# Patient Record
Sex: Male | Born: 1955
Health system: Southern US, Community
[De-identification: ages and names within clinical notes are randomized; demographics above are authoritative.]

## PROBLEM LIST (undated history)

## (undated) DIAGNOSIS — I5022 Chronic systolic (congestive) heart failure: Secondary | ICD-10-CM

## (undated) DIAGNOSIS — I252 Old myocardial infarction: Secondary | ICD-10-CM

## (undated) DIAGNOSIS — E785 Hyperlipidemia, unspecified: Secondary | ICD-10-CM

## (undated) DIAGNOSIS — I4891 Unspecified atrial fibrillation: Secondary | ICD-10-CM

## (undated) DIAGNOSIS — J449 Chronic obstructive pulmonary disease, unspecified: Secondary | ICD-10-CM

## (undated) DIAGNOSIS — E039 Hypothyroidism, unspecified: Secondary | ICD-10-CM

## (undated) DIAGNOSIS — I10 Essential (primary) hypertension: Secondary | ICD-10-CM

## (undated) DIAGNOSIS — I2510 Atherosclerotic heart disease of native coronary artery without angina pectoris: Secondary | ICD-10-CM

## (undated) DIAGNOSIS — Z95810 Presence of automatic (implantable) cardiac defibrillator: Secondary | ICD-10-CM

## (undated) DIAGNOSIS — G4733 Obstructive sleep apnea (adult) (pediatric): Secondary | ICD-10-CM

## (undated) DIAGNOSIS — I509 Heart failure, unspecified: Secondary | ICD-10-CM

## (undated) DIAGNOSIS — Z8701 Personal history of pneumonia (recurrent): Secondary | ICD-10-CM

## (undated) HISTORY — DX: Chronic systolic (congestive) heart failure: I50.22

## (undated) HISTORY — DX: Old myocardial infarction: I25.2

## (undated) HISTORY — DX: Atherosclerotic heart disease of native coronary artery without angina pectoris: I25.10

## (undated) HISTORY — DX: Personal history of pneumonia (recurrent): Z87.01

## (undated) HISTORY — DX: Obstructive sleep apnea (adult) (pediatric): G47.33

## (undated) HISTORY — DX: Hyperlipidemia, unspecified: E78.5

---

## 1982-08-16 HISTORY — PX: FACIAL RECONSTRUCTION SURGERY: SHX631

## 1982-08-16 HISTORY — PX: FEMUR FRACTURE SURGERY: SHX633

## 2012-08-16 DIAGNOSIS — Z95810 Presence of automatic (implantable) cardiac defibrillator: Secondary | ICD-10-CM

## 2012-08-16 HISTORY — DX: Presence of automatic (implantable) cardiac defibrillator: Z95.810

## 2012-08-16 HISTORY — PX: CARDIAC DEFIBRILLATOR PLACEMENT: SHX171

## 2013-08-16 HISTORY — PX: WRIST FRACTURE SURGERY: SHX121

## 2015-10-12 DIAGNOSIS — I10 Essential (primary) hypertension: Secondary | ICD-10-CM | POA: Diagnosis not present

## 2015-10-12 DIAGNOSIS — E039 Hypothyroidism, unspecified: Secondary | ICD-10-CM | POA: Diagnosis not present

## 2015-10-12 DIAGNOSIS — I2510 Atherosclerotic heart disease of native coronary artery without angina pectoris: Secondary | ICD-10-CM | POA: Diagnosis not present

## 2015-10-12 DIAGNOSIS — I509 Heart failure, unspecified: Secondary | ICD-10-CM | POA: Diagnosis not present

## 2015-10-13 DIAGNOSIS — E039 Hypothyroidism, unspecified: Secondary | ICD-10-CM | POA: Diagnosis not present

## 2015-10-13 DIAGNOSIS — I2510 Atherosclerotic heart disease of native coronary artery without angina pectoris: Secondary | ICD-10-CM | POA: Diagnosis not present

## 2015-10-13 DIAGNOSIS — I509 Heart failure, unspecified: Secondary | ICD-10-CM | POA: Diagnosis not present

## 2015-10-13 DIAGNOSIS — I10 Essential (primary) hypertension: Secondary | ICD-10-CM | POA: Diagnosis not present

## 2016-02-09 DIAGNOSIS — I10 Essential (primary) hypertension: Secondary | ICD-10-CM | POA: Diagnosis not present

## 2016-02-09 DIAGNOSIS — I255 Ischemic cardiomyopathy: Secondary | ICD-10-CM | POA: Diagnosis not present

## 2016-02-09 DIAGNOSIS — R0609 Other forms of dyspnea: Secondary | ICD-10-CM | POA: Diagnosis not present

## 2016-05-08 DIAGNOSIS — I255 Ischemic cardiomyopathy: Secondary | ICD-10-CM | POA: Diagnosis not present

## 2016-05-08 DIAGNOSIS — I2510 Atherosclerotic heart disease of native coronary artery without angina pectoris: Secondary | ICD-10-CM | POA: Diagnosis not present

## 2016-05-08 DIAGNOSIS — B182 Chronic viral hepatitis C: Secondary | ICD-10-CM | POA: Diagnosis present

## 2016-05-08 DIAGNOSIS — I10 Essential (primary) hypertension: Secondary | ICD-10-CM | POA: Diagnosis not present

## 2016-05-08 DIAGNOSIS — I371 Nonrheumatic pulmonary valve insufficiency: Secondary | ICD-10-CM | POA: Diagnosis not present

## 2016-05-08 DIAGNOSIS — Z95810 Presence of automatic (implantable) cardiac defibrillator: Secondary | ICD-10-CM | POA: Diagnosis not present

## 2016-05-08 DIAGNOSIS — Z8673 Personal history of transient ischemic attack (TIA), and cerebral infarction without residual deficits: Secondary | ICD-10-CM | POA: Diagnosis not present

## 2016-05-08 DIAGNOSIS — E039 Hypothyroidism, unspecified: Secondary | ICD-10-CM | POA: Diagnosis not present

## 2016-05-08 DIAGNOSIS — Z885 Allergy status to narcotic agent status: Secondary | ICD-10-CM | POA: Diagnosis not present

## 2016-05-08 DIAGNOSIS — I509 Heart failure, unspecified: Secondary | ICD-10-CM | POA: Diagnosis not present

## 2016-05-08 DIAGNOSIS — Z955 Presence of coronary angioplasty implant and graft: Secondary | ICD-10-CM | POA: Diagnosis not present

## 2016-05-08 DIAGNOSIS — I252 Old myocardial infarction: Secondary | ICD-10-CM | POA: Diagnosis not present

## 2016-05-08 DIAGNOSIS — I361 Nonrheumatic tricuspid (valve) insufficiency: Secondary | ICD-10-CM | POA: Diagnosis not present

## 2016-05-08 DIAGNOSIS — K219 Gastro-esophageal reflux disease without esophagitis: Secondary | ICD-10-CM | POA: Diagnosis not present

## 2016-05-08 DIAGNOSIS — Z87891 Personal history of nicotine dependence: Secondary | ICD-10-CM | POA: Diagnosis not present

## 2016-05-08 DIAGNOSIS — E785 Hyperlipidemia, unspecified: Secondary | ICD-10-CM | POA: Diagnosis not present

## 2016-05-08 DIAGNOSIS — I160 Hypertensive urgency: Secondary | ICD-10-CM | POA: Diagnosis not present

## 2016-05-08 DIAGNOSIS — I340 Nonrheumatic mitral (valve) insufficiency: Secondary | ICD-10-CM | POA: Diagnosis not present

## 2016-05-08 DIAGNOSIS — R0600 Dyspnea, unspecified: Secondary | ICD-10-CM | POA: Diagnosis not present

## 2016-05-08 DIAGNOSIS — I351 Nonrheumatic aortic (valve) insufficiency: Secondary | ICD-10-CM | POA: Diagnosis not present

## 2016-05-08 DIAGNOSIS — F419 Anxiety disorder, unspecified: Secondary | ICD-10-CM | POA: Diagnosis present

## 2016-05-08 DIAGNOSIS — F329 Major depressive disorder, single episode, unspecified: Secondary | ICD-10-CM | POA: Diagnosis present

## 2016-05-08 DIAGNOSIS — Z888 Allergy status to other drugs, medicaments and biological substances status: Secondary | ICD-10-CM | POA: Diagnosis not present

## 2016-05-08 DIAGNOSIS — I110 Hypertensive heart disease with heart failure: Secondary | ICD-10-CM | POA: Diagnosis not present

## 2016-05-08 DIAGNOSIS — I952 Hypotension due to drugs: Secondary | ICD-10-CM | POA: Diagnosis not present

## 2016-05-08 DIAGNOSIS — T447X5A Adverse effect of beta-adrenoreceptor antagonists, initial encounter: Secondary | ICD-10-CM | POA: Diagnosis not present

## 2016-05-08 DIAGNOSIS — I5023 Acute on chronic systolic (congestive) heart failure: Secondary | ICD-10-CM | POA: Diagnosis not present

## 2016-05-17 DIAGNOSIS — I5020 Unspecified systolic (congestive) heart failure: Secondary | ICD-10-CM | POA: Diagnosis not present

## 2016-05-17 DIAGNOSIS — I10 Essential (primary) hypertension: Secondary | ICD-10-CM | POA: Diagnosis not present

## 2016-05-17 DIAGNOSIS — Z955 Presence of coronary angioplasty implant and graft: Secondary | ICD-10-CM | POA: Diagnosis not present

## 2016-05-17 DIAGNOSIS — I255 Ischemic cardiomyopathy: Secondary | ICD-10-CM | POA: Diagnosis not present

## 2016-05-26 DIAGNOSIS — I10 Essential (primary) hypertension: Secondary | ICD-10-CM | POA: Diagnosis not present

## 2016-05-26 DIAGNOSIS — I5020 Unspecified systolic (congestive) heart failure: Secondary | ICD-10-CM | POA: Diagnosis not present

## 2016-05-26 DIAGNOSIS — I255 Ischemic cardiomyopathy: Secondary | ICD-10-CM | POA: Diagnosis not present

## 2016-05-26 DIAGNOSIS — Z955 Presence of coronary angioplasty implant and graft: Secondary | ICD-10-CM | POA: Diagnosis not present

## 2016-06-02 DIAGNOSIS — I5020 Unspecified systolic (congestive) heart failure: Secondary | ICD-10-CM | POA: Diagnosis not present

## 2016-06-02 DIAGNOSIS — I2510 Atherosclerotic heart disease of native coronary artery without angina pectoris: Secondary | ICD-10-CM | POA: Diagnosis not present

## 2016-06-02 DIAGNOSIS — I10 Essential (primary) hypertension: Secondary | ICD-10-CM | POA: Diagnosis not present

## 2016-06-02 DIAGNOSIS — I255 Ischemic cardiomyopathy: Secondary | ICD-10-CM | POA: Diagnosis not present

## 2016-06-10 DIAGNOSIS — Z955 Presence of coronary angioplasty implant and graft: Secondary | ICD-10-CM | POA: Diagnosis not present

## 2016-06-10 DIAGNOSIS — I5020 Unspecified systolic (congestive) heart failure: Secondary | ICD-10-CM | POA: Diagnosis not present

## 2016-06-10 DIAGNOSIS — I10 Essential (primary) hypertension: Secondary | ICD-10-CM | POA: Diagnosis not present

## 2016-06-10 DIAGNOSIS — I255 Ischemic cardiomyopathy: Secondary | ICD-10-CM | POA: Diagnosis not present

## 2016-06-16 DIAGNOSIS — E782 Mixed hyperlipidemia: Secondary | ICD-10-CM | POA: Diagnosis not present

## 2016-06-16 DIAGNOSIS — I255 Ischemic cardiomyopathy: Secondary | ICD-10-CM | POA: Diagnosis not present

## 2016-06-16 DIAGNOSIS — I5020 Unspecified systolic (congestive) heart failure: Secondary | ICD-10-CM | POA: Diagnosis not present

## 2016-06-16 DIAGNOSIS — I10 Essential (primary) hypertension: Secondary | ICD-10-CM | POA: Diagnosis not present

## 2016-07-03 DIAGNOSIS — Z95810 Presence of automatic (implantable) cardiac defibrillator: Secondary | ICD-10-CM | POA: Diagnosis not present

## 2016-07-03 DIAGNOSIS — R5381 Other malaise: Secondary | ICD-10-CM | POA: Diagnosis present

## 2016-07-03 DIAGNOSIS — I255 Ischemic cardiomyopathy: Secondary | ICD-10-CM | POA: Diagnosis present

## 2016-07-03 DIAGNOSIS — E785 Hyperlipidemia, unspecified: Secondary | ICD-10-CM | POA: Diagnosis present

## 2016-07-03 DIAGNOSIS — Z9889 Other specified postprocedural states: Secondary | ICD-10-CM | POA: Diagnosis not present

## 2016-07-03 DIAGNOSIS — I10 Essential (primary) hypertension: Secondary | ICD-10-CM | POA: Diagnosis not present

## 2016-07-03 DIAGNOSIS — I455 Other specified heart block: Secondary | ICD-10-CM | POA: Diagnosis not present

## 2016-07-03 DIAGNOSIS — J9601 Acute respiratory failure with hypoxia: Secondary | ICD-10-CM | POA: Diagnosis not present

## 2016-07-03 DIAGNOSIS — I160 Hypertensive urgency: Secondary | ICD-10-CM | POA: Diagnosis present

## 2016-07-03 DIAGNOSIS — Z885 Allergy status to narcotic agent status: Secondary | ICD-10-CM | POA: Diagnosis not present

## 2016-07-03 DIAGNOSIS — N179 Acute kidney failure, unspecified: Secondary | ICD-10-CM | POA: Diagnosis present

## 2016-07-03 DIAGNOSIS — R531 Weakness: Secondary | ICD-10-CM | POA: Diagnosis not present

## 2016-07-03 DIAGNOSIS — I2510 Atherosclerotic heart disease of native coronary artery without angina pectoris: Secondary | ICD-10-CM | POA: Diagnosis present

## 2016-07-03 DIAGNOSIS — E46 Unspecified protein-calorie malnutrition: Secondary | ICD-10-CM | POA: Diagnosis present

## 2016-07-03 DIAGNOSIS — E875 Hyperkalemia: Secondary | ICD-10-CM | POA: Diagnosis not present

## 2016-07-03 DIAGNOSIS — Z6827 Body mass index (BMI) 27.0-27.9, adult: Secondary | ICD-10-CM | POA: Diagnosis not present

## 2016-07-03 DIAGNOSIS — F419 Anxiety disorder, unspecified: Secondary | ICD-10-CM | POA: Diagnosis present

## 2016-07-03 DIAGNOSIS — E876 Hypokalemia: Secondary | ICD-10-CM | POA: Diagnosis not present

## 2016-07-03 DIAGNOSIS — I5020 Unspecified systolic (congestive) heart failure: Secondary | ICD-10-CM | POA: Diagnosis not present

## 2016-07-03 DIAGNOSIS — E871 Hypo-osmolality and hyponatremia: Secondary | ICD-10-CM | POA: Diagnosis present

## 2016-07-03 DIAGNOSIS — I5021 Acute systolic (congestive) heart failure: Secondary | ICD-10-CM | POA: Diagnosis not present

## 2016-07-03 DIAGNOSIS — I130 Hypertensive heart and chronic kidney disease with heart failure and stage 1 through stage 4 chronic kidney disease, or unspecified chronic kidney disease: Secondary | ICD-10-CM | POA: Diagnosis present

## 2016-07-03 DIAGNOSIS — I252 Old myocardial infarction: Secondary | ICD-10-CM | POA: Diagnosis not present

## 2016-07-03 DIAGNOSIS — F329 Major depressive disorder, single episode, unspecified: Secondary | ICD-10-CM | POA: Diagnosis present

## 2016-07-03 DIAGNOSIS — K219 Gastro-esophageal reflux disease without esophagitis: Secondary | ICD-10-CM | POA: Diagnosis present

## 2016-07-03 DIAGNOSIS — N183 Chronic kidney disease, stage 3 (moderate): Secondary | ICD-10-CM | POA: Diagnosis present

## 2016-07-03 DIAGNOSIS — B182 Chronic viral hepatitis C: Secondary | ICD-10-CM | POA: Diagnosis present

## 2016-07-03 DIAGNOSIS — E039 Hypothyroidism, unspecified: Secondary | ICD-10-CM | POA: Diagnosis present

## 2016-07-03 DIAGNOSIS — Z87891 Personal history of nicotine dependence: Secondary | ICD-10-CM | POA: Diagnosis not present

## 2016-07-03 DIAGNOSIS — R079 Chest pain, unspecified: Secondary | ICD-10-CM | POA: Diagnosis not present

## 2016-07-03 DIAGNOSIS — I5023 Acute on chronic systolic (congestive) heart failure: Secondary | ICD-10-CM | POA: Diagnosis not present

## 2016-07-03 DIAGNOSIS — E872 Acidosis: Secondary | ICD-10-CM | POA: Diagnosis not present

## 2016-07-16 DIAGNOSIS — I255 Ischemic cardiomyopathy: Secondary | ICD-10-CM | POA: Diagnosis not present

## 2016-07-16 DIAGNOSIS — I2510 Atherosclerotic heart disease of native coronary artery without angina pectoris: Secondary | ICD-10-CM | POA: Diagnosis not present

## 2016-07-16 DIAGNOSIS — I5020 Unspecified systolic (congestive) heart failure: Secondary | ICD-10-CM | POA: Diagnosis not present

## 2016-07-16 DIAGNOSIS — I10 Essential (primary) hypertension: Secondary | ICD-10-CM | POA: Diagnosis not present

## 2016-07-28 DIAGNOSIS — I255 Ischemic cardiomyopathy: Secondary | ICD-10-CM | POA: Diagnosis not present

## 2016-07-28 DIAGNOSIS — I2510 Atherosclerotic heart disease of native coronary artery without angina pectoris: Secondary | ICD-10-CM | POA: Diagnosis not present

## 2016-07-28 DIAGNOSIS — I10 Essential (primary) hypertension: Secondary | ICD-10-CM | POA: Diagnosis not present

## 2016-08-27 DIAGNOSIS — I255 Ischemic cardiomyopathy: Secondary | ICD-10-CM | POA: Diagnosis not present

## 2016-08-27 DIAGNOSIS — I10 Essential (primary) hypertension: Secondary | ICD-10-CM | POA: Diagnosis not present

## 2016-08-27 DIAGNOSIS — I5020 Unspecified systolic (congestive) heart failure: Secondary | ICD-10-CM | POA: Diagnosis not present

## 2016-10-10 DIAGNOSIS — E785 Hyperlipidemia, unspecified: Secondary | ICD-10-CM | POA: Diagnosis not present

## 2016-10-10 DIAGNOSIS — Z955 Presence of coronary angioplasty implant and graft: Secondary | ICD-10-CM | POA: Diagnosis not present

## 2016-10-10 DIAGNOSIS — I110 Hypertensive heart disease with heart failure: Secondary | ICD-10-CM | POA: Diagnosis present

## 2016-10-10 DIAGNOSIS — Z87898 Personal history of other specified conditions: Secondary | ICD-10-CM | POA: Diagnosis not present

## 2016-10-10 DIAGNOSIS — Z9889 Other specified postprocedural states: Secondary | ICD-10-CM | POA: Diagnosis not present

## 2016-10-10 DIAGNOSIS — I255 Ischemic cardiomyopathy: Secondary | ICD-10-CM | POA: Diagnosis present

## 2016-10-10 DIAGNOSIS — I509 Heart failure, unspecified: Secondary | ICD-10-CM | POA: Diagnosis not present

## 2016-10-10 DIAGNOSIS — F329 Major depressive disorder, single episode, unspecified: Secondary | ICD-10-CM | POA: Diagnosis not present

## 2016-10-10 DIAGNOSIS — I10 Essential (primary) hypertension: Secondary | ICD-10-CM | POA: Diagnosis not present

## 2016-10-10 DIAGNOSIS — E872 Acidosis: Secondary | ICD-10-CM | POA: Diagnosis not present

## 2016-10-10 DIAGNOSIS — D1809 Hemangioma of other sites: Secondary | ICD-10-CM | POA: Diagnosis present

## 2016-10-10 DIAGNOSIS — R0602 Shortness of breath: Secondary | ICD-10-CM | POA: Diagnosis not present

## 2016-10-10 DIAGNOSIS — I2510 Atherosclerotic heart disease of native coronary artery without angina pectoris: Secondary | ICD-10-CM | POA: Diagnosis not present

## 2016-10-10 DIAGNOSIS — Z885 Allergy status to narcotic agent status: Secondary | ICD-10-CM | POA: Diagnosis not present

## 2016-10-10 DIAGNOSIS — K219 Gastro-esophageal reflux disease without esophagitis: Secondary | ICD-10-CM | POA: Diagnosis present

## 2016-10-10 DIAGNOSIS — Z87891 Personal history of nicotine dependence: Secondary | ICD-10-CM | POA: Diagnosis not present

## 2016-10-10 DIAGNOSIS — Z950 Presence of cardiac pacemaker: Secondary | ICD-10-CM | POA: Diagnosis not present

## 2016-10-10 DIAGNOSIS — Z9114 Patient's other noncompliance with medication regimen: Secondary | ICD-10-CM | POA: Diagnosis not present

## 2016-10-10 DIAGNOSIS — B182 Chronic viral hepatitis C: Secondary | ICD-10-CM | POA: Diagnosis present

## 2016-10-10 DIAGNOSIS — F419 Anxiety disorder, unspecified: Secondary | ICD-10-CM | POA: Diagnosis present

## 2016-10-10 DIAGNOSIS — E039 Hypothyroidism, unspecified: Secondary | ICD-10-CM | POA: Diagnosis present

## 2016-10-10 DIAGNOSIS — I5023 Acute on chronic systolic (congestive) heart failure: Secondary | ICD-10-CM | POA: Diagnosis not present

## 2016-10-22 DIAGNOSIS — I255 Ischemic cardiomyopathy: Secondary | ICD-10-CM | POA: Diagnosis not present

## 2016-10-22 DIAGNOSIS — I5020 Unspecified systolic (congestive) heart failure: Secondary | ICD-10-CM | POA: Diagnosis not present

## 2016-10-22 DIAGNOSIS — I440 Atrioventricular block, first degree: Secondary | ICD-10-CM | POA: Diagnosis not present

## 2016-10-22 DIAGNOSIS — R9431 Abnormal electrocardiogram [ECG] [EKG]: Secondary | ICD-10-CM | POA: Diagnosis not present

## 2016-10-22 DIAGNOSIS — I10 Essential (primary) hypertension: Secondary | ICD-10-CM | POA: Diagnosis not present

## 2017-01-07 DIAGNOSIS — Z7982 Long term (current) use of aspirin: Secondary | ICD-10-CM | POA: Diagnosis not present

## 2017-01-07 DIAGNOSIS — Z95810 Presence of automatic (implantable) cardiac defibrillator: Secondary | ICD-10-CM | POA: Diagnosis not present

## 2017-01-07 DIAGNOSIS — Z886 Allergy status to analgesic agent status: Secondary | ICD-10-CM | POA: Diagnosis not present

## 2017-01-07 DIAGNOSIS — I5020 Unspecified systolic (congestive) heart failure: Secondary | ICD-10-CM | POA: Diagnosis not present

## 2017-01-07 DIAGNOSIS — Z9119 Patient's noncompliance with other medical treatment and regimen: Secondary | ICD-10-CM | POA: Diagnosis not present

## 2017-01-07 DIAGNOSIS — E785 Hyperlipidemia, unspecified: Secondary | ICD-10-CM | POA: Diagnosis not present

## 2017-01-07 DIAGNOSIS — I5021 Acute systolic (congestive) heart failure: Secondary | ICD-10-CM | POA: Diagnosis not present

## 2017-01-07 DIAGNOSIS — I255 Ischemic cardiomyopathy: Secondary | ICD-10-CM | POA: Diagnosis not present

## 2017-01-07 DIAGNOSIS — K219 Gastro-esophageal reflux disease without esophagitis: Secondary | ICD-10-CM | POA: Diagnosis not present

## 2017-01-07 DIAGNOSIS — I110 Hypertensive heart disease with heart failure: Secondary | ICD-10-CM | POA: Diagnosis not present

## 2017-01-07 DIAGNOSIS — E872 Acidosis: Secondary | ICD-10-CM | POA: Diagnosis not present

## 2017-01-07 DIAGNOSIS — R0602 Shortness of breath: Secondary | ICD-10-CM | POA: Diagnosis not present

## 2017-01-07 DIAGNOSIS — J9621 Acute and chronic respiratory failure with hypoxia: Secondary | ICD-10-CM | POA: Diagnosis not present

## 2017-01-07 DIAGNOSIS — Z79899 Other long term (current) drug therapy: Secondary | ICD-10-CM | POA: Diagnosis not present

## 2017-01-07 DIAGNOSIS — I2510 Atherosclerotic heart disease of native coronary artery without angina pectoris: Secondary | ICD-10-CM | POA: Diagnosis not present

## 2017-01-07 DIAGNOSIS — E039 Hypothyroidism, unspecified: Secondary | ICD-10-CM | POA: Diagnosis not present

## 2017-01-07 DIAGNOSIS — Z8619 Personal history of other infectious and parasitic diseases: Secondary | ICD-10-CM | POA: Diagnosis not present

## 2017-01-07 DIAGNOSIS — Z87891 Personal history of nicotine dependence: Secondary | ICD-10-CM | POA: Diagnosis not present

## 2017-01-07 DIAGNOSIS — R0600 Dyspnea, unspecified: Secondary | ICD-10-CM | POA: Diagnosis not present

## 2017-01-07 DIAGNOSIS — N179 Acute kidney failure, unspecified: Secondary | ICD-10-CM | POA: Diagnosis not present

## 2017-01-08 DIAGNOSIS — I5020 Unspecified systolic (congestive) heart failure: Secondary | ICD-10-CM | POA: Diagnosis not present

## 2017-01-08 DIAGNOSIS — R0600 Dyspnea, unspecified: Secondary | ICD-10-CM | POA: Diagnosis not present

## 2017-01-08 DIAGNOSIS — I255 Ischemic cardiomyopathy: Secondary | ICD-10-CM | POA: Diagnosis not present

## 2017-01-11 DIAGNOSIS — E860 Dehydration: Secondary | ICD-10-CM | POA: Diagnosis not present

## 2017-01-11 DIAGNOSIS — F329 Major depressive disorder, single episode, unspecified: Secondary | ICD-10-CM | POA: Diagnosis present

## 2017-01-11 DIAGNOSIS — I252 Old myocardial infarction: Secondary | ICD-10-CM | POA: Diagnosis not present

## 2017-01-11 DIAGNOSIS — N289 Disorder of kidney and ureter, unspecified: Secondary | ICD-10-CM | POA: Diagnosis not present

## 2017-01-11 DIAGNOSIS — I5023 Acute on chronic systolic (congestive) heart failure: Secondary | ICD-10-CM | POA: Diagnosis not present

## 2017-01-11 DIAGNOSIS — R262 Difficulty in walking, not elsewhere classified: Secondary | ICD-10-CM | POA: Diagnosis not present

## 2017-01-11 DIAGNOSIS — E875 Hyperkalemia: Secondary | ICD-10-CM | POA: Diagnosis not present

## 2017-01-11 DIAGNOSIS — N179 Acute kidney failure, unspecified: Secondary | ICD-10-CM | POA: Diagnosis present

## 2017-01-11 DIAGNOSIS — Z886 Allergy status to analgesic agent status: Secondary | ICD-10-CM | POA: Diagnosis not present

## 2017-01-11 DIAGNOSIS — I2510 Atherosclerotic heart disease of native coronary artery without angina pectoris: Secondary | ICD-10-CM | POA: Diagnosis not present

## 2017-01-11 DIAGNOSIS — E162 Hypoglycemia, unspecified: Secondary | ICD-10-CM | POA: Diagnosis not present

## 2017-01-11 DIAGNOSIS — J440 Chronic obstructive pulmonary disease with acute lower respiratory infection: Secondary | ICD-10-CM | POA: Diagnosis not present

## 2017-01-11 DIAGNOSIS — R0602 Shortness of breath: Secondary | ICD-10-CM | POA: Diagnosis not present

## 2017-01-11 DIAGNOSIS — I10 Essential (primary) hypertension: Secondary | ICD-10-CM | POA: Diagnosis not present

## 2017-01-11 DIAGNOSIS — E872 Acidosis: Secondary | ICD-10-CM | POA: Diagnosis not present

## 2017-01-11 DIAGNOSIS — R0902 Hypoxemia: Secondary | ICD-10-CM | POA: Diagnosis present

## 2017-01-11 DIAGNOSIS — Z7982 Long term (current) use of aspirin: Secondary | ICD-10-CM | POA: Diagnosis not present

## 2017-01-11 DIAGNOSIS — Z955 Presence of coronary angioplasty implant and graft: Secondary | ICD-10-CM | POA: Diagnosis not present

## 2017-01-11 DIAGNOSIS — J209 Acute bronchitis, unspecified: Secondary | ICD-10-CM | POA: Diagnosis not present

## 2017-01-11 DIAGNOSIS — M6281 Muscle weakness (generalized): Secondary | ICD-10-CM | POA: Diagnosis not present

## 2017-01-11 DIAGNOSIS — E7800 Pure hypercholesterolemia, unspecified: Secondary | ICD-10-CM | POA: Diagnosis present

## 2017-01-11 DIAGNOSIS — I5021 Acute systolic (congestive) heart failure: Secondary | ICD-10-CM | POA: Diagnosis present

## 2017-01-11 DIAGNOSIS — K219 Gastro-esophageal reflux disease without esophagitis: Secondary | ICD-10-CM | POA: Diagnosis present

## 2017-01-11 DIAGNOSIS — Z950 Presence of cardiac pacemaker: Secondary | ICD-10-CM | POA: Diagnosis not present

## 2017-01-11 DIAGNOSIS — E876 Hypokalemia: Secondary | ICD-10-CM | POA: Diagnosis not present

## 2017-01-11 DIAGNOSIS — J449 Chronic obstructive pulmonary disease, unspecified: Secondary | ICD-10-CM | POA: Diagnosis present

## 2017-01-11 DIAGNOSIS — Z7902 Long term (current) use of antithrombotics/antiplatelets: Secondary | ICD-10-CM | POA: Diagnosis not present

## 2017-01-11 DIAGNOSIS — R740 Nonspecific elevation of levels of transaminase and lactic acid dehydrogenase [LDH]: Secondary | ICD-10-CM | POA: Diagnosis not present

## 2017-01-11 DIAGNOSIS — R748 Abnormal levels of other serum enzymes: Secondary | ICD-10-CM | POA: Diagnosis present

## 2017-01-11 DIAGNOSIS — S52601A Unspecified fracture of lower end of right ulna, initial encounter for closed fracture: Secondary | ICD-10-CM | POA: Diagnosis not present

## 2017-01-11 DIAGNOSIS — I209 Angina pectoris, unspecified: Secondary | ICD-10-CM | POA: Diagnosis not present

## 2017-01-11 DIAGNOSIS — E539 Vitamin B deficiency, unspecified: Secondary | ICD-10-CM | POA: Diagnosis not present

## 2017-01-11 DIAGNOSIS — R531 Weakness: Secondary | ICD-10-CM | POA: Diagnosis not present

## 2017-01-11 DIAGNOSIS — D649 Anemia, unspecified: Secondary | ICD-10-CM | POA: Diagnosis present

## 2017-01-11 DIAGNOSIS — R0682 Tachypnea, not elsewhere classified: Secondary | ICD-10-CM | POA: Diagnosis not present

## 2017-01-11 DIAGNOSIS — F339 Major depressive disorder, recurrent, unspecified: Secondary | ICD-10-CM | POA: Diagnosis not present

## 2017-01-11 DIAGNOSIS — Z87891 Personal history of nicotine dependence: Secondary | ICD-10-CM | POA: Diagnosis not present

## 2017-01-11 DIAGNOSIS — F419 Anxiety disorder, unspecified: Secondary | ICD-10-CM | POA: Diagnosis present

## 2017-01-11 DIAGNOSIS — Z79899 Other long term (current) drug therapy: Secondary | ICD-10-CM | POA: Diagnosis not present

## 2017-01-11 DIAGNOSIS — E039 Hypothyroidism, unspecified: Secondary | ICD-10-CM | POA: Diagnosis present

## 2017-01-14 DIAGNOSIS — R079 Chest pain, unspecified: Secondary | ICD-10-CM | POA: Diagnosis not present

## 2017-01-14 DIAGNOSIS — I110 Hypertensive heart disease with heart failure: Secondary | ICD-10-CM | POA: Diagnosis present

## 2017-01-14 DIAGNOSIS — R109 Unspecified abdominal pain: Secondary | ICD-10-CM | POA: Diagnosis present

## 2017-01-14 DIAGNOSIS — Z885 Allergy status to narcotic agent status: Secondary | ICD-10-CM | POA: Diagnosis not present

## 2017-01-14 DIAGNOSIS — R1084 Generalized abdominal pain: Secondary | ICD-10-CM | POA: Diagnosis not present

## 2017-01-14 DIAGNOSIS — F419 Anxiety disorder, unspecified: Secondary | ICD-10-CM | POA: Diagnosis not present

## 2017-01-14 DIAGNOSIS — E038 Other specified hypothyroidism: Secondary | ICD-10-CM | POA: Diagnosis not present

## 2017-01-14 DIAGNOSIS — I209 Angina pectoris, unspecified: Secondary | ICD-10-CM | POA: Diagnosis not present

## 2017-01-14 DIAGNOSIS — Z87891 Personal history of nicotine dependence: Secondary | ICD-10-CM | POA: Diagnosis not present

## 2017-01-14 DIAGNOSIS — Z7982 Long term (current) use of aspirin: Secondary | ICD-10-CM | POA: Diagnosis not present

## 2017-01-14 DIAGNOSIS — E039 Hypothyroidism, unspecified: Secondary | ICD-10-CM | POA: Diagnosis present

## 2017-01-14 DIAGNOSIS — R262 Difficulty in walking, not elsewhere classified: Secondary | ICD-10-CM | POA: Diagnosis not present

## 2017-01-14 DIAGNOSIS — I10 Essential (primary) hypertension: Secondary | ICD-10-CM | POA: Diagnosis not present

## 2017-01-14 DIAGNOSIS — I255 Ischemic cardiomyopathy: Secondary | ICD-10-CM | POA: Diagnosis present

## 2017-01-14 DIAGNOSIS — I2510 Atherosclerotic heart disease of native coronary artery without angina pectoris: Secondary | ICD-10-CM | POA: Diagnosis present

## 2017-01-14 DIAGNOSIS — I509 Heart failure, unspecified: Secondary | ICD-10-CM | POA: Diagnosis not present

## 2017-01-14 DIAGNOSIS — I440 Atrioventricular block, first degree: Secondary | ICD-10-CM | POA: Diagnosis present

## 2017-01-14 DIAGNOSIS — J449 Chronic obstructive pulmonary disease, unspecified: Secondary | ICD-10-CM | POA: Diagnosis not present

## 2017-01-14 DIAGNOSIS — E539 Vitamin B deficiency, unspecified: Secondary | ICD-10-CM | POA: Diagnosis not present

## 2017-01-14 DIAGNOSIS — E876 Hypokalemia: Secondary | ICD-10-CM | POA: Diagnosis not present

## 2017-01-14 DIAGNOSIS — F339 Major depressive disorder, recurrent, unspecified: Secondary | ICD-10-CM | POA: Diagnosis not present

## 2017-01-14 DIAGNOSIS — S52601A Unspecified fracture of lower end of right ulna, initial encounter for closed fracture: Secondary | ICD-10-CM | POA: Diagnosis not present

## 2017-01-14 DIAGNOSIS — I5021 Acute systolic (congestive) heart failure: Secondary | ICD-10-CM | POA: Diagnosis not present

## 2017-01-14 DIAGNOSIS — J209 Acute bronchitis, unspecified: Secondary | ICD-10-CM | POA: Diagnosis not present

## 2017-01-14 DIAGNOSIS — Z950 Presence of cardiac pacemaker: Secondary | ICD-10-CM | POA: Diagnosis not present

## 2017-01-14 DIAGNOSIS — K219 Gastro-esophageal reflux disease without esophagitis: Secondary | ICD-10-CM | POA: Diagnosis present

## 2017-01-14 DIAGNOSIS — Z9119 Patient's noncompliance with other medical treatment and regimen: Secondary | ICD-10-CM | POA: Diagnosis not present

## 2017-01-14 DIAGNOSIS — I5023 Acute on chronic systolic (congestive) heart failure: Secondary | ICD-10-CM | POA: Diagnosis present

## 2017-01-14 DIAGNOSIS — D638 Anemia in other chronic diseases classified elsewhere: Secondary | ICD-10-CM | POA: Diagnosis present

## 2017-01-14 DIAGNOSIS — R0902 Hypoxemia: Secondary | ICD-10-CM | POA: Diagnosis not present

## 2017-01-14 DIAGNOSIS — R072 Precordial pain: Secondary | ICD-10-CM | POA: Diagnosis not present

## 2017-01-14 DIAGNOSIS — M6281 Muscle weakness (generalized): Secondary | ICD-10-CM | POA: Diagnosis not present

## 2017-01-14 DIAGNOSIS — Z8619 Personal history of other infectious and parasitic diseases: Secondary | ICD-10-CM | POA: Diagnosis not present

## 2017-01-14 DIAGNOSIS — R531 Weakness: Secondary | ICD-10-CM | POA: Diagnosis present

## 2017-01-14 DIAGNOSIS — J9621 Acute and chronic respiratory failure with hypoxia: Secondary | ICD-10-CM | POA: Diagnosis present

## 2017-01-14 DIAGNOSIS — E162 Hypoglycemia, unspecified: Secondary | ICD-10-CM | POA: Diagnosis present

## 2017-01-14 DIAGNOSIS — Z95810 Presence of automatic (implantable) cardiac defibrillator: Secondary | ICD-10-CM | POA: Diagnosis not present

## 2017-01-14 DIAGNOSIS — E872 Acidosis: Secondary | ICD-10-CM | POA: Diagnosis present

## 2017-01-14 DIAGNOSIS — R0600 Dyspnea, unspecified: Secondary | ICD-10-CM | POA: Diagnosis not present

## 2017-01-14 DIAGNOSIS — E871 Hypo-osmolality and hyponatremia: Secondary | ICD-10-CM | POA: Diagnosis not present

## 2017-01-14 DIAGNOSIS — E063 Autoimmune thyroiditis: Secondary | ICD-10-CM | POA: Diagnosis not present

## 2017-01-14 DIAGNOSIS — E785 Hyperlipidemia, unspecified: Secondary | ICD-10-CM | POA: Diagnosis present

## 2017-01-14 DIAGNOSIS — J440 Chronic obstructive pulmonary disease with acute lower respiratory infection: Secondary | ICD-10-CM | POA: Diagnosis not present

## 2017-01-14 DIAGNOSIS — R740 Nonspecific elevation of levels of transaminase and lactic acid dehydrogenase [LDH]: Secondary | ICD-10-CM | POA: Diagnosis present

## 2017-01-17 DIAGNOSIS — E038 Other specified hypothyroidism: Secondary | ICD-10-CM | POA: Diagnosis not present

## 2017-01-17 DIAGNOSIS — E063 Autoimmune thyroiditis: Secondary | ICD-10-CM | POA: Diagnosis not present

## 2017-01-17 DIAGNOSIS — I5023 Acute on chronic systolic (congestive) heart failure: Secondary | ICD-10-CM | POA: Diagnosis not present

## 2017-01-17 DIAGNOSIS — E162 Hypoglycemia, unspecified: Secondary | ICD-10-CM | POA: Diagnosis not present

## 2017-01-18 DIAGNOSIS — K219 Gastro-esophageal reflux disease without esophagitis: Secondary | ICD-10-CM | POA: Diagnosis not present

## 2017-01-18 DIAGNOSIS — E038 Other specified hypothyroidism: Secondary | ICD-10-CM | POA: Diagnosis not present

## 2017-01-18 DIAGNOSIS — E871 Hypo-osmolality and hyponatremia: Secondary | ICD-10-CM | POA: Diagnosis not present

## 2017-01-18 DIAGNOSIS — I5023 Acute on chronic systolic (congestive) heart failure: Secondary | ICD-10-CM | POA: Diagnosis not present

## 2017-01-22 DIAGNOSIS — I509 Heart failure, unspecified: Secondary | ICD-10-CM | POA: Diagnosis not present

## 2017-01-22 DIAGNOSIS — J9601 Acute respiratory failure with hypoxia: Secondary | ICD-10-CM | POA: Diagnosis not present

## 2017-01-22 DIAGNOSIS — I4901 Ventricular fibrillation: Secondary | ICD-10-CM | POA: Diagnosis not present

## 2017-01-22 DIAGNOSIS — R1084 Generalized abdominal pain: Secondary | ICD-10-CM | POA: Diagnosis not present

## 2017-01-22 DIAGNOSIS — R262 Difficulty in walking, not elsewhere classified: Secondary | ICD-10-CM | POA: Diagnosis not present

## 2017-01-22 DIAGNOSIS — Z885 Allergy status to narcotic agent status: Secondary | ICD-10-CM | POA: Diagnosis not present

## 2017-01-22 DIAGNOSIS — I10 Essential (primary) hypertension: Secondary | ICD-10-CM | POA: Diagnosis not present

## 2017-01-22 DIAGNOSIS — Z9981 Dependence on supplemental oxygen: Secondary | ICD-10-CM | POA: Diagnosis not present

## 2017-01-22 DIAGNOSIS — I440 Atrioventricular block, first degree: Secondary | ICD-10-CM | POA: Diagnosis present

## 2017-01-22 DIAGNOSIS — F419 Anxiety disorder, unspecified: Secondary | ICD-10-CM | POA: Diagnosis not present

## 2017-01-22 DIAGNOSIS — E039 Hypothyroidism, unspecified: Secondary | ICD-10-CM | POA: Diagnosis present

## 2017-01-22 DIAGNOSIS — F339 Major depressive disorder, recurrent, unspecified: Secondary | ICD-10-CM | POA: Diagnosis not present

## 2017-01-22 DIAGNOSIS — E872 Acidosis: Secondary | ICD-10-CM | POA: Diagnosis present

## 2017-01-22 DIAGNOSIS — E162 Hypoglycemia, unspecified: Secondary | ICD-10-CM | POA: Diagnosis present

## 2017-01-22 DIAGNOSIS — R079 Chest pain, unspecified: Secondary | ICD-10-CM | POA: Diagnosis not present

## 2017-01-22 DIAGNOSIS — E539 Vitamin B deficiency, unspecified: Secondary | ICD-10-CM | POA: Diagnosis not present

## 2017-01-22 DIAGNOSIS — R29818 Other symptoms and signs involving the nervous system: Secondary | ICD-10-CM | POA: Diagnosis not present

## 2017-01-22 DIAGNOSIS — I2510 Atherosclerotic heart disease of native coronary artery without angina pectoris: Secondary | ICD-10-CM | POA: Diagnosis present

## 2017-01-22 DIAGNOSIS — Z950 Presence of cardiac pacemaker: Secondary | ICD-10-CM | POA: Diagnosis not present

## 2017-01-22 DIAGNOSIS — R0600 Dyspnea, unspecified: Secondary | ICD-10-CM | POA: Diagnosis not present

## 2017-01-22 DIAGNOSIS — R0902 Hypoxemia: Secondary | ICD-10-CM | POA: Diagnosis not present

## 2017-01-22 DIAGNOSIS — J440 Chronic obstructive pulmonary disease with acute lower respiratory infection: Secondary | ICD-10-CM | POA: Diagnosis not present

## 2017-01-22 DIAGNOSIS — I469 Cardiac arrest, cause unspecified: Secondary | ICD-10-CM | POA: Diagnosis not present

## 2017-01-22 DIAGNOSIS — R740 Nonspecific elevation of levels of transaminase and lactic acid dehydrogenase [LDH]: Secondary | ICD-10-CM | POA: Diagnosis present

## 2017-01-22 DIAGNOSIS — Z95810 Presence of automatic (implantable) cardiac defibrillator: Secondary | ICD-10-CM | POA: Diagnosis not present

## 2017-01-22 DIAGNOSIS — I5023 Acute on chronic systolic (congestive) heart failure: Secondary | ICD-10-CM | POA: Diagnosis present

## 2017-01-22 DIAGNOSIS — J9621 Acute and chronic respiratory failure with hypoxia: Secondary | ICD-10-CM | POA: Diagnosis present

## 2017-01-22 DIAGNOSIS — Z8619 Personal history of other infectious and parasitic diseases: Secondary | ICD-10-CM | POA: Diagnosis not present

## 2017-01-22 DIAGNOSIS — I209 Angina pectoris, unspecified: Secondary | ICD-10-CM | POA: Diagnosis not present

## 2017-01-22 DIAGNOSIS — R072 Precordial pain: Secondary | ICD-10-CM | POA: Diagnosis not present

## 2017-01-22 DIAGNOSIS — Z7982 Long term (current) use of aspirin: Secondary | ICD-10-CM | POA: Diagnosis not present

## 2017-01-22 DIAGNOSIS — I110 Hypertensive heart disease with heart failure: Secondary | ICD-10-CM | POA: Diagnosis present

## 2017-01-22 DIAGNOSIS — Z87891 Personal history of nicotine dependence: Secondary | ICD-10-CM | POA: Diagnosis not present

## 2017-01-22 DIAGNOSIS — J209 Acute bronchitis, unspecified: Secondary | ICD-10-CM | POA: Diagnosis not present

## 2017-01-22 DIAGNOSIS — E876 Hypokalemia: Secondary | ICD-10-CM | POA: Diagnosis not present

## 2017-01-22 DIAGNOSIS — E785 Hyperlipidemia, unspecified: Secondary | ICD-10-CM | POA: Diagnosis not present

## 2017-01-22 DIAGNOSIS — R791 Abnormal coagulation profile: Secondary | ICD-10-CM | POA: Diagnosis not present

## 2017-01-22 DIAGNOSIS — D649 Anemia, unspecified: Secondary | ICD-10-CM | POA: Diagnosis not present

## 2017-01-22 DIAGNOSIS — D638 Anemia in other chronic diseases classified elsewhere: Secondary | ICD-10-CM | POA: Diagnosis present

## 2017-01-22 DIAGNOSIS — R109 Unspecified abdominal pain: Secondary | ICD-10-CM | POA: Diagnosis present

## 2017-01-22 DIAGNOSIS — I255 Ischemic cardiomyopathy: Secondary | ICD-10-CM | POA: Diagnosis present

## 2017-01-22 DIAGNOSIS — Z9119 Patient's noncompliance with other medical treatment and regimen: Secondary | ICD-10-CM | POA: Diagnosis not present

## 2017-01-22 DIAGNOSIS — R531 Weakness: Secondary | ICD-10-CM | POA: Diagnosis present

## 2017-01-22 DIAGNOSIS — M6281 Muscle weakness (generalized): Secondary | ICD-10-CM | POA: Diagnosis not present

## 2017-01-22 DIAGNOSIS — K219 Gastro-esophageal reflux disease without esophagitis: Secondary | ICD-10-CM | POA: Diagnosis present

## 2017-01-26 DIAGNOSIS — J90 Pleural effusion, not elsewhere classified: Secondary | ICD-10-CM | POA: Diagnosis not present

## 2017-01-26 DIAGNOSIS — J9601 Acute respiratory failure with hypoxia: Secondary | ICD-10-CM | POA: Diagnosis not present

## 2017-01-26 DIAGNOSIS — I469 Cardiac arrest, cause unspecified: Secondary | ICD-10-CM | POA: Diagnosis not present

## 2017-01-26 DIAGNOSIS — I4901 Ventricular fibrillation: Secondary | ICD-10-CM | POA: Diagnosis not present

## 2017-01-26 DIAGNOSIS — Z950 Presence of cardiac pacemaker: Secondary | ICD-10-CM | POA: Diagnosis not present

## 2017-01-26 DIAGNOSIS — R0989 Other specified symptoms and signs involving the circulatory and respiratory systems: Secondary | ICD-10-CM | POA: Diagnosis not present

## 2017-01-26 DIAGNOSIS — I509 Heart failure, unspecified: Secondary | ICD-10-CM | POA: Diagnosis not present

## 2017-01-26 DIAGNOSIS — I10 Essential (primary) hypertension: Secondary | ICD-10-CM | POA: Diagnosis not present

## 2017-01-26 DIAGNOSIS — E876 Hypokalemia: Secondary | ICD-10-CM | POA: Diagnosis not present

## 2017-01-26 DIAGNOSIS — E038 Other specified hypothyroidism: Secondary | ICD-10-CM | POA: Diagnosis not present

## 2017-01-26 DIAGNOSIS — E872 Acidosis: Secondary | ICD-10-CM | POA: Diagnosis not present

## 2017-01-26 DIAGNOSIS — J440 Chronic obstructive pulmonary disease with acute lower respiratory infection: Secondary | ICD-10-CM | POA: Diagnosis not present

## 2017-01-26 DIAGNOSIS — M791 Myalgia: Secondary | ICD-10-CM | POA: Diagnosis not present

## 2017-01-26 DIAGNOSIS — I209 Angina pectoris, unspecified: Secondary | ICD-10-CM | POA: Diagnosis not present

## 2017-01-26 DIAGNOSIS — K219 Gastro-esophageal reflux disease without esophagitis: Secondary | ICD-10-CM | POA: Diagnosis not present

## 2017-01-26 DIAGNOSIS — E785 Hyperlipidemia, unspecified: Secondary | ICD-10-CM | POA: Diagnosis not present

## 2017-01-26 DIAGNOSIS — R262 Difficulty in walking, not elsewhere classified: Secondary | ICD-10-CM | POA: Diagnosis not present

## 2017-01-26 DIAGNOSIS — R29818 Other symptoms and signs involving the nervous system: Secondary | ICD-10-CM | POA: Diagnosis not present

## 2017-01-26 DIAGNOSIS — R791 Abnormal coagulation profile: Secondary | ICD-10-CM | POA: Diagnosis not present

## 2017-01-26 DIAGNOSIS — I5023 Acute on chronic systolic (congestive) heart failure: Secondary | ICD-10-CM | POA: Diagnosis not present

## 2017-01-26 DIAGNOSIS — Z9981 Dependence on supplemental oxygen: Secondary | ICD-10-CM | POA: Diagnosis not present

## 2017-01-26 DIAGNOSIS — J209 Acute bronchitis, unspecified: Secondary | ICD-10-CM | POA: Diagnosis not present

## 2017-01-26 DIAGNOSIS — F339 Major depressive disorder, recurrent, unspecified: Secondary | ICD-10-CM | POA: Diagnosis not present

## 2017-01-26 DIAGNOSIS — F419 Anxiety disorder, unspecified: Secondary | ICD-10-CM | POA: Diagnosis not present

## 2017-01-26 DIAGNOSIS — M6281 Muscle weakness (generalized): Secondary | ICD-10-CM | POA: Diagnosis not present

## 2017-01-26 DIAGNOSIS — E162 Hypoglycemia, unspecified: Secondary | ICD-10-CM | POA: Diagnosis not present

## 2017-01-26 DIAGNOSIS — E039 Hypothyroidism, unspecified: Secondary | ICD-10-CM | POA: Diagnosis not present

## 2017-01-26 DIAGNOSIS — R0902 Hypoxemia: Secondary | ICD-10-CM | POA: Diagnosis not present

## 2017-01-26 DIAGNOSIS — T466X5A Adverse effect of antihyperlipidemic and antiarteriosclerotic drugs, initial encounter: Secondary | ICD-10-CM | POA: Diagnosis not present

## 2017-01-26 DIAGNOSIS — D649 Anemia, unspecified: Secondary | ICD-10-CM | POA: Diagnosis not present

## 2017-01-26 DIAGNOSIS — E539 Vitamin B deficiency, unspecified: Secondary | ICD-10-CM | POA: Diagnosis not present

## 2017-01-26 DIAGNOSIS — R05 Cough: Secondary | ICD-10-CM | POA: Diagnosis not present

## 2017-01-26 DIAGNOSIS — I2510 Atherosclerotic heart disease of native coronary artery without angina pectoris: Secondary | ICD-10-CM | POA: Diagnosis not present

## 2017-01-27 DIAGNOSIS — E038 Other specified hypothyroidism: Secondary | ICD-10-CM | POA: Diagnosis not present

## 2017-01-27 DIAGNOSIS — J440 Chronic obstructive pulmonary disease with acute lower respiratory infection: Secondary | ICD-10-CM | POA: Diagnosis not present

## 2017-01-27 DIAGNOSIS — I5023 Acute on chronic systolic (congestive) heart failure: Secondary | ICD-10-CM | POA: Diagnosis not present

## 2017-01-27 DIAGNOSIS — K219 Gastro-esophageal reflux disease without esophagitis: Secondary | ICD-10-CM | POA: Diagnosis not present

## 2017-01-28 DIAGNOSIS — M791 Myalgia: Secondary | ICD-10-CM | POA: Diagnosis not present

## 2017-01-28 DIAGNOSIS — T466X5A Adverse effect of antihyperlipidemic and antiarteriosclerotic drugs, initial encounter: Secondary | ICD-10-CM | POA: Diagnosis not present

## 2017-01-28 DIAGNOSIS — E038 Other specified hypothyroidism: Secondary | ICD-10-CM | POA: Diagnosis not present

## 2017-02-03 DIAGNOSIS — J90 Pleural effusion, not elsewhere classified: Secondary | ICD-10-CM | POA: Diagnosis not present

## 2017-02-03 DIAGNOSIS — I5023 Acute on chronic systolic (congestive) heart failure: Secondary | ICD-10-CM | POA: Diagnosis not present

## 2017-02-03 DIAGNOSIS — R0902 Hypoxemia: Secondary | ICD-10-CM | POA: Diagnosis not present

## 2017-02-03 DIAGNOSIS — J440 Chronic obstructive pulmonary disease with acute lower respiratory infection: Secondary | ICD-10-CM | POA: Diagnosis not present

## 2017-02-04 DIAGNOSIS — J440 Chronic obstructive pulmonary disease with acute lower respiratory infection: Secondary | ICD-10-CM | POA: Diagnosis not present

## 2017-02-04 DIAGNOSIS — I5023 Acute on chronic systolic (congestive) heart failure: Secondary | ICD-10-CM | POA: Diagnosis not present

## 2017-02-04 DIAGNOSIS — K219 Gastro-esophageal reflux disease without esophagitis: Secondary | ICD-10-CM | POA: Diagnosis not present

## 2017-02-04 DIAGNOSIS — E038 Other specified hypothyroidism: Secondary | ICD-10-CM | POA: Diagnosis not present

## 2017-02-08 ENCOUNTER — Inpatient Hospital Stay (HOSPITAL_COMMUNITY)
Admission: EM | Admit: 2017-02-08 | Discharge: 2017-02-23 | DRG: 853 | Disposition: A | Discharge status: Home / Self Care | Payer: Medicare Other | Source: Other Acute Inpatient Hospital | Attending: Internal Medicine | Admitting: Internal Medicine

## 2017-02-08 DIAGNOSIS — J9600 Acute respiratory failure, unspecified whether with hypoxia or hypercapnia: Secondary | ICD-10-CM | POA: Diagnosis not present

## 2017-02-08 DIAGNOSIS — I248 Other forms of acute ischemic heart disease: Secondary | ICD-10-CM | POA: Diagnosis present

## 2017-02-08 DIAGNOSIS — T82855A Stenosis of coronary artery stent, initial encounter: Secondary | ICD-10-CM | POA: Diagnosis not present

## 2017-02-08 DIAGNOSIS — I454 Nonspecific intraventricular block: Secondary | ICD-10-CM | POA: Diagnosis present

## 2017-02-08 DIAGNOSIS — R0602 Shortness of breath: Secondary | ICD-10-CM

## 2017-02-08 DIAGNOSIS — J9601 Acute respiratory failure with hypoxia: Secondary | ICD-10-CM | POA: Diagnosis present

## 2017-02-08 DIAGNOSIS — E860 Dehydration: Secondary | ICD-10-CM | POA: Diagnosis present

## 2017-02-08 DIAGNOSIS — E039 Hypothyroidism, unspecified: Secondary | ICD-10-CM | POA: Diagnosis present

## 2017-02-08 DIAGNOSIS — I5022 Chronic systolic (congestive) heart failure: Secondary | ICD-10-CM | POA: Diagnosis not present

## 2017-02-08 DIAGNOSIS — I255 Ischemic cardiomyopathy: Secondary | ICD-10-CM | POA: Diagnosis present

## 2017-02-08 DIAGNOSIS — Z9861 Coronary angioplasty status: Secondary | ICD-10-CM

## 2017-02-08 DIAGNOSIS — I272 Pulmonary hypertension, unspecified: Secondary | ICD-10-CM | POA: Diagnosis present

## 2017-02-08 DIAGNOSIS — D509 Iron deficiency anemia, unspecified: Secondary | ICD-10-CM | POA: Diagnosis present

## 2017-02-08 DIAGNOSIS — N179 Acute kidney failure, unspecified: Secondary | ICD-10-CM | POA: Diagnosis not present

## 2017-02-08 DIAGNOSIS — R6520 Severe sepsis without septic shock: Secondary | ICD-10-CM | POA: Diagnosis present

## 2017-02-08 DIAGNOSIS — Z885 Allergy status to narcotic agent status: Secondary | ICD-10-CM

## 2017-02-08 DIAGNOSIS — J189 Pneumonia, unspecified organism: Secondary | ICD-10-CM | POA: Diagnosis not present

## 2017-02-08 DIAGNOSIS — I509 Heart failure, unspecified: Secondary | ICD-10-CM | POA: Diagnosis not present

## 2017-02-08 DIAGNOSIS — A419 Sepsis, unspecified organism: Secondary | ICD-10-CM | POA: Diagnosis not present

## 2017-02-08 DIAGNOSIS — J9621 Acute and chronic respiratory failure with hypoxia: Secondary | ICD-10-CM | POA: Diagnosis present

## 2017-02-08 DIAGNOSIS — Z79899 Other long term (current) drug therapy: Secondary | ICD-10-CM | POA: Diagnosis not present

## 2017-02-08 DIAGNOSIS — J9811 Atelectasis: Secondary | ICD-10-CM | POA: Diagnosis not present

## 2017-02-08 DIAGNOSIS — G4733 Obstructive sleep apnea (adult) (pediatric): Secondary | ICD-10-CM | POA: Diagnosis present

## 2017-02-08 DIAGNOSIS — J181 Lobar pneumonia, unspecified organism: Secondary | ICD-10-CM | POA: Diagnosis not present

## 2017-02-08 DIAGNOSIS — R739 Hyperglycemia, unspecified: Secondary | ICD-10-CM | POA: Diagnosis present

## 2017-02-08 DIAGNOSIS — Z9911 Dependence on respirator [ventilator] status: Secondary | ICD-10-CM | POA: Diagnosis not present

## 2017-02-08 DIAGNOSIS — R05 Cough: Secondary | ICD-10-CM | POA: Diagnosis not present

## 2017-02-08 DIAGNOSIS — Y831 Surgical operation with implant of artificial internal device as the cause of abnormal reaction of the patient, or of later complication, without mention of misadventure at the time of the procedure: Secondary | ICD-10-CM | POA: Diagnosis present

## 2017-02-08 DIAGNOSIS — T82858A Stenosis of vascular prosthetic devices, implants and grafts, initial encounter: Secondary | ICD-10-CM | POA: Diagnosis not present

## 2017-02-08 DIAGNOSIS — I2510 Atherosclerotic heart disease of native coronary artery without angina pectoris: Secondary | ICD-10-CM

## 2017-02-08 DIAGNOSIS — J918 Pleural effusion in other conditions classified elsewhere: Secondary | ICD-10-CM | POA: Diagnosis not present

## 2017-02-08 DIAGNOSIS — J449 Chronic obstructive pulmonary disease, unspecified: Secondary | ICD-10-CM | POA: Diagnosis present

## 2017-02-08 DIAGNOSIS — I110 Hypertensive heart disease with heart failure: Secondary | ICD-10-CM | POA: Diagnosis present

## 2017-02-08 DIAGNOSIS — Z87891 Personal history of nicotine dependence: Secondary | ICD-10-CM

## 2017-02-08 DIAGNOSIS — I517 Cardiomegaly: Secondary | ICD-10-CM | POA: Diagnosis not present

## 2017-02-08 DIAGNOSIS — J90 Pleural effusion, not elsewhere classified: Secondary | ICD-10-CM | POA: Diagnosis not present

## 2017-02-08 DIAGNOSIS — Z955 Presence of coronary angioplasty implant and graft: Secondary | ICD-10-CM | POA: Diagnosis not present

## 2017-02-08 DIAGNOSIS — E876 Hypokalemia: Secondary | ICD-10-CM | POA: Diagnosis not present

## 2017-02-08 DIAGNOSIS — R918 Other nonspecific abnormal finding of lung field: Secondary | ICD-10-CM | POA: Diagnosis not present

## 2017-02-08 DIAGNOSIS — J441 Chronic obstructive pulmonary disease with (acute) exacerbation: Secondary | ICD-10-CM | POA: Diagnosis present

## 2017-02-08 DIAGNOSIS — I5020 Unspecified systolic (congestive) heart failure: Secondary | ICD-10-CM

## 2017-02-08 DIAGNOSIS — Z8674 Personal history of sudden cardiac arrest: Secondary | ICD-10-CM | POA: Diagnosis not present

## 2017-02-08 DIAGNOSIS — I447 Left bundle-branch block, unspecified: Secondary | ICD-10-CM | POA: Diagnosis not present

## 2017-02-08 DIAGNOSIS — E871 Hypo-osmolality and hyponatremia: Secondary | ICD-10-CM | POA: Diagnosis not present

## 2017-02-08 DIAGNOSIS — Z9889 Other specified postprocedural states: Secondary | ICD-10-CM

## 2017-02-08 DIAGNOSIS — I252 Old myocardial infarction: Secondary | ICD-10-CM | POA: Diagnosis not present

## 2017-02-08 DIAGNOSIS — J942 Hemothorax: Secondary | ICD-10-CM | POA: Diagnosis not present

## 2017-02-08 DIAGNOSIS — R590 Localized enlarged lymph nodes: Secondary | ICD-10-CM | POA: Diagnosis present

## 2017-02-08 DIAGNOSIS — Y95 Nosocomial condition: Secondary | ICD-10-CM | POA: Diagnosis not present

## 2017-02-08 DIAGNOSIS — Z09 Encounter for follow-up examination after completed treatment for conditions other than malignant neoplasm: Secondary | ICD-10-CM

## 2017-02-08 DIAGNOSIS — Z95810 Presence of automatic (implantable) cardiac defibrillator: Secondary | ICD-10-CM

## 2017-02-08 DIAGNOSIS — R748 Abnormal levels of other serum enzymes: Secondary | ICD-10-CM | POA: Diagnosis not present

## 2017-02-08 DIAGNOSIS — J440 Chronic obstructive pulmonary disease with acute lower respiratory infection: Secondary | ICD-10-CM | POA: Diagnosis not present

## 2017-02-08 DIAGNOSIS — E785 Hyperlipidemia, unspecified: Secondary | ICD-10-CM | POA: Diagnosis present

## 2017-02-08 DIAGNOSIS — I5023 Acute on chronic systolic (congestive) heart failure: Secondary | ICD-10-CM | POA: Diagnosis present

## 2017-02-08 DIAGNOSIS — I360 Nonrheumatic tricuspid (valve) stenosis: Secondary | ICD-10-CM | POA: Diagnosis not present

## 2017-02-08 DIAGNOSIS — R627 Adult failure to thrive: Secondary | ICD-10-CM | POA: Diagnosis present

## 2017-02-08 DIAGNOSIS — R846 Abnormal cytological findings in specimens from respiratory organs and thorax: Secondary | ICD-10-CM | POA: Diagnosis not present

## 2017-02-08 HISTORY — DX: Heart failure, unspecified: I50.9

## 2017-02-08 HISTORY — DX: Chronic obstructive pulmonary disease, unspecified: J44.9

## 2017-02-08 HISTORY — DX: Essential (primary) hypertension: I10

## 2017-02-08 HISTORY — DX: Presence of automatic (implantable) cardiac defibrillator: Z95.810

## 2017-02-08 HISTORY — DX: Hypothyroidism, unspecified: E03.9

## 2017-02-08 LAB — GLUCOSE, CAPILLARY: Glucose-Capillary: 182 mg/dL — ABNORMAL HIGH (ref 65–99)

## 2017-02-09 ENCOUNTER — Inpatient Hospital Stay (HOSPITAL_COMMUNITY): Payer: Medicare Other

## 2017-02-09 ENCOUNTER — Encounter (HOSPITAL_COMMUNITY): Payer: Self-pay

## 2017-02-09 DIAGNOSIS — J9601 Acute respiratory failure with hypoxia: Secondary | ICD-10-CM | POA: Diagnosis present

## 2017-02-09 DIAGNOSIS — R0602 Shortness of breath: Secondary | ICD-10-CM

## 2017-02-09 DIAGNOSIS — Z9911 Dependence on respirator [ventilator] status: Secondary | ICD-10-CM

## 2017-02-09 LAB — CBC
HEMATOCRIT: 30.2 % — AB (ref 39.0–52.0)
Hemoglobin: 9.4 g/dL — ABNORMAL LOW (ref 13.0–17.0)
MCH: 27.4 pg (ref 26.0–34.0)
MCHC: 31.1 g/dL (ref 30.0–36.0)
MCV: 88 fL (ref 78.0–100.0)
Platelets: 234 10*3/uL (ref 150–400)
RBC: 3.43 MIL/uL — ABNORMAL LOW (ref 4.22–5.81)
RDW: 17.8 % — AB (ref 11.5–15.5)
WBC: 10.1 10*3/uL (ref 4.0–10.5)

## 2017-02-09 LAB — GLUCOSE, CAPILLARY
GLUCOSE-CAPILLARY: 126 mg/dL — AB (ref 65–99)
GLUCOSE-CAPILLARY: 171 mg/dL — AB (ref 65–99)
GLUCOSE-CAPILLARY: 212 mg/dL — AB (ref 65–99)
Glucose-Capillary: 112 mg/dL — ABNORMAL HIGH (ref 65–99)
Glucose-Capillary: 184 mg/dL — ABNORMAL HIGH (ref 65–99)
Glucose-Capillary: 198 mg/dL — ABNORMAL HIGH (ref 65–99)

## 2017-02-09 LAB — BASIC METABOLIC PANEL
Anion gap: 13 (ref 5–15)
BUN: 26 mg/dL — AB (ref 6–20)
CALCIUM: 8.9 mg/dL (ref 8.9–10.3)
CO2: 22 mmol/L (ref 22–32)
CREATININE: 1.23 mg/dL (ref 0.61–1.24)
Chloride: 100 mmol/L — ABNORMAL LOW (ref 101–111)
GFR calc non Af Amer: >60 mL/min (ref >60)
Glucose, Bld: 197 mg/dL — ABNORMAL HIGH (ref 65–99)
Potassium: 3.6 mmol/L (ref 3.5–5.1)
SODIUM: 135 mmol/L (ref 135–145)

## 2017-02-09 LAB — BLOOD GAS, ARTERIAL
ACID-BASE DEFICIT: 4.4 mmol/L — AB (ref 0.0–2.0)
BICARBONATE: 19 mmol/L — AB (ref 20.0–28.0)
DELIVERY SYSTEMS: POSITIVE
Drawn by: 398661
EXPIRATORY PAP: 5
FIO2: 30
INSPIRATORY PAP: 10
O2 Saturation: 96.3 %
PATIENT TEMPERATURE: 98.6
PH ART: 7.438 (ref 7.350–7.450)
PO2 ART: 89.3 mmHg (ref 83.0–108.0)
RATE: 10 resp/min
pCO2 arterial: 28.6 mmHg — ABNORMAL LOW (ref 32.0–48.0)

## 2017-02-09 LAB — BODY FLUID CELL COUNT WITH DIFFERENTIAL
EOS FL: 1 %
LYMPHS FL: 14 %
MONOCYTE-MACROPHAGE-SEROUS FLUID: 78 % (ref 50–90)
NEUTROPHIL FLUID: 7 % (ref 0–25)
WBC FLUID: 164 uL (ref 0–1000)

## 2017-02-09 LAB — GLUCOSE, PLEURAL OR PERITONEAL FLUID: Glucose, Fluid: 112 mg/dL

## 2017-02-09 LAB — LACTIC ACID, PLASMA
LACTIC ACID, VENOUS: 5.4 mmol/L — AB (ref 0.5–1.9)
LACTIC ACID, VENOUS: 4.3 mmol/L — AB (ref 0.5–1.9)

## 2017-02-09 LAB — PROTIME-INR
INR: 1.51
Prothrombin Time: 18.4 seconds — ABNORMAL HIGH (ref 11.4–15.2)

## 2017-02-09 LAB — AMYLASE, PLEURAL OR PERITONEAL FLUID: Amylase, Fluid: 39 U/L

## 2017-02-09 LAB — ALBUMIN: Albumin: 2.8 g/dL — ABNORMAL LOW (ref 3.5–5.0)

## 2017-02-09 LAB — ALBUMIN, PLEURAL OR PERITONEAL FLUID: ALBUMIN FL: 1.6 g/dL

## 2017-02-09 LAB — MAGNESIUM: MAGNESIUM: 1.6 mg/dL — AB (ref 1.7–2.4)

## 2017-02-09 LAB — PROTEIN, TOTAL: Total Protein: 6.7 g/dL (ref 6.5–8.1)

## 2017-02-09 LAB — LACTATE DEHYDROGENASE, PLEURAL OR PERITONEAL FLUID: LD, Fluid: 159 U/L — ABNORMAL HIGH (ref 3–23)

## 2017-02-09 LAB — PHOSPHORUS: PHOSPHORUS: 4.4 mg/dL (ref 2.5–4.6)

## 2017-02-09 LAB — APTT: APTT: 28 s (ref 24–36)

## 2017-02-09 LAB — MRSA PCR SCREENING: MRSA by PCR: POSITIVE — AB

## 2017-02-09 LAB — HEMATOCRIT: HCT: 28.9 % — ABNORMAL LOW (ref 39.0–52.0)

## 2017-02-09 LAB — LACTATE DEHYDROGENASE: LDH: 335 U/L — ABNORMAL HIGH (ref 98–192)

## 2017-02-09 LAB — PROTEIN, PLEURAL OR PERITONEAL FLUID: Total protein, fluid: 3.1 g/dL

## 2017-02-09 MED ORDER — LEVOFLOXACIN IN D5W 750 MG/150ML IV SOLN: 750.0000 mg | INTRAVENOUS | Status: DC

## 2017-02-09 MED ORDER — IPRATROPIUM-ALBUTEROL 0.5-2.5 (3) MG/3ML IN SOLN
3.0000 mL | Freq: Four times a day (QID) | RESPIRATORY_TRACT | Status: DC
  Administered 2017-02-09 – 2017-02-10 (×6): 3 mL via RESPIRATORY_TRACT
  Filled 2017-02-09 (×6): qty 3

## 2017-02-09 MED ORDER — ALPRAZOLAM 0.5 MG PO TABS
0.5000 mg | ORAL_TABLET | Freq: Every evening | ORAL | Status: DC | PRN
  Administered 2017-02-09 – 2017-02-22 (×13): 0.5 mg via ORAL
  Filled 2017-02-09 (×13): qty 1

## 2017-02-09 MED ORDER — CHLORHEXIDINE GLUCONATE 0.12 % MT SOLN
15.0000 mL | Freq: Two times a day (BID) | OROMUCOSAL | Status: DC
  Administered 2017-02-09 (×2): 15 mL via OROMUCOSAL

## 2017-02-09 MED ORDER — ALPRAZOLAM 0.25 MG PO TABS
0.2500 mg | ORAL_TABLET | Freq: Once | ORAL | Status: AC
  Administered 2017-02-09: 0.25 mg via ORAL
  Filled 2017-02-09: qty 1

## 2017-02-09 MED ORDER — BUDESONIDE 0.5 MG/2ML IN SUSP
0.5000 mg | Freq: Two times a day (BID) | RESPIRATORY_TRACT | Status: DC
  Administered 2017-02-09 – 2017-02-21 (×25): 0.5 mg via RESPIRATORY_TRACT
  Filled 2017-02-09 (×30): qty 2

## 2017-02-09 MED ORDER — PANTOPRAZOLE SODIUM 40 MG IV SOLR
40.0000 mg | Freq: Every day | INTRAVENOUS | Status: DC
  Administered 2017-02-09 – 2017-02-15 (×8): 40 mg via INTRAVENOUS
  Filled 2017-02-09 (×8): qty 40

## 2017-02-09 MED ORDER — ARFORMOTEROL TARTRATE 15 MCG/2ML IN NEBU
15.0000 ug | INHALATION_SOLUTION | Freq: Two times a day (BID) | RESPIRATORY_TRACT | Status: DC
  Administered 2017-02-09 – 2017-02-21 (×25): 15 ug via RESPIRATORY_TRACT
  Filled 2017-02-09 (×30): qty 2

## 2017-02-09 MED ORDER — SODIUM CHLORIDE 0.9 % IV SOLN
1500.0000 mg | Freq: Once | INTRAVENOUS | Status: AC
  Administered 2017-02-09: 1500 mg via INTRAVENOUS
  Filled 2017-02-09: qty 1500

## 2017-02-09 MED ORDER — SODIUM CHLORIDE 0.9 % IV SOLN: 250.0000 mL | INTRAVENOUS | Status: DC | PRN

## 2017-02-09 MED ORDER — SODIUM CHLORIDE 0.9 % IV SOLN
1.0000 g | Freq: Three times a day (TID) | INTRAVENOUS | Status: DC
  Administered 2017-02-09 – 2017-02-12 (×9): 1 g via INTRAVENOUS
  Filled 2017-02-09 (×12): qty 1

## 2017-02-09 MED ORDER — MUPIROCIN 2 % EX OINT
1.0000 "application " | TOPICAL_OINTMENT | Freq: Two times a day (BID) | CUTANEOUS | Status: AC
  Administered 2017-02-09 – 2017-02-13 (×10): 1 via NASAL
  Filled 2017-02-09 (×2): qty 22

## 2017-02-09 MED ORDER — HEPARIN SODIUM (PORCINE) 5000 UNIT/ML IJ SOLN: 5000.0000 [IU] | Freq: Three times a day (TID) | INTRAMUSCULAR | Status: DC

## 2017-02-09 MED ORDER — SODIUM CHLORIDE 0.9 % IV SOLN
INTRAVENOUS | Status: DC
  Administered 2017-02-09: 02:00:00 via INTRAVENOUS

## 2017-02-09 MED ORDER — ORAL CARE MOUTH RINSE
15.0000 mL | Freq: Two times a day (BID) | OROMUCOSAL | Status: DC
  Administered 2017-02-09: 15 mL via OROMUCOSAL

## 2017-02-09 MED ORDER — INSULIN ASPART 100 UNIT/ML ~~LOC~~ SOLN
2.0000 [IU] | SUBCUTANEOUS | Status: DC
  Administered 2017-02-09 (×3): 4 [IU] via SUBCUTANEOUS
  Administered 2017-02-09: 2 [IU] via SUBCUTANEOUS
  Administered 2017-02-09: 6 [IU] via SUBCUTANEOUS
  Administered 2017-02-09: 4 [IU] via SUBCUTANEOUS
  Administered 2017-02-10: 2 [IU] via SUBCUTANEOUS

## 2017-02-09 MED ORDER — VANCOMYCIN HCL IN DEXTROSE 1-5 GM/200ML-% IV SOLN
1000.0000 mg | Freq: Two times a day (BID) | INTRAVENOUS | Status: DC
  Administered 2017-02-09 – 2017-02-10 (×2): 1000 mg via INTRAVENOUS
  Filled 2017-02-09 (×2): qty 200

## 2017-02-09 MED ORDER — CHLORHEXIDINE GLUCONATE CLOTH 2 % EX PADS
6.0000 | MEDICATED_PAD | Freq: Every day | CUTANEOUS | Status: AC
  Administered 2017-02-09 – 2017-02-13 (×5): 6 via TOPICAL

## 2017-02-09 MED ORDER — MAGNESIUM SULFATE 2 GM/50ML IV SOLN
2.0000 g | Freq: Once | INTRAVENOUS | Status: AC
Start: 2017-02-09 — End: 2017-02-09
  Administered 2017-02-09: 2 g via INTRAVENOUS
  Filled 2017-02-09: qty 50

## 2017-02-09 MED ORDER — LACTATED RINGERS IV SOLN
INTRAVENOUS | Status: DC
  Administered 2017-02-09: 12:00:00 via INTRAVENOUS

## 2017-02-09 NOTE — H&P (Signed)
PULMONARY / CRITICAL CARE MEDICINE   Name: Stephen Cardenas MRN: [id number] DOB: [birth year]    ADMISSION DATE:  02/08/2017  CHIEF COMPLAINT:  SOB, Pleural effusion  HISTORY OF PRESENT ILLNESS:   36yoM with history of CHF (EF 15%), CAD s/p MI, AICD, COPD, HTN, Hypothyroidism, Prior tobacco abuse, recent HCAP, and multiple recent hospitalizations. Reportedly 3 weeks ago he was discharged from [facility], following which he was admitted to [facility] center, then discharged to Rehab from there. He was discharged home from rehab on 6/25 due to increasing weakness and failure to participate in PT. Tonight, patient developed SOB with increased work of breathing. EMS was called and patient was taken to [facility] and placed on BIPAP. CXR at that time showed a large left sided pleural effusion. Patient received Zosyn and Levaquin at that hospital. Chest CT was unable to be obtained as patient could not lay flat due to his dyspnea.    PAST MEDICAL HISTORY :  He  has no past medical history on file.  PAST SURGICAL HISTORY: He  has no past surgical history on file.  Allergies not on file  No current facility-administered medications on file prior to encounter.    No current outpatient prescriptions on file prior to encounter.   FAMILY HISTORY:  His has no family status information on file.   SOCIAL HISTORY: He  lives at home with his family but spent the past 3 weeks in hospitals and rehab. Prior history of tobacco abuse but none now. Denies illicit drug use.  REVIEW OF SYSTEMS:   Review of Systems  Constitutional: Positive for malaise/fatigue. Negative for chills, fever and weight loss.  HENT: Negative.   Eyes: Negative.   Respiratory: Positive for shortness of breath. Negative for cough, sputum production and wheezing.   Cardiovascular: Negative.  Negative for chest pain and leg swelling.  Gastrointestinal: Negative.   Genitourinary: Negative.   Musculoskeletal:  Negative.   Skin: Negative.   Neurological: Negative.   Endo/Heme/Allergies: Negative.   Psychiatric/Behavioral: Negative.   All other systems reviewed and are negative.  VITAL SIGNS: BP (!) 130/95 (BP Location: Left Arm)   Pulse 97   Temp 97.5 F (36.4 C) (Axillary)   Resp (!) 25   Ht 5\' 10"  (1.778 m)   Wt 75.3 kg (166 lb 0.1 oz)   SpO2 99%   BMI 23.82 kg/m    VENTILATOR SETTINGS: Vent Mode: BIPAP FiO2 (%):  [30 %] 30 % Set Rate:  [10 bmp] 10 bmp PEEP:  [5 cmH20] 5 cmH20  INTAKE / OUTPUT: No intake/output data recorded.  PHYSICAL EXAMINATION: General: Elderly WM appears older than stated age, has full beard, on BIPAP full face mask, in NAD Neuro: AAOx3, moving all extremities HEENT: OP clear, Mucous membranes dry Cardiovascular: RRR, no m/r/g Lungs: CTA on Right, Absent breath sounds on Left; no respiratory distress or accessory muscle use ON the BIPAP. Reportedly had increased work of breathing prior to starting BIPAP.  Abdomen: Soft flat, NTND, BS+ Musculoskeletal: no clubbing, cyanosis, or edema Skin: no rash  LABS:  BMET No results for input(s): NA, K, CL, CO2, BUN, CREATININE, GLUCOSE in the last 168 hours.  Electrolytes No results for input(s): CALCIUM, MG, PHOS in the last 168 hours.  CBC No results for input(s): WBC, HGB, HCT, PLT in the last 168 hours.  Coag's No results for input(s): APTT, INR in the last 168 hours.  Sepsis Markers No results for input(s): LATICACIDVEN, PROCALCITON, O2SATVEN in the last 168  hours.  ABG No results for input(s): PHART, PCO2ART, PO2ART in the last 168 hours.  Liver Enzymes No results for input(s): AST, ALT, ALKPHOS, BILITOT, ALBUMIN in the last 168 hours.  Cardiac Enzymes No results for input(s): TROPONINI, PROBNP in the last 168 hours.  Glucose  Recent Labs Lab 02/08/17 2340  GLUCAP 182*    Imaging No results found.   CULTURES: Blood cultures (02/08/17, [facility]):  pending  ANTIBIOTICS: S/p 1 dose Zosyn and 1 dose Levaquin at [facility] on 02/08/17  LINES/TUBES: 2-20G PIV's   ASSESSMENT / PLAN: 60yoM with history of CHF (EF 15%), CAD s/p MI, AICD, COPD, HTN, Hypothyroidism, Prior tobacco abuse, recent HCAP, and multiple recent hospitalizations. Now admitted with acute hypoxic respiratory failure with SOB and increased work of breathing, requiring BIPAP. CXR shows a large left sided pleural effusion.   PULMONARY 1. Acute hypoxic respiratory failure; Large left-sided pleural effusion:  - CXR on my review shows large left-sided pleural effusion - Clinically patient appears dry. I appreciate the elevated BNP, but BNP's are not very accurate in setting of AKI which this patient has. Also would expect pleural effusion related to CHF exacerbation to be bilateral, which is not the case here. Greatest suspicion would be for malignant pleural effusion vs parapneumonic effusion. - Given his multiple recent admissions, would want to treat for possible MDR bacteria. Will start Vanc, continue Levaquin, and change the Zosyn to Meropenem.  - Blood cultures from [facility] are pending; will also order sputum culture if one can be obtained. - Plan on thoracentesis tonight; will send labs to microbiology and pathology - Repeat ABG and Lactate now; attempt to wean BIPAP as able after the thoracentesis.  2. COPD:  - reportedly has COPD per [facility] records; patient denies this but states he is on nebulizer treatments at home - continue Pulmicort and Brovana nebs BID and Duonebs q6 hours.   CARDIOVASCULAR 1. CHF, CAD, HTN: - clinically appears dry at this time, so will hold off on diuretics at this time.  - is currently normotensive; hold antihypertensives for now and re-eval in the AM - is on Brilinta and ASA at home; will hold for now pending procedure.  RENAL 1. Mild AKI: - gentle IVF's (very gentle given advanced CHF) and monitor UOP  closely  GASTROINTESTINAL No acute issues   HEMATOLOGIC 1. Anemia: from unclear baseline. No obvious blood loss. Continue to monitor.   INFECTIOUS 1. Severe sepsis: presumed due to parapneumonic effusion vs empyema - trend lactate - will give gentle IVF's but not the full recommended 30cc/kg bolus given patient's advanced CHF - start Vanc, Meropenem, and Levaquin as stated above - follow-up cultures   ENDOCRINE No active issues   NEUROLOGIC No active issues      60 minutes non-procedural critical care time   Vernie Murders, MD Pulmonary and [place] Pager: [phone]  02/09/2017, 12:37 AM

## 2017-02-09 NOTE — Procedures (Signed)
Thoracentesis Procedure Note  Pre-operative Diagnosis: Left-sided Pleural effusion  Post-operative Diagnosis: Left-sided Hemothorax  Indications: diagnostic and therapeutic   Procedure Details  Consent: Informed consent was obtained. Risks of the procedure were discussed including: infection, bleeding, pain, pneumothorax.  Under sterile conditions the patient was positioned. Chloroprep and sterile drapes were utilized. 10cc of 1% Lidocaine was used to anesthetize the local tissue. Fluid was obtained without any difficulties and minimal blood loss.  A dressing was applied to the wound and wound care instructions were provided.   Findings 2200cc ml of Frank dark maroon blood / pleural fluid was obtained. A sample was sent to Pathology for cytogenetics, flow, and cell counts, as well as for infection analysis.  Complications:  None; patient tolerated the procedure well   Condition: Stable  Plan A follow up chest x-ray was ordered. Bed Rest for 3 hours. Tylenol 650 mg. for pain.  Attending Attestation:  Vernie Murders, MD Pulmonary & Critical Care Medicine

## 2017-02-09 NOTE — Progress Notes (Signed)
Thoracentesis revealed that patient's effusion was actually a hemothorax. Will hold his ASA and Brilinta. Will stop the subcutaneous heparin that was ordered (did not receive a dose here yet). Will likely require a surgical chest tube but since patient now improving and weaned off BIPAP and onto 3L O2, will wait and let morning team re-eval. Not clear why he would have a spontaneous hemothorax. He denies any falls as rehab or recent trauma. Cannot rule out that this could be related to a malignancy, but it is more frankly bloody than that typically seen with a malignant pleural effusion.

## 2017-02-09 NOTE — Progress Notes (Signed)
CRITICAL VALUE ALERT  Critical Value:  Lactic 4.3  Date & Time Notied:  02/09/17; 1036  Provider Notified: Dr. Titus Mould  Orders Received/Actions taken: MD notified

## 2017-02-09 NOTE — Progress Notes (Signed)
PULMONARY / CRITICAL CARE MEDICINE   Name: Stephen Cardenas MRN:   [id number] DOB:   03/02/1956           ADMISSION DATE:  02/08/2017  CHIEF COMPLAINT:  SOB, Pleural effusion  HISTORY OF PRESENT ILLNESS:   61yoM with history of CHF (EF 15%), CAD s/p MI, AICD, COPD, HTN, Hypothyroidism, Prior tobacco abuse, recent HCAP, and multiple recent hospitalizations. Reportedly 3 weeks ago he was discharged from [facility], following which he was admitted to [facility] center, then discharged to Rehab from there. He was discharged home from rehab on 6/25 due to increasing weakness and failure to participate in PT. Tonight, patient developed SOB with increased work of breathing. EMS was called and patient was taken to [facility] and placed on BIPAP. CXR at that time showed a large left sided pleural effusion. Patient received Zosyn and Levaquin at that hospital. Chest CT was unable to be obtained as patient could not lay flat due to his dyspnea.    Subjective  Feels a little better.   Objective   BP 102/69 (BP Location: Left Arm)   Pulse 88   Temp 97.2 F (36.2 C) (Oral)   Resp (!) 23   Ht 5\' 10"  (1.778 m)   Wt 164 lb 7.4 oz (74.6 kg)   SpO2 99%   BMI 23.60 kg/m   Intake/Output Summary (Last 24 hours) at 02/09/17 1025 Last data filed at 02/09/17 1000  Gross per 24 hour  Intake             1355 ml  Output             2700 ml  Net            -1345 ml    Physical Exam  Constitutional: He is oriented to person, place, and time. No distress.  HENT:  Head: Normocephalic and atraumatic.  Mouth/Throat: Oropharynx is clear and moist. No oropharyngeal exudate.  Eyes: Conjunctivae and EOM are normal. Pupils are equal, round, and reactive to light.  Neck: Normal range of motion. Neck supple. No JVD present.  Cardiovascular: Normal rate and regular rhythm.   No murmur heard. Pulmonary/Chest: No accessory muscle usage. No respiratory distress. He has decreased breath  sounds in the left lower field.  Abdominal: Soft. Normal appearance and bowel sounds are normal. There is no tenderness.  Neurological: He is alert and oriented to person, place, and time.  Skin: Skin is warm, dry and intact. Capillary refill takes less than 2 seconds. He is not diaphoretic.  Psychiatric: He has a normal mood and affect. His speech is normal and behavior is normal.   CBC Recent Labs     02/09/17  0245  WBC  10.1  HGB  9.4*  HCT  30.2*  PLT  234    Coag's No results for input(s): APTT, INR in the last 72 hours.  BMET Recent Labs     02/09/17  0245  NA  135  K  3.6  CL  100*  CO2  22  BUN  26*  CREATININE  1.23  GLUCOSE  197*    Electrolytes Recent Labs     02/09/17  0245  CALCIUM  8.9  MG  1.6*  PHOS  4.4    Sepsis Markers No results for input(s): PROCALCITON, O2SATVEN in the last 72 hours.  Invalid input(s): LACTICACIDVEN  ABG Recent Labs     02/09/17  0110  PHART  7.438  PCO2ART  28.6*  PO2ART  89.3    Liver Enzymes Recent Labs     02/09/17  0245  ALBUMIN  2.8*    Cardiac Enzymes No results for input(s): TROPONINI, PROBNP in the last 72 hours.  Glucose Recent Labs     02/08/17  2340  02/09/17  0348  02/09/17  0824  GLUCAP  182*  184*  126*    Imaging Dg Chest Port 1 View  Result Date: 02/09/2017 CLINICAL DATA:  Thoracentesis EXAM: PORTABLE CHEST 1 VIEW COMPARISON:  02/09/2017 FINDINGS: Substantial reduction of left pleural fluid volume. No pneumothorax. Mild residual left base opacity likely represents a combination of consolidation and residual pleural fluid. The right lung is clear. Moderate cardiomegaly persists. Grossly intact appearances of the transvenous cardiac leads. IMPRESSION: Substantial reduction of pleural fluid volume after left thoracentesis. No pneumothorax. Electronically Signed   By: Andreas Newport M.D.   On: 02/09/2017 03:11   Dg Chest Port 1 View  Result Date: 02/09/2017 CLINICAL DATA:  61 y/o   M; shortness of breath. EXAM: PORTABLE CHEST 1 VIEW COMPARISON:  None. FINDINGS: Near complete opacification of the left lung probably representing a large effusion lung consolidation. Grossly clear right lung. The cardiac silhouette is largely obscured by opacification of the left hemithorax. Partially visualized 2 lead pacemaker. The patient's chin obscures the lung apices. No acute osseous abnormality is evident. IMPRESSION: Complete opacification of the left lung probably representing large effusion and lung consolidation. Electronically Signed   By: Kristine Garbe M.D.   On: 02/09/2017 01:22    CULTURES: Blood cultures (02/08/17, [facility]): pending  ANTIBIOTICS: S/p 1 dose Zosyn and 1 dose Levaquin at [facility] on 02/08/17  LINES/TUBES: 2-20G PIV's  Impression/plan  Acute hypoxic respiratory failure in setting of large left sided bloody appearing exudative pleural effusion.  COPD -s/p 2200 ml dark maroon colored bloody drainage. Pleural fluid analysis 6/27 Cell count: bloody, neuts: 7; glucose: 112; LDH 159; protein 3.1 Ddx: malignant effusion vs spont hemothorax vs parapneumonic pcxr personally reviewed s/p thora. Marked improvement, no ptx, minimal basilar effusion/atx Plan Day 1 vanc & meropenem for Nosocomial coverage  Non-contrast CT chest.  Cont pulmicort, brovana & duonebs Holding anticoagulation  May need thoracic eval   H/o CHF, CAD, HTN  Sepsis (severe) Plan Holding brilinta and asa d/t concern for hemothorax Holding antihypertensives and diuresis  Cont tele  Cont gentle IVFs abx per above  Mild AKI Hypomagnesemia  Plan Cont IVFs Replace Mg F/u lab work   Anemia  Plan Repeat cbc SCDs  Hyperglycemia Plan ssi   Can go to [place] ACNP-BC [place] Pager # [phone] OR # [phone] if no answer   STAFF NOTE: I, Merrie Roof, MD FACP have personally reviewed patient's available data,  including medical history, events of note, physical examination and test results as part of my evaluation. I have discussed with resident/NP and other care providers such as pharmacist, RN and RRT. In addition, I personally evaluated patient and elicited key findings of: awake, calm in bed, no distress, left side improved aeration, scattered ronchi left, edema mild, pcxr which I reviewed revealed large effusion left and aicd and now resolved completely on pcxr, the pt underwent thora descrobed as hemothorax, I dont see a HCt in the flui but somewhat unimpressive ldh etc, with h;o chf on anticoagulation, all anticoagulation/plat held, no need NIMv further, cbc now to assess drop and in am, will assess ct chest today withou contrast, can advance  diet Would obtain pt, ptt, inr now, , I have updated th ept in full and managed heme, resp, and complicated system failures,. I udpated the pt in full Lavon Paganini. Titus Mould, MD, [place] Pgr: [place] Pulmonary & Critical Care 02/09/2017 11:07 AM

## 2017-02-09 NOTE — Progress Notes (Signed)
Pharmacy Antibiotic Note  Stephen Cardenas is a 61 y.o. male admitted on 02/08/2017 with SOB and respiratory failure/sepsis .  Pharmacy has been consulted for Vancomycin Meropenem and Levaquin dosing.  Plan: Vancomycin 1500 mg IV now, then 1 g IV q12h Meropenem 1 g IV q8h Levaquin 750 mg IV q24h   Height: 5\' 10"  (177.8 cm) Weight: 166 lb 0.1 oz (75.3 kg) IBW/kg (Calculated) : 73  Temp (24hrs), Avg:97.5 F (36.4 C), Min:97.5 F (36.4 C), Max:97.5 F (36.4 C)  Labs (at outside hospital) WBC  9.5 Hgb  10.8 Hct  36 Plt  388  SCr  1.09  No results for input(s): WBC, CREATININE, LATICACIDVEN, VANCOTROUGH, VANCOPEAK, VANCORANDOM, GENTTROUGH, GENTPEAK, GENTRANDOM, TOBRATROUGH, TOBRAPEAK, TOBRARND, AMIKACINPEAK, AMIKACINTROU, AMIKACIN in the last 168 hours.  CrCl cannot be calculated (No order found.).    Allergies not on file    Caryl Pina 02/09/2017 1:34 AM

## 2017-02-10 ENCOUNTER — Inpatient Hospital Stay (HOSPITAL_COMMUNITY): Payer: Medicare Other

## 2017-02-10 DIAGNOSIS — A419 Sepsis, unspecified organism: Principal | ICD-10-CM

## 2017-02-10 DIAGNOSIS — I5022 Chronic systolic (congestive) heart failure: Secondary | ICD-10-CM

## 2017-02-10 DIAGNOSIS — J441 Chronic obstructive pulmonary disease with (acute) exacerbation: Secondary | ICD-10-CM

## 2017-02-10 DIAGNOSIS — N179 Acute kidney failure, unspecified: Secondary | ICD-10-CM

## 2017-02-10 LAB — GLUCOSE, CAPILLARY
GLUCOSE-CAPILLARY: 95 mg/dL (ref 65–99)
GLUCOSE-CAPILLARY: 107 mg/dL — AB (ref 65–99)
GLUCOSE-CAPILLARY: 103 mg/dL — AB (ref 65–99)
Glucose-Capillary: 134 mg/dL — ABNORMAL HIGH (ref 65–99)
Glucose-Capillary: 131 mg/dL — ABNORMAL HIGH (ref 65–99)

## 2017-02-10 LAB — CBC
HCT: 28.4 % — ABNORMAL LOW (ref 39.0–52.0)
HEMOGLOBIN: 8.7 g/dL — AB (ref 13.0–17.0)
MCH: 26.4 pg (ref 26.0–34.0)
MCHC: 30.6 g/dL (ref 30.0–36.0)
MCV: 86.3 fL (ref 78.0–100.0)
Platelets: 228 10*3/uL (ref 150–400)
RBC: 3.29 MIL/uL — ABNORMAL LOW (ref 4.22–5.81)
RDW: 17.6 % — AB (ref 11.5–15.5)
WBC: 15.4 10*3/uL — ABNORMAL HIGH (ref 4.0–10.5)

## 2017-02-10 LAB — TRIGLYCERIDES, BODY FLUIDS: Triglycerides, Fluid: 51 mg/dL

## 2017-02-10 LAB — BASIC METABOLIC PANEL
Anion gap: 7 (ref 5–15)
BUN: 21 mg/dL — AB (ref 6–20)
CALCIUM: 8.4 mg/dL — AB (ref 8.9–10.3)
CHLORIDE: 102 mmol/L (ref 101–111)
CO2: 26 mmol/L (ref 22–32)
CREATININE: 0.86 mg/dL (ref 0.61–1.24)
GFR calc Af Amer: >60 mL/min (ref >60)
GFR calc non Af Amer: >60 mL/min (ref >60)
Glucose, Bld: 101 mg/dL — ABNORMAL HIGH (ref 65–99)
Potassium: 4 mmol/L (ref 3.5–5.1)
SODIUM: 135 mmol/L (ref 135–145)

## 2017-02-10 LAB — STREP PNEUMONIAE URINARY ANTIGEN: STREP PNEUMO URINARY ANTIGEN: NEGATIVE

## 2017-02-10 LAB — LACTIC ACID, PLASMA: Lactic Acid, Venous: 1.5 mmol/L (ref 0.5–1.9)

## 2017-02-10 LAB — PH, BODY FLUID: PH, BODY FLUID: 7.7

## 2017-02-10 MED ORDER — INSULIN ASPART 100 UNIT/ML ~~LOC~~ SOLN: 0.0000 [IU] | Freq: Every day | SUBCUTANEOUS | Status: DC

## 2017-02-10 MED ORDER — TICAGRELOR 90 MG PO TABS
90.0000 mg | ORAL_TABLET | Freq: Two times a day (BID) | ORAL | Status: DC
  Administered 2017-02-10 – 2017-02-17 (×15): 90 mg via ORAL
  Filled 2017-02-10 (×15): qty 1

## 2017-02-10 MED ORDER — VANCOMYCIN HCL IN DEXTROSE 750-5 MG/150ML-% IV SOLN
750.0000 mg | Freq: Three times a day (TID) | INTRAVENOUS | Status: DC
  Administered 2017-02-10 – 2017-02-12 (×6): 750 mg via INTRAVENOUS
  Filled 2017-02-10 (×7): qty 150

## 2017-02-10 MED ORDER — IPRATROPIUM-ALBUTEROL 0.5-2.5 (3) MG/3ML IN SOLN
3.0000 mL | Freq: Two times a day (BID) | RESPIRATORY_TRACT | Status: DC
  Administered 2017-02-10 – 2017-02-20 (×19): 3 mL via RESPIRATORY_TRACT
  Filled 2017-02-10 (×21): qty 3

## 2017-02-10 MED ORDER — ASPIRIN EC 81 MG PO TBEC
81.0000 mg | DELAYED_RELEASE_TABLET | Freq: Every day | ORAL | Status: DC
  Administered 2017-02-10 – 2017-02-13 (×4): 81 mg via ORAL
  Filled 2017-02-10 (×4): qty 1

## 2017-02-10 MED ORDER — SODIUM CHLORIDE 0.9 % IV SOLN
INTRAVENOUS | Status: DC
  Administered 2017-02-10: 11:00:00 via INTRAVENOUS

## 2017-02-10 MED ORDER — INSULIN ASPART 100 UNIT/ML ~~LOC~~ SOLN
0.0000 [IU] | Freq: Three times a day (TID) | SUBCUTANEOUS | Status: DC
  Administered 2017-02-10 – 2017-02-14 (×4): 1 [IU] via SUBCUTANEOUS

## 2017-02-10 NOTE — Progress Notes (Signed)
PROGRESS NOTE    Hays Dunnigan  JOA:[id number] DOB: [birth year] DOA: 02/08/2017 PCP: System, Pcp Not In   Brief Narrative:  61yoM PMHx Chronic Systolic CHF (EF 60%), CAD s/p MI, AICD, COPD, OSA on CPAP, HTN, Hypothyroidism, Prior tobacco abuse, recent HCAP, and multiple recent hospitalizations. Reportedly 3 weeks ago he was discharged from [facility], following which he was admitted to [facility] center, then discharged to Rehab from there. He was discharged home from rehab on 6/25 due to increasing weakness and failure to participate in PT.   Tonight, patient developed SOB with increased work of breathing. EMS was called and patient was taken to [facility] and placed on BIPAP. CXR at that time showed a large left sided pleural effusion. Patient received Zosyn and Levaquin at that hospital. Chest CT was unable to be obtained as patient could not lay flat due to his dyspnea.    Subjective: 6/28  A/O 4, negative CP, positive SOB but significantly improved, negative abdominal pain, negative N/V. States not on home O2. On CPAP at night. States base weight~185 pounds (84 kg). Sees a cardiologist in [place]:   Active Problems:   Acute respiratory failure with hypoxia (HCC)   Ventilator dependent (HCC)   Shortness of breath   SOB (shortness of breath)   Severe sepsis/Sepsis Unspecified Organism: presumed due to parapneumonic effusion vs empyema - trend lactate - Continue current antibiotics - follow-up cultures  -Gentle hydration normal saline 50 ml/hr  Acute hypoxic respiratory failure; Large left-sided pleural effusion:  - 6/27S/P Left Thoracentesis CXR significant decrease in effusion, some residual LLE effusion.  - Patient recently discharged from SNF and hospitalization continue current antibiotics for 7 days -Blood cultures from [facility] are pending; sputum/urine culture pending  -CPAP/BiPAP QHS  COPD: Exacerbation -  reportedly has COPD per [facility] records; patient denies this but states he is on nebulizer treatments at home - continue Pulmicort and Brovana nebs BID and Duonebs q6 hours.    Chronic Systolic CHF -Per patient base weight~185 pounds (84 kg) -Strict in and out -Daily weight Filed Weights   02/08/17 2338 02/09/17 0357 02/10/17 0500  Weight: 166 lb 0.1 oz (75.3 kg) 164 lb 7.4 oz (74.6 kg) 167 lb 1.7 oz (75.8 kg)  -Hold BP medication for now (soft BP), restart at lower dose possibly in A.m. -Transfuse for hemoglobin<8  CAD/HTN: - clinically appears dry at this time, so will hold off on diuretics at this time.  - 6/28 restart Brilinta and ASA  AKI Lab Results  Component Value Date   CREATININE 0.86 02/10/2017   CREATININE 1.23 02/09/2017  -Resolved   Anemia:  -Unclear baseline.  -No obvious blood loss.  -Continue to monitor.        DVT prophylaxis: SCD Code Status:  Family Communication: Full Disposition Plan: Resolution sepsis   Consultants:  [facility] M    Procedures/Significant Events:  6/26 MRSA by PCR positive 6/27 left pleural fluid pending 6/27 Left Thoracentesis: 2.2 L Frank dark maroon blood / pleural fluid was obtained 6/27 sputum pending 6/28 urine strep pneumo pending 6/28 urine Legionella pending    VENTILATOR SETTINGS: None   Cultures 6/26 MRSA by PCR positive 6/27 Left Pleural Fluid pending    Antimicrobials: Anti-infectives    Start     Dose/Rate Route Frequency Ordered Stop   02/10/17 1500  vancomycin (VANCOCIN) IVPB 750 mg/150 ml premix     750 mg 150 mL/hr over 60 Minutes Intravenous Every 8  hours 02/10/17 0748     02/09/17 2000  levofloxacin (LEVAQUIN) IVPB 750 mg  Status:  Discontinued     750 mg 100 mL/hr over 90 Minutes Intravenous Every 24 hours 02/09/17 0132 02/09/17 1036   02/09/17 1800  vancomycin (VANCOCIN) IVPB 1000 mg/200 mL premix  Status:  Discontinued     1,000 mg 200 mL/hr over 60 Minutes Intravenous  Every 12 hours 02/09/17 0132 02/10/17 0748   02/09/17 0230  vancomycin (VANCOCIN) 1,500 mg in sodium chloride 0.9 % 500 mL IVPB     1,500 mg 250 mL/hr over 120 Minutes Intravenous  Once 02/09/17 0150 02/09/17 0431   02/09/17 0200  meropenem (MERREM) 1 g in sodium chloride 0.9 % 100 mL IVPB     1 g 200 mL/hr over 30 Minutes Intravenous Every 8 hours 02/09/17 0132 02/16/17 0159       Devices    LINES / TUBES:      Continuous Infusions: . lactated ringers 50 mL/hr at 02/09/17 1215  . meropenem (MERREM) IV Stopped (02/10/17 0251)  . vancomycin Stopped (02/10/17 1478)     Objective: Vitals:   02/10/17 0349 02/10/17 0400 02/10/17 0500 02/10/17 0600  BP:  107/77 111/85 116/89  Pulse:  79 81 84  Resp:  18 (!) 21 17  Temp: 97.7 F (36.5 C)     TempSrc: Axillary     SpO2:  97% 97% 98%  Weight:   167 lb 1.7 oz (75.8 kg)   Height:        Intake/Output Summary (Last 24 hours) at 02/10/17 0710 Last data filed at 02/10/17 0644  Gross per 24 hour  Intake           2057.5 ml  Output             1480 ml  Net            577.5 ml   Filed Weights   02/08/17 2338 02/09/17 0357 02/10/17 0500  Weight: 166 lb 0.1 oz (75.3 kg) 164 lb 7.4 oz (74.6 kg) 167 lb 1.7 oz (75.8 kg)    Examination:  General: A/O 4, positive acute respiratory distress (improved from last night), cachectic Eyes: negative scleral hemorrhage, negative anisocoria, negative icterus ENT: Negative Runny nose, negative gingival bleeding, Neck:  Negative scars, masses, torticollis, lymphadenopathy, JVD Lungs: Clear to auscultation bilaterally, except decreased/absent breath sounds LLL, negative wheezes or crackles, left chest wall pacer. Cardiovascular: Regular rate and rhythm without murmur gallop or rub normal S1 and S2 Abdomen: negative abdominal pain, nondistended, positive soft, bowel sounds, no rebound, no ascites, no appreciable mass Extremities: No significant cyanosis, clubbing, or edema bilateral lower  extremities Skin: Negative rashes, lesions, ulcers Psychiatric:  Negative depression, negative anxiety, negative fatigue, negative mania  Central nervous system:  Cranial nerves II through XII intact, tongue/uvula midline, all extremities muscle strength 5/5, sensation intact throughout, negative dysarthria, negative expressive aphasia, negative receptive aphasia.  .     Data Reviewed: Care during the described time interval was provided by me .  I have reviewed this patient's available data, including medical history, events of note, physical examination, and all test results as part of my evaluation. I have personally reviewed and interpreted all radiology studies.  CBC:  Recent Labs Lab 02/09/17 0245 02/09/17 2227 02/10/17 0320  WBC 10.1  --  15.4*  HGB 9.4*  --  8.7*  HCT 30.2* 28.9* 28.4*  MCV 88.0  --  86.3  PLT 234  --  228  Basic Metabolic Panel:  Recent Labs Lab 02/09/17 0245 02/10/17 0320  NA 135 135  K 3.6 4.0  CL 100* 102  CO2 22 26  GLUCOSE 197* 101*  BUN 26* 21*  CREATININE 1.23 0.86  CALCIUM 8.9 8.4*  MG 1.6*  --   PHOS 4.4  --    GFR: Estimated Creatinine Clearance: 92 mL/min (by C-G formula based on SCr of 0.86 mg/dL). Liver Function Tests:  Recent Labs Lab 02/09/17 0245  PROT 6.7  ALBUMIN 2.8*   No results for input(s): LIPASE, AMYLASE in the last 168 hours. No results for input(s): AMMONIA in the last 168 hours. Coagulation Profile:  Recent Labs Lab 02/09/17 1207  INR 1.51   Cardiac Enzymes: No results for input(s): CKTOTAL, CKMB, CKMBINDEX, TROPONINI in the last 168 hours. BNP (last 3 results) No results for input(s): PROBNP in the last 8760 hours. HbA1C: No results for input(s): HGBA1C in the last 72 hours. CBG:  Recent Labs Lab 02/09/17 1214 02/09/17 1617 02/09/17 2012 02/09/17 2339 02/10/17 0347  GLUCAP 198* 171* 212* 112* 95   Lipid Profile: No results for input(s): CHOL, HDL, LDLCALC, TRIG, CHOLHDL, LDLDIRECT in  the last 72 hours. Thyroid Function Tests: No results for input(s): TSH, T4TOTAL, FREET4, T3FREE, THYROIDAB in the last 72 hours. Anemia Panel: No results for input(s): VITAMINB12, FOLATE, FERRITIN, TIBC, IRON, RETICCTPCT in the last 72 hours. Urine analysis: No results found for: COLORURINE, APPEARANCEUR, LABSPEC, [place], GLUCOSEU, HGBUR, BILIRUBINUR, KETONESUR, PROTEINUR, UROBILINOGEN, NITRITE, LEUKOCYTESUR Sepsis Labs: @LABRCNTIP (procalcitonin:4,lacticidven:4)  ) Recent Results (from the past 240 hour(s))  MRSA PCR Screening     Status: Abnormal   Collection Time: 02/08/17 11:49 PM  Result Value Ref Range Status   MRSA by PCR POSITIVE (A) NEGATIVE Final    Comment:        The GeneXpert MRSA Assay (FDA approved for NASAL specimens only), is one component of a comprehensive MRSA colonization surveillance program. It is not intended to diagnose MRSA infection nor to guide or monitor treatment for MRSA infections. RESULT CALLED TO, READ BACK BY AND VERIFIED WITH: C.EUBANKS,RN AT 0213 BY L.PITT 02/09/17   Body fluid culture (includes gram stain)     Status: None (Preliminary result)   Collection Time: 02/09/17  2:20 AM  Result Value Ref Range Status   Specimen Description FLUID PLEURAL  Final   Special Requests PENDING  Incomplete   Gram Stain   Final    FEW WBC PRESENT, PREDOMINANTLY MONONUCLEAR NO ORGANISMS SEEN    Culture PENDING  Incomplete   Report Status PENDING  Incomplete         Radiology Studies: Ct Chest Wo Contrast  Result Date: 02/09/2017 CLINICAL DATA:  Evaluate pleural effusion EXAM: CT CHEST WITHOUT CONTRAST TECHNIQUE: Multidetector CT imaging of the chest was performed following the standard protocol without IV contrast. COMPARISON:  Earlier same day FINDINGS: Cardiovascular: Marked cardiomegaly. No pericardial effusion. Left anterior chest wall pacer leads terminate within the right atrium and ventricle. Post coronary artery stent placement. Mild  ectasia of the ascending thoracic aorta measuring 30 mm in diameter. The main pulmonary artery is enlarged measuring 3.5 mm in diameter. Mediastinum/Nodes: Bulky mediastinal adenopathy with index right paratracheal lymph node measuring 3.5 cm in greatest short axis diameter (image 30, series 3) and index precarinal lymph node measuring 3 cm. Suspected right hilar adenopathy though this is suboptimally evaluated on this noncontrast examination. No axillary adenopathy. Lungs/Pleura: Interval reduction in persistent small left-sided pleural effusion post thoracentesis. No pneumothorax. A  small amount of fluid is again seen tracking within the left major fissure. Mild apical predominant centrilobular emphysematous change. No pneumothorax. Improved aeration of the left lung with persistent perihilar ground-glass opacities and left basilar consolidative opacities with associated air bronchograms. Minimal right basilar heterogeneous opacities favored to represent atelectasis. Punctate (approximately 2 mm) calcified granuloma within the left upper lobe (image 16, series 4). No discrete pulmonary nodules or masses given limitation of the examination. Upper Abdomen: Limited noncontrast evaluation of the upper abdomen is unremarkable. Musculoskeletal: No acute or aggressive osseous abnormalities. IMPRESSION: 1. Interval reduction in persistent small left-sided effusion post thoracentesis. No pneumothorax. 2. Improved aeration of the left lung with persistent left perihilar and basilar opacities, nonspecific with differential considerations including asymmetric pulmonary edema versus infection (including atypical etiologies). 3. Bulky mediastinal adenopathy, the etiology of which is not depicted on this examination, specifically, there is no discrete pulmonary nodule or mass identified on this noncontrast examination. Further evaluation with contrast-enhanced CT of the chest, abdomen and pelvis versus outpatient PET-CT could be  performed as clinically indicated. Alternatively, bronchoscopic biopsy could be performed as indicated. 4. Cardiomegaly. 5. Aortic Atherosclerosis (ICD10-I70.0) and Emphysema (ICD10-J43.9). Electronically Signed   By: Sandi Mariscal M.D.   On: 02/09/2017 17:25   Dg Chest Port 1 View  Result Date: 02/09/2017 CLINICAL DATA:  Thoracentesis EXAM: PORTABLE CHEST 1 VIEW COMPARISON:  02/09/2017 FINDINGS: Substantial reduction of left pleural fluid volume. No pneumothorax. Mild residual left base opacity likely represents a combination of consolidation and residual pleural fluid. The right lung is clear. Moderate cardiomegaly persists. Grossly intact appearances of the transvenous cardiac leads. IMPRESSION: Substantial reduction of pleural fluid volume after left thoracentesis. No pneumothorax. Electronically Signed   By: Andreas Newport M.D.   On: 02/09/2017 03:11   Dg Chest Port 1 View  Result Date: 02/09/2017 CLINICAL DATA:  61 y/o  M; shortness of breath. EXAM: PORTABLE CHEST 1 VIEW COMPARISON:  None. FINDINGS: Near complete opacification of the left lung probably representing a large effusion lung consolidation. Grossly clear right lung. The cardiac silhouette is largely obscured by opacification of the left hemithorax. Partially visualized 2 lead pacemaker. The patient's chin obscures the lung apices. No acute osseous abnormality is evident. IMPRESSION: Complete opacification of the left lung probably representing large effusion and lung consolidation. Electronically Signed   By: Kristine Garbe M.D.   On: 02/09/2017 01:22        Scheduled Meds: . arformoterol  15 mcg Nebulization BID  . budesonide (PULMICORT) nebulizer solution  0.5 mg Nebulization BID  . Chlorhexidine Gluconate Cloth  6 each Topical Q0600  . insulin aspart  2-6 Units Subcutaneous Q4H  . ipratropium-albuterol  3 mL Nebulization Q6H  . mupirocin ointment  1 application Nasal BID  . pantoprazole (PROTONIX) IV  40 mg  Intravenous QHS   Continuous Infusions: . lactated ringers 50 mL/hr at 02/09/17 1215  . meropenem (MERREM) IV Stopped (02/10/17 0251)  . vancomycin Stopped (02/10/17 0644)     LOS: 2 days    Time spent: 40 minutes    WOODS, Geraldo Docker, MD [facility] Pager [phone]   If 7PM-7AM, please contact night-coverage [email or web address] Password [email or web address] 02/10/2017, 7:10 AM

## 2017-02-10 NOTE — Progress Notes (Signed)
Received patient from 89M, alert and oriented, dressing left Upper back clean dry and intact. Agree with previous RN's assessment.

## 2017-02-10 NOTE — Progress Notes (Signed)
MD, could u take off q 4 hr CBG and coverage, pt is now on ACHS CBG's and S/S thanks Arvella Nigh RN

## 2017-02-10 NOTE — Progress Notes (Signed)
Pharmacy Antibiotic Note  Stephen Cardenas is a 61 y.o. male admitted on 02/08/2017 with SOB and respiratory failure/sepsis. Pharmacy has been consulted for Vancomycin and Meropenem dosing. Pt is afebrile and WBC is elevated at 15.4. SCr has improved to 0.86.   Plan: Change vancomycin to 750mg  IV Q8H Continue meropenem 1g IV Q8H F/u renal fxn, C&S, clinical status and trough at [facility] De-escalate as able   Height: 5\' 10"  (177.8 cm) Weight: 167 lb 1.7 oz (75.8 kg) IBW/kg (Calculated) : 73  Temp (24hrs), Avg:97.8 F (36.6 C), Min:97.2 F (36.2 C), Max:98.5 F (36.9 C)   Recent Labs Lab 02/09/17 0245 02/09/17 0925 02/10/17 0320  WBC 10.1  --  15.4*  CREATININE 1.23  --  0.86  LATICACIDVEN 5.4* 4.3*  --     Estimated Creatinine Clearance: 92 mL/min (by C-G formula based on SCr of 0.86 mg/dL).    Allergies  Allergen Reactions  . Codeine Other (See Comments)    Increased work of breathing, diaphoretic   Vanc 6/27>> Meropenem 6/27>> Levaquin 6/26>>6/27 Zosyn x 1 6/26  6/26 MRSA - POS 6/27 Pleural fluid - NGTD  Cara Thaxton, Rande Lawman 02/10/2017 7:48 AM

## 2017-02-11 DIAGNOSIS — J918 Pleural effusion in other conditions classified elsewhere: Secondary | ICD-10-CM

## 2017-02-11 DIAGNOSIS — J189 Pneumonia, unspecified organism: Secondary | ICD-10-CM

## 2017-02-11 LAB — CBC
HEMATOCRIT: 30.1 % — AB (ref 39.0–52.0)
HEMOGLOBIN: 9 g/dL — AB (ref 13.0–17.0)
MCH: 26.6 pg (ref 26.0–34.0)
MCHC: 29.9 g/dL — ABNORMAL LOW (ref 30.0–36.0)
MCV: 89.1 fL (ref 78.0–100.0)
Platelets: 250 10*3/uL (ref 150–400)
RBC: 3.38 MIL/uL — AB (ref 4.22–5.81)
RDW: 18.3 % — ABNORMAL HIGH (ref 11.5–15.5)
WBC: 11.9 10*3/uL — AB (ref 4.0–10.5)

## 2017-02-11 LAB — IRON AND TIBC
IRON: 43 ug/dL — AB (ref 45–182)
Saturation Ratios: 10 % — ABNORMAL LOW (ref 17.9–39.5)
TIBC: 417 ug/dL (ref 250–450)
UIBC: 374 ug/dL

## 2017-02-11 LAB — MAGNESIUM: MAGNESIUM: 2 mg/dL (ref 1.7–2.4)

## 2017-02-11 LAB — BASIC METABOLIC PANEL
Anion gap: 8 (ref 5–15)
BUN: 22 mg/dL — ABNORMAL HIGH (ref 6–20)
CHLORIDE: 101 mmol/L (ref 101–111)
CO2: 25 mmol/L (ref 22–32)
Calcium: 8.5 mg/dL — ABNORMAL LOW (ref 8.9–10.3)
Creatinine, Ser: 0.99 mg/dL (ref 0.61–1.24)
GFR calc non Af Amer: >60 mL/min (ref >60)
Glucose, Bld: 92 mg/dL (ref 65–99)
POTASSIUM: 4.5 mmol/L (ref 3.5–5.1)
SODIUM: 134 mmol/L — AB (ref 135–145)

## 2017-02-11 LAB — GLUCOSE, CAPILLARY
GLUCOSE-CAPILLARY: 119 mg/dL — AB (ref 65–99)
GLUCOSE-CAPILLARY: 104 mg/dL — AB (ref 65–99)
Glucose-Capillary: 97 mg/dL (ref 65–99)
Glucose-Capillary: 164 mg/dL — ABNORMAL HIGH (ref 65–99)
Glucose-Capillary: 102 mg/dL — ABNORMAL HIGH (ref 65–99)

## 2017-02-11 LAB — LEGIONELLA PNEUMOPHILA SEROGP 1 UR AG: L. pneumophila Serogp 1 Ur Ag: NEGATIVE

## 2017-02-11 LAB — CHOLESTEROL, BODY FLUID: CHOL FL: 54 mg/dL

## 2017-02-11 MED ORDER — ARFORMOTEROL TARTRATE 15 MCG/2ML IN NEBU: 15.0000 ug | INHALATION_SOLUTION | Freq: Two times a day (BID) | RESPIRATORY_TRACT | Status: DC

## 2017-02-11 MED ORDER — ALBUTEROL SULFATE (2.5 MG/3ML) 0.083% IN NEBU
2.5000 mg | INHALATION_SOLUTION | RESPIRATORY_TRACT | Status: DC | PRN
  Administered 2017-02-11: 2.5 mg via RESPIRATORY_TRACT
  Filled 2017-02-11: qty 3

## 2017-02-11 MED ORDER — DULOXETINE HCL 60 MG PO CPEP
60.0000 mg | ORAL_CAPSULE | Freq: Every day | ORAL | Status: DC
  Administered 2017-02-11 – 2017-02-23 (×12): 60 mg via ORAL
  Filled 2017-02-11 (×12): qty 1

## 2017-02-11 MED ORDER — NITROGLYCERIN 0.4 MG SL SUBL
SUBLINGUAL_TABLET | SUBLINGUAL | Status: AC
  Administered 2017-02-11: 21:00:00
  Filled 2017-02-11: qty 1

## 2017-02-11 MED ORDER — POTASSIUM CHLORIDE CRYS ER 10 MEQ PO TBCR
10.0000 meq | EXTENDED_RELEASE_TABLET | Freq: Every day | ORAL | Status: DC
  Administered 2017-02-12: 10 meq via ORAL
  Filled 2017-02-11: qty 1

## 2017-02-11 MED ORDER — VITAMIN B-12 1000 MCG PO TABS
2000.0000 ug | ORAL_TABLET | Freq: Every day | ORAL | Status: DC
  Administered 2017-02-11 – 2017-02-23 (×13): 2000 ug via ORAL
  Filled 2017-02-11 (×13): qty 2

## 2017-02-11 MED ORDER — ALPRAZOLAM 0.5 MG PO TABS: 0.5000 mg | ORAL_TABLET | Freq: Every evening | ORAL | Status: DC | PRN

## 2017-02-11 MED ORDER — FUROSEMIDE 10 MG/ML IJ SOLN
20.0000 mg | Freq: Two times a day (BID) | INTRAMUSCULAR | Status: DC
  Administered 2017-02-11 (×2): 20 mg via INTRAVENOUS
  Filled 2017-02-11 (×2): qty 2

## 2017-02-11 MED ORDER — LEVOTHYROXINE SODIUM 75 MCG PO TABS
150.0000 ug | ORAL_TABLET | Freq: Every day | ORAL | Status: DC
  Administered 2017-02-12 – 2017-02-23 (×11): 150 ug via ORAL
  Filled 2017-02-11 (×11): qty 2

## 2017-02-11 MED ORDER — DOCUSATE SODIUM 100 MG PO CAPS
100.0000 mg | ORAL_CAPSULE | Freq: Every day | ORAL | Status: DC
  Administered 2017-02-11 – 2017-02-23 (×5): 100 mg via ORAL
  Filled 2017-02-11 (×11): qty 1

## 2017-02-11 MED ORDER — VITAMIN D 1000 UNITS PO TABS
2000.0000 [IU] | ORAL_TABLET | Freq: Every day | ORAL | Status: DC
  Administered 2017-02-11 – 2017-02-23 (×13): 2000 [IU] via ORAL
  Filled 2017-02-11 (×15): qty 2

## 2017-02-11 NOTE — Progress Notes (Signed)
SATURATION QUALIFICATIONS: (This note is used to comply with regulatory documentation for home oxygen)  Patient Saturations on Room Air at Rest = 85-92%  Patient Saturations on Room Air while Ambulating = 83-88%  Patient Saturations on 2.5 Liters of oxygen while Ambulating = 91-95%  Please briefly explain why patient needs home oxygen:Desats on RA with talking and with transitions.  May need home O2.  Thanks.  Casey [phone] (pager)

## 2017-02-11 NOTE — Plan of Care (Signed)
Patient c/o of dyspnea and dull chest pain. EKG done and nitroglycerin given x 2 with relief of chest pain.  When evaluated patient was still reporting dyspnea and discomfort of inspiration. Also anxious.  Physical exam Vitals:   02/11/17 2044 02/11/17 2049  BP: (!) 133/99 (!) 117/91  Pulse: 100 97  Resp:    Temp:      General : alert oriented, mild distress  Lungs: crackles in bases bilaterally Heart: tachycardic rate/ regular rhythm Extremities: no edema noted.  EKG: Preliminary: Sinus tachycardia Non-specific intra-ventricular conduction block Lateral infarct , age undetermined Inferior infarct , age undetermined  IMPRESSION AND PLAN  Non specific chest pain:  Continue current management. Evaluate troponin.

## 2017-02-11 NOTE — Progress Notes (Signed)
Walden Field was notified of C/P as well as EKG & V/S.

## 2017-02-11 NOTE — Progress Notes (Signed)
Patient C/O dull C/P rating 6/10. EKG done. BP 197/102 HR 102 now 97% on 4L. First Nitro given @ 2140.

## 2017-02-11 NOTE — Progress Notes (Addendum)
PROGRESS NOTE                                                                                                                                                                                                             Patient Demographics:    Stephen Cardenas, is a 61 y.o. male, DOB - Mar 18, 1956, XNT:[id number]  Admit date - 02/08/2017   Admitting Physician Collene Gobble, MD  Outpatient Primary MD for the patient is System, Pcp Not In  LOS - 3  Outpatient Specialists:Cardiologist in [place]  No chief complaint on file.      Brief Narrative   61 year old male with chronic systolic CHF (EF of 96%), CAD with history of MI, AICD, COPD, OSA on CPAP, hypertension, hypothyroidism, recently treated for HCT AP and multiple recent hospitalization who was discharged from [facility] 3 weeks back and then subsequently admitted to [facility], discharged to rehabilitation from there to rehabilitation with failure to thrive presented to the ED with increasing shortness of breath. EMS was called and patient taken to [facility] and placed on BiPAP. Chest x-ray showed large lateral left pleural effusion. He received IV Zosyn and Levaquin in the hospital. CT chest was unable to be obtained since patient unable to lie down flat. He was then referred to critical care at [facility] and transferred here for further management. Patient admitted for sepsis possibly secondary to left parapneumonic effusion.   Subjective:   Patient reports feeling tired and feels increased short of breath this morning. Denies chest pain symptoms.   Assessment  & Plan :   Acute respiratory failure with hypoxia (HCC) Secondary to large left pleural effusion. Underwent left thoracentesis on 6/27 with improvement. Being treated empirically for HPAP with parapneumonic effusion. Blood cultures sent from [facility] have not been  available yet. Cultures from thoracentesis without any growth to date. Continue O2 via nasal cannula. CPAP at night.  Severe sepsis ([place]) secondary to pneumonia with parapneumonic effusion. On empiric vancomycin and meropenem. Sepsis currently resolved. Narrow antibiotics in a.m. if final culture from pleural fluid negative.  Acute on chronic systolic CHF (HCC) Appears congested bibasilar crackles on exam today. Was getting gentle IV hydration for dehydration and sepsis. DC IV fluids and start him on low-dose IV Lasix. Monitor strict I/O and daily weight. Resume Coreg at lower  dose. Continue aspirin. I will also add low-dose lisinopril.  COPD Continue Pulmicort and Brovana. When necessary albuterol nebs.  Acute kidney injury Possibly due to sepsis. Resolved.  Coronary artery disease History of PCI. Continue aspirin and Brilliant. Check lipid panel in a.m.  Anemia Baseline hemoglobin not known. Check iron panel. On B12 supplement.   Code Status : Full code  Family Communication  : None at bedside  Disposition Plan  : Possibly needs SNF  Barriers For Discharge : Active symptoms  Consults  :  PC CM  Procedures  :  CT chest Left thoracentesis  DVT Prophylaxis  :  Lovenox -  Lab Results  Component Value Date   PLT 250 02/11/2017    Antibiotics  :    Anti-infectives    Start     Dose/Rate Route Frequency Ordered Stop   02/10/17 1500  vancomycin (VANCOCIN) IVPB 750 mg/150 ml premix     750 mg 150 mL/hr over 60 Minutes Intravenous Every 8 hours 02/10/17 0748     02/09/17 2000  levofloxacin (LEVAQUIN) IVPB 750 mg  Status:  Discontinued     750 mg 100 mL/hr over 90 Minutes Intravenous Every 24 hours 02/09/17 0132 02/09/17 1036   02/09/17 1800  vancomycin (VANCOCIN) IVPB 1000 mg/200 mL premix  Status:  Discontinued     1,000 mg 200 mL/hr over 60 Minutes Intravenous Every 12 hours 02/09/17 0132 02/10/17 0748   02/09/17 0230  vancomycin (VANCOCIN) 1,500 mg in sodium  chloride 0.9 % 500 mL IVPB     1,500 mg 250 mL/hr over 120 Minutes Intravenous  Once 02/09/17 0150 02/09/17 0431   02/09/17 0200  meropenem (MERREM) 1 g in sodium chloride 0.9 % 100 mL IVPB     1 g 200 mL/hr over 30 Minutes Intravenous Every 8 hours 02/09/17 0132 02/16/17 0159        Objective:   Vitals:   02/11/17 0847 02/11/17 0849 02/11/17 1104 02/11/17 1152  BP:    133/89  Pulse:    92  Resp:   (!) 24 19  Temp:    97.5 F (36.4 C)  TempSrc:    Oral  SpO2: 96% 96%  97%  Weight:      Height:        Wt Readings from Last 3 Encounters:  02/11/17 78.4 kg (172 lb 14.4 oz)     Intake/Output Summary (Last 24 hours) at 02/11/17 1423 Last data filed at 02/11/17 1339  Gross per 24 hour  Intake          2706.67 ml  Output             2470 ml  Net           236.67 ml     Physical Exam Gen.: Middle-aged male appears fatigued HEENT: Mild JVD, moist mucosa, supple neck Chest: Bibasilar crackles, no rhonchi or wheeze  CVS: Normal S1 and S2, no murmurs rub or gallop GI: soft, NT, ND, BS+ Musculoskeletal: warm, no edema     Data Review:    CBC  Recent Labs Lab 02/09/17 0245 02/09/17 2227 02/10/17 0320 02/11/17 0340  WBC 10.1  --  15.4* 11.9*  HGB 9.4*  --  8.7* 9.0*  HCT 30.2* 28.9* 28.4* 30.1*  PLT 234  --  228 250  MCV 88.0  --  86.3 89.1  MCH 27.4  --  26.4 26.6  MCHC 31.1  --  30.6 29.9*  RDW 17.8*  --  17.6*  18.3*    Chemistries   Recent Labs Lab 02/09/17 0245 02/10/17 0320 02/11/17 0340  NA 135 135 134*  K 3.6 4.0 4.5  CL 100* 102 101  CO2 22 26 25   GLUCOSE 197* 101* 92  BUN 26* 21* 22*  CREATININE 1.23 0.86 0.99  CALCIUM 8.9 8.4* 8.5*  MG 1.6*  --  2.0   ------------------------------------------------------------------------------------------------------------------ No results for input(s): CHOL, HDL, LDLCALC, TRIG, CHOLHDL, LDLDIRECT in the last 72 hours.  No results found for:  HGBA1C ------------------------------------------------------------------------------------------------------------------ No results for input(s): TSH, T4TOTAL, T3FREE, THYROIDAB in the last 72 hours.  Invalid input(s): FREET3 ------------------------------------------------------------------------------------------------------------------ No results for input(s): VITAMINB12, FOLATE, FERRITIN, TIBC, IRON, RETICCTPCT in the last 72 hours.  Coagulation profile  Recent Labs Lab 02/09/17 1207  INR 1.51    No results for input(s): DDIMER in the last 72 hours.  Cardiac Enzymes No results for input(s): CKMB, TROPONINI, MYOGLOBIN in the last 168 hours.  Invalid input(s): CK ------------------------------------------------------------------------------------------------------------------ No results found for: BNP  Inpatient Medications  Scheduled Meds: . arformoterol  15 mcg Nebulization BID  . aspirin EC  81 mg Oral Daily  . budesonide (PULMICORT) nebulizer solution  0.5 mg Nebulization BID  . Chlorhexidine Gluconate Cloth  6 each Topical Q0600  . furosemide  20 mg Intravenous Q12H  . insulin aspart  0-5 Units Subcutaneous QHS  . insulin aspart  0-9 Units Subcutaneous TID WC  . insulin aspart  2-6 Units Subcutaneous Q4H  . ipratropium-albuterol  3 mL Nebulization BID  . mupirocin ointment  1 application Nasal BID  . pantoprazole (PROTONIX) IV  40 mg Intravenous QHS  . ticagrelor  90 mg Oral BID   Continuous Infusions: . meropenem (MERREM) IV 1 g (02/11/17 1145)  . vancomycin Stopped (02/11/17 0707)   PRN Meds:.albuterol, ALPRAZolam  Micro Results Recent Results (from the past 240 hour(s))  MRSA PCR Screening     Status: Abnormal   Collection Time: 02/08/17 11:49 PM  Result Value Ref Range Status   MRSA by PCR POSITIVE (A) NEGATIVE Final    Comment:        The GeneXpert MRSA Assay (FDA approved for NASAL specimens only), is one component of a comprehensive MRSA  colonization surveillance program. It is not intended to diagnose MRSA infection nor to guide or monitor treatment for MRSA infections. RESULT CALLED TO, READ BACK BY AND VERIFIED WITH: C.EUBANKS,RN AT 0213 BY L.PITT 02/09/17   Body fluid culture (includes gram stain)     Status: None (Preliminary result)   Collection Time: 02/09/17  2:20 AM  Result Value Ref Range Status   Specimen Description FLUID PLEURAL  Final   Special Requests PENDING  Incomplete   Gram Stain   Final    FEW WBC PRESENT, PREDOMINANTLY MONONUCLEAR NO ORGANISMS SEEN    Culture NO GROWTH 2 DAYS  Final   Report Status PENDING  Incomplete    Radiology Reports Ct Chest Wo Contrast  Result Date: 02/09/2017 CLINICAL DATA:  Evaluate pleural effusion EXAM: CT CHEST WITHOUT CONTRAST TECHNIQUE: Multidetector CT imaging of the chest was performed following the standard protocol without IV contrast. COMPARISON:  Earlier same day FINDINGS: Cardiovascular: Marked cardiomegaly. No pericardial effusion. Left anterior chest wall pacer leads terminate within the right atrium and ventricle. Post coronary artery stent placement. Mild ectasia of the ascending thoracic aorta measuring 30 mm in diameter. The main pulmonary artery is enlarged measuring 3.5 mm in diameter. Mediastinum/Nodes: Bulky mediastinal adenopathy with index right paratracheal lymph node measuring 3.5  cm in greatest short axis diameter (image 30, series 3) and index precarinal lymph node measuring 3 cm. Suspected right hilar adenopathy though this is suboptimally evaluated on this noncontrast examination. No axillary adenopathy. Lungs/Pleura: Interval reduction in persistent small left-sided pleural effusion post thoracentesis. No pneumothorax. A small amount of fluid is again seen tracking within the left major fissure. Mild apical predominant centrilobular emphysematous change. No pneumothorax. Improved aeration of the left lung with persistent perihilar ground-glass  opacities and left basilar consolidative opacities with associated air bronchograms. Minimal right basilar heterogeneous opacities favored to represent atelectasis. Punctate (approximately 2 mm) calcified granuloma within the left upper lobe (image 16, series 4). No discrete pulmonary nodules or masses given limitation of the examination. Upper Abdomen: Limited noncontrast evaluation of the upper abdomen is unremarkable. Musculoskeletal: No acute or aggressive osseous abnormalities. IMPRESSION: 1. Interval reduction in persistent small left-sided effusion post thoracentesis. No pneumothorax. 2. Improved aeration of the left lung with persistent left perihilar and basilar opacities, nonspecific with differential considerations including asymmetric pulmonary edema versus infection (including atypical etiologies). 3. Bulky mediastinal adenopathy, the etiology of which is not depicted on this examination, specifically, there is no discrete pulmonary nodule or mass identified on this noncontrast examination. Further evaluation with contrast-enhanced CT of the chest, abdomen and pelvis versus outpatient PET-CT could be performed as clinically indicated. Alternatively, bronchoscopic biopsy could be performed as indicated. 4. Cardiomegaly. 5. Aortic Atherosclerosis (ICD10-I70.0) and Emphysema (ICD10-J43.9). Electronically Signed   By: Sandi Mariscal M.D.   On: 02/09/2017 17:25   Dg Chest Port 1 View  Result Date: 02/10/2017 CLINICAL DATA:  Recent thoracentesis, cough, shortness of breath, CHF EXAM: PORTABLE CHEST 1 VIEW COMPARISON:  02/09/2017 FINDINGS: Patient is status post left thoracentesis. No pneumothorax. Left basilar partial collapse/ consolidation persist. Stable cardiomegaly with central vascular congestion. Right lung remains clear. Left subclavian pacer noted. Trachea is midline. IMPRESSION: Negative for pneumothorax following thoracentesis. Persistent left lower lobe partial collapse / consolidation.  Electronically Signed   By: Jerilynn Mages.  Shick M.D.   On: 02/10/2017 07:56   Dg Chest Port 1 View  Result Date: 02/09/2017 CLINICAL DATA:  Thoracentesis EXAM: PORTABLE CHEST 1 VIEW COMPARISON:  02/09/2017 FINDINGS: Substantial reduction of left pleural fluid volume. No pneumothorax. Mild residual left base opacity likely represents a combination of consolidation and residual pleural fluid. The right lung is clear. Moderate cardiomegaly persists. Grossly intact appearances of the transvenous cardiac leads. IMPRESSION: Substantial reduction of pleural fluid volume after left thoracentesis. No pneumothorax. Electronically Signed   By: Andreas Newport M.D.   On: 02/09/2017 03:11   Dg Chest Port 1 View  Result Date: 02/09/2017 CLINICAL DATA:  61 y/o  M; shortness of breath. EXAM: PORTABLE CHEST 1 VIEW COMPARISON:  None. FINDINGS: Near complete opacification of the left lung probably representing a large effusion lung consolidation. Grossly clear right lung. The cardiac silhouette is largely obscured by opacification of the left hemithorax. Partially visualized 2 lead pacemaker. The patient's chin obscures the lung apices. No acute osseous abnormality is evident. IMPRESSION: Complete opacification of the left lung probably representing large effusion and lung consolidation. Electronically Signed   By: Kristine Garbe M.D.   On: 02/09/2017 01:22    Time Spent in minutes  35   Louellen Molder M.D on 02/11/2017 at 2:23 PM  Between 7am to 7pm - Pager - [phone]  After 7pm go to [email or web address] - password [facility]  [facility] -  Office  [phone]

## 2017-02-11 NOTE — Care Management Note (Signed)
Case Management Note  Patient Details  Name: Stephen Cardenas MRN: [id number] Date of Birth: 1956/03/24  Subjective/Objective:     Admitted with Acute Resp Failure              Action/Plan: Patient was recently discharged home from [facility] 02/07/2017; deconditioned, possibly needs SNF again; awaiting on PT evals for disposition needs;   Expected Discharge Date:      TBD            Expected Discharge Plan:  Skilled Nursing Facility  In-House Referral:  Clinical Social Work  Discharge planning Services  CM Consult  Status of Service:  In process, will continue to follow  Sherrilyn Rist [phone] 02/11/2017, 10:40 AM

## 2017-02-11 NOTE — Progress Notes (Signed)
BP 133/99,HR100. Patient now rating C/P 4/10. Second nitro given @ 2045

## 2017-02-11 NOTE — Progress Notes (Signed)
Pt is alert and oriented with SOB , Anxiety,02 sat 96, has been off diuretics, IV fluids D/c, expecting Lasix to be resumed per patient.

## 2017-02-11 NOTE — Progress Notes (Signed)
Pt takes Xananx 2 x day at home to help with anxiety, thanks Buckner [place]

## 2017-02-11 NOTE — Evaluation (Addendum)
Physical Therapy Evaluation Patient Details Name: Stephen Cardenas MRN: [id number] DOB: [birth year] Today's Date: 02/11/2017   History of Present Illness  61 year old male with chronic systolic CHF (EF of 99%), CAD with history of MI, AICD, COPD, OSA on CPAP, hypertension, hypothyroidism, recently treated for HCT AP and multiple recent hospitalization who was discharged from [facility] 3 weeks back and then subsequently admitted to [facility], discharged to rehabilitation from there to rehabilitation with failure to thrive presented to the ED with increasing shortness of breath. EMS was called and patient taken to [facility] and placed on BiPAP. Chest x-ray showed large lateral left pleural effusion. He received IV Zosyn and Levaquin in the hospital. CT chest was unable to be obtained since patient unable to lie down flat. He was then referred to critical care at [facility] and transferred here for further management. Patient admitted for sepsis possibly secondary to left parapneumonic effusion.  Clinical Impression  Pt admitted with above diagnosis. Pt currently with functional limitations due to the deficits listed below (see PT Problem List). Pt was able to ambulate with min guard assist.  Pt unsteady and desats when he talks and with transitions.  Will need home O2 most likely.  Also would benefit from SNF for endurance training.   Pt will benefit from skilled PT to increase their independence and safety with mobility to allow discharge to the venue listed below.    SATURATION QUALIFICATIONS: (This note is used to comply with regulatory documentation for home oxygen)  Patient Saturations on Room Air at Rest = 85-92%  Patient Saturations on Room Air while Ambulating = 83-88%  Patient Saturations on 2.5 Liters of oxygen while Ambulating = 91-95%  Please briefly explain why patient needs home oxygen:Desats on RA with talking and with transitions.  May need home O2.    Follow Up Recommendations SNF;Supervision/Assistance - 24 hour    Equipment Recommendations  3in1 (PT)    Recommendations for Other Services       Precautions / Restrictions Precautions Precautions: Fall Restrictions Weight Bearing Restrictions: No      Mobility  Bed Mobility Overal bed mobility: Independent                Transfers Overall transfer level: Independent                  Ambulation/Gait Ambulation/Gait assistance: Min guard Ambulation Distance (Feet): 20 Feet Assistive device: None;1 person hand held assist Gait Pattern/deviations: Step-through pattern;Decreased stride length;Trunk flexed   Gait velocity interpretation: Below normal speed for age/gender General Gait Details: Pt was able to ambulate with +1 min guard assist for lines and occasional steadying assist.   Stairs            Wheelchair Mobility    Modified Rankin (Stroke Patients Only)       Balance Overall balance assessment: Needs assistance Sitting-balance support: No upper extremity supported;Feet supported Sitting balance-Leahy Scale: Good     Standing balance support: No upper extremity supported;During functional activity Standing balance-Leahy Scale: Fair Standing balance comment: stands statically without UE support                             Pertinent Vitals/Pain Pain Assessment: No/denies pain    Home Living Family/patient expects to be discharged to:: Private residence Living Arrangements: Alone Available Help at Discharge: Family;Available 24 hours/day (sisters can assist) Type of Home: House Home Access: Stairs to enter  Entrance Stairs-Rails: None Entrance Stairs-Number of Steps: 4 Home Layout: One level Home Equipment: Cane - single point;Walker - 4 wheels      Prior Function Level of Independence: Independent         Comments: Has been in and out of hospital for last 4 weeks.  Sister had been helping some.      Hand  Dominance        Extremity/Trunk Assessment   Upper Extremity Assessment Upper Extremity Assessment: Defer to OT evaluation    Lower Extremity Assessment Lower Extremity Assessment: Generalized weakness    Cervical / Trunk Assessment Cervical / Trunk Assessment: Normal  Communication   Communication: Other (comment) (SOB caused speech to be hard to understand)  Cognition Arousal/Alertness: Awake/alert Behavior During Therapy: WFL for tasks assessed/performed Overall Cognitive Status: Within Functional Limits for tasks assessed                                        General Comments General comments (skin integrity, edema, etc.): Pt walked to bathroom and had BM.  Total assist toc lean pt     Exercises     Assessment/Plan    PT Assessment Patient needs continued PT services  PT Problem List Decreased strength;Decreased range of motion;Decreased activity tolerance;Decreased balance;Decreased mobility;Decreased knowledge of use of DME;Decreased safety awareness;Decreased knowledge of precautions;Cardiopulmonary status limiting activity       PT Treatment Interventions DME instruction;Gait training;Stair training;Functional mobility training;Therapeutic activities;Therapeutic exercise;Balance training;Patient/family education    PT Goals (Current goals can be found in the Care Plan section)  Acute Rehab PT Goals Patient Stated Goal: to go home PT Goal Formulation: With patient Time For Goal Achievement: 02/25/17 Potential to Achieve Goals: Good    Frequency Min 3X/week   Barriers to discharge        Co-evaluation               AM-PAC PT "6 Clicks" Daily Activity  Outcome Measure Difficulty turning over in bed (including adjusting bedclothes, sheets and blankets)?: None Difficulty moving from lying on back to sitting on the side of the bed? : None Difficulty sitting down on and standing up from a chair with arms (e.g., wheelchair, bedside  commode, etc,.)?: None Help needed moving to and from a bed to chair (including a wheelchair)?: A Little Help needed walking in hospital room?: A Little Help needed climbing 3-5 steps with a railing? : A Little 6 Click Score: 21    End of Session Equipment Utilized During Treatment: Gait belt;Oxygen Activity Tolerance: Patient limited by fatigue Patient left: in bed;with call bell/phone within reach;with family/visitor present Nurse Communication: Mobility status PT Visit Diagnosis: Muscle weakness (generalized) (M62.81);Unsteadiness on feet (R26.81)    Time: 1287-8676 PT Time Calculation (min) (ACUTE ONLY): 24 min   Charges:   PT Evaluation $PT Eval Moderate Complexity: 1 Procedure PT Treatments $Gait Training: 8-22 mins   PT G Codes:        Ramiah Helfrich,PT Acute Rehabilitation [phone] (pager)   Denice Paradise 02/11/2017, 4:03 PM

## 2017-02-12 ENCOUNTER — Inpatient Hospital Stay (HOSPITAL_COMMUNITY): Payer: Medicare Other

## 2017-02-12 DIAGNOSIS — J90 Pleural effusion, not elsewhere classified: Secondary | ICD-10-CM

## 2017-02-12 DIAGNOSIS — R748 Abnormal levels of other serum enzymes: Secondary | ICD-10-CM

## 2017-02-12 DIAGNOSIS — I5023 Acute on chronic systolic (congestive) heart failure: Secondary | ICD-10-CM

## 2017-02-12 DIAGNOSIS — I5022 Chronic systolic (congestive) heart failure: Secondary | ICD-10-CM

## 2017-02-12 DIAGNOSIS — I360 Nonrheumatic tricuspid (valve) stenosis: Secondary | ICD-10-CM

## 2017-02-12 DIAGNOSIS — J9601 Acute respiratory failure with hypoxia: Secondary | ICD-10-CM

## 2017-02-12 DIAGNOSIS — J181 Lobar pneumonia, unspecified organism: Secondary | ICD-10-CM

## 2017-02-12 LAB — GLUCOSE, CAPILLARY
GLUCOSE-CAPILLARY: 126 mg/dL — AB (ref 65–99)
Glucose-Capillary: 124 mg/dL — ABNORMAL HIGH (ref 65–99)
Glucose-Capillary: 103 mg/dL — ABNORMAL HIGH (ref 65–99)
Glucose-Capillary: 114 mg/dL — ABNORMAL HIGH (ref 65–99)
Glucose-Capillary: 155 mg/dL — ABNORMAL HIGH (ref 65–99)

## 2017-02-12 LAB — BRAIN NATRIURETIC PEPTIDE

## 2017-02-12 LAB — TROPONIN I
TROPONIN I: 0.1 ng/mL — AB (ref <0.03)
TROPONIN I: 0.1 ng/mL — AB (ref <0.03)
Troponin I: 0.09 ng/mL (ref <0.03)

## 2017-02-12 LAB — ECHOCARDIOGRAM COMPLETE
HEIGHTINCHES: 70 in
Weight: 2739.2 oz

## 2017-02-12 LAB — BASIC METABOLIC PANEL
Anion gap: 7 (ref 5–15)
BUN: 27 mg/dL — ABNORMAL HIGH (ref 6–20)
CALCIUM: 8.7 mg/dL — AB (ref 8.9–10.3)
CO2: 25 mmol/L (ref 22–32)
CREATININE: 1 mg/dL (ref 0.61–1.24)
Chloride: 100 mmol/L — ABNORMAL LOW (ref 101–111)
Glucose, Bld: 112 mg/dL — ABNORMAL HIGH (ref 65–99)
Potassium: 4.3 mmol/L (ref 3.5–5.1)
Sodium: 132 mmol/L — ABNORMAL LOW (ref 135–145)

## 2017-02-12 LAB — BODY FLUID CULTURE: CULTURE: NO GROWTH

## 2017-02-12 LAB — LIPID PANEL
Cholesterol: 113 mg/dL (ref 0–200)
HDL: 24 mg/dL — ABNORMAL LOW (ref >40)
LDL CALC: 68 mg/dL (ref 0–99)
TRIGLYCERIDES: 106 mg/dL (ref <150)
Total CHOL/HDL Ratio: 4.7 RATIO
VLDL: 21 mg/dL (ref 0–40)

## 2017-02-12 MED ORDER — FUROSEMIDE 10 MG/ML IJ SOLN
80.0000 mg | Freq: Two times a day (BID) | INTRAMUSCULAR | Status: DC
  Administered 2017-02-12 – 2017-02-13 (×3): 80 mg via INTRAVENOUS
  Filled 2017-02-12 (×4): qty 8

## 2017-02-12 MED ORDER — FERROUS SULFATE 325 (65 FE) MG PO TABS
325.0000 mg | ORAL_TABLET | Freq: Two times a day (BID) | ORAL | Status: DC
  Administered 2017-02-12 – 2017-02-17 (×10): 325 mg via ORAL
  Filled 2017-02-12 (×11): qty 1

## 2017-02-12 MED ORDER — FUROSEMIDE 10 MG/ML IJ SOLN
80.0000 mg | Freq: Two times a day (BID) | INTRAMUSCULAR | Status: DC
  Administered 2017-02-12: 80 mg via INTRAVENOUS
  Filled 2017-02-12: qty 8

## 2017-02-12 MED ORDER — ROSUVASTATIN CALCIUM 20 MG PO TABS
20.0000 mg | ORAL_TABLET | Freq: Every day | ORAL | Status: DC
Start: 2017-02-12 — End: 2017-02-23
  Administered 2017-02-12 – 2017-02-23 (×12): 20 mg via ORAL
  Filled 2017-02-12 (×12): qty 1

## 2017-02-12 MED ORDER — SPIRONOLACTONE 25 MG PO TABS
12.5000 mg | ORAL_TABLET | Freq: Every day | ORAL | Status: DC
  Administered 2017-02-13 – 2017-02-20 (×8): 12.5 mg via ORAL
  Filled 2017-02-12 (×8): qty 1

## 2017-02-12 MED ORDER — DIGOXIN 125 MCG PO TABS
0.1250 mg | ORAL_TABLET | Freq: Every day | ORAL | Status: DC
  Administered 2017-02-13 – 2017-02-23 (×10): 0.125 mg via ORAL
  Filled 2017-02-12 (×10): qty 1

## 2017-02-12 MED ORDER — LEVOFLOXACIN 750 MG PO TABS
750.0000 mg | ORAL_TABLET | Freq: Every day | ORAL | Status: AC
  Administered 2017-02-13 – 2017-02-16 (×4): 750 mg via ORAL
  Filled 2017-02-12 (×4): qty 1

## 2017-02-12 MED ORDER — PERFLUTREN LIPID MICROSPHERE
INTRAVENOUS | Status: AC
  Administered 2017-02-12: 2 mL
  Filled 2017-02-12: qty 10

## 2017-02-12 NOTE — Progress Notes (Signed)
Pharmacy Antibiotic Note  Stephen Cardenas is a 61 y.o. male admitted on 02/08/2017 with SOB and respiratory failure/sepsis. Pharmacy consulted for Vancomycin and Meropenem dosing now narrowed to LVQ for CAP with negative pleural fluid cultures. Pt remains afebrile and WBC trend down to 11.9. SCr up to 1.00.   Plan: D/c vancomycin and meropenem Start LVQ at 750 mg q24h  Continue to monitor renal function and for clinical improvement   Height: 5\' 10"  (177.8 cm) Weight: 171 lb 3.2 oz (77.7 kg) (scale b) IBW/kg (Calculated) : 73  Temp (24hrs), Avg:97.8 F (36.6 C), Min:97.5 F (36.4 C), Max:98 F (36.7 C)   Recent Labs Lab 02/09/17 0245 02/09/17 0925 02/10/17 0320 02/10/17 0754 02/11/17 0340 02/12/17 0456  WBC 10.1  --  15.4*  --  11.9*  --   CREATININE 1.23  --  0.86  --  0.99 1.00  LATICACIDVEN 5.4* 4.3*  --  1.5  --   --     Estimated Creatinine Clearance: 79.1 mL/min (by C-G formula based on SCr of 1 mg/dL).    Allergies  Allergen Reactions  . Codeine Other (See Comments)    Increased work of breathing, diaphoretic   Levaquin 6/26>>6/27, 6/30>> Vanc 6/27>>6/30 Meropenem 6/27>>6/30 Zosyn x 1 6/26  6/26 MRSA - POS 6/27 Pleural fluid - NGTD    Stephen Cardenas K. Velva Harman, PharmD, BCPS, CPP Clinical Pharmacist Pager: [phone] Phone: [phone] 02/12/2017 10:27 AM

## 2017-02-12 NOTE — Progress Notes (Signed)
PROGRESS NOTE                                                                                                                                                                                                             Patient Demographics:    Stephen Cardenas, is a 61 y.o. male, DOB - 1956/02/04, PYP:[id number]  Admit date - 02/08/2017   Admitting Physician Collene Gobble, MD  Outpatient Primary MD for the patient is System, Pcp Not In  LOS - 4  Outpatient Specialists:Cardiologist in [place]  No chief complaint on file.      Brief Narrative   61 year old male with chronic systolic CHF (EF of 24%), CAD with history of MI, AICD, COPD, OSA on CPAP, hypertension, hypothyroidism, recently treated for HCT AP and multiple recent hospitalization who was discharged from [facility] 3 weeks back and then subsequently admitted to [facility], discharged to rehabilitation from there to rehabilitation with failure to thrive presented to the ED with increasing shortness of breath. EMS was called and patient taken to [facility] and placed on BiPAP. Chest x-ray showed large lateral left pleural effusion. He received IV Zosyn and Levaquin in the hospital. CT chest was unable to be obtained since patient unable to lie down flat. He was then referred to critical care at [facility] and transferred here for further management. Patient admitted for sepsis possibly secondary to left parapneumonic effusion.   Subjective:   Patient sitting up on the side of the bed as is unable to lie down flat feeling short of breath. Last night complained of dyspnea and dull aching pain in his chest. 2 sublingual nitroglycerin given with some relief. EKG showed age indeterminate inferior and lateral wall infarct. Troponin elevated to 0.10. Denies further chest pain but feels short of breath.   Assessment  & Plan :   Acute respiratory  failure with hypoxia (HCC) Secondary to large left pleural effusion. Underwent left thoracentesis on 6/27 with improvement. Being treated empirically for HPAP with parapneumonic effusion. Blood cultures sent from [facility] have not been available yet. Cultures from thoracentesis without any growth to date. Continue O2 via nasal cannula. CPAP at night.  Severe sepsis ([place]) Resolved. secondary to pneumonia with parapneumonic effusion. Cultures (both blood and pleural fluid) negative. Narrow antibiotics to Levaquin. Treat for total 8 days.  Chest pain? NSTEMI Mildly elevated troponin with nonspecific intraventricular conduction block and age indeterminate inferior lateral infarct on EKG. Mildly elevated troponin of 0.10. Currently pain-free. Continue when necessary nitrate. Check 2-D echo. Cardiology consulted.  Acute on chronic systolic CHF (HCC) Chest x-ray shows mild interstitial edema with persistent small to moderate-sized left effusion with left base atelectasis. Received IV fluids in ICU. I started him on low-dose Lasix yesterday. Will increase dose to ED milligram twice a day. Strict I/O and daily weight. Resumed Coreg at lower dose. Added low-dose lisinopril. Check repeat echo.  COPD Continue Pulmicort and Brovana. When necessary albuterol nebs.  Acute kidney injury Possibly due to sepsis. Resolved.  Coronary artery disease History of PCI. Continue aspirin and Brilinta   Iron deficiency anemia Add supplement. On B12 supplement.   Code Status : Full code  Family Communication  : None at bedside  Disposition Plan  : Possibly needs SNF  Barriers For Discharge : Active symptoms  Consults  :  PC CM  Procedures  :  CT chest Left thoracentesis 2-D echo  DVT Prophylaxis  :  Lovenox -  Lab Results  Component Value Date   PLT 250 02/11/2017    Antibiotics  :    Anti-infectives    Start     Dose/Rate Route Frequency Ordered Stop   02/12/17 1030  levofloxacin  (LEVAQUIN) tablet 750 mg     750 mg Oral Daily 02/12/17 1021     02/10/17 1500  vancomycin (VANCOCIN) IVPB 750 mg/150 ml premix  Status:  Discontinued     750 mg 150 mL/hr over 60 Minutes Intravenous Every 8 hours 02/10/17 0748 02/12/17 0935   02/09/17 2000  levofloxacin (LEVAQUIN) IVPB 750 mg  Status:  Discontinued     750 mg 100 mL/hr over 90 Minutes Intravenous Every 24 hours 02/09/17 0132 02/09/17 1036   02/09/17 1800  vancomycin (VANCOCIN) IVPB 1000 mg/200 mL premix  Status:  Discontinued     1,000 mg 200 mL/hr over 60 Minutes Intravenous Every 12 hours 02/09/17 0132 02/10/17 0748   02/09/17 0230  vancomycin (VANCOCIN) 1,500 mg in sodium chloride 0.9 % 500 mL IVPB     1,500 mg 250 mL/hr over 120 Minutes Intravenous  Once 02/09/17 0150 02/09/17 0431   02/09/17 0200  meropenem (MERREM) 1 g in sodium chloride 0.9 % 100 mL IVPB  Status:  Discontinued     1 g 200 mL/hr over 30 Minutes Intravenous Every 8 hours 02/09/17 0132 02/12/17 0935        Objective:   Vitals:   02/12/17 0020 02/12/17 0613 02/12/17 0705 02/12/17 0756  BP: (!) 129/102 (!) 134/103 130/90   Pulse: (!) 102 (!) 105 (!) 107   Resp: 18 18    Temp: 98 F (36.7 C) 97.8 F (36.6 C)    TempSrc: Oral Oral    SpO2: 96% 96%  97%  Weight:  77.7 kg (171 lb 3.2 oz)    Height:        Wt Readings from Last 3 Encounters:  02/12/17 77.7 kg (171 lb 3.2 oz)     Intake/Output Summary (Last 24 hours) at 02/12/17 1036 Last data filed at 02/12/17 0910  Gross per 24 hour  Intake             1440 ml  Output             1925 ml  Net             -485  ml     Physical Exam Gen.: Appears fatigued   HEENT: JVD +, moist mucosa, supple neck Chest: Diminished breath sounds bilaterally (L>R) CNS: Normal S1 and S2, no murmurs rubs or gallop GI: Soft, nondistended, nontender Musculoskeletal: Warm, no edema     Data Review:    CBC  Recent Labs Lab 02/09/17 0245 02/09/17 2227 02/10/17 0320 02/11/17 0340  WBC 10.1  --   15.4* 11.9*  HGB 9.4*  --  8.7* 9.0*  HCT 30.2* 28.9* 28.4* 30.1*  PLT 234  --  228 250  MCV 88.0  --  86.3 89.1  MCH 27.4  --  26.4 26.6  MCHC 31.1  --  30.6 29.9*  RDW 17.8*  --  17.6* 18.3*    Chemistries   Recent Labs Lab 02/09/17 0245 02/10/17 0320 02/11/17 0340 02/12/17 0456  NA 135 135 134* 132*  K 3.6 4.0 4.5 4.3  CL 100* 102 101 100*  CO2 22 26 25 25   GLUCOSE 197* 101* 92 112*  BUN 26* 21* 22* 27*  CREATININE 1.23 0.86 0.99 1.00  CALCIUM 8.9 8.4* 8.5* 8.7*  MG 1.6*  --  2.0  --    ------------------------------------------------------------------------------------------------------------------  Recent Labs  02/12/17 0456  CHOL 113  HDL 24*  LDLCALC 68  TRIG 106  CHOLHDL 4.7    No results found for: HGBA1C ------------------------------------------------------------------------------------------------------------------ No results for input(s): TSH, T4TOTAL, T3FREE, THYROIDAB in the last 72 hours.  Invalid input(s): FREET3 ------------------------------------------------------------------------------------------------------------------  Recent Labs  02/11/17 1553  TIBC 417  IRON 43*    Coagulation profile  Recent Labs Lab 02/09/17 1207  INR 1.51    No results for input(s): DDIMER in the last 72 hours.  Cardiac Enzymes  Recent Labs Lab 02/12/17 0012 02/12/17 0456  TROPONINI 0.10* 0.10*   ------------------------------------------------------------------------------------------------------------------ No results found for: BNP  Inpatient Medications  Scheduled Meds: . arformoterol  15 mcg Nebulization BID  . aspirin EC  81 mg Oral Daily  . budesonide (PULMICORT) nebulizer solution  0.5 mg Nebulization BID  . Chlorhexidine Gluconate Cloth  6 each Topical Q0600  . cholecalciferol  2,000 Units Oral Daily  . docusate sodium  100 mg Oral Daily  . DULoxetine  60 mg Oral Daily  . furosemide  80 mg Intravenous BID  . insulin aspart   0-5 Units Subcutaneous QHS  . insulin aspart  0-9 Units Subcutaneous TID WC  . ipratropium-albuterol  3 mL Nebulization BID  . levofloxacin  750 mg Oral Daily  . levothyroxine  150 mcg Oral QAC breakfast  . mupirocin ointment  1 application Nasal BID  . pantoprazole (PROTONIX) IV  40 mg Intravenous QHS  . potassium chloride  10 mEq Oral Daily  . ticagrelor  90 mg Oral BID  . vitamin B-12  2,000 mcg Oral Daily   Continuous Infusions:  PRN Meds:.albuterol, ALPRAZolam  Micro Results Recent Results (from the past 240 hour(s))  MRSA PCR Screening     Status: Abnormal   Collection Time: 02/08/17 11:49 PM  Result Value Ref Range Status   MRSA by PCR POSITIVE (A) NEGATIVE Final    Comment:        The GeneXpert MRSA Assay (FDA approved for NASAL specimens only), is one component of a comprehensive MRSA colonization surveillance program. It is not intended to diagnose MRSA infection nor to guide or monitor treatment for MRSA infections. RESULT CALLED TO, READ BACK BY AND VERIFIED WITH: C.EUBANKS,RN AT 0213 BY L.PITT 02/09/17   Body fluid culture (  includes gram stain)     Status: None   Collection Time: 02/09/17  2:20 AM  Result Value Ref Range Status   Specimen Description FLUID PLEURAL  Final   Special Requests NONE  Final   Gram Stain   Final    FEW WBC PRESENT, PREDOMINANTLY MONONUCLEAR NO ORGANISMS SEEN    Culture NO GROWTH 3 DAYS  Final   Report Status 02/12/2017 FINAL  Final    Radiology Reports Ct Chest Wo Contrast  Result Date: 02/09/2017 CLINICAL DATA:  Evaluate pleural effusion EXAM: CT CHEST WITHOUT CONTRAST TECHNIQUE: Multidetector CT imaging of the chest was performed following the standard protocol without IV contrast. COMPARISON:  Earlier same day FINDINGS: Cardiovascular: Marked cardiomegaly. No pericardial effusion. Left anterior chest wall pacer leads terminate within the right atrium and ventricle. Post coronary artery stent placement. Mild ectasia of the  ascending thoracic aorta measuring 30 mm in diameter. The main pulmonary artery is enlarged measuring 3.5 mm in diameter. Mediastinum/Nodes: Bulky mediastinal adenopathy with index right paratracheal lymph node measuring 3.5 cm in greatest short axis diameter (image 30, series 3) and index precarinal lymph node measuring 3 cm. Suspected right hilar adenopathy though this is suboptimally evaluated on this noncontrast examination. No axillary adenopathy. Lungs/Pleura: Interval reduction in persistent small left-sided pleural effusion post thoracentesis. No pneumothorax. A small amount of fluid is again seen tracking within the left major fissure. Mild apical predominant centrilobular emphysematous change. No pneumothorax. Improved aeration of the left lung with persistent perihilar ground-glass opacities and left basilar consolidative opacities with associated air bronchograms. Minimal right basilar heterogeneous opacities favored to represent atelectasis. Punctate (approximately 2 mm) calcified granuloma within the left upper lobe (image 16, series 4). No discrete pulmonary nodules or masses given limitation of the examination. Upper Abdomen: Limited noncontrast evaluation of the upper abdomen is unremarkable. Musculoskeletal: No acute or aggressive osseous abnormalities. IMPRESSION: 1. Interval reduction in persistent small left-sided effusion post thoracentesis. No pneumothorax. 2. Improved aeration of the left lung with persistent left perihilar and basilar opacities, nonspecific with differential considerations including asymmetric pulmonary edema versus infection (including atypical etiologies). 3. Bulky mediastinal adenopathy, the etiology of which is not depicted on this examination, specifically, there is no discrete pulmonary nodule or mass identified on this noncontrast examination. Further evaluation with contrast-enhanced CT of the chest, abdomen and pelvis versus outpatient PET-CT could be performed as  clinically indicated. Alternatively, bronchoscopic biopsy could be performed as indicated. 4. Cardiomegaly. 5. Aortic Atherosclerosis (ICD10-I70.0) and Emphysema (ICD10-J43.9). Electronically Signed   By: Sandi Mariscal M.D.   On: 02/09/2017 17:25   Dg Chest Port 1 View  Result Date: 02/12/2017 CLINICAL DATA:  Shortness of breath, 4 days duration.  Follow-up. EXAM: PORTABLE CHEST 1 VIEW COMPARISON:  02/10/2017 FINDINGS: Pacemaker/AID appears unchanged. Cardiomegaly persists. Persistent small to moderate size left effusion with left base atelectasis. Venous hypertension with early interstitial edema. IMPRESSION: Similar appearance to the study of 2 days ago. Persistent small to moderate size left effusion with left base atelectasis. Mild interstitial edema. Electronically Signed   By: Nelson Chimes M.D.   On: 02/12/2017 10:10   Dg Chest Port 1 View  Result Date: 02/10/2017 CLINICAL DATA:  Recent thoracentesis, cough, shortness of breath, CHF EXAM: PORTABLE CHEST 1 VIEW COMPARISON:  02/09/2017 FINDINGS: Patient is status post left thoracentesis. No pneumothorax. Left basilar partial collapse/ consolidation persist. Stable cardiomegaly with central vascular congestion. Right lung remains clear. Left subclavian pacer noted. Trachea is midline. IMPRESSION: Negative for pneumothorax following thoracentesis.  Persistent left lower lobe partial collapse / consolidation. Electronically Signed   By: Jerilynn Mages.  Shick M.D.   On: 02/10/2017 07:56   Dg Chest Port 1 View  Result Date: 02/09/2017 CLINICAL DATA:  Thoracentesis EXAM: PORTABLE CHEST 1 VIEW COMPARISON:  02/09/2017 FINDINGS: Substantial reduction of left pleural fluid volume. No pneumothorax. Mild residual left base opacity likely represents a combination of consolidation and residual pleural fluid. The right lung is clear. Moderate cardiomegaly persists. Grossly intact appearances of the transvenous cardiac leads. IMPRESSION: Substantial reduction of pleural fluid  volume after left thoracentesis. No pneumothorax. Electronically Signed   By: Andreas Newport M.D.   On: 02/09/2017 03:11   Dg Chest Port 1 View  Result Date: 02/09/2017 CLINICAL DATA:  61 y/o  M; shortness of breath. EXAM: PORTABLE CHEST 1 VIEW COMPARISON:  None. FINDINGS: Near complete opacification of the left lung probably representing a large effusion lung consolidation. Grossly clear right lung. The cardiac silhouette is largely obscured by opacification of the left hemithorax. Partially visualized 2 lead pacemaker. The patient's chin obscures the lung apices. No acute osseous abnormality is evident. IMPRESSION: Complete opacification of the left lung probably representing large effusion and lung consolidation. Electronically Signed   By: Kristine Garbe M.D.   On: 02/09/2017 01:22    Time Spent in minutes  35   Louellen Molder M.D on 02/12/2017 at 10:36 AM  Between 7am to 7pm - Pager - [phone]  After 7pm go to [email or web address] - password [facility]  [facility] -  Office  [phone]

## 2017-02-12 NOTE — Progress Notes (Addendum)
Advanced HF Team Consult Note   Primary Physician: Dr. Clementeen Graham Primary Cardiologist:  [place]  Reason for Consultation: CHF  HPI:    Stephen Cardenas is seen today for evaluation of HF at the request of Dr. Bertell Maria PMHx Chronic Systolic CHF (EF 10%), CAD s/p MI, AICD, COPD, OSA on CPAP, HTN, Hypothyroidism, Prior tobacco abuse, recent HCAP, and multiple recent hospitalizations.   He tells me that he has has 3 MIs. One many years ago. One in 2007 and the last one was in 2014. In 2014 had acute stent thrombosis with cardiogenic shock and was in ICU for 3 weeks. EF at baseline 15-20%. Did reasonable well for awhile.   Lives alone. About one month ago admitted to [facility] with low blood sugar. Discharged to Rehab. From Rehab was admitted to [facility] with CP and SOB. Told he had fluid on his lung and was transferred back to rehab. Discharged from [facility] on 6/25. Presented to [facility] ER on 6/27 with increasing SOB and weakness. CXR showed extremely large left pleural effusion. Transferred to [facility].    At baseline very limited but able to go to store and do ADLs slowly. Denies fevers or chills. Had episode of hemoptysis a few months ago but none recently. Endorses night sweats but attributes that to low blood sugar. Very good about taking HF meds and following weights.   Since admit has undergone left thoracentesis with marked reduction in effusion. Effusion was bloody but fluid analysis seems more transudative by Light's. Breathing better. Denies CP, orthopnea or PND. No edema  Has not smoked in 22 years  LDH  159/335 = 0.47 Protein 3.1/6.7 = 0.46   Echo today (viewed personally) EF 15-20% RV moderately dilated.   Review of Systems: [y] = yes, [ ]  = no   General: Weight gain [ ] ; Weight loss Blue.Reese ]; Anorexia [ y]; Fatigue [y]; Fever [ ] ; Chills [ ] ; Weakness Blue.Reese ]  Cardiac: Chest pain/pressure [ ] ; Resting SOB [ y]; Exertional SOB [ y]; Orthopnea [ ] ;  Pedal Edema [ ] ; Palpitations [ ] ; Syncope [ ] ; Presyncope [ ] ; Paroxysmal nocturnal dyspnea[ ]   Pulmonary: Cough [ ] ; Wheezing[ ] ; Hemoptysis[y ]; Sputum [ ] ; Snoring [ ]   GI: Vomiting[ ] ; Dysphagia[ ] ; Melena[ ] ; Hematochezia [ ] ; Heartburn[ ] ; Abdominal pain [ ] ; Constipation [ ] ; Diarrhea [ ] ; BRBPR [ ]   GU: Hematuria[ ] ; Dysuria [ ] ; Nocturia[ ]   Vascular: Pain in legs with walking Blue.Reese ]; Pain in feet with lying flat [ ] ; Non-healing sores [ ] ; Stroke [ ] ; TIA [ ] ; Slurred speech [ ] ;  Neuro: Headaches[ ] ; Vertigo[ ] ; Seizures[ ] ; Paresthesias[ ] ;Blurred vision [ ] ; Diplopia [ ] ; Vision changes [ ]   Ortho/Skin: Arthritis Blue.Reese ]; Joint pain [ y]; Muscle pain [ ] ; Joint swelling [ ] ; Back Pain [ ] ; Rash [ ]   Psych: Depression[ ] ; Anxiety[ ]   Heme: Bleeding problems [ ] ; Clotting disorders [ ] ; Anemia Blue.Reese ]  Endocrine: Diabetes Blue.Reese ]; Thyroid dysfunction[y ]  Home Medications Prior to Admission medications   Medication Sig Start Date End Date Taking? Authorizing Provider  ALPRAZolam Duanne Moron) 0.5 MG tablet Take 0.5 mg by mouth at bedtime as needed for anxiety.   Yes [provider]  arformoterol (BROVANA) 15 MCG/2ML NEBU Take 15 mcg by nebulization 2 (two) times daily.   Yes [provider]  aspirin EC 81 MG tablet Take 81 mg by mouth daily.  Yes [provider]  carvedilol (COREG) 25 MG tablet Take 25 mg by mouth 2 (two) times daily with a meal.   Yes [provider]  Cholecalciferol (VITAMIN D3) 2000 units TABS Take 2,000 Units by mouth daily.   Yes [provider]  docusate sodium (COLACE) 100 MG capsule Take 100 mg by mouth daily.   Yes [provider]  DULoxetine (CYMBALTA) 60 MG capsule Take 60 mg by mouth daily.   Yes [provider]  furosemide (LASIX) 40 MG tablet Take 40 mg by mouth daily.   Yes [provider]  ipratropium-albuterol (DUONEB) 0.5-2.5 (3) MG/3ML SOLN Take 3 mLs by nebulization every 6 (six) hours as  needed (for shortness of breath).   Yes [provider]  levothyroxine (SYNTHROID, LEVOTHROID) 150 MCG tablet Take 150 mcg by mouth daily before breakfast.   Yes [provider]  Multiple Vitamins-Minerals (MULTIVITAMIN PO) Take 1 tablet by mouth daily.   Yes [provider]  potassium chloride (K-DUR,KLOR-CON) 10 MEQ tablet Take 10 mEq by mouth daily.   Yes [provider]  ranitidine (ZANTAC) 300 MG tablet Take 300 mg by mouth at bedtime.   Yes [provider]  ticagrelor (BRILINTA) 90 MG TABS tablet Take 90 mg by mouth 2 (two) times daily.   Yes [provider]  vitamin B-12 (CYANOCOBALAMIN) 1000 MCG tablet Take 2,000 mcg by mouth daily.   Yes [provider]    Past Medical History: Past Medical History:  Diagnosis Date  . AICD (automatic cardioverter/defibrillator) present 2014  . CHF (congestive heart failure) ([place])   . COPD (chronic obstructive pulmonary disease) ([place])   . HCAP (healthcare-associated pneumonia)   . HTN (hypertension)   . Hypothyroidism   . MI (myocardial infarction) [facility])     Past Surgical History: Past Surgical History:  Procedure Laterality Date  . CARDIAC DEFIBRILLATOR PLACEMENT  2014  . FACIAL RECONSTRUCTION SURGERY Right 1984  . FEMUR FRACTURE SURGERY Right 1984  . WRIST FRACTURE SURGERY Right 2015    Family History: Family History  Problem Relation Age of Onset  . Diabetes Mother   . Heart disease Mother   . Stroke Mother   . Heart attack Mother   . Heart disease Father   . Heart attack Father   . Stroke Father   . COPD Sister   . Congestive Heart Failure Sister   . Hypertension Sister   . Diabetes Sister   . Hypertension Brother   . Diabetes Sister   . Hypertension Sister   . Diabetes Sister   . Hypertension Sister     Social History: Social History   Social History  . Marital status: Widowed    Spouse name: N/A  . Number of children: N/A  . Years of education: N/A     Social History Main Topics  . Smoking status: Former Smoker    Types: Cigarettes    Start date: 1968    Quit date: 1996  . Smokeless tobacco: Never Used  . Alcohol use No  . Drug use: No  . Sexual activity: Not Asked   Other Topics Concern  . None   Social History Narrative  . None    Allergies:  Allergies  Allergen Reactions  . Codeine Other (See Comments)    Increased work of breathing, diaphoretic    Objective:    Vital Signs:   Temp:  [97.8 F (36.6 C)-98 F (36.7 C)] 97.8 F (36.6 C) (06/30 5170) Pulse Rate:  [  97-107] 102 (06/30 1130) Resp:  [18-20] 20 (06/30 1130) BP: (117-137)/(90-103) 134/93 (06/30 1130) SpO2:  [95 %-98 %] 96 % (06/30 1130) Weight:  [77.7 kg (171 lb 3.2 oz)] 77.7 kg (171 lb 3.2 oz) (06/30 0613) Last BM Date: 02/12/17  Weight change: Filed Weights   02/10/17 1445 02/11/17 0530 02/12/17 8119  Weight: 77.8 kg (171 lb 8 oz) 78.4 kg (172 lb 14.4 oz) 77.7 kg (171 lb 3.2 oz)    Intake/Output:   Intake/Output Summary (Last 24 hours) at 02/12/17 1539 Last data filed at 02/12/17 1458  Gross per 24 hour  Intake             1200 ml  Output             2525 ml  Net            -1325 ml      Physical Exam    General:  Lying in bed. Weak appearing. NAD HEENT: normal. Poor dentition. Large beard Neck: supple. JVP hard to see. Looks realtively flat. Carotids 2+ bilat; no bruits. No lymphadenopathy or thyromegaly appreciated. Cor: PMI laterally displaced. Regular rate & rhythm. +s3 Lungs: decreased at bases L>R  Mild basilar crackles. No wheeze Abdomen: soft, nontender, nondistended. No hepatosplenomegaly. No bruits or masses. Good bowel sounds. Extremities: no cyanosis, clubbing, rash, edema cool  Neuro: alert & orientedx3, cranial nerves grossly intact. moves all 4 extremities w/o difficulty. Affect pleasant   Telemetry   Sinus rhythm 90-100  EKG    Sinus tach 102. Inferolaterals Qs  1AVB 237ms. No ST-T wave abnormalities.  Personally reviewed   Labs   Basic Metabolic Panel:  Recent Labs Lab 02/09/17 0245 02/10/17 0320 02/11/17 0340 02/12/17 0456  NA 135 135 134* 132*  K 3.6 4.0 4.5 4.3  CL 100* 102 101 100*  CO2 22 26 25 25   GLUCOSE 197* 101* 92 112*  BUN 26* 21* 22* 27*  CREATININE 1.23 0.86 0.99 1.00  CALCIUM 8.9 8.4* 8.5* 8.7*  MG 1.6*  --  2.0  --   PHOS 4.4  --   --   --     Liver Function Tests:  Recent Labs Lab 02/09/17 0245  PROT 6.7  ALBUMIN 2.8*   No results for input(s): LIPASE, AMYLASE in the last 168 hours. No results for input(s): AMMONIA in the last 168 hours.  CBC:  Recent Labs Lab 02/09/17 0245 02/09/17 2227 02/10/17 0320 02/11/17 0340  WBC 10.1  --  15.4* 11.9*  HGB 9.4*  --  8.7* 9.0*  HCT 30.2* 28.9* 28.4* 30.1*  MCV 88.0  --  86.3 89.1  PLT 234  --  228 250    Cardiac Enzymes:  Recent Labs Lab 02/12/17 0012 02/12/17 0456 02/12/17 1221  TROPONINI 0.10* 0.10* 0.09*    BNP: BNP (last 3 results)  Recent Labs  02/12/17 1221  BNP >4,500.0*    ProBNP (last 3 results) No results for input(s): PROBNP in the last 8760 hours.   CBG:  Recent Labs Lab 02/11/17 1605 02/11/17 2016 02/12/17 0555 02/12/17 0749 02/12/17 1132  GLUCAP 164* 102* 124* 103* 126*    Coagulation Studies: No results for input(s): LABPROT, INR in the last 72 hours.   Imaging   Dg Chest Port 1 View  Result Date: 02/12/2017 CLINICAL DATA:  Shortness of breath, 4 days duration.  Follow-up. EXAM: PORTABLE CHEST 1 VIEW COMPARISON:  02/10/2017 FINDINGS: Pacemaker/AID appears unchanged. Cardiomegaly persists. Persistent small to moderate size left effusion  with left base atelectasis. Venous hypertension with early interstitial edema. IMPRESSION: Similar appearance to the study of 2 days ago. Persistent small to moderate size left effusion with left base atelectasis. Mild interstitial edema. Electronically Signed   By: Nelson Chimes M.D.   On: 02/12/2017 10:10       Medications:     Current Medications: . arformoterol  15 mcg Nebulization BID  . aspirin EC  81 mg Oral Daily  . budesonide (PULMICORT) nebulizer solution  0.5 mg Nebulization BID  . Chlorhexidine Gluconate Cloth  6 each Topical Q0600  . cholecalciferol  2,000 Units Oral Daily  . docusate sodium  100 mg Oral Daily  . DULoxetine  60 mg Oral Daily  . ferrous sulfate  325 mg Oral BID WC  . furosemide  80 mg Intravenous BID  . insulin aspart  0-5 Units Subcutaneous QHS  . insulin aspart  0-9 Units Subcutaneous TID WC  . ipratropium-albuterol  3 mL Nebulization BID  . levofloxacin  750 mg Oral Daily  . levothyroxine  150 mcg Oral QAC breakfast  . mupirocin ointment  1 application Nasal BID  . pantoprazole (PROTONIX) IV  40 mg Intravenous QHS  . potassium chloride  10 mEq Oral Daily  . ticagrelor  90 mg Oral BID  . vitamin B-12  2,000 mcg Oral Daily     Infusions:     Patient Profile    60yoM with h/o chronic Systolic CHF (EF 50%), CAD s/p MI, ICD, COPD, OSA on CPAP, HTN, Hypothyroidism. Multiple recent hospitalizations admitted with recurrent respiratory failure and large left pleural effusion. Now s/p thoracentesis (transudative)   Assessment/Plan   1. Acute on chronic hypoxic respiratory failure with large left pleural effusion - breathing much improved after thoracentesis - by report tap was bloody but effusion is transudative by Light's criteria. Cytology only with mesothelial cells commonly seen in HF. I spoke with Dr. Titus Mould and we will plan repeat CT to better evaluate left lung for malignancy now that effusion is improved. My suspicion is that this is most likely related to HF - if recurs may need Pleurex tube  2. Large left pleural effusion - s/p thoracentesis -> transudative by Light's critreia - see above  3. Acute on chronic systolic HF due to iCM - EF 15-20% by echo 6/30. Moderate RV dysfunction. Viewed personally - Chronic NYHA IIIb-IV. Suspect  low output - Volume status looks ok. Can switch to po diuretics in am. - Adjust HF meds as tolerated. Add spiro and digoxin for now. Entresto or ARB in am. Hold b-blocker.  - Will need RHC this week to further assess -Given comorbidities I am not sure he will qualify for advanced therapies but may benefit from a trial of inotropes to help him regain stamina so we can get him to advanced therapies  4. CAD s/p previous MI - Stable. No s/s ischemia. Continue ASA and Brilinta. Start statin.  5. COPD - stable. Management per primary  6. Mediastinal lymphadenopathy on chest CT - ? Related to HF or recent HCAP. Will get f/u CT today  7. Failure to thrive/severe deconditioning - PT to see   Length of Stay: 4  Glori Bickers, MD  02/12/2017, 3:39 PM  Advanced Heart Failure Team Pager [phone] (M-F; 7a - 4p)  Please contact [facility] for night-coverage after hours (4p -7a ) and weekends on [email or web address]

## 2017-02-12 NOTE — Progress Notes (Signed)
  Echocardiogram 2D Echocardiogram has been performed.  Stephen Cardenas 02/12/2017, 11:16 AM

## 2017-02-13 LAB — GLUCOSE, CAPILLARY
GLUCOSE-CAPILLARY: 111 mg/dL — AB (ref 65–99)
GLUCOSE-CAPILLARY: 136 mg/dL — AB (ref 65–99)
GLUCOSE-CAPILLARY: 116 mg/dL — AB (ref 65–99)
Glucose-Capillary: 106 mg/dL — ABNORMAL HIGH (ref 65–99)

## 2017-02-13 LAB — BASIC METABOLIC PANEL
Anion gap: 8 (ref 5–15)
BUN: 29 mg/dL — AB (ref 6–20)
CALCIUM: 8.3 mg/dL — AB (ref 8.9–10.3)
CHLORIDE: 97 mmol/L — AB (ref 101–111)
CO2: 26 mmol/L (ref 22–32)
CREATININE: 0.97 mg/dL (ref 0.61–1.24)
GFR calc non Af Amer: >60 mL/min (ref >60)
GLUCOSE: 137 mg/dL — AB (ref 65–99)
Potassium: 3.5 mmol/L (ref 3.5–5.1)
Sodium: 131 mmol/L — ABNORMAL LOW (ref 135–145)

## 2017-02-13 MED ORDER — POTASSIUM CHLORIDE CRYS ER 20 MEQ PO TBCR
40.0000 meq | EXTENDED_RELEASE_TABLET | Freq: Once | ORAL | Status: AC
Start: 2017-02-13 — End: 2017-02-13
  Administered 2017-02-13: 40 meq via ORAL
  Filled 2017-02-13: qty 2

## 2017-02-13 MED ORDER — ASPIRIN EC 81 MG PO TBEC
81.0000 mg | DELAYED_RELEASE_TABLET | Freq: Every day | ORAL | Status: DC
  Administered 2017-02-15 – 2017-02-17 (×3): 81 mg via ORAL
  Filled 2017-02-13 (×3): qty 1

## 2017-02-13 MED ORDER — SODIUM CHLORIDE 0.9 % IV SOLN: 250.0000 mL | INTRAVENOUS | Status: DC | PRN

## 2017-02-13 MED ORDER — SODIUM CHLORIDE 0.9% FLUSH
3.0000 mL | Freq: Two times a day (BID) | INTRAVENOUS | Status: DC
  Administered 2017-02-13: 3 mL via INTRAVENOUS

## 2017-02-13 MED ORDER — ASPIRIN 81 MG PO CHEW
81.0000 mg | CHEWABLE_TABLET | ORAL | Status: AC
  Administered 2017-02-14: 81 mg via ORAL
  Filled 2017-02-13: qty 1

## 2017-02-13 MED ORDER — SODIUM CHLORIDE 0.9 % IV SOLN
INTRAVENOUS | Status: DC
  Administered 2017-02-14: 06:00:00 via INTRAVENOUS

## 2017-02-13 MED ORDER — LOSARTAN POTASSIUM 25 MG PO TABS
25.0000 mg | ORAL_TABLET | Freq: Every day | ORAL | Status: DC
  Administered 2017-02-13 – 2017-02-19 (×7): 25 mg via ORAL
  Filled 2017-02-13 (×7): qty 1

## 2017-02-13 MED ORDER — POTASSIUM CHLORIDE CRYS ER 20 MEQ PO TBCR
40.0000 meq | EXTENDED_RELEASE_TABLET | Freq: Once | ORAL | Status: AC
  Administered 2017-02-13: 40 meq via ORAL
  Filled 2017-02-13: qty 2

## 2017-02-13 MED ORDER — SODIUM CHLORIDE 0.9% FLUSH
3.0000 mL | INTRAVENOUS | Status: DC | PRN
Start: 2017-02-13 — End: 2017-02-14

## 2017-02-13 NOTE — Progress Notes (Addendum)
Advanced Heart Failure Rounding Note  PCP:  Primary Cardiologist:   Subjective:     Diuresed well overnight. Breathing better. CT shows residual moderate left effusion. (viewed personally)   Objective:   Weight Range: 74.9 kg (165 lb 3.2 oz) Body mass index is 23.7 kg/m.   Vital Signs:   Temp:  [97.5 F (36.4 C)-98.4 F (36.9 C)] 98.4 F (36.9 C) (07/01 0432) Pulse Rate:  [75-92] 75 (07/01 0432) Resp:  [18] 18 (07/01 0432) BP: (113-124)/(80) 113/80 (07/01 0432) SpO2:  [97 %-100 %] 100 % (07/01 0757) Weight:  [74.9 kg (165 lb 3.2 oz)] 74.9 kg (165 lb 3.2 oz) (07/01 0432) Last BM Date: 02/11/17  Weight change: Filed Weights   02/11/17 0530 02/12/17 0613 02/13/17 0432  Weight: 78.4 kg (172 lb 14.4 oz) 77.7 kg (171 lb 3.2 oz) 74.9 kg (165 lb 3.2 oz)    Intake/Output:   Intake/Output Summary (Last 24 hours) at 02/13/17 1135 Last data filed at 02/13/17 1103  Gross per 24 hour  Intake              240 ml  Output             3625 ml  Net            -3385 ml      Physical Exam    General:  Sitting on side of bed, NAD HEENT: Normal Neck: Supple. JVP 8-9 . Carotids 2+ bilat; no bruits. No lymphadenopathy or thyromegaly appreciated. Cor: PMI laterally displaced. Regular rate & rhythm. +s3 Lungs: Clear. Decreased on left Abdomen: Soft, nontender, nondistended. No hepatosplenomegaly. No bruits or masses. Good bowel sounds. Extremities: No cyanosis, clubbing, rash, edema Neuro: Alert & orientedx3, cranial nerves grossly intact. moves all 4 extremities w/o difficulty. Affect pleasant   Telemetry   NSR 70s  Personally reviewed    EKG    NA  Labs    CBC  Recent Labs  02/11/17 0340  WBC 11.9*  HGB 9.0*  HCT 30.1*  MCV 89.1  PLT 494   Basic Metabolic Panel  Recent Labs  02/11/17 0340 02/12/17 0456 02/13/17 0417  NA 134* 132* 131*  K 4.5 4.3 3.5  CL 101 100* 97*  CO2 25 25 26   GLUCOSE 92 112* 137*  BUN 22* 27* 29*  CREATININE 0.99 1.00  0.97  CALCIUM 8.5* 8.7* 8.3*  MG 2.0  --   --    Liver Function Tests No results for input(s): AST, ALT, ALKPHOS, BILITOT, PROT, ALBUMIN in the last 72 hours. No results for input(s): LIPASE, AMYLASE in the last 72 hours. Cardiac Enzymes  Recent Labs  02/12/17 0012 02/12/17 0456 02/12/17 1221  TROPONINI 0.10* 0.10* 0.09*    BNP: BNP (last 3 results)  Recent Labs  02/12/17 1221  BNP >4,500.0*    ProBNP (last 3 results) No results for input(s): PROBNP in the last 8760 hours.   D-Dimer No results for input(s): DDIMER in the last 72 hours. Hemoglobin A1C No results for input(s): HGBA1C in the last 72 hours. Fasting Lipid Panel  Recent Labs  02/12/17 0456  CHOL 113  HDL 24*  LDLCALC 68  TRIG 106  CHOLHDL 4.7   Thyroid Function Tests No results for input(s): TSH, T4TOTAL, T3FREE, THYROIDAB in the last 72 hours.  Invalid input(s): FREET3  Other results:   Imaging    Ct Chest Wo Contrast  Result Date: 02/12/2017 CLINICAL DATA:  Pleural effusion EXAM: CT CHEST WITHOUT CONTRAST TECHNIQUE:  Multidetector CT imaging of the chest was performed following the standard protocol without IV contrast. COMPARISON:  Radiograph 02/12/2017, CT 02/09/2017 FINDINGS: Cardiovascular: Limited evaluation without intravenous contrast. Aortic atherosclerosis. No aneurysmal dilatation. Ectasia of the ascending segment. Enlarged pulmonary arteries. Intracardiac pacing leads. Mild cardiomegaly. No large pericardial effusion. Coronary artery calcification. Mediastinum/Nodes: Midline trachea. No thyroid mass. Bulky mediastinal adenopathy re- demonstrated, right paratracheal node or mass measures 3.5 cm compared with 3.5 cm previously. Esophagus grossly unremarkable. Lungs/Pleura: Emphysema at the upper lobes. No focal consolidation or significant effusion on the right. Decreased pleural effusion in the left upper chest with some loculation at the apex and fluid along the fissure. Moderate  subpulmonic pleural effusion remains. Consolidation within the left lower lobe and lingula may reflect atelectasis or pneumonia Upper Abdomen: No acute abnormality. Partially visualized probable 1.9 cm cyst in the mid right kidney. Musculoskeletal: Moderate severe compression fracture of L1, uncertain chronicity, with retropulsion of 7 mm on sagittal views. Estimated 50% loss of vertebral body height anteriorly. No other significant osseous abnormality. IMPRESSION: 1. Slight decreased pleural effusion at the left upper lobe with fluid tracking along the left pulmonary fissure. Moderate residual left pleural effusion at the lung base, largely subpulmonic. 2. No change in bulky mediastinal adenopathy, which may be secondary to infection, inflammation, or neoplasm. Correlation with PET-CT or possible tissue sampling previously recommended. 3. Patchy consolidation in the lingula and left lower lobe may reflect atelectasis or pneumonia 4. Moderate severe compression fracture of L1, uncertain chronicity, with 7 mm of retropulsion. Aortic Atherosclerosis (ICD10-I70.0) and Emphysema (ICD10-J43.9). Electronically Signed   By: Donavan Foil M.D.   On: 02/12/2017 20:15      Medications:     Scheduled Medications: . arformoterol  15 mcg Nebulization BID  . aspirin EC  81 mg Oral Daily  . budesonide (PULMICORT) nebulizer solution  0.5 mg Nebulization BID  . cholecalciferol  2,000 Units Oral Daily  . digoxin  0.125 mg Oral Daily  . docusate sodium  100 mg Oral Daily  . DULoxetine  60 mg Oral Daily  . ferrous sulfate  325 mg Oral BID WC  . furosemide  80 mg Intravenous BID  . insulin aspart  0-5 Units Subcutaneous QHS  . insulin aspart  0-9 Units Subcutaneous TID WC  . ipratropium-albuterol  3 mL Nebulization BID  . levofloxacin  750 mg Oral Daily  . levothyroxine  150 mcg Oral QAC breakfast  . mupirocin ointment  1 application Nasal BID  . pantoprazole (PROTONIX) IV  40 mg Intravenous QHS  . rosuvastatin   20 mg Oral q1800  . spironolactone  12.5 mg Oral Daily  . ticagrelor  90 mg Oral BID  . vitamin B-12  2,000 mcg Oral Daily     Infusions:   PRN Medications:  albuterol, ALPRAZolam    Patient Profile   60yoM with h/o chronic Systolic CHF (EF 27%), CAD s/p MI, ICD, COPD, OSA on CPAP, HTN, Hypothyroidism. Multiple recent hospitalizations admitted with recurrent respiratory failure and large left pleural effusion. Now s/p thoracentesis (transudative)   Assessment/Plan   1. Acute on chronic hypoxic respiratory failure with large left pleural effusion - breathing much improved after thoracentesis - by report tap was bloody but effusion is transudative by Light's criteria. Cytology only with mesothelial cells commonly seen in HF. I spoke with Dr. Titus Mould and we will plan repeat CT to better evaluate left lung for malignancy now that effusion is improved. My suspicion is that this is most likely  related to HF - repeat CT 6/30 showed moderate residual effusion. Will continue to follow. Hopefully will improve with management of volume status - If recurs may need Pleurex - Desats with exertion. Will likely need home O2. - Continue pulmonary toilet.  - Being treated for levaquin x 8 days for possible PNA per primary  2. Large left pleural effusion - s/p thoracentesis -> transudative by Light's critreia - see above  3. Acute on chronic systolic HF due to iCM - EF 15-20% by echo 6/30. Moderate RV dysfunction. Viewed personally - Chronic NYHA IIIb-IV. Suspect low output - Diuresed well overnight. Weight down 6 pounds. Renal function stable - Will give one more dose of IV lasix ton - Spiro and digoxin started yesterday. Add losartan 25 today.  - Plan R/L cath tomorrow  -Given comorbidities I am not sure he will qualify for advanced therapies but may benefit from a trial of inotropes,if needed, to help him regain stamina so we can get him to advanced therapies  4. CAD s/p previous  MI - Stable. No s/s ischemia. Continue ASA and Brilinta. Crestor started yesterday - Plan R/L cath tomorrow as part of work-up for advanced therapies   5. COPD - stable. Management per primary  6. Mediastinal lymphadenopathy on chest CT - ? Related to HF or other underlying process. LDH ok. Consider outpatient PET  7. Failure to thrive/severe deconditioning - PT recommending SNF   8. Hypokalemia - Will supp  Length of Stay: 5   Stephen Straka, MD  02/13/2017, 11:35 AM  Advanced Heart Failure Team Pager [phone] (M-F; 7a - 4p)  Please contact [facility] for night-coverage after hours (4p -7a ) and weekends on [email or web address]

## 2017-02-13 NOTE — Progress Notes (Signed)
PROGRESS NOTE                                                                                                                                                                                                             Patient Demographics:    Stephen Cardenas, is a 61 y.o. male, DOB - [birth year], BWG:[id number]  Admit date - 02/08/2017   Admitting Physician Collene Gobble, MD  Outpatient Primary MD for the patient is System, Pcp Not In  LOS - 5  Outpatient Specialists:Cardiologist in [place]  No chief complaint on file.      Brief Narrative   61 year old male with chronic systolic CHF (EF of 17%), CAD with history of MI, AICD, COPD, OSA on CPAP, hypertension, hypothyroidism, recently treated for HCT AP and multiple recent hospitalization who was discharged from [facility] 3 weeks back and then subsequently admitted to [facility], discharged to rehabilitation from there to rehabilitation with failure to thrive presented to the ED with increasing shortness of breath. EMS was called and patient taken to [facility] and placed on BiPAP. Chest x-ray showed large lateral left pleural effusion. He received IV Zosyn and Levaquin in the hospital. CT chest was unable to be obtained since patient unable to lie down flat. He was then referred to critical care at [facility] and transferred here for further management. Patient admitted for sepsis possibly secondary to left parapneumonic effusion.   Subjective:   Patient reports being able to sleep better last night. Has diuresed quite well with IV Lasix. Denies any further chest pain.   Assessment  & Plan :   Acute respiratory failure with hypoxia (HCC) Secondary to acute on chronic CHF and left pleural effusion. s/p left thoracentesis on 6/27 with improvement. Empirically treated for HCAP and ? Or pneumonic effusion. Blood cultures sent from [facility] not been available yet. Cultures from thoracentesis negative for growth. Continue O2 via nasal cannula. CPAP at night.  Repeat chest CT shows moderate residual left pleural effusion at the lung base. Again shows large mediastinal adenopathy.  Acute on chronic systolic CHF ([place]) Started on IV Lasix with good diuresis. (Weight down 6 pounds since yesterday), transitioned to oral Lasix 80 mg twice a day. 2-D echo shows worsening EF of 15-20% with moderate to severe MR, dilated LA and right ventricle.  Severe PAH (65 mmHg) -Heart failure consult appreciated. Added digoxin and Aldactone. Continue aspirin and statin. Holding beta blocker given acute decompensation. Plan on starting Entresto or ARB. RHC sometime this week.    Severe sepsis ([place]) Resolved. secondary to pneumonia with ?parapneumonic effusion. Cultures (both blood and pleural fluid) negative. Antibiotics narrowed to Levaquin and treat for total 8 days.   Chest pain with elevated troponin Suspect demand ischemia with acute CHF. Now resolved.   COPD Continue Pulmicort and Brovana. When necessary albuterol nebs.  Acute kidney injury Possibly due to sepsis. Resolved.  Coronary artery disease History of PCI. Continue aspirin and Brilinta   Iron deficiency anemia Added supplement. On B12 supplement.   Mediastinal adenopathy Seen on CT. Will need PET-CT as outpatient versus lung biopsy.  Hypokalemia Replenish   Code Status : Full code  Family Communication  : None at bedside  Disposition Plan  : Possibly needs SNF  Barriers For Discharge : Active symptoms  Consults  :   PC CM Heart failure  Procedures  :  CT chest Left thoracentesis 2-D echo  DVT Prophylaxis  :  Lovenox -  Lab Results  Component Value Date   PLT 250 02/11/2017    Antibiotics  :    Anti-infectives    Start     Dose/Rate Route Frequency Ordered Stop   02/12/17 1030  levofloxacin (LEVAQUIN) tablet 750 mg     750 mg Oral Daily  02/12/17 1021     02/10/17 1500  vancomycin (VANCOCIN) IVPB 750 mg/150 ml premix  Status:  Discontinued     750 mg 150 mL/hr over 60 Minutes Intravenous Every 8 hours 02/10/17 0748 02/12/17 0935   02/09/17 2000  levofloxacin (LEVAQUIN) IVPB 750 mg  Status:  Discontinued     750 mg 100 mL/hr over 90 Minutes Intravenous Every 24 hours 02/09/17 0132 02/09/17 1036   02/09/17 1800  vancomycin (VANCOCIN) IVPB 1000 mg/200 mL premix  Status:  Discontinued     1,000 mg 200 mL/hr over 60 Minutes Intravenous Every 12 hours 02/09/17 0132 02/10/17 0748   02/09/17 0230  vancomycin (VANCOCIN) 1,500 mg in sodium chloride 0.9 % 500 mL IVPB     1,500 mg 250 mL/hr over 120 Minutes Intravenous  Once 02/09/17 0150 02/09/17 0431   02/09/17 0200  meropenem (MERREM) 1 g in sodium chloride 0.9 % 100 mL IVPB  Status:  Discontinued     1 g 200 mL/hr over 30 Minutes Intravenous Every 8 hours 02/09/17 0132 02/12/17 0935        Objective:   Vitals:   02/12/17 2024 02/12/17 2026 02/13/17 0432 02/13/17 0757  BP: 124/80  113/80   Pulse: 92  75   Resp: 18  18   Temp: 97.5 F (36.4 C)  98.4 F (36.9 C)   TempSrc: Oral  Oral   SpO2: 98% 97% 99% 100%  Weight:   74.9 kg (165 lb 3.2 oz)   Height:        Wt Readings from Last 3 Encounters:  02/13/17 74.9 kg (165 lb 3.2 oz)     Intake/Output Summary (Last 24 hours) at 02/13/17 1212 Last data filed at 02/13/17 1103  Gross per 24 hour  Intake              240 ml  Output             3625 ml  Net            -3385 ml  Physical Exam Gen.: Not in distress, fatigue  HEENT: Moist mucosa, supple neck Chest: Improved breath sounds over lung bases CVS: Normal S1 and S2, 3/6 systolic murmur, GI: Soft, nondistended, nontender Musculoskeletal: Warm,No edema      Data Review:    CBC  Recent Labs Lab 02/09/17 0245 02/09/17 2227 02/10/17 0320 02/11/17 0340  WBC 10.1  --  15.4* 11.9*  HGB 9.4*  --  8.7* 9.0*  HCT 30.2* 28.9* 28.4* 30.1*  PLT 234  --   228 250  MCV 88.0  --  86.3 89.1  MCH 27.4  --  26.4 26.6  MCHC 31.1  --  30.6 29.9*  RDW 17.8*  --  17.6* 18.3*    Chemistries   Recent Labs Lab 02/09/17 0245 02/10/17 0320 02/11/17 0340 02/12/17 0456 02/13/17 0417  NA 135 135 134* 132* 131*  K 3.6 4.0 4.5 4.3 3.5  CL 100* 102 101 100* 97*  CO2 22 26 25 25 26   GLUCOSE 197* 101* 92 112* 137*  BUN 26* 21* 22* 27* 29*  CREATININE 1.23 0.86 0.99 1.00 0.97  CALCIUM 8.9 8.4* 8.5* 8.7* 8.3*  MG 1.6*  --  2.0  --   --    ------------------------------------------------------------------------------------------------------------------  Recent Labs  02/12/17 0456  CHOL 113  HDL 24*  LDLCALC 68  TRIG 106  CHOLHDL 4.7    No results found for: HGBA1C ------------------------------------------------------------------------------------------------------------------ No results for input(s): TSH, T4TOTAL, T3FREE, THYROIDAB in the last 72 hours.  Invalid input(s): FREET3 ------------------------------------------------------------------------------------------------------------------  Recent Labs  02/11/17 1553  TIBC 417  IRON 43*    Coagulation profile  Recent Labs Lab 02/09/17 1207  INR 1.51    No results for input(s): DDIMER in the last 72 hours.  Cardiac Enzymes  Recent Labs Lab 02/12/17 0012 02/12/17 0456 02/12/17 1221  TROPONINI 0.10* 0.10* 0.09*   ------------------------------------------------------------------------------------------------------------------    Component Value Date/Time   BNP >4,500.0 (H) 02/12/2017 1221    Inpatient Medications  Scheduled Meds: . arformoterol  15 mcg Nebulization BID  . aspirin EC  81 mg Oral Daily  . budesonide (PULMICORT) nebulizer solution  0.5 mg Nebulization BID  . cholecalciferol  2,000 Units Oral Daily  . digoxin  0.125 mg Oral Daily  . docusate sodium  100 mg Oral Daily  . DULoxetine  60 mg Oral Daily  . ferrous sulfate  325 mg Oral BID WC  .  furosemide  80 mg Intravenous BID  . insulin aspart  0-5 Units Subcutaneous QHS  . insulin aspart  0-9 Units Subcutaneous TID WC  . ipratropium-albuterol  3 mL Nebulization BID  . levofloxacin  750 mg Oral Daily  . levothyroxine  150 mcg Oral QAC breakfast  . mupirocin ointment  1 application Nasal BID  . pantoprazole (PROTONIX) IV  40 mg Intravenous QHS  . rosuvastatin  20 mg Oral q1800  . spironolactone  12.5 mg Oral Daily  . ticagrelor  90 mg Oral BID  . vitamin B-12  2,000 mcg Oral Daily   Continuous Infusions:  PRN Meds:.albuterol, ALPRAZolam  Micro Results Recent Results (from the past 240 hour(s))  MRSA PCR Screening     Status: Abnormal   Collection Time: 02/08/17 11:49 PM  Result Value Ref Range Status   MRSA by PCR POSITIVE (A) NEGATIVE Final    Comment:        The GeneXpert MRSA Assay (FDA approved for NASAL specimens only), is one component of a comprehensive MRSA colonization surveillance program. It is not  intended to diagnose MRSA infection nor to guide or monitor treatment for MRSA infections. RESULT CALLED TO, READ BACK BY AND VERIFIED WITH: C.EUBANKS,RN AT 0213 BY L.PITT 02/09/17   Body fluid culture (includes gram stain)     Status: None   Collection Time: 02/09/17  2:20 AM  Result Value Ref Range Status   Specimen Description FLUID PLEURAL  Final   Special Requests NONE  Final   Gram Stain   Final    FEW WBC PRESENT, PREDOMINANTLY MONONUCLEAR NO ORGANISMS SEEN    Culture NO GROWTH 3 DAYS  Final   Report Status 02/12/2017 FINAL  Final    Radiology Reports Ct Chest Wo Contrast  Result Date: 02/12/2017 CLINICAL DATA:  Pleural effusion EXAM: CT CHEST WITHOUT CONTRAST TECHNIQUE: Multidetector CT imaging of the chest was performed following the standard protocol without IV contrast. COMPARISON:  Radiograph 02/12/2017, CT 02/09/2017 FINDINGS: Cardiovascular: Limited evaluation without intravenous contrast. Aortic atherosclerosis. No aneurysmal  dilatation. Ectasia of the ascending segment. Enlarged pulmonary arteries. Intracardiac pacing leads. Mild cardiomegaly. No large pericardial effusion. Coronary artery calcification. Mediastinum/Nodes: Midline trachea. No thyroid mass. Bulky mediastinal adenopathy re- demonstrated, right paratracheal node or mass measures 3.5 cm compared with 3.5 cm previously. Esophagus grossly unremarkable. Lungs/Pleura: Emphysema at the upper lobes. No focal consolidation or significant effusion on the right. Decreased pleural effusion in the left upper chest with some loculation at the apex and fluid along the fissure. Moderate subpulmonic pleural effusion remains. Consolidation within the left lower lobe and lingula may reflect atelectasis or pneumonia Upper Abdomen: No acute abnormality. Partially visualized probable 1.9 cm cyst in the mid right kidney. Musculoskeletal: Moderate severe compression fracture of L1, uncertain chronicity, with retropulsion of 7 mm on sagittal views. Estimated 50% loss of vertebral body height anteriorly. No other significant osseous abnormality. IMPRESSION: 1. Slight decreased pleural effusion at the left upper lobe with fluid tracking along the left pulmonary fissure. Moderate residual left pleural effusion at the lung base, largely subpulmonic. 2. No change in bulky mediastinal adenopathy, which may be secondary to infection, inflammation, or neoplasm. Correlation with PET-CT or possible tissue sampling previously recommended. 3. Patchy consolidation in the lingula and left lower lobe may reflect atelectasis or pneumonia 4. Moderate severe compression fracture of L1, uncertain chronicity, with 7 mm of retropulsion. Aortic Atherosclerosis (ICD10-I70.0) and Emphysema (ICD10-J43.9). Electronically Signed   By: Donavan Foil M.D.   On: 02/12/2017 20:15   Ct Chest Wo Contrast  Result Date: 02/09/2017 CLINICAL DATA:  Evaluate pleural effusion EXAM: CT CHEST WITHOUT CONTRAST TECHNIQUE:  Multidetector CT imaging of the chest was performed following the standard protocol without IV contrast. COMPARISON:  Earlier same day FINDINGS: Cardiovascular: Marked cardiomegaly. No pericardial effusion. Left anterior chest wall pacer leads terminate within the right atrium and ventricle. Post coronary artery stent placement. Mild ectasia of the ascending thoracic aorta measuring 30 mm in diameter. The main pulmonary artery is enlarged measuring 3.5 mm in diameter. Mediastinum/Nodes: Bulky mediastinal adenopathy with index right paratracheal lymph node measuring 3.5 cm in greatest short axis diameter (image 30, series 3) and index precarinal lymph node measuring 3 cm. Suspected right hilar adenopathy though this is suboptimally evaluated on this noncontrast examination. No axillary adenopathy. Lungs/Pleura: Interval reduction in persistent small left-sided pleural effusion post thoracentesis. No pneumothorax. A small amount of fluid is again seen tracking within the left major fissure. Mild apical predominant centrilobular emphysematous change. No pneumothorax. Improved aeration of the left lung with persistent perihilar ground-glass opacities and left  basilar consolidative opacities with associated air bronchograms. Minimal right basilar heterogeneous opacities favored to represent atelectasis. Punctate (approximately 2 mm) calcified granuloma within the left upper lobe (image 16, series 4). No discrete pulmonary nodules or masses given limitation of the examination. Upper Abdomen: Limited noncontrast evaluation of the upper abdomen is unremarkable. Musculoskeletal: No acute or aggressive osseous abnormalities. IMPRESSION: 1. Interval reduction in persistent small left-sided effusion post thoracentesis. No pneumothorax. 2. Improved aeration of the left lung with persistent left perihilar and basilar opacities, nonspecific with differential considerations including asymmetric pulmonary edema versus infection  (including atypical etiologies). 3. Bulky mediastinal adenopathy, the etiology of which is not depicted on this examination, specifically, there is no discrete pulmonary nodule or mass identified on this noncontrast examination. Further evaluation with contrast-enhanced CT of the chest, abdomen and pelvis versus outpatient PET-CT could be performed as clinically indicated. Alternatively, bronchoscopic biopsy could be performed as indicated. 4. Cardiomegaly. 5. Aortic Atherosclerosis (ICD10-I70.0) and Emphysema (ICD10-J43.9). Electronically Signed   By: Sandi Mariscal M.D.   On: 02/09/2017 17:25   Dg Chest Port 1 View  Result Date: 02/12/2017 CLINICAL DATA:  Shortness of breath, 4 days duration.  Follow-up. EXAM: PORTABLE CHEST 1 VIEW COMPARISON:  02/10/2017 FINDINGS: Pacemaker/AID appears unchanged. Cardiomegaly persists. Persistent small to moderate size left effusion with left base atelectasis. Venous hypertension with early interstitial edema. IMPRESSION: Similar appearance to the study of 2 days ago. Persistent small to moderate size left effusion with left base atelectasis. Mild interstitial edema. Electronically Signed   By: Nelson Chimes M.D.   On: 02/12/2017 10:10   Dg Chest Port 1 View  Result Date: 02/10/2017 CLINICAL DATA:  Recent thoracentesis, cough, shortness of breath, CHF EXAM: PORTABLE CHEST 1 VIEW COMPARISON:  02/09/2017 FINDINGS: Patient is status post left thoracentesis. No pneumothorax. Left basilar partial collapse/ consolidation persist. Stable cardiomegaly with central vascular congestion. Right lung remains clear. Left subclavian pacer noted. Trachea is midline. IMPRESSION: Negative for pneumothorax following thoracentesis. Persistent left lower lobe partial collapse / consolidation. Electronically Signed   By: Jerilynn Mages.  Shick M.D.   On: 02/10/2017 07:56   Dg Chest Port 1 View  Result Date: 02/09/2017 CLINICAL DATA:  Thoracentesis EXAM: PORTABLE CHEST 1 VIEW COMPARISON:  02/09/2017  FINDINGS: Substantial reduction of left pleural fluid volume. No pneumothorax. Mild residual left base opacity likely represents a combination of consolidation and residual pleural fluid. The right lung is clear. Moderate cardiomegaly persists. Grossly intact appearances of the transvenous cardiac leads. IMPRESSION: Substantial reduction of pleural fluid volume after left thoracentesis. No pneumothorax. Electronically Signed   By: Andreas Newport M.D.   On: 02/09/2017 03:11   Dg Chest Port 1 View  Result Date: 02/09/2017 CLINICAL DATA:  61 y/o  M; shortness of breath. EXAM: PORTABLE CHEST 1 VIEW COMPARISON:  None. FINDINGS: Near complete opacification of the left lung probably representing a large effusion lung consolidation. Grossly clear right lung. The cardiac silhouette is largely obscured by opacification of the left hemithorax. Partially visualized 2 lead pacemaker. The patient's chin obscures the lung apices. No acute osseous abnormality is evident. IMPRESSION: Complete opacification of the left lung probably representing large effusion and lung consolidation. Electronically Signed   By: Kristine Garbe M.D.   On: 02/09/2017 01:22    Time Spent in minutes  35   Louellen Molder M.D on 02/13/2017 at 12:12 PM  Between 7am to 7pm - Pager - [phone]  After 7pm go to [email or web address] - password [email or web address]  [facility] -  Office  [phone]     

## 2017-02-14 ENCOUNTER — Encounter (HOSPITAL_COMMUNITY)
Admission: EM | Disposition: A | Discharge status: Home / Self Care | Payer: Self-pay | Source: Other Acute Inpatient Hospital | Attending: Internal Medicine

## 2017-02-14 ENCOUNTER — Encounter (HOSPITAL_COMMUNITY): Payer: Self-pay | Admitting: Internal Medicine

## 2017-02-14 DIAGNOSIS — I255 Ischemic cardiomyopathy: Secondary | ICD-10-CM

## 2017-02-14 DIAGNOSIS — I2510 Atherosclerotic heart disease of native coronary artery without angina pectoris: Secondary | ICD-10-CM

## 2017-02-14 DIAGNOSIS — E876 Hypokalemia: Secondary | ICD-10-CM

## 2017-02-14 HISTORY — PX: RIGHT/LEFT HEART CATH AND CORONARY ANGIOGRAPHY: CATH118266

## 2017-02-14 LAB — CBC
HCT: 31.8 % — ABNORMAL LOW (ref 39.0–52.0)
HEMOGLOBIN: 9.6 g/dL — AB (ref 13.0–17.0)
MCH: 26.2 pg (ref 26.0–34.0)
MCHC: 30.2 g/dL (ref 30.0–36.0)
MCV: 86.6 fL (ref 78.0–100.0)
PLATELETS: 246 10*3/uL (ref 150–400)
RBC: 3.67 MIL/uL — AB (ref 4.22–5.81)
RDW: 17.9 % — ABNORMAL HIGH (ref 11.5–15.5)
WBC: 8.8 10*3/uL (ref 4.0–10.5)

## 2017-02-14 LAB — POCT I-STAT 3, ART BLOOD GAS (G3+)
Acid-Base Excess: 5 mmol/L — ABNORMAL HIGH (ref 0.0–2.0)
BICARBONATE: 29.4 mmol/L — AB (ref 20.0–28.0)
O2 Saturation: 94 %
PCO2 ART: 39.7 mmHg (ref 32.0–48.0)
PH ART: 7.478 — AB (ref 7.350–7.450)
TCO2: 31 mmol/L (ref 0–100)
pO2, Arterial: 66 mmHg — ABNORMAL LOW (ref 83.0–108.0)

## 2017-02-14 LAB — PROTIME-INR
INR: 1.29
Prothrombin Time: 16.1 seconds — ABNORMAL HIGH (ref 11.4–15.2)

## 2017-02-14 LAB — POCT I-STAT 3, VENOUS BLOOD GAS (G3P V)
Acid-Base Excess: 7 mmol/L — ABNORMAL HIGH (ref 0.0–2.0)
Acid-Base Excess: 5 mmol/L — ABNORMAL HIGH (ref 0.0–2.0)
Bicarbonate: 31.3 mmol/L — ABNORMAL HIGH (ref 20.0–28.0)
Bicarbonate: 29.8 mmol/L — ABNORMAL HIGH (ref 20.0–28.0)
O2 Saturation: 57 %
O2 Saturation: 60 %
PCO2 VEN: 44.7 mmHg (ref 44.0–60.0)
PCO2 VEN: 43.6 mmHg — AB (ref 44.0–60.0)
PH VEN: 7.453 — AB (ref 7.250–7.430)
PO2 VEN: 29 mmHg — AB (ref 32.0–45.0)
TCO2: 33 mmol/L (ref 0–100)
TCO2: 31 mmol/L (ref 0–100)
pH, Ven: 7.443 — ABNORMAL HIGH (ref 7.250–7.430)
pO2, Ven: 30 mmHg — CL (ref 32.0–45.0)

## 2017-02-14 LAB — GLUCOSE, CAPILLARY
GLUCOSE-CAPILLARY: 116 mg/dL — AB (ref 65–99)
GLUCOSE-CAPILLARY: 111 mg/dL — AB (ref 65–99)
Glucose-Capillary: 86 mg/dL (ref 65–99)
Glucose-Capillary: 139 mg/dL — ABNORMAL HIGH (ref 65–99)

## 2017-02-14 LAB — BASIC METABOLIC PANEL
ANION GAP: 7 (ref 5–15)
BUN: 25 mg/dL — ABNORMAL HIGH (ref 6–20)
CALCIUM: 8.5 mg/dL — AB (ref 8.9–10.3)
CHLORIDE: 98 mmol/L — AB (ref 101–111)
CO2: 28 mmol/L (ref 22–32)
Creatinine, Ser: 0.92 mg/dL (ref 0.61–1.24)
GFR calc non Af Amer: >60 mL/min (ref >60)
Glucose, Bld: 89 mg/dL (ref 65–99)
POTASSIUM: 3.8 mmol/L (ref 3.5–5.1)
Sodium: 133 mmol/L — ABNORMAL LOW (ref 135–145)

## 2017-02-14 LAB — CARDIAC CATHETERIZATION: Cath EF Quantitative: 15 %

## 2017-02-14 SURGERY — RIGHT/LEFT HEART CATH AND CORONARY ANGIOGRAPHY
Anesthesia: LOCAL

## 2017-02-14 MED ORDER — SODIUM CHLORIDE 0.9 % IV SOLN: INTRAVENOUS | Status: AC

## 2017-02-14 MED ORDER — MIDAZOLAM HCL 2 MG/2ML IJ SOLN
INTRAMUSCULAR | Status: AC
  Filled 2017-02-14: qty 2

## 2017-02-14 MED ORDER — ONDANSETRON HCL 4 MG/2ML IJ SOLN: 4.0000 mg | Freq: Four times a day (QID) | INTRAMUSCULAR | Status: DC | PRN

## 2017-02-14 MED ORDER — ENOXAPARIN SODIUM 40 MG/0.4ML ~~LOC~~ SOLN
40.0000 mg | SUBCUTANEOUS | Status: DC
  Administered 2017-02-15 – 2017-02-23 (×8): 40 mg via SUBCUTANEOUS
  Filled 2017-02-14 (×8): qty 0.4

## 2017-02-14 MED ORDER — MIDAZOLAM HCL 2 MG/2ML IJ SOLN
INTRAMUSCULAR | Status: DC | PRN
  Administered 2017-02-14: 1 mg via INTRAVENOUS

## 2017-02-14 MED ORDER — FENTANYL CITRATE (PF) 100 MCG/2ML IJ SOLN
INTRAMUSCULAR | Status: AC
  Filled 2017-02-14: qty 2

## 2017-02-14 MED ORDER — SODIUM CHLORIDE 0.9% FLUSH
3.0000 mL | INTRAVENOUS | Status: DC | PRN
  Administered 2017-02-19: 3 mL via INTRAVENOUS
  Filled 2017-02-14: qty 3

## 2017-02-14 MED ORDER — LIDOCAINE HCL (PF) 1 % IJ SOLN
INTRAMUSCULAR | Status: DC | PRN
  Administered 2017-02-14 (×2): 2 mL

## 2017-02-14 MED ORDER — LIDOCAINE HCL 1 % IJ SOLN
INTRAMUSCULAR | Status: AC
  Filled 2017-02-14: qty 20

## 2017-02-14 MED ORDER — ACETAMINOPHEN 325 MG PO TABS: 650.0000 mg | ORAL_TABLET | ORAL | Status: DC | PRN

## 2017-02-14 MED ORDER — HEPARIN (PORCINE) IN NACL 2-0.9 UNIT/ML-% IJ SOLN
INTRAMUSCULAR | Status: AC | PRN
  Administered 2017-02-14: 1000 mL

## 2017-02-14 MED ORDER — SODIUM CHLORIDE 0.9% FLUSH
3.0000 mL | Freq: Two times a day (BID) | INTRAVENOUS | Status: DC
  Administered 2017-02-14 – 2017-02-23 (×15): 3 mL via INTRAVENOUS

## 2017-02-14 MED ORDER — HEPARIN SODIUM (PORCINE) 1000 UNIT/ML IJ SOLN
INTRAMUSCULAR | Status: DC | PRN
  Administered 2017-02-14: 3500 [IU] via INTRAVENOUS

## 2017-02-14 MED ORDER — FENTANYL CITRATE (PF) 100 MCG/2ML IJ SOLN
INTRAMUSCULAR | Status: DC | PRN
  Administered 2017-02-14: 25 ug via INTRAVENOUS

## 2017-02-14 MED ORDER — VERAPAMIL HCL 2.5 MG/ML IV SOLN
INTRAVENOUS | Status: DC | PRN
  Administered 2017-02-14: 10 mL via INTRA_ARTERIAL

## 2017-02-14 MED ORDER — IOPAMIDOL (ISOVUE-370) INJECTION 76%
INTRAVENOUS | Status: DC | PRN
  Administered 2017-02-14: 70 mL via INTRA_ARTERIAL

## 2017-02-14 MED ORDER — VERAPAMIL HCL 2.5 MG/ML IV SOLN
INTRAVENOUS | Status: AC
  Filled 2017-02-14: qty 2

## 2017-02-14 MED ORDER — IOPAMIDOL (ISOVUE-370) INJECTION 76%
INTRAVENOUS | Status: AC
  Filled 2017-02-14: qty 100

## 2017-02-14 MED ORDER — HEPARIN SODIUM (PORCINE) 1000 UNIT/ML IJ SOLN
INTRAMUSCULAR | Status: AC
  Filled 2017-02-14: qty 1

## 2017-02-14 MED ORDER — HEPARIN (PORCINE) IN NACL 2-0.9 UNIT/ML-% IJ SOLN
INTRAMUSCULAR | Status: AC
  Filled 2017-02-14: qty 1000

## 2017-02-14 MED ORDER — SODIUM CHLORIDE 0.9 % IV SOLN: 250.0000 mL | INTRAVENOUS | Status: DC | PRN

## 2017-02-14 SURGICAL SUPPLY — 13 items
CATH BALLN WEDGE 5F 110CM (CATHETERS) ×2 IMPLANT
CATH INFINITI 5 FR JL3.5 (CATHETERS) ×2 IMPLANT
CATH INFINITI JR4 5F (CATHETERS) ×2 IMPLANT
DEVICE RAD COMP TR BAND LRG (VASCULAR PRODUCTS) ×2 IMPLANT
GLIDESHEATH SLEND SS 6F .021 (SHEATH) ×2 IMPLANT
GUIDEWIRE INQWIRE 1.5J.035X260 (WIRE) ×1 IMPLANT
INQWIRE 1.5J .035X260CM (WIRE) ×2
KIT HEART LEFT (KITS) ×2 IMPLANT
PACK CARDIAC CATHETERIZATION (CUSTOM PROCEDURE TRAY) ×2 IMPLANT
SHEATH GLIDE SLENDER 4/5FR (SHEATH) ×2 IMPLANT
TRANSDUCER W/STOPCOCK (MISCELLANEOUS) ×2 IMPLANT
TUBING CIL FLEX 10 FLL-RA (TUBING) ×2 IMPLANT
WIRE HI TORQ VERSACORE-J 145CM (WIRE) ×2 IMPLANT

## 2017-02-14 NOTE — Progress Notes (Signed)
TR band removed from Right radial site without complications; remains Level 0.  Gauze and tegaderm dressing placed.

## 2017-02-14 NOTE — Clinical Social Work Note (Signed)
CSW met with patient to discuss SNF placement. He states that he was at [facility] in [place], [place] for about three weeks and was discharged from there last Tuesday (02/08/2017). Patient is in his copay days and cannot afford the estimated $160 per day. Patient does not believe the rehab was helpful. Patient is agreeable to HHPT. He plans to discharge home with his sister and brother-in-law in [place]. They will be going to visit their grandson in about two weeks at which point the patient will go to his other sister's house in [place]. RNCM notified.  CSW signing off. Consult again if any other social work needs arise.  Dayton Scrape, [place]

## 2017-02-14 NOTE — Progress Notes (Signed)
Physical Therapy Treatment Patient Details Name: Stephen Cardenas MRN: [id number] DOB: July 11, 1956 Today's Date: 02/14/2017    History of Present Illness 61 year old male with chronic systolic CHF (EF of 74%), CAD with history of MI, AICD, COPD, OSA on CPAP, hypertension, hypothyroidism, recently treated for HCT AP and multiple recent hospitalization who was discharged from [facility] 3 weeks back and then subsequently admitted to [facility], discharged to rehabilitation from there to rehabilitation with failure to thrive presented to the ED with increasing shortness of breath. EMS was called and patient taken to [facility] and placed on BiPAP. Chest x-ray showed large lateral left pleural effusion. He received IV Zosyn and Levaquin in the hospital. CT chest was unable to be obtained since patient unable to lie down flat. He was then referred to critical care at [facility] and transferred here for further management. Patient admitted for sepsis possibly secondary to left parapneumonic effusion.    PT Comments    Pt admitted with above diagnosis. Pt currently with functional limitations due to balance and endurance deficits.  Pt still resting after cath and had been up earlier to bathroom several times.  Performed exercises with pt.  Checked O2 at rest with pt 93-95% on RA at rest.  Pt desat to  Pt will benefit from skilled PT to increase their independence and safety with mobility to allow discharge to to 81-84% on RA with exercise.  Pt did not desat doing exercises on 2LO2.  Will probably need O2 at home.  Will follow acutely.     Follow Up Recommendations  SNF;Supervision/Assistance - 24 hour (If pt declines SNF, HHPT recommended)     Equipment Recommendations  3in1 (PT) (home O2)    Recommendations for Other Services       Precautions / Restrictions Precautions Precautions: Fall Restrictions Weight Bearing Restrictions: No    Mobility  Bed  Mobility Overal bed mobility: Independent                Transfers                    Ambulation/Gait                 Stairs            Wheelchair Mobility    Modified Rankin (Stroke Patients Only)       Balance                                            Cognition Arousal/Alertness: Awake/alert Behavior During Therapy: WFL for tasks assessed/performed Overall Cognitive Status: Within Functional Limits for tasks assessed                                        Exercises General Exercises - Lower Extremity Ankle Circles/Pumps: AROM;Both;10 reps Heel Slides: AROM;Both;15 reps;Supine Hip ABduction/ADduction: AROM;Both;15 reps;Supine    General Comments        Pertinent Vitals/Pain Pain Assessment: No/denies pain    Home Living                      Prior Function            PT Goals (current goals can now be found in the care plan section)  Progress towards PT goals: Progressing toward goals    Frequency    Min 3X/week      PT Plan Current plan remains appropriate    Co-evaluation              AM-PAC PT "6 Clicks" Daily Activity  Outcome Measure  Difficulty turning over in bed (including adjusting bedclothes, sheets and blankets)?: None Difficulty moving from lying on back to sitting on the side of the bed? : None Difficulty sitting down on and standing up from a chair with arms (e.g., wheelchair, bedside commode, etc,.)?: None Help needed moving to and from a bed to chair (including a wheelchair)?: A Little Help needed walking in hospital room?: A Little Help needed climbing 3-5 steps with a railing? : A Little 6 Click Score: 21    End of Session Equipment Utilized During Treatment: Gait belt;Oxygen Activity Tolerance: Patient limited by fatigue Patient left: in bed;with call bell/phone within reach Nurse Communication: Mobility status PT Visit Diagnosis: Muscle  weakness (generalized) (M62.81);Unsteadiness on feet (R26.81)     Time: 1550-1603 PT Time Calculation (min) (ACUTE ONLY): 13 min  Charges:  $Therapeutic Exercise: 8-22 mins                    G Codes:       Dotty Gonzalo,PT Acute Rehabilitation [phone] (pager)    Denice Paradise 02/14/2017, 5:51 PM

## 2017-02-14 NOTE — Interval H&P Note (Signed)
History and Physical Interval Note:  02/14/2017 9:10 AM  Stephen Cardenas  has presented today for surgery, with the diagnosis of heart failure  The various methods of treatment have been discussed with the patient and family. After consideration of risks, benefits and other options for treatment, the patient has consented to  Procedure(s): Right/Left Heart Cath and Coronary Angiography (N/A) and possible coronary angioplasty as a surgical intervention .  The patient's history has been reviewed, patient examined, no change in status, stable for surgery.  I have reviewed the patient's chart and labs.  Questions were answered to the patient's satisfaction.     Essa Malachi, Quillian Quince

## 2017-02-14 NOTE — Progress Notes (Signed)
PT Cancellation Note  Patient Details Name: Stephen Cardenas MRN: [id number] DOB: [birth year]   Cancelled Treatment:    Reason Eval/Treat Not Completed: Patient at procedure or test/unavailable (In CA[facility] lab.  Will most likely be on bedrest s/p cath. )Will return as able.  Thanks.    Godfrey Pick Lamir Racca 02/14/2017, 9:56 AM Amanda Cockayne Acute Rehabilitation [phone] (pager)

## 2017-02-14 NOTE — Care Management Note (Addendum)
Case Management Note  Patient Details  Name: Stephen Cardenas MRN: [id number] Date of Birth: 1955-10-18  Action/Plan: Received call from Annye Rusk stating that patient will be returning home at discharge; patient is in his co pay day and cannot afford $160 per day to be at a SNF;  CM talked to patient to offer [facility] choices, pt chose [facility]); Mary with [facility] called for arrangements; patient will be staying with his sister at discharge; CM will continue to follow for DCP  Expected Discharge Date:   possibly 02/15/2017               Expected Discharge Plan:  Home with [place] services In-House Referral:  Clinical Social Work  Discharge planning Services  CM Consult  Choice offered to:  Patient  HH Arranged:  Therapist, sports, PT [place] Agency:  [facility] ([facility])  Status of Service:  In process, will continue to follow  Sherrilyn Rist [phone] 02/14/2017, 1:46 PM

## 2017-02-14 NOTE — Progress Notes (Addendum)
PROGRESS NOTE                                                                                                                                                                                                             Patient Demographics:    Stephen Cardenas, is a 61 y.o. male, DOB - September 11, 1955, XBL:[id number]  Admit date - 02/08/2017   Admitting Physician Collene Gobble, MD  Outpatient Primary MD for the patient is System, Pcp Not In  LOS - 6  Outpatient Specialists:Cardiologist in [place]  No chief complaint on file.      Brief Narrative   61 year old male with chronic systolic CHF (EF of 30%), CAD with history of MI, AICD, COPD, OSA on CPAP, hypertension, hypothyroidism, recently treated for HCT AP and multiple recent hospitalization who was discharged from [facility] 3 weeks back and then subsequently admitted to [facility], discharged to rehabilitation from there to rehabilitation with failure to thrive presented to the ED with increasing shortness of breath. EMS was called and patient taken to [facility] and placed on BiPAP. Chest x-ray showed large lateral left pleural effusion. He received IV Zosyn and Levaquin in the hospital. CT chest was unable to be obtained since patient unable to lie down flat. He was then referred to critical care at [facility] and transferred here for further management. Patient admitted for sepsis possibly secondary to left parapneumonic effusion.   Subjective:   Denies worsened shortness of breath. Feels tired. No chest pain symptoms.  Assessment  & Plan :   Acute respiratory failure with hypoxia (HCC) Secondary to acute on chronic CHF and left pleural effusion. s/p left thoracentesis on 6/27 with improvement. Empirically treated for HCAP .Pleural fluid cultures negative.  Continue O2 at rest and CPAP at night.  Repeat chest CT shows moderate residual left  pleural effusion at the lung base. Again shows large mediastinal adenopathy.  Acute on chronic systolic CHF ([place]) Started on IV Lasix with good diuresis. (Weight down 9 pounds sinceadmission), transitioned to oral Lasix in am. 2-D echo shows worsening EF of 15-20% with moderate to severe MR, dilated LA and right ventricle. Severe PAH (65 mmHg) -Heart failure consult appreciated. Added digoxin , and lack on an ARB. Continue aspirin and statin. Holding beta blocker given acute decompensation.  Right and left heart  catheterization done showing severe three-vessel disease with multiple areas of in-stent restenosis. Shows severe cardiomyopathy with EF of 15% Does not appear candidate for CBG.  Severe sepsis ([place]) Resolved. secondary to pneumonia with ?parapneumonic effusion. (although fluid is transudative). Cultures (both blood and pleural fluid) negative. Antibiotics narrowed to Levaquin and treat for total 8 days.   Chest pain with elevated troponin Suspect demand ischemia with acute CHF. Now resolved.   COPD Continue Pulmicort and Brovana. When necessary albuterol nebs.  Acute kidney injury Possibly due to sepsis. Resolved.  Coronary artery disease History of PCI. Continue aspirin and Brilinta   Iron deficiency anemia Added supplement. On B12 supplement.   Mediastinal adenopathy Seen on CT. Will need PET-CT as outpatient versus lung biopsy.  Hypokalemia Replenish   Code Status : Full code  Family Communication  : None at bedside  Disposition Plan  : Possibly needs SNF  Barriers For Discharge : Active symptoms  Consults  :   PC CM Heart failure  Procedures  :  CT chest Left thoracentesis 2-D echo Left/right heart catheterization  DVT Prophylaxis  :  Lovenox -  Lab Results  Component Value Date   PLT 246 02/14/2017    Antibiotics  :    Anti-infectives    Start     Dose/Rate Route Frequency Ordered Stop   02/12/17 1030  levofloxacin (LEVAQUIN) tablet 750  mg     750 mg Oral Daily 02/12/17 1021     02/10/17 1500  vancomycin (VANCOCIN) IVPB 750 mg/150 ml premix  Status:  Discontinued     750 mg 150 mL/hr over 60 Minutes Intravenous Every 8 hours 02/10/17 0748 02/12/17 0935   02/09/17 2000  levofloxacin (LEVAQUIN) IVPB 750 mg  Status:  Discontinued     750 mg 100 mL/hr over 90 Minutes Intravenous Every 24 hours 02/09/17 0132 02/09/17 1036   02/09/17 1800  vancomycin (VANCOCIN) IVPB 1000 mg/200 mL premix  Status:  Discontinued     1,000 mg 200 mL/hr over 60 Minutes Intravenous Every 12 hours 02/09/17 0132 02/10/17 0748   02/09/17 0230  vancomycin (VANCOCIN) 1,500 mg in sodium chloride 0.9 % 500 mL IVPB     1,500 mg 250 mL/hr over 120 Minutes Intravenous  Once 02/09/17 0150 02/09/17 0431   02/09/17 0200  meropenem (MERREM) 1 g in sodium chloride 0.9 % 100 mL IVPB  Status:  Discontinued     1 g 200 mL/hr over 30 Minutes Intravenous Every 8 hours 02/09/17 0132 02/12/17 0935        Objective:   Vitals:   02/14/17 0943 02/14/17 0944 02/14/17 0948 02/14/17 1018  BP:  120/89 117/86 118/76  Pulse: 88 88 86 84  Resp: (!) 58 (!) 26 11   Temp:      TempSrc:      SpO2: 96% 97% 96%   Weight:      Height:        Wt Readings from Last 3 Encounters:  02/14/17 71.5 kg (157 lb 9.6 oz)     Intake/Output Summary (Last 24 hours) at 02/14/17 1119 Last data filed at 02/14/17 0756  Gross per 24 hour  Intake           605.33 ml  Output             1750 ml  Net         -1144.67 ml     Physical Exam  Gen.: Middle aged male appears fatigued  HEENT: JVD +, moist  mucosa, supple neck Chest: fine Bibasilar crackles CVS: Normal S1 and S2, systolic murmur/6 Musculoskeletal: Warm, no edema        Data Review:    CBC  Recent Labs Lab 02/09/17 0245 02/09/17 2227 02/10/17 0320 02/11/17 0340 02/14/17 0612  WBC 10.1  --  15.4* 11.9* 8.8  HGB 9.4*  --  8.7* 9.0* 9.6*  HCT 30.2* 28.9* 28.4* 30.1* 31.8*  PLT 234  --  228 250 246  MCV 88.0   --  86.3 89.1 86.6  MCH 27.4  --  26.4 26.6 26.2  MCHC 31.1  --  30.6 29.9* 30.2  RDW 17.8*  --  17.6* 18.3* 17.9*    Chemistries   Recent Labs Lab 02/09/17 0245 02/10/17 0320 02/11/17 0340 02/12/17 0456 02/13/17 0417 02/14/17 0612  NA 135 135 134* 132* 131* 133*  K 3.6 4.0 4.5 4.3 3.5 3.8  CL 100* 102 101 100* 97* 98*  CO2 22 26 25 25 26 28   GLUCOSE 197* 101* 92 112* 137* 89  BUN 26* 21* 22* 27* 29* 25*  CREATININE 1.23 0.86 0.99 1.00 0.97 0.92  CALCIUM 8.9 8.4* 8.5* 8.7* 8.3* 8.5*  MG 1.6*  --  2.0  --   --   --    ------------------------------------------------------------------------------------------------------------------  Recent Labs  02/12/17 0456  CHOL 113  HDL 24*  LDLCALC 68  TRIG 106  CHOLHDL 4.7    No results found for: HGBA1C ------------------------------------------------------------------------------------------------------------------ No results for input(s): TSH, T4TOTAL, T3FREE, THYROIDAB in the last 72 hours.  Invalid input(s): FREET3 ------------------------------------------------------------------------------------------------------------------  Recent Labs  02/11/17 1553  TIBC 417  IRON 43*    Coagulation profile  Recent Labs Lab 02/09/17 1207 02/14/17 0612  INR 1.51 1.29    No results for input(s): DDIMER in the last 72 hours.  Cardiac Enzymes  Recent Labs Lab 02/12/17 0012 02/12/17 0456 02/12/17 1221  TROPONINI 0.10* 0.10* 0.09*   ------------------------------------------------------------------------------------------------------------------    Component Value Date/Time   BNP >4,500.0 (H) 02/12/2017 1221    Inpatient Medications  Scheduled Meds: . arformoterol  15 mcg Nebulization BID  . [START ON 02/15/2017] aspirin EC  81 mg Oral Daily  . budesonide (PULMICORT) nebulizer solution  0.5 mg Nebulization BID  . cholecalciferol  2,000 Units Oral Daily  . digoxin  0.125 mg Oral Daily  . docusate sodium  100  mg Oral Daily  . DULoxetine  60 mg Oral Daily  . [START ON 02/15/2017] enoxaparin (LOVENOX) injection  40 mg Subcutaneous Q24H  . ferrous sulfate  325 mg Oral BID WC  . furosemide  80 mg Intravenous BID  . insulin aspart  0-5 Units Subcutaneous QHS  . insulin aspart  0-9 Units Subcutaneous TID WC  . ipratropium-albuterol  3 mL Nebulization BID  . levofloxacin  750 mg Oral Daily  . levothyroxine  150 mcg Oral QAC breakfast  . losartan  25 mg Oral Daily  . pantoprazole (PROTONIX) IV  40 mg Intravenous QHS  . rosuvastatin  20 mg Oral q1800  . sodium chloride flush  3 mL Intravenous Q12H  . spironolactone  12.5 mg Oral Daily  . ticagrelor  90 mg Oral BID  . vitamin B-12  2,000 mcg Oral Daily   Continuous Infusions: . sodium chloride 50 mL/hr at 02/14/17 1015  . sodium chloride     PRN Meds:.sodium chloride, acetaminophen, albuterol, ALPRAZolam, ondansetron (ZOFRAN) IV, sodium chloride flush  Micro Results Recent Results (from the past 240 hour(s))  MRSA PCR Screening  Status: Abnormal   Collection Time: 02/08/17 11:49 PM  Result Value Ref Range Status   MRSA by PCR POSITIVE (A) NEGATIVE Final    Comment:        The GeneXpert MRSA Assay (FDA approved for NASAL specimens only), is one component of a comprehensive MRSA colonization surveillance program. It is not intended to diagnose MRSA infection nor to guide or monitor treatment for MRSA infections. RESULT CALLED TO, READ BACK BY AND VERIFIED WITH: C.EUBANKS,RN AT 0213 BY L.PITT 02/09/17   Body fluid culture (includes gram stain)     Status: None   Collection Time: 02/09/17  2:20 AM  Result Value Ref Range Status   Specimen Description FLUID PLEURAL  Final   Special Requests NONE  Final   Gram Stain   Final    FEW WBC PRESENT, PREDOMINANTLY MONONUCLEAR NO ORGANISMS SEEN    Culture NO GROWTH 3 DAYS  Final   Report Status 02/12/2017 FINAL  Final    Radiology Reports Ct Chest Wo Contrast  Result Date:  02/12/2017 CLINICAL DATA:  Pleural effusion EXAM: CT CHEST WITHOUT CONTRAST TECHNIQUE: Multidetector CT imaging of the chest was performed following the standard protocol without IV contrast. COMPARISON:  Radiograph 02/12/2017, CT 02/09/2017 FINDINGS: Cardiovascular: Limited evaluation without intravenous contrast. Aortic atherosclerosis. No aneurysmal dilatation. Ectasia of the ascending segment. Enlarged pulmonary arteries. Intracardiac pacing leads. Mild cardiomegaly. No large pericardial effusion. Coronary artery calcification. Mediastinum/Nodes: Midline trachea. No thyroid mass. Bulky mediastinal adenopathy re- demonstrated, right paratracheal node or mass measures 3.5 cm compared with 3.5 cm previously. Esophagus grossly unremarkable. Lungs/Pleura: Emphysema at the upper lobes. No focal consolidation or significant effusion on the right. Decreased pleural effusion in the left upper chest with some loculation at the apex and fluid along the fissure. Moderate subpulmonic pleural effusion remains. Consolidation within the left lower lobe and lingula may reflect atelectasis or pneumonia Upper Abdomen: No acute abnormality. Partially visualized probable 1.9 cm cyst in the mid right kidney. Musculoskeletal: Moderate severe compression fracture of L1, uncertain chronicity, with retropulsion of 7 mm on sagittal views. Estimated 50% loss of vertebral body height anteriorly. No other significant osseous abnormality. IMPRESSION: 1. Slight decreased pleural effusion at the left upper lobe with fluid tracking along the left pulmonary fissure. Moderate residual left pleural effusion at the lung base, largely subpulmonic. 2. No change in bulky mediastinal adenopathy, which may be secondary to infection, inflammation, or neoplasm. Correlation with PET-CT or possible tissue sampling previously recommended. 3. Patchy consolidation in the lingula and left lower lobe may reflect atelectasis or pneumonia 4. Moderate severe  compression fracture of L1, uncertain chronicity, with 7 mm of retropulsion. Aortic Atherosclerosis (ICD10-I70.0) and Emphysema (ICD10-J43.9). Electronically Signed   By: Donavan Foil M.D.   On: 02/12/2017 20:15   Ct Chest Wo Contrast  Result Date: 02/09/2017 CLINICAL DATA:  Evaluate pleural effusion EXAM: CT CHEST WITHOUT CONTRAST TECHNIQUE: Multidetector CT imaging of the chest was performed following the standard protocol without IV contrast. COMPARISON:  Earlier same day FINDINGS: Cardiovascular: Marked cardiomegaly. No pericardial effusion. Left anterior chest wall pacer leads terminate within the right atrium and ventricle. Post coronary artery stent placement. Mild ectasia of the ascending thoracic aorta measuring 30 mm in diameter. The main pulmonary artery is enlarged measuring 3.5 mm in diameter. Mediastinum/Nodes: Bulky mediastinal adenopathy with index right paratracheal lymph node measuring 3.5 cm in greatest short axis diameter (image 30, series 3) and index precarinal lymph node measuring 3 cm. Suspected right hilar adenopathy though this  is suboptimally evaluated on this noncontrast examination. No axillary adenopathy. Lungs/Pleura: Interval reduction in persistent small left-sided pleural effusion post thoracentesis. No pneumothorax. A small amount of fluid is again seen tracking within the left major fissure. Mild apical predominant centrilobular emphysematous change. No pneumothorax. Improved aeration of the left lung with persistent perihilar ground-glass opacities and left basilar consolidative opacities with associated air bronchograms. Minimal right basilar heterogeneous opacities favored to represent atelectasis. Punctate (approximately 2 mm) calcified granuloma within the left upper lobe (image 16, series 4). No discrete pulmonary nodules or masses given limitation of the examination. Upper Abdomen: Limited noncontrast evaluation of the upper abdomen is unremarkable. Musculoskeletal: No  acute or aggressive osseous abnormalities. IMPRESSION: 1. Interval reduction in persistent small left-sided effusion post thoracentesis. No pneumothorax. 2. Improved aeration of the left lung with persistent left perihilar and basilar opacities, nonspecific with differential considerations including asymmetric pulmonary edema versus infection (including atypical etiologies). 3. Bulky mediastinal adenopathy, the etiology of which is not depicted on this examination, specifically, there is no discrete pulmonary nodule or mass identified on this noncontrast examination. Further evaluation with contrast-enhanced CT of the chest, abdomen and pelvis versus outpatient PET-CT could be performed as clinically indicated. Alternatively, bronchoscopic biopsy could be performed as indicated. 4. Cardiomegaly. 5. Aortic Atherosclerosis (ICD10-I70.0) and Emphysema (ICD10-J43.9). Electronically Signed   By: Sandi Mariscal M.D.   On: 02/09/2017 17:25   Dg Chest Port 1 View  Result Date: 02/12/2017 CLINICAL DATA:  Shortness of breath, 4 days duration.  Follow-up. EXAM: PORTABLE CHEST 1 VIEW COMPARISON:  02/10/2017 FINDINGS: Pacemaker/AID appears unchanged. Cardiomegaly persists. Persistent small to moderate size left effusion with left base atelectasis. Venous hypertension with early interstitial edema. IMPRESSION: Similar appearance to the study of 2 days ago. Persistent small to moderate size left effusion with left base atelectasis. Mild interstitial edema. Electronically Signed   By: Nelson Chimes M.D.   On: 02/12/2017 10:10   Dg Chest Port 1 View  Result Date: 02/10/2017 CLINICAL DATA:  Recent thoracentesis, cough, shortness of breath, CHF EXAM: PORTABLE CHEST 1 VIEW COMPARISON:  02/09/2017 FINDINGS: Patient is status post left thoracentesis. No pneumothorax. Left basilar partial collapse/ consolidation persist. Stable cardiomegaly with central vascular congestion. Right lung remains clear. Left subclavian pacer noted.  Trachea is midline. IMPRESSION: Negative for pneumothorax following thoracentesis. Persistent left lower lobe partial collapse / consolidation. Electronically Signed   By: Jerilynn Mages.  Shick M.D.   On: 02/10/2017 07:56   Dg Chest Port 1 View  Result Date: 02/09/2017 CLINICAL DATA:  Thoracentesis EXAM: PORTABLE CHEST 1 VIEW COMPARISON:  02/09/2017 FINDINGS: Substantial reduction of left pleural fluid volume. No pneumothorax. Mild residual left base opacity likely represents a combination of consolidation and residual pleural fluid. The right lung is clear. Moderate cardiomegaly persists. Grossly intact appearances of the transvenous cardiac leads. IMPRESSION: Substantial reduction of pleural fluid volume after left thoracentesis. No pneumothorax. Electronically Signed   By: Andreas Newport M.D.   On: 02/09/2017 03:11   Dg Chest Port 1 View  Result Date: 02/09/2017 CLINICAL DATA:  61 y/o  M; shortness of breath. EXAM: PORTABLE CHEST 1 VIEW COMPARISON:  None. FINDINGS: Near complete opacification of the left lung probably representing a large effusion lung consolidation. Grossly clear right lung. The cardiac silhouette is largely obscured by opacification of the left hemithorax. Partially visualized 2 lead pacemaker. The patient's chin obscures the lung apices. No acute osseous abnormality is evident. IMPRESSION: Complete opacification of the left lung probably representing large effusion and lung  consolidation. Electronically Signed   By: Kristine Garbe M.D.   On: 02/09/2017 01:22    Time Spent in minutes  35   Louellen Molder M.D on 02/14/2017 at 11:19 AM  Between 7am to 7pm - Pager - [phone]  After 7pm go to [email or web address] - password [facility]  [facility] -  Office  [phone]

## 2017-02-14 NOTE — H&P (View-Only) (Signed)
Advanced Heart Failure Rounding Note  PCP:  Primary Cardiologist:   Subjective:    Feels better. Diuresed another 8 pounds. Weight down 15 pounds total. No CP or orthopnea.   Objective:   Weight Range: 71.5 kg (157 lb 9.6 oz) Body mass index is 22.61 kg/m.   Vital Signs:   Temp:  [97.7 F (36.5 C)-98.5 F (36.9 C)] 98.5 F (36.9 C) (07/02 0513) Pulse Rate:  [93-100] 97 (07/02 0811) Resp:  [17-18] 18 (07/02 0513) BP: (111-128)/(87-95) 127/95 (07/02 0811) SpO2:  [97 %-100 %] 100 % (07/02 0513) Weight:  [71.5 kg (157 lb 9.6 oz)] 71.5 kg (157 lb 9.6 oz) (07/02 0513) Last BM Date: 02/13/17  Weight change: Filed Weights   02/12/17 0613 02/13/17 0432 02/14/17 0513  Weight: 77.7 kg (171 lb 3.2 oz) 74.9 kg (165 lb 3.2 oz) 71.5 kg (157 lb 9.6 oz)    Intake/Output:   Intake/Output Summary (Last 24 hours) at 02/14/17 0906 Last data filed at 02/14/17 0756  Gross per 24 hour  Intake           605.33 ml  Output             2000 ml  Net         -1394.67 ml      Physical Exam    General:  Lying in bed, NAD HEENT: Normal Neck: Supple. JVP flat . Carotids 2+ bilat; no bruits. No lymphadenopathy or thyromegaly appreciated. Cor: PMI laterally displaced. Regular rate & rhythm. +s3 Lungs: Clear. Decreased at left base Abdomen: Soft NT/ND good BS Extremities: no cyanosis, clubbing, rash, edema Neuro: alert & oriented x 3, cranial nerves grossly intact. moves all 4 extremities w/o difficulty. Affect pleasant   Telemetry   NSR 70s Personally reviewed   EKG    NA  Labs    CBC No results for input(s): WBC, NEUTROABS, HGB, HCT, MCV, PLT in the last 72 hours. Basic Metabolic Panel  Recent Labs  02/13/17 0417 02/14/17 0612  NA 131* 133*  K 3.5 3.8  CL 97* 98*  CO2 26 28  GLUCOSE 137* 89  BUN 29* 25*  CREATININE 0.97 0.92  CALCIUM 8.3* 8.5*   Liver Function Tests No results for input(s): AST, ALT, ALKPHOS, BILITOT, PROT, ALBUMIN in the last 72 hours. No  results for input(s): LIPASE, AMYLASE in the last 72 hours. Cardiac Enzymes  Recent Labs  02/12/17 0012 02/12/17 0456 02/12/17 1221  TROPONINI 0.10* 0.10* 0.09*    BNP: BNP (last 3 results)  Recent Labs  02/12/17 1221  BNP >4,500.0*    ProBNP (last 3 results) No results for input(s): PROBNP in the last 8760 hours.   D-Dimer No results for input(s): DDIMER in the last 72 hours. Hemoglobin A1C No results for input(s): HGBA1C in the last 72 hours. Fasting Lipid Panel  Recent Labs  02/12/17 0456  CHOL 113  HDL 24*  LDLCALC 68  TRIG 106  CHOLHDL 4.7   Thyroid Function Tests No results for input(s): TSH, T4TOTAL, T3FREE, THYROIDAB in the last 72 hours.  Invalid input(s): FREET3  Other results:   Imaging    No results found.   Medications:     Scheduled Medications: . [MAR Hold] arformoterol  15 mcg Nebulization BID  . [MAR Hold] aspirin EC  81 mg Oral Daily  . [MAR Hold] budesonide (PULMICORT) nebulizer solution  0.5 mg Nebulization BID  . [MAR Hold] cholecalciferol  2,000 Units Oral Daily  . [MAR Hold] digoxin  0.125 mg Oral Daily  . [MAR Hold] docusate sodium  100 mg Oral Daily  . [MAR Hold] DULoxetine  60 mg Oral Daily  . [MAR Hold] ferrous sulfate  325 mg Oral BID WC  . [MAR Hold] furosemide  80 mg Intravenous BID  . [MAR Hold] insulin aspart  0-5 Units Subcutaneous QHS  . [MAR Hold] insulin aspart  0-9 Units Subcutaneous TID WC  . [MAR Hold] ipratropium-albuterol  3 mL Nebulization BID  . [MAR Hold] levofloxacin  750 mg Oral Daily  . [MAR Hold] levothyroxine  150 mcg Oral QAC breakfast  . [MAR Hold] losartan  25 mg Oral Daily  . [MAR Hold] pantoprazole (PROTONIX) IV  40 mg Intravenous QHS  . [MAR Hold] rosuvastatin  20 mg Oral q1800  . sodium chloride flush  3 mL Intravenous Q12H  . [MAR Hold] spironolactone  12.5 mg Oral Daily  . [MAR Hold] ticagrelor  90 mg Oral BID  . [MAR Hold] vitamin B-12  2,000 mcg Oral Daily    Infusions: .  sodium chloride    . sodium chloride 10 mL/hr at 02/14/17 0618  . heparin      PRN Medications: sodium chloride, [MAR Hold] albuterol, [MAR Hold] ALPRAZolam, heparin, sodium chloride flush    Patient Profile   60yoM with h/o chronic Systolic CHF (EF 69%), CAD s/p MI, ICD, COPD, OSA on CPAP, HTN, Hypothyroidism. Multiple recent hospitalizations admitted with recurrent respiratory failure and large left pleural effusion. Now s/p thoracentesis (transudative)   Assessment/Plan   1. Acute on chronic hypoxic respiratory failure with large left pleural effusion - breathing much improved after thoracentesis - by report tap was bloody but effusion is transudative by Light's criteria. Cytology only with mesothelial cells commonly seen in HF. I spoke with Dr. Titus Mould and we will plan repeat CT to better evaluate left lung for malignancy now that effusion is improved. My suspicion is that this is most likely related to HF - repeat CT 6/30 showed moderate residual effusion. Will continue to follow. Hopefully will improve with management of volume status - If recurs may need Pleurex - Desats with exertion. Will likely need home O2. - Continue pulmonary toilet.  - Being treated for levaquin x 8 days for possible PNA per primary  2. Large left pleural effusion - s/p thoracentesis -> transudative by Light's critreia - see above  3. Acute on chronic systolic HF due to iCM - EF 15-20% by echo 6/30. Moderate RV dysfunction. Viewed personally - Chronic NYHA IIIb-IV. Suspect low output - Diuresed well again overnight. Weight down 15 pound. Renal function stable - Stop IV lasix - Spiro and digoxin started yesterday. Add losartan 25 today.  -  R/L cath today -Given comorbidities I am not sure he will qualify for advanced therapies but may benefit from a trial of inotropes,if needed, to help him regain stamina so we can get him to advanced therapies  4. CAD s/p previous MI - Stable. No s/s  ischemia. Continue ASA and Brilinta. Crestor started yesterday - Plan R/L cath today as part of work-up for advanced therapies   5. COPD - stable. Management per primary  6. Mediastinal lymphadenopathy on chest CT - ? Related to HF or other underlying process. LDH ok. Consider outpatient PET  7. Failure to thrive/severe deconditioning - PT recommending SNF   8. Hypokalemia - K 3.8 Will supp  Length of Stay: 6   Glori Bickers, MD  02/14/2017, 9:06 AM  Advanced Heart Failure Team Pager  [phone] (M-F; 7a - 4p)  Please contact [facility] for night-coverage after hours (4p -7a ) and weekends on [email or web address]

## 2017-02-14 NOTE — Progress Notes (Signed)
SATURATION QUALIFICATIONS: (This note is used to comply with regulatory documentation for home oxygen)  Patient Saturations on Room Air at Rest = 94%  Patient Saturations on Room Air while Ambulating = 84-87%  Patient Saturations on 2 Liters of oxygen while Ambulating = 88-94%  Please briefly explain why patient needs home oxygen: Pt desats with activity on RA therefore will need home O2.  Thanks.   [facility] [phone] (pager) [facility] [phone] (pager)

## 2017-02-14 NOTE — Progress Notes (Signed)
Advanced Heart Failure Rounding Note  PCP:  Primary Cardiologist:   Subjective:    Feels better. Diuresed another 8 pounds. Weight down 15 pounds total. No CP or orthopnea.   Objective:   Weight Range: 71.5 kg (157 lb 9.6 oz) Body mass index is 22.61 kg/m.   Vital Signs:   Temp:  [97.7 F (36.5 C)-98.5 F (36.9 C)] 98.5 F (36.9 C) (07/02 0513) Pulse Rate:  [93-100] 97 (07/02 0811) Resp:  [17-18] 18 (07/02 0513) BP: (111-128)/(87-95) 127/95 (07/02 0811) SpO2:  [97 %-100 %] 100 % (07/02 0513) Weight:  [71.5 kg (157 lb 9.6 oz)] 71.5 kg (157 lb 9.6 oz) (07/02 0513) Last BM Date: 02/13/17  Weight change: Filed Weights   02/12/17 0613 02/13/17 0432 02/14/17 0513  Weight: 77.7 kg (171 lb 3.2 oz) 74.9 kg (165 lb 3.2 oz) 71.5 kg (157 lb 9.6 oz)    Intake/Output:   Intake/Output Summary (Last 24 hours) at 02/14/17 0906 Last data filed at 02/14/17 0756  Gross per 24 hour  Intake           605.33 ml  Output             2000 ml  Net         -1394.67 ml      Physical Exam    General:  Lying in bed, NAD HEENT: Normal Neck: Supple. JVP flat . Carotids 2+ bilat; no bruits. No lymphadenopathy or thyromegaly appreciated. Cor: PMI laterally displaced. Regular rate & rhythm. +s3 Lungs: Clear. Decreased at left base Abdomen: Soft NT/ND good BS Extremities: no cyanosis, clubbing, rash, edema Neuro: alert & oriented x 3, cranial nerves grossly intact. moves all 4 extremities w/o difficulty. Affect pleasant   Telemetry   NSR 70s Personally reviewed   EKG    NA  Labs    CBC No results for input(s): WBC, NEUTROABS, HGB, HCT, MCV, PLT in the last 72 hours. Basic Metabolic Panel  Recent Labs  02/13/17 0417 02/14/17 0612  NA 131* 133*  K 3.5 3.8  CL 97* 98*  CO2 26 28  GLUCOSE 137* 89  BUN 29* 25*  CREATININE 0.97 0.92  CALCIUM 8.3* 8.5*   Liver Function Tests No results for input(s): AST, ALT, ALKPHOS, BILITOT, PROT, ALBUMIN in the last 72 hours. No  results for input(s): LIPASE, AMYLASE in the last 72 hours. Cardiac Enzymes  Recent Labs  02/12/17 0012 02/12/17 0456 02/12/17 1221  TROPONINI 0.10* 0.10* 0.09*    BNP: BNP (last 3 results)  Recent Labs  02/12/17 1221  BNP >4,500.0*    ProBNP (last 3 results) No results for input(s): PROBNP in the last 8760 hours.   D-Dimer No results for input(s): DDIMER in the last 72 hours. Hemoglobin A1C No results for input(s): HGBA1C in the last 72 hours. Fasting Lipid Panel  Recent Labs  02/12/17 0456  CHOL 113  HDL 24*  LDLCALC 68  TRIG 106  CHOLHDL 4.7   Thyroid Function Tests No results for input(s): TSH, T4TOTAL, T3FREE, THYROIDAB in the last 72 hours.  Invalid input(s): FREET3  Other results:   Imaging    No results found.   Medications:     Scheduled Medications: . [MAR Hold] arformoterol  15 mcg Nebulization BID  . [MAR Hold] aspirin EC  81 mg Oral Daily  . [MAR Hold] budesonide (PULMICORT) nebulizer solution  0.5 mg Nebulization BID  . [MAR Hold] cholecalciferol  2,000 Units Oral Daily  . [MAR Hold] digoxin  0.125 mg Oral Daily  . [MAR Hold] docusate sodium  100 mg Oral Daily  . [MAR Hold] DULoxetine  60 mg Oral Daily  . [MAR Hold] ferrous sulfate  325 mg Oral BID WC  . [MAR Hold] furosemide  80 mg Intravenous BID  . [MAR Hold] insulin aspart  0-5 Units Subcutaneous QHS  . [MAR Hold] insulin aspart  0-9 Units Subcutaneous TID WC  . [MAR Hold] ipratropium-albuterol  3 mL Nebulization BID  . [MAR Hold] levofloxacin  750 mg Oral Daily  . [MAR Hold] levothyroxine  150 mcg Oral QAC breakfast  . [MAR Hold] losartan  25 mg Oral Daily  . [MAR Hold] pantoprazole (PROTONIX) IV  40 mg Intravenous QHS  . [MAR Hold] rosuvastatin  20 mg Oral q1800  . sodium chloride flush  3 mL Intravenous Q12H  . [MAR Hold] spironolactone  12.5 mg Oral Daily  . [MAR Hold] ticagrelor  90 mg Oral BID  . [MAR Hold] vitamin B-12  2,000 mcg Oral Daily    Infusions: .  sodium chloride    . sodium chloride 10 mL/hr at 02/14/17 0618  . heparin      PRN Medications: sodium chloride, [MAR Hold] albuterol, [MAR Hold] ALPRAZolam, heparin, sodium chloride flush    Patient Profile   60yoM with h/o chronic Systolic CHF (EF 87%), CAD s/p MI, ICD, COPD, OSA on CPAP, HTN, Hypothyroidism. Multiple recent hospitalizations admitted with recurrent respiratory failure and large left pleural effusion. Now s/p thoracentesis (transudative)   Assessment/Plan   1. Acute on chronic hypoxic respiratory failure with large left pleural effusion - breathing much improved after thoracentesis - by report tap was bloody but effusion is transudative by Light's criteria. Cytology only with mesothelial cells commonly seen in HF. I spoke with Dr. Titus Mould and we will plan repeat CT to better evaluate left lung for malignancy now that effusion is improved. My suspicion is that this is most likely related to HF - repeat CT 6/30 showed moderate residual effusion. Will continue to follow. Hopefully will improve with management of volume status - If recurs may need Pleurex - Desats with exertion. Will likely need home O2. - Continue pulmonary toilet.  - Being treated for levaquin x 8 days for possible PNA per primary  2. Large left pleural effusion - s/p thoracentesis -> transudative by Light's critreia - see above  3. Acute on chronic systolic HF due to iCM - EF 15-20% by echo 6/30. Moderate RV dysfunction. Viewed personally - Chronic NYHA IIIb-IV. Suspect low output - Diuresed well again overnight. Weight down 15 pound. Renal function stable - Stop IV lasix - Spiro and digoxin started yesterday. Add losartan 25 today.  -  R/L cath today -Given comorbidities I am not sure he will qualify for advanced therapies but may benefit from a trial of inotropes,if needed, to help him regain stamina so we can get him to advanced therapies  4. CAD s/p previous MI - Stable. No s/s  ischemia. Continue ASA and Brilinta. Crestor started yesterday - Plan R/L cath today as part of work-up for advanced therapies   5. COPD - stable. Management per primary  6. Mediastinal lymphadenopathy on chest CT - ? Related to HF or other underlying process. LDH ok. Consider outpatient PET  7. Failure to thrive/severe deconditioning - PT recommending SNF   8. Hypokalemia - K 3.8 Will supp  Length of Stay: 6   Glori Bickers, MD  02/14/2017, 9:06 AM  Advanced Heart Failure Team Pager  [phone] (M-F; 7a - 4p)  Please contact [facility] for night-coverage after hours (4p -7a ) and weekends on [email or web address]

## 2017-02-15 ENCOUNTER — Other Ambulatory Visit: Payer: Self-pay | Admitting: *Deleted

## 2017-02-15 ENCOUNTER — Encounter (HOSPITAL_COMMUNITY): Payer: Self-pay | Admitting: Internal Medicine

## 2017-02-15 DIAGNOSIS — I2510 Atherosclerotic heart disease of native coronary artery without angina pectoris: Secondary | ICD-10-CM

## 2017-02-15 LAB — BASIC METABOLIC PANEL
ANION GAP: 5 (ref 5–15)
BUN: 22 mg/dL — AB (ref 6–20)
CHLORIDE: 101 mmol/L (ref 101–111)
CO2: 28 mmol/L (ref 22–32)
Calcium: 8.4 mg/dL — ABNORMAL LOW (ref 8.9–10.3)
Creatinine, Ser: 1.2 mg/dL (ref 0.61–1.24)
GFR calc non Af Amer: >60 mL/min (ref >60)
Glucose, Bld: 103 mg/dL — ABNORMAL HIGH (ref 65–99)
POTASSIUM: 4.3 mmol/L (ref 3.5–5.1)
SODIUM: 134 mmol/L — AB (ref 135–145)

## 2017-02-15 LAB — GLUCOSE, CAPILLARY
GLUCOSE-CAPILLARY: 105 mg/dL — AB (ref 65–99)
GLUCOSE-CAPILLARY: 120 mg/dL — AB (ref 65–99)
GLUCOSE-CAPILLARY: 117 mg/dL — AB (ref 65–99)
GLUCOSE-CAPILLARY: 134 mg/dL — AB (ref 65–99)

## 2017-02-15 MED ORDER — FUROSEMIDE 80 MG PO TABS
80.0000 mg | ORAL_TABLET | Freq: Two times a day (BID) | ORAL | Status: DC
  Administered 2017-02-15: 80 mg via ORAL
  Filled 2017-02-15: qty 1

## 2017-02-15 NOTE — Progress Notes (Signed)
PROGRESS NOTE                                                                                                                                                                                                             Patient Demographics:    Stephen Cardenas, is a 61 y.o. male, DOB - 04/12/1956, KJZ:[id number]  Admit date - 02/08/2017   Admitting Physician Collene Gobble, MD  Outpatient Primary MD for the patient is System, Pcp Not In  LOS - 7  Outpatient Specialists:Cardiologist in [place]  No chief complaint on file.      Brief Narrative   61 year old male with chronic systolic CHF (EF of 94%), CAD with history of MI, AICD, COPD, OSA on CPAP, hypertension, hypothyroidism, recently treated for HCT AP and multiple recent hospitalization who was discharged from [facility] 3 weeks back and then subsequently admitted to [facility], discharged to rehabilitation from there to rehabilitation with failure to thrive presented to the ED with increasing shortness of breath. EMS was called and patient taken to [facility] and placed on BiPAP. Chest x-ray showed large lateral left pleural effusion. He received IV Zosyn and Levaquin in the hospital. CT chest was unable to be obtained since patient unable to lie down flat. He was then referred to critical care at [facility] and transferred here for further management. Patient admitted for sepsis possibly secondary to left parapneumonic effusion.   Subjective:    Denies worsening shortness of breath or chest pain. Weight up by 5 pounds since yesterday.   Assessment  & Plan :   Acute respiratory failure with hypoxia (HCC) Secondary to acute on chronic CHF and left pleural effusion. s/p left thoracentesis on 6/27 with improvement. Empirically treated for HCAP ( completes 8 days antibiotic course today)..Pleural fluid analysis suggests transudate, cultures  negative.  Continue O2 at rest and CPAP at night.  Repeat chest CT shows moderate residual left pleural effusion at the lung base. Again shows large mediastinal adenopathy.  Acute on chronic systolic CHF (HCC)  2-D echo shows worsening EF of 15-20% with moderate to severe MR, dilated LA and right ventricle. Severe PAH (65 mmHg) -Heart failure consult appreciated. Added digoxin , Aldactone and ARB. Continue aspirin and statin. Holding beta blocker given acute decompensation.  Right and left heart catheterization done showing severe  three-vessel disease with multiple areas of in-stent restenosis. Shows severe cardiomyopathy with EF of 15% Does not appear candidate for CABG. may benefit from inotropes.  Showed good diuresis with IV Lasix, transition to oral Lasix (40 mg twice a day) today. Further recommendation per HF team.    Severe sepsis ([place]) Resolved. secondary to pneumonia with ?parapneumonic effusion. (although fluid is transudative). Cultures negative. Completes 8 days of abx today.   Chest pain with elevated troponin Suspect demand ischemia with acute CHF. Now resolved.   COPD Continue Pulmicort and Brovana. When necessary albuterol nebs.  Acute kidney injury Possibly due to sepsis. Resolved.  Coronary artery disease History of PCI. Continue aspirin and Brilinta   Iron deficiency anemia Added supplement. On B12 supplement.   Mediastinal adenopathy Seen on CT. Will need PET-CT as outpatient versus lung biopsy.  Hypokalemia Replenished   Code Status : Full code  Family Communication  : None at bedside  Disposition Plan  : Refused SNF. Home with home Health once symptoms further improved.  Barriers For Discharge : Active symptoms  Consults  :   PC CM Heart failure  Procedures  :  CT chest Left thoracentesis 2-D echo Left/right heart catheterization  DVT Prophylaxis  :  Lovenox -  Lab Results  Component Value Date   PLT 246 02/14/2017     Antibiotics  :    Anti-infectives    Start     Dose/Rate Route Frequency Ordered Stop   02/12/17 1030  levofloxacin (LEVAQUIN) tablet 750 mg     750 mg Oral Daily 02/12/17 1021 02/16/17 2359   02/10/17 1500  vancomycin (VANCOCIN) IVPB 750 mg/150 ml premix  Status:  Discontinued     750 mg 150 mL/hr over 60 Minutes Intravenous Every 8 hours 02/10/17 0748 02/12/17 0935   02/09/17 2000  levofloxacin (LEVAQUIN) IVPB 750 mg  Status:  Discontinued     750 mg 100 mL/hr over 90 Minutes Intravenous Every 24 hours 02/09/17 0132 02/09/17 1036   02/09/17 1800  vancomycin (VANCOCIN) IVPB 1000 mg/200 mL premix  Status:  Discontinued     1,000 mg 200 mL/hr over 60 Minutes Intravenous Every 12 hours 02/09/17 0132 02/10/17 0748   02/09/17 0230  vancomycin (VANCOCIN) 1,500 mg in sodium chloride 0.9 % 500 mL IVPB     1,500 mg 250 mL/hr over 120 Minutes Intravenous  Once 02/09/17 0150 02/09/17 0431   02/09/17 0200  meropenem (MERREM) 1 g in sodium chloride 0.9 % 100 mL IVPB  Status:  Discontinued     1 g 200 mL/hr over 30 Minutes Intravenous Every 8 hours 02/09/17 0132 02/12/17 0935        Objective:   Vitals:   02/14/17 2100 02/15/17 0448 02/15/17 0752 02/15/17 1208  BP: 118/73 111/82  109/69  Pulse: 95 92  100  Resp: 18 18    Temp: 98.3 F (36.8 C) 97.9 F (36.6 C)    TempSrc: Oral Oral    SpO2: 98% 97% 99% 96%  Weight:  73.6 kg (162 lb 3.2 oz)    Height:        Wt Readings from Last 3 Encounters:  02/15/17 73.6 kg (162 lb 3.2 oz)     Intake/Output Summary (Last 24 hours) at 02/15/17 1333 Last data filed at 02/15/17 1205  Gross per 24 hour  Intake              775 ml  Output  1025 ml  Net             -250 ml     Physical Exam Gen.: Fatigued, not in distress HEENT: JVD +, moist mucosa, supple neck Chest: Clear bilaterally, no rhonchi or wheeze CVS: Normal S1 and S2, systolic murmur 3/6 GI: Soft, nondistended, nontender Musculoskeletal: Warm, no  edema        Data Review:    CBC  Recent Labs Lab 02/09/17 0245 02/09/17 2227 02/10/17 0320 02/11/17 0340 02/14/17 0612  WBC 10.1  --  15.4* 11.9* 8.8  HGB 9.4*  --  8.7* 9.0* 9.6*  HCT 30.2* 28.9* 28.4* 30.1* 31.8*  PLT 234  --  228 250 246  MCV 88.0  --  86.3 89.1 86.6  MCH 27.4  --  26.4 26.6 26.2  MCHC 31.1  --  30.6 29.9* 30.2  RDW 17.8*  --  17.6* 18.3* 17.9*    Chemistries   Recent Labs Lab 02/09/17 0245  02/11/17 0340 02/12/17 0456 02/13/17 0417 02/14/17 0612 02/15/17 0430  NA 135  < > 134* 132* 131* 133* 134*  K 3.6  < > 4.5 4.3 3.5 3.8 4.3  CL 100*  < > 101 100* 97* 98* 101  CO2 22  < > 25 25 26 28 28   GLUCOSE 197*  < > 92 112* 137* 89 103*  BUN 26*  < > 22* 27* 29* 25* 22*  CREATININE 1.23  < > 0.99 1.00 0.97 0.92 1.20  CALCIUM 8.9  < > 8.5* 8.7* 8.3* 8.5* 8.4*  MG 1.6*  --  2.0  --   --   --   --   < > = values in this interval not displayed. ------------------------------------------------------------------------------------------------------------------ No results for input(s): CHOL, HDL, LDLCALC, TRIG, CHOLHDL, LDLDIRECT in the last 72 hours.  No results found for: HGBA1C ------------------------------------------------------------------------------------------------------------------ No results for input(s): TSH, T4TOTAL, T3FREE, THYROIDAB in the last 72 hours.  Invalid input(s): FREET3 ------------------------------------------------------------------------------------------------------------------ No results for input(s): VITAMINB12, FOLATE, FERRITIN, TIBC, IRON, RETICCTPCT in the last 72 hours.  Coagulation profile  Recent Labs Lab 02/09/17 1207 02/14/17 0612  INR 1.51 1.29    No results for input(s): DDIMER in the last 72 hours.  Cardiac Enzymes  Recent Labs Lab 02/12/17 0012 02/12/17 0456 02/12/17 1221  TROPONINI 0.10* 0.10* 0.09*    ------------------------------------------------------------------------------------------------------------------    Component Value Date/Time   BNP >4,500.0 (H) 02/12/2017 1221    Inpatient Medications  Scheduled Meds: . arformoterol  15 mcg Nebulization BID  . aspirin EC  81 mg Oral Daily  . budesonide (PULMICORT) nebulizer solution  0.5 mg Nebulization BID  . cholecalciferol  2,000 Units Oral Daily  . digoxin  0.125 mg Oral Daily  . docusate sodium  100 mg Oral Daily  . DULoxetine  60 mg Oral Daily  . enoxaparin (LOVENOX) injection  40 mg Subcutaneous Q24H  . ferrous sulfate  325 mg Oral BID WC  . insulin aspart  0-5 Units Subcutaneous QHS  . insulin aspart  0-9 Units Subcutaneous TID WC  . ipratropium-albuterol  3 mL Nebulization BID  . levofloxacin  750 mg Oral Daily  . levothyroxine  150 mcg Oral QAC breakfast  . losartan  25 mg Oral Daily  . pantoprazole (PROTONIX) IV  40 mg Intravenous QHS  . rosuvastatin  20 mg Oral q1800  . sodium chloride flush  3 mL Intravenous Q12H  . spironolactone  12.5 mg Oral Daily  . ticagrelor  90 mg Oral BID  .  vitamin B-12  2,000 mcg Oral Daily   Continuous Infusions: . sodium chloride     PRN Meds:.sodium chloride, acetaminophen, albuterol, ALPRAZolam, ondansetron (ZOFRAN) IV, sodium chloride flush  Micro Results Recent Results (from the past 240 hour(s))  MRSA PCR Screening     Status: Abnormal   Collection Time: 02/08/17 11:49 PM  Result Value Ref Range Status   MRSA by PCR POSITIVE (A) NEGATIVE Final    Comment:        The GeneXpert MRSA Assay (FDA approved for NASAL specimens only), is one component of a comprehensive MRSA colonization surveillance program. It is not intended to diagnose MRSA infection nor to guide or monitor treatment for MRSA infections. RESULT CALLED TO, READ BACK BY AND VERIFIED WITH: C.EUBANKS,RN AT 0213 BY L.PITT 02/09/17   Body fluid culture (includes gram stain)     Status: None    Collection Time: 02/09/17  2:20 AM  Result Value Ref Range Status   Specimen Description FLUID PLEURAL  Final   Special Requests NONE  Final   Gram Stain   Final    FEW WBC PRESENT, PREDOMINANTLY MONONUCLEAR NO ORGANISMS SEEN    Culture NO GROWTH 3 DAYS  Final   Report Status 02/12/2017 FINAL  Final    Radiology Reports Ct Chest Wo Contrast  Result Date: 02/12/2017 CLINICAL DATA:  Pleural effusion EXAM: CT CHEST WITHOUT CONTRAST TECHNIQUE: Multidetector CT imaging of the chest was performed following the standard protocol without IV contrast. COMPARISON:  Radiograph 02/12/2017, CT 02/09/2017 FINDINGS: Cardiovascular: Limited evaluation without intravenous contrast. Aortic atherosclerosis. No aneurysmal dilatation. Ectasia of the ascending segment. Enlarged pulmonary arteries. Intracardiac pacing leads. Mild cardiomegaly. No large pericardial effusion. Coronary artery calcification. Mediastinum/Nodes: Midline trachea. No thyroid mass. Bulky mediastinal adenopathy re- demonstrated, right paratracheal node or mass measures 3.5 cm compared with 3.5 cm previously. Esophagus grossly unremarkable. Lungs/Pleura: Emphysema at the upper lobes. No focal consolidation or significant effusion on the right. Decreased pleural effusion in the left upper chest with some loculation at the apex and fluid along the fissure. Moderate subpulmonic pleural effusion remains. Consolidation within the left lower lobe and lingula may reflect atelectasis or pneumonia Upper Abdomen: No acute abnormality. Partially visualized probable 1.9 cm cyst in the mid right kidney. Musculoskeletal: Moderate severe compression fracture of L1, uncertain chronicity, with retropulsion of 7 mm on sagittal views. Estimated 50% loss of vertebral body height anteriorly. No other significant osseous abnormality. IMPRESSION: 1. Slight decreased pleural effusion at the left upper lobe with fluid tracking along the left pulmonary fissure. Moderate  residual left pleural effusion at the lung base, largely subpulmonic. 2. No change in bulky mediastinal adenopathy, which may be secondary to infection, inflammation, or neoplasm. Correlation with PET-CT or possible tissue sampling previously recommended. 3. Patchy consolidation in the lingula and left lower lobe may reflect atelectasis or pneumonia 4. Moderate severe compression fracture of L1, uncertain chronicity, with 7 mm of retropulsion. Aortic Atherosclerosis (ICD10-I70.0) and Emphysema (ICD10-J43.9). Electronically Signed   By: Donavan Foil M.D.   On: 02/12/2017 20:15   Ct Chest Wo Contrast  Result Date: 02/09/2017 CLINICAL DATA:  Evaluate pleural effusion EXAM: CT CHEST WITHOUT CONTRAST TECHNIQUE: Multidetector CT imaging of the chest was performed following the standard protocol without IV contrast. COMPARISON:  Earlier same day FINDINGS: Cardiovascular: Marked cardiomegaly. No pericardial effusion. Left anterior chest wall pacer leads terminate within the right atrium and ventricle. Post coronary artery stent placement. Mild ectasia of the ascending thoracic aorta measuring 30 mm in  diameter. The main pulmonary artery is enlarged measuring 3.5 mm in diameter. Mediastinum/Nodes: Bulky mediastinal adenopathy with index right paratracheal lymph node measuring 3.5 cm in greatest short axis diameter (image 30, series 3) and index precarinal lymph node measuring 3 cm. Suspected right hilar adenopathy though this is suboptimally evaluated on this noncontrast examination. No axillary adenopathy. Lungs/Pleura: Interval reduction in persistent small left-sided pleural effusion post thoracentesis. No pneumothorax. A small amount of fluid is again seen tracking within the left major fissure. Mild apical predominant centrilobular emphysematous change. No pneumothorax. Improved aeration of the left lung with persistent perihilar ground-glass opacities and left basilar consolidative opacities with associated air  bronchograms. Minimal right basilar heterogeneous opacities favored to represent atelectasis. Punctate (approximately 2 mm) calcified granuloma within the left upper lobe (image 16, series 4). No discrete pulmonary nodules or masses given limitation of the examination. Upper Abdomen: Limited noncontrast evaluation of the upper abdomen is unremarkable. Musculoskeletal: No acute or aggressive osseous abnormalities. IMPRESSION: 1. Interval reduction in persistent small left-sided effusion post thoracentesis. No pneumothorax. 2. Improved aeration of the left lung with persistent left perihilar and basilar opacities, nonspecific with differential considerations including asymmetric pulmonary edema versus infection (including atypical etiologies). 3. Bulky mediastinal adenopathy, the etiology of which is not depicted on this examination, specifically, there is no discrete pulmonary nodule or mass identified on this noncontrast examination. Further evaluation with contrast-enhanced CT of the chest, abdomen and pelvis versus outpatient PET-CT could be performed as clinically indicated. Alternatively, bronchoscopic biopsy could be performed as indicated. 4. Cardiomegaly. 5. Aortic Atherosclerosis (ICD10-I70.0) and Emphysema (ICD10-J43.9). Electronically Signed   By: Sandi Mariscal M.D.   On: 02/09/2017 17:25   Dg Chest Port 1 View  Result Date: 02/12/2017 CLINICAL DATA:  Shortness of breath, 4 days duration.  Follow-up. EXAM: PORTABLE CHEST 1 VIEW COMPARISON:  02/10/2017 FINDINGS: Pacemaker/AID appears unchanged. Cardiomegaly persists. Persistent small to moderate size left effusion with left base atelectasis. Venous hypertension with early interstitial edema. IMPRESSION: Similar appearance to the study of 2 days ago. Persistent small to moderate size left effusion with left base atelectasis. Mild interstitial edema. Electronically Signed   By: Nelson Chimes M.D.   On: 02/12/2017 10:10   Dg Chest Port 1 View  Result  Date: 02/10/2017 CLINICAL DATA:  Recent thoracentesis, cough, shortness of breath, CHF EXAM: PORTABLE CHEST 1 VIEW COMPARISON:  02/09/2017 FINDINGS: Patient is status post left thoracentesis. No pneumothorax. Left basilar partial collapse/ consolidation persist. Stable cardiomegaly with central vascular congestion. Right lung remains clear. Left subclavian pacer noted. Trachea is midline. IMPRESSION: Negative for pneumothorax following thoracentesis. Persistent left lower lobe partial collapse / consolidation. Electronically Signed   By: Jerilynn Mages.  Shick M.D.   On: 02/10/2017 07:56   Dg Chest Port 1 View  Result Date: 02/09/2017 CLINICAL DATA:  Thoracentesis EXAM: PORTABLE CHEST 1 VIEW COMPARISON:  02/09/2017 FINDINGS: Substantial reduction of left pleural fluid volume. No pneumothorax. Mild residual left base opacity likely represents a combination of consolidation and residual pleural fluid. The right lung is clear. Moderate cardiomegaly persists. Grossly intact appearances of the transvenous cardiac leads. IMPRESSION: Substantial reduction of pleural fluid volume after left thoracentesis. No pneumothorax. Electronically Signed   By: Andreas Newport M.D.   On: 02/09/2017 03:11   Dg Chest Port 1 View  Result Date: 02/09/2017 CLINICAL DATA:  61 y/o  M; shortness of breath. EXAM: PORTABLE CHEST 1 VIEW COMPARISON:  None. FINDINGS: Near complete opacification of the left lung probably representing a large effusion lung  consolidation. Grossly clear right lung. The cardiac silhouette is largely obscured by opacification of the left hemithorax. Partially visualized 2 lead pacemaker. The patient's chin obscures the lung apices. No acute osseous abnormality is evident. IMPRESSION: Complete opacification of the left lung probably representing large effusion and lung consolidation. Electronically Signed   By: Kristine Garbe M.D.   On: 02/09/2017 01:22    Time Spent in minutes  25   Louellen Molder M.D on  02/15/2017 at 1:33 PM  Between 7am to 7pm - Pager - [phone]  After 7pm go to [email or web address] - password [facility]  [facility] -  Office  [phone]

## 2017-02-15 NOTE — Progress Notes (Signed)
Physical Therapy Treatment Patient Details Name: Stephen Cardenas MRN: [id number] DOB: Jul 04, 1956 Today's Date: 02/15/2017    History of Present Illness 61 year old male with chronic systolic CHF (EF of 91%), CAD with history of MI, AICD, COPD, OSA on CPAP, hypertension, hypothyroidism, recently treated for HCT AP and multiple recent hospitalization who was discharged from [facility] 3 weeks back and then subsequently admitted to [facility], discharged to rehabilitation from there to rehabilitation with failure to thrive presented to the ED with increasing shortness of breath. EMS was called and patient taken to [facility] and placed on BiPAP. Chest x-ray showed large lateral left pleural effusion. He received IV Zosyn and Levaquin in the hospital. CT chest was unable to be obtained since patient unable to lie down flat. He was then referred to critical care at [facility] and transferred here for further management. Patient admitted for sepsis possibly secondary to left parapneumonic effusion.    PT Comments    Pt is making good progress towards his goals. Pt physician present at beginning of session and encouraged pt to get as strong as possible with walking in case there was a need for heart surgery. Pt willing to push himself with PT today and able to ambulate 500 feet with one standing rest break and min guard for safety. Pt to ambulate with ambulation team as well. Pt requires skilled PT to progress ambulation and to improve LE strength and endurance to be as fit as possible to prepare for surgery or safe ambulation in his discharge environment.    Follow Up Recommendations  SNF;Supervision/Assistance - 24 hour (If pt declines SNF, HHPT recommended)     Equipment Recommendations  3in1 (PT) (home O2)    Recommendations for Other Services       Precautions / Restrictions Precautions Precautions: Fall Restrictions Weight Bearing Restrictions: No     Mobility  Bed Mobility Overal bed mobility: Independent                Transfers Overall transfer level: Needs assistance Equipment used: None Transfers: Sit to/from Stand Sit to Stand: Supervision         General transfer comment: supervision for safety  Ambulation/Gait Ambulation/Gait assistance: Min guard Ambulation Distance (Feet): 500 Feet Assistive device: None Gait Pattern/deviations: Step-through pattern;Decreased stride length;Trunk flexed     General Gait Details: ambulates with mingaurd assist for safety required 1 standing rest break at 250 feet before returning to his room      Balance Overall balance assessment: Modified Independent         Standing balance support: No upper extremity supported Standing balance-Leahy Scale: Fair Standing balance comment: stands statically without UE support                            Cognition Arousal/Alertness: Awake/alert Behavior During Therapy: WFL for tasks assessed/performed Overall Cognitive Status: Within Functional Limits for tasks assessed                                           General Comments General comments (skin integrity, edema, etc.): at rest HR 82 bpm, SaO2 on 2 L O2 via nasal cannula 96%, during ambulation HR 118 bpm and SaO2 91%O2, after ambualtion HR 111 bpm , SaO2 96% O2      Pertinent Vitals/Pain Pain Assessment: No/denies pain  PT Goals (current goals can now be found in the care plan section) Acute Rehab PT Goals PT Goal Formulation: With patient Time For Goal Achievement: 02/25/17 Potential to Achieve Goals: Good Progress towards PT goals: Progressing toward goals    Frequency    Min 3X/week      PT Plan Current plan remains appropriate       AM-PAC PT "6 Clicks" Daily Activity  Outcome Measure  Difficulty turning over in bed (including adjusting bedclothes, sheets and blankets)?: None Difficulty moving from lying on  back to sitting on the side of the bed? : None Difficulty sitting down on and standing up from a chair with arms (e.g., wheelchair, bedside commode, etc,.)?: None Help needed moving to and from a bed to chair (including a wheelchair)?: A Little Help needed walking in hospital room?: A Little Help needed climbing 3-5 steps with a railing? : A Little 6 Click Score: 21    End of Session Equipment Utilized During Treatment: Gait belt;Oxygen Activity Tolerance: Patient tolerated treatment well Patient left: in bed;with call bell/phone within reach Nurse Communication: Mobility status PT Visit Diagnosis: Muscle weakness (generalized) (M62.81);Unsteadiness on feet (R26.81)     Time: 1330-1350 PT Time Calculation (min) (ACUTE ONLY): 20 min  Charges:  $Gait Training: 8-22 mins                    G Codes:       Analea Muller B. Migdalia Dk PT, DPT Acute Rehabilitation  [phone] Pager [phone]     [place] 02/15/2017, 2:47 PM

## 2017-02-15 NOTE — Progress Notes (Signed)
Advanced Heart Failure Rounding Note  Primary Cardiologist: New  Subjective:    L/RHC yesterday with severe 3 v CAD with multiple areas of in-stent re-stenosis, severe iCM with EF 15% in setting of previous large anterior MI, and relatively well-compensated hemodynamics after diuresis. Full report below.   Breathing "much better" Denies DOE or orthopnea. No CP.   Out 400 cc. Weight shows up 5 lbs. ? Accuracy. Weight down at least 10 lbs from highest weight this admission.   Creatinine 0.92 -> 1.20  [facility] 02/14/17  Mid RCA-2 lesion, 80 %stenosed.  Mid RCA-1 lesion, 90 %stenosed.  Prox RCA-2 lesion, 50 %stenosed.  Prox RCA-1 lesion, 30 %stenosed.  Dist RCA lesion, 30 %stenosed.  2nd Mrg lesion, 95 %stenosed.  Mid LAD to Dist LAD lesion, 95 %stenosed.  Dist LAD lesion, 30 %stenosed.  2nd Diag lesion, 99 %stenosed.  There is severe left ventricular systolic dysfunction.   Findings:  Ao = 108/78 (91) LV = 108/20 RA =  6 RV = 54/9 PA = 57/33 (41) PCW = 19 Fick cardiac output/index = 5.9/3.1 PVR = 3.7 WU SVR = 1150  Ao sat = 94% PA sat = 58%, 60%  Assessment: 1. Severe 3 v CAD with multiple areas of in-stent restenosis 2. Severe iCM with EF 15% in setting of previous large anterior MI 3. Relatively well-compensated hemodynamics after diuresis  Objective:   Weight Range: 162 lb 3.2 oz (73.6 kg) Body mass index is 23.27 kg/m.   Vital Signs:   Temp:  [97.9 F (36.6 C)-98.3 F (36.8 C)] 97.9 F (36.6 C) (07/03 0448) Pulse Rate:  [84-98] 92 (07/03 0448) Resp:  [18] 18 (07/03 0448) BP: (97-128)/(55-89) 111/82 (07/03 0448) SpO2:  [96 %-99 %] 99 % (07/03 0752) Weight:  [162 lb 3.2 oz (73.6 kg)] 162 lb 3.2 oz (73.6 kg) (07/03 0448) Last BM Date: 02/13/17  Weight change: Filed Weights   02/13/17 0432 02/14/17 0513 02/15/17 0448  Weight: 165 lb 3.2 oz (74.9 kg) 157 lb 9.6 oz (71.5 kg) 162 lb 3.2 oz (73.6 kg)    Intake/Output:   Intake/Output  Summary (Last 24 hours) at 02/15/17 0959 Last data filed at 02/15/17 0853  Gross per 24 hour  Intake             1015 ml  Output             1075 ml  Net              -60 ml      Physical Exam    General: Elderly appearing. No resp difficulty. HEENT: Normal Neck: Supple. JVP 6-7. Carotids 2+ bilat; no bruits. No thyromegaly or nodule noted. Cor: PMI nondisplaced. RRR, +s3 Lungs: CTAB, Diminished left basilar sound. Abdomen: Soft, non-tender, non-distended, no HSM. No bruits or masses. +BS  Extremities: No cyanosis, clubbing, rash, R and LLE no edema.  Neuro: Alert & orientedx3, cranial nerves grossly intact. moves all 4 extremities w/o difficulty. Affect pleasant   Telemetry   Personally reviewed, NSR 70s   EKG    N/A  Labs    CBC  Recent Labs  02/14/17 0612  WBC 8.8  HGB 9.6*  HCT 31.8*  MCV 86.6  PLT 213   Basic Metabolic Panel  Recent Labs  02/14/17 0612 02/15/17 0430  NA 133* 134*  K 3.8 4.3  CL 98* 101  CO2 28 28  GLUCOSE 89 103*  BUN 25* 22*  CREATININE 0.92 1.20  CALCIUM 8.5*  8.4*   Liver Function Tests No results for input(s): AST, ALT, ALKPHOS, BILITOT, PROT, ALBUMIN in the last 72 hours. No results for input(s): LIPASE, AMYLASE in the last 72 hours. Cardiac Enzymes  Recent Labs  02/12/17 1221  TROPONINI 0.09*    BNP: BNP (last 3 results)  Recent Labs  02/12/17 1221  BNP >4,500.0*    ProBNP (last 3 results) No results for input(s): PROBNP in the last 8760 hours.   D-Dimer No results for input(s): DDIMER in the last 72 hours. Hemoglobin A1C No results for input(s): HGBA1C in the last 72 hours. Fasting Lipid Panel No results for input(s): CHOL, HDL, LDLCALC, TRIG, CHOLHDL, LDLDIRECT in the last 72 hours. Thyroid Function Tests No results for input(s): TSH, T4TOTAL, T3FREE, THYROIDAB in the last 72 hours.  Invalid input(s): FREET3  Other results:   Imaging    No results found.   Medications:     Scheduled  Medications: . arformoterol  15 mcg Nebulization BID  . aspirin EC  81 mg Oral Daily  . budesonide (PULMICORT) nebulizer solution  0.5 mg Nebulization BID  . cholecalciferol  2,000 Units Oral Daily  . digoxin  0.125 mg Oral Daily  . docusate sodium  100 mg Oral Daily  . DULoxetine  60 mg Oral Daily  . enoxaparin (LOVENOX) injection  40 mg Subcutaneous Q24H  . ferrous sulfate  325 mg Oral BID WC  . furosemide  80 mg Oral BID  . insulin aspart  0-5 Units Subcutaneous QHS  . insulin aspart  0-9 Units Subcutaneous TID WC  . ipratropium-albuterol  3 mL Nebulization BID  . levofloxacin  750 mg Oral Daily  . levothyroxine  150 mcg Oral QAC breakfast  . losartan  25 mg Oral Daily  . pantoprazole (PROTONIX) IV  40 mg Intravenous QHS  . rosuvastatin  20 mg Oral q1800  . sodium chloride flush  3 mL Intravenous Q12H  . spironolactone  12.5 mg Oral Daily  . ticagrelor  90 mg Oral BID  . vitamin B-12  2,000 mcg Oral Daily    Infusions: . sodium chloride      PRN Medications: sodium chloride, acetaminophen, albuterol, ALPRAZolam, ondansetron (ZOFRAN) IV, sodium chloride flush    Patient Profile   60yoM with h/o chronic Systolic CHF (EF 02%), CAD s/p MI, ICD, COPD, OSA on CPAP, HTN, Hypothyroidism. Multiple recent hospitalizations admitted with recurrent respiratory failure and large left pleural effusion. Now s/p thoracentesis (transudative)   Assessment/Plan   1. Acute on chronic hypoxic respiratory failure with large left pleural effusion - Much improved after thoracentesis.  - Per report report tap was bloody but effusion is transudative by Light's criteria. Cytology only with mesothelial cells commonly seen in HF. Dr. Haroldine Laws reviewed with Dr. Titus Mould  - Repeat CT 6/30 showed moderate residual effusion.  - If recurs may need Pleurex - Desats with exertion. Will likely need home O2. - Continue pulmonary toilet.  - Being treated for levaquin x 8 days for possible PNA per  primary.  2. Large left pleural effusion - s/p thoracentesis -> transudative by Light's critreia - See above.  3. Acute on chronic systolic HF due to iCM - EF 15-20% by echo 6/30. Moderate RV dysfunction. Viewed personally - Chronic NYHA IIIb-IV. Suspect low output - RHC yesterday with compensated hemodynamics after diuresis.  - Creatinine mildly elevated 0.92 -> 1.20. Received lasix 80 mg this am per primary.  No further lasix today.  Will not order standing lasix for tomorrow,  as will need to follow creatinine.  Suspect will only need lasix 40 mg daily to 40 mg BID. (Pt only taking 20 mg BID on admission) - Continue spiro 12.5 mg daily - Continue digoxin 0.125 mg daily - Continue losartan 25 mg daily.  -Given comorbidities would be marginal candidate for advanced therapies at best. Though patient may benefit from a trial of inotropes,if needed, to help him regain stamina for further consideration.   4. CAD s/p previous MI - Stable. No s/s ishchemia. Continue ASA and Brilinta. Crestor started 02/13/17 - Cath yesterday with severe 3 v CAD. Dr. Haroldine Laws to discuss with interventional team.   5. COPD - Stable. Per primary.   6. Mediastinal lymphadenopathy on chest CT - ? Related to HF or other underlying process. LDH ok. Consider outpatient PET. No change.   7. Failure to thrive/severe deconditioning - PT recommending SNF. No change.   8. Hypokalemia - K 4.3. Continue to follow.   Length of Stay: [place], [place]  02/15/2017, 9:59 AM  Advanced Heart Failure Team Pager [phone] (M-F; 7a - 4p)  Please contact [facility] for night-coverage after hours (4p -7a ) and weekends on [email or web address]  Patient seen and examined with the above-signed Advanced Practice Provider and/or Housestaff. I personally reviewed laboratory data, imaging studies and relevant notes. I independently examined the patient and formulated the important aspects of the plan. I have edited the  note to reflect any of my changes or salient points. I have personally discussed the plan with the patient and/or family.  Volume status looks good today. Creatinine stable.   Cath results reviewed with him and Dr. Burt Knack. May be candidate for PCI of RCA and LCX (anterior wall likely scarred). He is much more functional today. Will have Dr. PVT evaluate for possible CABG (versus VAD).   Glori Bickers, MD  5:00 PM

## 2017-02-15 NOTE — Progress Notes (Signed)
Patient refused CPAP, no CPAP in room.

## 2017-02-16 DIAGNOSIS — Z9889 Other specified postprocedural states: Secondary | ICD-10-CM

## 2017-02-16 LAB — GLUCOSE, CAPILLARY
GLUCOSE-CAPILLARY: 117 mg/dL — AB (ref 65–99)
GLUCOSE-CAPILLARY: 102 mg/dL — AB (ref 65–99)
Glucose-Capillary: 108 mg/dL — ABNORMAL HIGH (ref 65–99)
Glucose-Capillary: 107 mg/dL — ABNORMAL HIGH (ref 65–99)

## 2017-02-16 LAB — BASIC METABOLIC PANEL
ANION GAP: 8 (ref 5–15)
BUN: 18 mg/dL (ref 6–20)
CALCIUM: 8.7 mg/dL — AB (ref 8.9–10.3)
CHLORIDE: 99 mmol/L — AB (ref 101–111)
CO2: 28 mmol/L (ref 22–32)
CREATININE: 1.14 mg/dL (ref 0.61–1.24)
GFR calc Af Amer: >60 mL/min (ref >60)
GFR calc non Af Amer: >60 mL/min (ref >60)
GLUCOSE: 86 mg/dL (ref 65–99)
Potassium: 4.2 mmol/L (ref 3.5–5.1)
Sodium: 135 mmol/L (ref 135–145)

## 2017-02-16 MED ORDER — PANTOPRAZOLE SODIUM 40 MG PO TBEC
40.0000 mg | DELAYED_RELEASE_TABLET | Freq: Every day | ORAL | Status: DC
  Administered 2017-02-16 – 2017-02-22 (×7): 40 mg via ORAL
  Filled 2017-02-16 (×7): qty 1

## 2017-02-16 MED ORDER — CARVEDILOL 3.125 MG PO TABS
3.1250 mg | ORAL_TABLET | Freq: Two times a day (BID) | ORAL | Status: DC
  Administered 2017-02-16 – 2017-02-17 (×3): 3.125 mg via ORAL
  Filled 2017-02-16 (×3): qty 1

## 2017-02-16 NOTE — Progress Notes (Signed)
PHARMACIST - PHYSICIAN COMMUNICATION DR:   Hosie Poisson CONCERNING: Antibiotic IV to Oral Route Change Policy  RECOMMENDATION: This patient is receiving protonix by the intravenous route.  Based on criteria approved by the Pharmacy and Therapeutics Committee, the antibiotic(s) is/are being converted to the equivalent oral dose form(s).   DESCRIPTION: These criteria include:  Patient being treated for a respiratory tract infection, urinary tract infection, cellulitis or clostridium difficile associated diarrhea if on metronidazole  The patient is not neutropenic and does not exhibit a GI malabsorption state  The patient is eating (either orally or via tube) and/or has been taking other orally administered medications for a least 24 hours  The patient is improving clinically and has a Tmax < 100.5  If you have questions about this conversion, please contact the Pharmacy Department  []   [phone] )  Stephen Cardenas   Stark Klein, PharmD Clinical Pharmacy Resident [phone] (Pager) 02/16/2017 2:34 PM

## 2017-02-16 NOTE — Progress Notes (Signed)
PROGRESS NOTE    Stephen Cardenas  UXN:[id number] DOB: 05/31/1956 DOA: 02/08/2017 PCP: System, Pcp Not In    Brief Narrative: 61 year old with severe vessel CAD, severe ischemic cardiomyopathy, with LVEF of 15%, hypertension, hypothyroidism, OSA on CPAP, COPD, s/p AICD,  presented with DOE, sepsis, with para pneumonic effusion.   Assessment & Plan:   Active Problems:   Acute respiratory failure with hypoxia (HCC)   Ventilator dependent (HCC)   Shortness of breath   SOB (shortness of breath)   Pleural effusion   Acute on chronic systolic (congestive) heart failure (HCC)   CAD in native artery   Acute respiratory failure with hypoxia, secondary to acute on chronic systolic CHF, left pleural effusion, parapneumonia effusion, .  Diuresis with IV lasix and his breathing has improved.  Cardiac cath showed show three vessel disease and multiple areas of in stent restenosis.  Will need further work up with 24 hour thallium, if anterior wall viable with possibly consider CABG.  Resume aspirin, statin and Brillinta.  S/p thoracentesis. Cytology showing mesothelial cells. Completed the course of antibiotics. Urine for strep and legionella antigen negative.  Repeat CXR in am,   Elevated troponins: Demand ischemia with CHF.    COPD:  No wheezing heard, resume pulmicort and brovana.  Continue with duoneb.    AKI; resolved.   Anemia:  Normocytic.  Hemoglobin stable at 9.6.    Hypertension:  Controlled.  Resume cozaar.    Hyperlipidemia:  Resume Crestor.    Hypothyroidism: Resume synthroid.      DVT prophylaxis: lovenox.  Code Status: (Full Family Communication: none at bedside.  Disposition Plan: now agreeable to SNF.    Consultants:   HF team.    Procedures:cardiac cath.    Antimicrobials: completed the course of antibiotics.    Subjective: Breathing is better.  Chest pain none.  No nausea, vomiting.  No abd pain.    Objective: Vitals:   02/16/17  0605 02/16/17 0651 02/16/17 0844 02/16/17 1230  BP: 102/71   100/74  Pulse: (!) 102   (!) 102  Resp:    18  Temp: (!) 97.2 F (36.2 C) 97.8 F (36.6 C)  98 F (36.7 C)  TempSrc: Oral Oral  Oral  SpO2: 99%  98% 98%  Weight: 71.7 kg (158 lb)     Height:        Intake/Output Summary (Last 24 hours) at 02/16/17 1935 Last data filed at 02/16/17 1837  Gross per 24 hour  Intake              830 ml  Output             3405 ml  Net            -2575 ml   Filed Weights   02/14/17 0513 02/15/17 0448 02/16/17 0605  Weight: 71.5 kg (157 lb 9.6 oz) 73.6 kg (162 lb 3.2 oz) 71.7 kg (158 lb)    Examination:  General exam: Appears calm and comfortable  Respiratory system: decrased breath sounds at bases. No wheezing.  Cardiovascular system: S1 & S2 heard, RRR. No JVD, murmurs, rubs, gallops or clicks.  Gastrointestinal system: Abdomen is nondistended, soft and nontender. No organomegaly or masses felt. Normal bowel sounds heard. Central nervous system: Alert and oriented. No focal neurological deficits. Extremities: Symmetric 5 x 5 power. Skin: No rashes, lesions or ulcers Psychiatry: Judgement and insight appear normal. Mood & affect appropriate.     Data Reviewed: I have personally  reviewed following labs and imaging studies  CBC:  Recent Labs Lab 02/09/17 2227 02/10/17 0320 02/11/17 0340 02/14/17 0612  WBC  --  15.4* 11.9* 8.8  HGB  --  8.7* 9.0* 9.6*  HCT 28.9* 28.4* 30.1* 31.8*  MCV  --  86.3 89.1 86.6  PLT  --  228 250 381   Basic Metabolic Panel:  Recent Labs Lab 02/11/17 0340 02/12/17 0456 02/13/17 0417 02/14/17 0612 02/15/17 0430 02/16/17 0222  NA 134* 132* 131* 133* 134* 135  K 4.5 4.3 3.5 3.8 4.3 4.2  CL 101 100* 97* 98* 101 99*  CO2 25 25 26 28 28 28   GLUCOSE 92 112* 137* 89 103* 86  BUN 22* 27* 29* 25* 22* 18  CREATININE 0.99 1.00 0.97 0.92 1.20 1.14  CALCIUM 8.5* 8.7* 8.3* 8.5* 8.4* 8.7*  MG 2.0  --   --   --   --   --    GFR: Estimated  Creatinine Clearance: 68.1 mL/min (by C-G formula based on SCr of 1.14 mg/dL). Liver Function Tests: No results for input(s): AST, ALT, ALKPHOS, BILITOT, PROT, ALBUMIN in the last 168 hours. No results for input(s): LIPASE, AMYLASE in the last 168 hours. No results for input(s): AMMONIA in the last 168 hours. Coagulation Profile:  Recent Labs Lab 02/14/17 0612  INR 1.29   Cardiac Enzymes:  Recent Labs Lab 02/12/17 0012 02/12/17 0456 02/12/17 1221  TROPONINI 0.10* 0.10* 0.09*   BNP (last 3 results) No results for input(s): PROBNP in the last 8760 hours. HbA1C: No results for input(s): HGBA1C in the last 72 hours. CBG:  Recent Labs Lab 02/15/17 1647 02/15/17 2109 02/16/17 0801 02/16/17 1219 02/16/17 1609  GLUCAP 117* 134* 108* 107* 117*   Lipid Profile: No results for input(s): CHOL, HDL, LDLCALC, TRIG, CHOLHDL, LDLDIRECT in the last 72 hours. Thyroid Function Tests: No results for input(s): TSH, T4TOTAL, FREET4, T3FREE, THYROIDAB in the last 72 hours. Anemia Panel: No results for input(s): VITAMINB12, FOLATE, FERRITIN, TIBC, IRON, RETICCTPCT in the last 72 hours. Sepsis Labs:  Recent Labs Lab 02/10/17 0754  LATICACIDVEN 1.5    Recent Results (from the past 240 hour(s))  MRSA PCR Screening     Status: Abnormal   Collection Time: 02/08/17 11:49 PM  Result Value Ref Range Status   MRSA by PCR POSITIVE (A) NEGATIVE Final    Comment:        The GeneXpert MRSA Assay (FDA approved for NASAL specimens only), is one component of a comprehensive MRSA colonization surveillance program. It is not intended to diagnose MRSA infection nor to guide or monitor treatment for MRSA infections. RESULT CALLED TO, READ BACK BY AND VERIFIED WITH: C.EUBANKS,RN AT 0213 BY L.PITT 02/09/17   Body fluid culture (includes gram stain)     Status: None   Collection Time: 02/09/17  2:20 AM  Result Value Ref Range Status   Specimen Description FLUID PLEURAL  Final   Special  Requests NONE  Final   Gram Stain   Final    FEW WBC PRESENT, PREDOMINANTLY MONONUCLEAR NO ORGANISMS SEEN    Culture NO GROWTH 3 DAYS  Final   Report Status 02/12/2017 FINAL  Final         Radiology Studies: No results found.      Scheduled Meds: . arformoterol  15 mcg Nebulization BID  . aspirin EC  81 mg Oral Daily  . budesonide (PULMICORT) nebulizer solution  0.5 mg Nebulization BID  . carvedilol  3.125 mg Oral  BID WC  . cholecalciferol  2,000 Units Oral Daily  . digoxin  0.125 mg Oral Daily  . docusate sodium  100 mg Oral Daily  . DULoxetine  60 mg Oral Daily  . enoxaparin (LOVENOX) injection  40 mg Subcutaneous Q24H  . ferrous sulfate  325 mg Oral BID WC  . insulin aspart  0-5 Units Subcutaneous QHS  . insulin aspart  0-9 Units Subcutaneous TID WC  . ipratropium-albuterol  3 mL Nebulization BID  . levofloxacin  750 mg Oral Daily  . levothyroxine  150 mcg Oral QAC breakfast  . losartan  25 mg Oral Daily  . pantoprazole  40 mg Oral QHS  . rosuvastatin  20 mg Oral q1800  . sodium chloride flush  3 mL Intravenous Q12H  . spironolactone  12.5 mg Oral Daily  . ticagrelor  90 mg Oral BID  . vitamin B-12  2,000 mcg Oral Daily   Continuous Infusions: . sodium chloride       LOS: 8 days    Time spent: 47 min    Makale Pindell, MD [facility] Pager [phone]  If 7PM-7AM, please contact night-coverage [email or web address] Password [facility] 02/16/2017, 7:35 PM

## 2017-02-16 NOTE — Progress Notes (Signed)
Advanced Heart Failure Rounding Note  Primary Cardiologist: New  Subjective:    L/RHC y7/1 with severe 3 v CAD with multiple areas of in-stent re-stenosis, severe iCM with EF 15% in setting of previous large anterior MI, and relatively well-compensated hemodynamics after diuresis. Full report below.   Denies CP or SOB. Weight stable. Creatinine improved.  Has walked halls with PT.  SBP 100-115  L/RHC 02/14/17  Mid RCA-2 lesion, 80 %stenosed.  Mid RCA-1 lesion, 90 %stenosed.  Prox RCA-2 lesion, 50 %stenosed.  Prox RCA-1 lesion, 30 %stenosed.  Dist RCA lesion, 30 %stenosed.  2nd Mrg lesion, 95 %stenosed.  Mid LAD to Dist LAD lesion, 95 %stenosed.  Dist LAD lesion, 30 %stenosed.  2nd Diag lesion, 99 %stenosed.  There is severe left ventricular systolic dysfunction.   Findings:  Ao = 108/78 (91) LV = 108/20 RA =  6 RV = 54/9 PA = 57/33 (41) PCW = 19 Fick cardiac output/index = 5.9/3.1 PVR = 3.7 WU SVR = 1150  Ao sat = 94% PA sat = 58%, 60%  Assessment: 1. Severe 3 v CAD with multiple areas of in-stent restenosis 2. Severe iCM with EF 15% in setting of previous large anterior MI 3. Relatively well-compensated hemodynamics after diuresis  Objective:   Weight Range: 71.7 kg (158 lb) Body mass index is 22.67 kg/m.   Vital Signs:   Temp:  [97.2 F (36.2 C)-97.8 F (36.6 C)] 97.8 F (36.6 C) (07/04 0651) Pulse Rate:  [99-102] 102 (07/04 0605) Resp:  [18] 18 (07/03 2112) BP: (102-114)/(69-81) 102/71 (07/04 0605) SpO2:  [96 %-99 %] 98 % (07/04 0844) Weight:  [71.7 kg (158 lb)] 71.7 kg (158 lb) (07/04 0605) Last BM Date: 02/13/17  Weight change: Filed Weights   02/14/17 0513 02/15/17 0448 02/16/17 0605  Weight: 71.5 kg (157 lb 9.6 oz) 73.6 kg (162 lb 3.2 oz) 71.7 kg (158 lb)    Intake/Output:   Intake/Output Summary (Last 24 hours) at 02/16/17 1051 Last data filed at 02/16/17 0915  Gross per 24 hour  Intake              415 ml  Output              2780 ml  Net            -2365 ml      Physical Exam    General:  Lying in bed . No resp difficulty HEENT: normal Neck: supple. no JVD. Carotids 2+ bilat; no bruits. No lymphadenopathy or thryomegaly appreciated. Cor: PMI laterally displaced. Regular rate & rhythm. No rubs, gallops or murmurs. Lungs: clear decreased in bases  Abdomen: soft, nontender, nondistended. No hepatosplenomegaly. No bruits or masses. Good bowel sounds. Extremities: no cyanosis, clubbing, rash, edema Neuro: alert & orientedx3, cranial nerves grossly intact. moves all 4 extremities w/o difficulty. Affect pleasant   Telemetry   Sinus 90-100. Personally reviewed   EKG    N/A  Labs    CBC  Recent Labs  02/14/17 0612  WBC 8.8  HGB 9.6*  HCT 31.8*  MCV 86.6  PLT 976   Basic Metabolic Panel  Recent Labs  02/15/17 0430 02/16/17 0222  NA 134* 135  K 4.3 4.2  CL 101 99*  CO2 28 28  GLUCOSE 103* 86  BUN 22* 18  CREATININE 1.20 1.14  CALCIUM 8.4* 8.7*   Liver Function Tests No results for input(s): AST, ALT, ALKPHOS, BILITOT, PROT, ALBUMIN in the last 72 hours. No results  for input(s): LIPASE, AMYLASE in the last 72 hours. Cardiac Enzymes No results for input(s): CKTOTAL, CKMB, CKMBINDEX, TROPONINI in the last 72 hours.  BNP: BNP (last 3 results)  Recent Labs  02/12/17 1221  BNP >4,500.0*    ProBNP (last 3 results) No results for input(s): PROBNP in the last 8760 hours.   D-Dimer No results for input(s): DDIMER in the last 72 hours. Hemoglobin A1C No results for input(s): HGBA1C in the last 72 hours. Fasting Lipid Panel No results for input(s): CHOL, HDL, LDLCALC, TRIG, CHOLHDL, LDLDIRECT in the last 72 hours. Thyroid Function Tests No results for input(s): TSH, T4TOTAL, T3FREE, THYROIDAB in the last 72 hours.  Invalid input(s): FREET3  Other results:   Imaging    No results found.   Medications:     Scheduled Medications: . arformoterol  15 mcg  Nebulization BID  . aspirin EC  81 mg Oral Daily  . budesonide (PULMICORT) nebulizer solution  0.5 mg Nebulization BID  . cholecalciferol  2,000 Units Oral Daily  . digoxin  0.125 mg Oral Daily  . docusate sodium  100 mg Oral Daily  . DULoxetine  60 mg Oral Daily  . enoxaparin (LOVENOX) injection  40 mg Subcutaneous Q24H  . ferrous sulfate  325 mg Oral BID WC  . insulin aspart  0-5 Units Subcutaneous QHS  . insulin aspart  0-9 Units Subcutaneous TID WC  . ipratropium-albuterol  3 mL Nebulization BID  . levofloxacin  750 mg Oral Daily  . levothyroxine  150 mcg Oral QAC breakfast  . losartan  25 mg Oral Daily  . pantoprazole (PROTONIX) IV  40 mg Intravenous QHS  . rosuvastatin  20 mg Oral q1800  . sodium chloride flush  3 mL Intravenous Q12H  . spironolactone  12.5 mg Oral Daily  . ticagrelor  90 mg Oral BID  . vitamin B-12  2,000 mcg Oral Daily    Infusions: . sodium chloride      PRN Medications: sodium chloride, acetaminophen, albuterol, ALPRAZolam, ondansetron (ZOFRAN) IV, sodium chloride flush    Patient Profile   60yoM with h/o chronic Systolic CHF (EF 93%), CAD s/p MI, ICD, COPD, OSA on CPAP, HTN, Hypothyroidism. Multiple recent hospitalizations admitted with recurrent respiratory failure and large left pleural effusion. Now s/p thoracentesis (transudative)   Assessment/Plan   1. Acute on chronic systolic HF due to iCM - EF 15-20% by echo 6/30. Moderate RV dysfunction. Viewed personally - Chronic NYHA IIIb-IV.  - RHC 7/2with compensated hemodynamics after diuresis.  - Volume status stable off diuretics. Will hold for another day.  - Continue spiro 12.5 mg daily - Continue digoxin 0.125 mg daily - Continue losartan 25 mg daily. BP too low to switch to [employer] - Add back low dose carvedilol 3.125 bid  2. CAD s/p previous MI - Cath 7/1 with severe 3 v CAD with multiple areas of ISR - I have reviewed films with Dr. Burt Knack and Dr. Prescott Gum - I suspect anterior  wall is non-viable given previous LAD stent thrombosis.  - Will get 24-hour thallium (has ICD so no MRI). If anterior wall viable will consider CABG. If non-viable consider high-risk PCI of RCA and large OM-1 - Continue ASA, statin, Brillinta  3. Acute on chronic hypoxic respiratory failure with large left pleural effusion - Much improved after thoracentesis.  - Per report report tap was bloody but effusion is transudative by Light's criteria. Cytology only with mesothelial cells commonly seen in HF. Dr. Haroldine Laws reviewed with Dr.  Titus Mould  - Repeat CT 6/30 showed moderate residual effusion.  - If recurs may need Pleurex. Will repeat CXR in am  - Desats with exertion. Will likely need home O2. - Continue pulmonary toilet.  - Being treated for levaquin x 8 days for possible PNA per primary.  4. COPD - Stable. Per primary.   5. Mediastinal lymphadenopathy on chest CT - ? Related to HF or other underlying process. LDH ok. Consider outpatient PET. No change.   6. Failure to thrive/severe deconditioning - PT has seen. Functional capacity improving. Still recommending SNF  8. Hypokalemia - K 4.2. Continue to follow.   Length of Stay: 8  Glori Bickers, MD  02/16/2017, 10:51 AM  Advanced Heart Failure Team Pager [phone] (M-F; 7a - 4p)  Please contact [facility] for night-coverage after hours (4p -7a ) and weekends on [email or web address]

## 2017-02-17 ENCOUNTER — Inpatient Hospital Stay (HOSPITAL_COMMUNITY): Payer: Medicare Other

## 2017-02-17 DIAGNOSIS — I5023 Acute on chronic systolic (congestive) heart failure: Secondary | ICD-10-CM

## 2017-02-17 DIAGNOSIS — J90 Pleural effusion, not elsewhere classified: Secondary | ICD-10-CM

## 2017-02-17 LAB — BASIC METABOLIC PANEL
ANION GAP: 6 (ref 5–15)
BUN: 18 mg/dL (ref 6–20)
CO2: 28 mmol/L (ref 22–32)
Calcium: 8.6 mg/dL — ABNORMAL LOW (ref 8.9–10.3)
Chloride: 100 mmol/L — ABNORMAL LOW (ref 101–111)
Creatinine, Ser: 0.96 mg/dL (ref 0.61–1.24)
Glucose, Bld: 112 mg/dL — ABNORMAL HIGH (ref 65–99)
POTASSIUM: 4.3 mmol/L (ref 3.5–5.1)
Sodium: 134 mmol/L — ABNORMAL LOW (ref 135–145)

## 2017-02-17 LAB — GLUCOSE, CAPILLARY
GLUCOSE-CAPILLARY: 114 mg/dL — AB (ref 65–99)
Glucose-Capillary: 125 mg/dL — ABNORMAL HIGH (ref 65–99)
Glucose-Capillary: 98 mg/dL (ref 65–99)
Glucose-Capillary: 95 mg/dL (ref 65–99)

## 2017-02-17 MED ORDER — CARVEDILOL 3.125 MG PO TABS
3.1250 mg | ORAL_TABLET | Freq: Two times a day (BID) | ORAL | Status: DC
  Administered 2017-02-18 – 2017-02-23 (×10): 3.125 mg via ORAL
  Filled 2017-02-17 (×10): qty 1

## 2017-02-17 MED ORDER — INSULIN ASPART 100 UNIT/ML ~~LOC~~ SOLN
0.0000 [IU] | Freq: Three times a day (TID) | SUBCUTANEOUS | Status: DC
  Administered 2017-02-20 – 2017-02-22 (×2): 1 [IU] via SUBCUTANEOUS

## 2017-02-17 MED ORDER — FERROUS SULFATE 325 (65 FE) MG PO TABS
325.0000 mg | ORAL_TABLET | Freq: Two times a day (BID) | ORAL | Status: DC
  Administered 2017-02-18 – 2017-02-23 (×11): 325 mg via ORAL
  Filled 2017-02-17 (×11): qty 1

## 2017-02-17 MED ORDER — ASPIRIN EC 325 MG PO TBEC
325.0000 mg | DELAYED_RELEASE_TABLET | Freq: Every day | ORAL | Status: DC
  Administered 2017-02-18 – 2017-02-20 (×3): 325 mg via ORAL
  Filled 2017-02-17 (×3): qty 1

## 2017-02-17 MED ORDER — MUPIROCIN 2 % EX OINT
TOPICAL_OINTMENT | Freq: Two times a day (BID) | CUTANEOUS | Status: DC
  Administered 2017-02-17: 23:00:00 via NASAL
  Administered 2017-02-18 – 2017-02-19 (×3): 1 via NASAL
  Administered 2017-02-20: 22:00:00 via NASAL
  Administered 2017-02-20: 1 via NASAL
  Administered 2017-02-21: 22:00:00 via NASAL
  Administered 2017-02-21: 1 via NASAL
  Filled 2017-02-17 (×4): qty 22

## 2017-02-17 NOTE — Progress Notes (Signed)
Physical Therapy Treatment Patient Details Name: Stephen Cardenas MRN: [id number] DOB: April 24, 1956 Today's Date: 02/17/2017    History of Present Illness 61 year old male with chronic systolic CHF (EF of 32%), CAD with history of MI, AICD, COPD, OSA on CPAP, hypertension, hypothyroidism, recently treated for HCT AP and multiple recent hospitalization who was discharged from [facility] 3 weeks back and then subsequently admitted to [facility], discharged to rehabilitation from there to rehabilitation with failure to thrive presented to the ED with increasing shortness of breath. EMS was called and patient taken to [facility] and placed on BiPAP. Chest x-ray showed large lateral left pleural effusion. He received IV Zosyn and Levaquin in the hospital. CT chest was unable to be obtained since patient unable to lie down flat. He was then referred to critical care at [facility] and transferred here for further management. Patient admitted for sepsis possibly secondary to left parapneumonic effusion.    PT Comments    Pt is making good progress towards his goals. Pt is supervision for transfers and min guard for ambulation of 600 feet with no AD. PT requested to nursing that pt walk with staff in evening in order to continue to improve endurance to put pt in best possible position for any additional surgeries. Pt requires skilled PT to progress ambulation and to improve LE strength and endurance to safely mobilize in his discharge environment.    Follow Up Recommendations  SNF;Supervision/Assistance - 24 hour (If pt declines SNF, HHPT recommended)     Equipment Recommendations  3in1 (PT) (home O2)    Recommendations for Other Services       Precautions / Restrictions Precautions Precautions: Fall Restrictions Weight Bearing Restrictions: No    Mobility  Bed Mobility Overal bed mobility: Independent                Transfers Overall transfer level: Needs  assistance Equipment used: None Transfers: Sit to/from Stand Sit to Stand: Supervision         General transfer comment: supervision for safety  Ambulation/Gait Ambulation/Gait assistance: Min guard Ambulation Distance (Feet): 600 Feet Assistive device: None Gait Pattern/deviations: Step-through pattern;Decreased stride length;Trunk flexed Gait velocity: slowed Gait velocity interpretation: Below normal speed for age/gender General Gait Details: min guard for safety and management of O2 tank          Balance Overall balance assessment: Needs assistance Sitting-balance support: No upper extremity supported;Feet unsupported Sitting balance-Leahy Scale: Good     Standing balance support: No upper extremity supported Standing balance-Leahy Scale: Good Standing balance comment: stands statically without UE support                            Cognition Arousal/Alertness: Awake/alert Behavior During Therapy: WFL for tasks assessed/performed Overall Cognitive Status: Within Functional Limits for tasks assessed                                           General Comments General comments (skin integrity, edema, etc.): at rest HR 83 bpm and on 2L O2 via nasal cannula, SaO2 96%O2, after ambulation HR 108 bpm, SaO2 95%O2      Pertinent Vitals/Pain Pain Assessment: No/denies pain           PT Goals (current goals can now be found in the care plan section) Acute Rehab PT Goals  PT Goal Formulation: With patient Time For Goal Achievement: 02/25/17 Potential to Achieve Goals: Good Progress towards PT goals: Progressing toward goals    Frequency    Min 3X/week      PT Plan Current plan remains appropriate       AM-PAC PT "6 Clicks" Daily Activity  Outcome Measure  Difficulty turning over in bed (including adjusting bedclothes, sheets and blankets)?: None Difficulty moving from lying on back to sitting on the side of the bed? :  None Difficulty sitting down on and standing up from a chair with arms (e.g., wheelchair, bedside commode, etc,.)?: None Help needed moving to and from a bed to chair (including a wheelchair)?: None Help needed walking in hospital room?: None Help needed climbing 3-5 steps with a railing? : A Little 6 Click Score: 23    End of Session Equipment Utilized During Treatment: Gait belt;Oxygen Activity Tolerance: Patient tolerated treatment well Patient left: in bed;with call bell/phone within reach Nurse Communication: Mobility status PT Visit Diagnosis: Muscle weakness (generalized) (M62.81);Unsteadiness on feet (R26.81)     Time: 7871-8367 PT Time Calculation (min) (ACUTE ONLY): 25 min  Charges:  $Gait Training: 8-22 mins                    G Codes:       [name] B. Migdalia Dk PT, DPT Acute Rehabilitation  [phone] Pager [phone]     [place] 02/17/2017, 4:40 PM

## 2017-02-17 NOTE — Progress Notes (Signed)
Advanced Heart Failure Rounding Note  Primary Cardiologist: New  Subjective:    L/RHC y7/1 with severe 3 v CAD with multiple areas of in-stent re-stenosis, severe iCM with EF 15% in setting of previous large anterior MI, and relatively well-compensated hemodynamics after diuresis. Full report below.   Denies CP or SOB.  Weight unchanged. Walking halls without difficulty.   Creatinine stable at 0.96. K 4.3  [facility] 02/14/17  Mid RCA-2 lesion, 80 %stenosed.  Mid RCA-1 lesion, 90 %stenosed.  Prox RCA-2 lesion, 50 %stenosed.  Prox RCA-1 lesion, 30 %stenosed.  Dist RCA lesion, 30 %stenosed.  2nd Mrg lesion, 95 %stenosed.  Mid LAD to Dist LAD lesion, 95 %stenosed.  Dist LAD lesion, 30 %stenosed.  2nd Diag lesion, 99 %stenosed.  There is severe left ventricular systolic dysfunction.   Findings:  Ao = 108/78 (91) LV = 108/20 RA =  6 RV = 54/9 PA = 57/33 (41) PCW = 19 Fick cardiac output/index = 5.9/3.1 PVR = 3.7 WU SVR = 1150  Ao sat = 94% PA sat = 58%, 60%  Assessment: 1. Severe 3 v CAD with multiple areas of in-stent restenosis 2. Severe iCM with EF 15% in setting of previous large anterior MI 3. Relatively well-compensated hemodynamics after diuresis  Objective:   Weight Range: 158 lb 12.8 oz (72 kg) Body mass index is 22.79 kg/m.   Vital Signs:   Temp:  [98 F (36.7 C)-98.2 F (36.8 C)] 98.2 F (36.8 C) (07/05 0500) Pulse Rate:  [98-102] 98 (07/05 0500) Resp:  [18] 18 (07/05 0500) BP: (100-114)/(66-83) 114/66 (07/05 0500) SpO2:  [93 %-100 %] 100 % (07/05 0810) Weight:  [158 lb 12.8 oz (72 kg)] 158 lb 12.8 oz (72 kg) (07/05 0500) Last BM Date: 02/16/17  Weight change: Filed Weights   02/15/17 0448 02/16/17 0605 02/17/17 0500  Weight: 162 lb 3.2 oz (73.6 kg) 158 lb (71.7 kg) 158 lb 12.8 oz (72 kg)    Intake/Output:   Intake/Output Summary (Last 24 hours) at 02/17/17 0952 Last data filed at 02/17/17 0549  Gross per 24 hour  Intake               655 ml  Output             2100 ml  Net            -1445 ml      Physical Exam    General: Lying in bed. NAD  HEENT: Normal Neck: Supple. JVP 6-7. Carotids 2+ bilat; no bruits. No thyromegaly or nodule noted. Cor: PMI nondisplaced. RRR, No M/G/R noted Lungs: CTAB, normal effort. Abdomen: Soft, non-tender, non-distended, no HSM. No bruits or masses. +BS  Extremities: No cyanosis, clubbing, rash, R and LLE no edema.  Neuro: Alert & orientedx3, cranial nerves grossly intact. moves all 4 extremities w/o difficulty. Affect pleasant   Telemetry   Personally reviewed, Sinus 90-100    EKG    N/A  Labs    CBC No results for input(s): WBC, NEUTROABS, HGB, HCT, MCV, PLT in the last 72 hours. Basic Metabolic Panel  Recent Labs  02/16/17 0222 02/17/17 0401  NA 135 134*  K 4.2 4.3  CL 99* 100*  CO2 28 28  GLUCOSE 86 112*  BUN 18 18  CREATININE 1.14 0.96  CALCIUM 8.7* 8.6*   Liver Function Tests No results for input(s): AST, ALT, ALKPHOS, BILITOT, PROT, ALBUMIN in the last 72 hours. No results for input(s): LIPASE, AMYLASE in the  last 72 hours. Cardiac Enzymes No results for input(s): CKTOTAL, CKMB, CKMBINDEX, TROPONINI in the last 72 hours.  BNP: BNP (last 3 results)  Recent Labs  02/12/17 1221  BNP >4,500.0*    ProBNP (last 3 results) No results for input(s): PROBNP in the last 8760 hours.   D-Dimer No results for input(s): DDIMER in the last 72 hours. Hemoglobin A1C No results for input(s): HGBA1C in the last 72 hours. Fasting Lipid Panel No results for input(s): CHOL, HDL, LDLCALC, TRIG, CHOLHDL, LDLDIRECT in the last 72 hours. Thyroid Function Tests No results for input(s): TSH, T4TOTAL, T3FREE, THYROIDAB in the last 72 hours.  Invalid input(s): FREET3  Other results:   Imaging    No results found.   Medications:     Scheduled Medications: . arformoterol  15 mcg Nebulization BID  . aspirin EC  81 mg Oral Daily  . budesonide  (PULMICORT) nebulizer solution  0.5 mg Nebulization BID  . carvedilol  3.125 mg Oral BID WC  . cholecalciferol  2,000 Units Oral Daily  . digoxin  0.125 mg Oral Daily  . docusate sodium  100 mg Oral Daily  . DULoxetine  60 mg Oral Daily  . enoxaparin (LOVENOX) injection  40 mg Subcutaneous Q24H  . ferrous sulfate  325 mg Oral BID WC  . insulin aspart  0-5 Units Subcutaneous QHS  . insulin aspart  0-9 Units Subcutaneous TID WC  . ipratropium-albuterol  3 mL Nebulization BID  . levothyroxine  150 mcg Oral QAC breakfast  . losartan  25 mg Oral Daily  . pantoprazole  40 mg Oral QHS  . rosuvastatin  20 mg Oral q1800  . sodium chloride flush  3 mL Intravenous Q12H  . spironolactone  12.5 mg Oral Daily  . ticagrelor  90 mg Oral BID  . vitamin B-12  2,000 mcg Oral Daily    Infusions: . sodium chloride      PRN Medications: sodium chloride, acetaminophen, albuterol, ALPRAZolam, ondansetron (ZOFRAN) IV, sodium chloride flush    Patient Profile   60yoM with h/o chronic Systolic CHF (EF 21%), CAD s/p MI, ICD, COPD, OSA on CPAP, HTN, Hypothyroidism. Multiple recent hospitalizations admitted with recurrent respiratory failure and large left pleural effusion. Now s/p thoracentesis (transudative)   Assessment/Plan   1. Acute on chronic systolic HF due to iCM - EF 15-20% by echo 6/30. Moderate RV dysfunction. Viewed personally - Chronic NYHA IIIb-IV.  - RHC 7/2with compensated hemodynamics after diuresis.  - Volume status stable off diuretics. Continue current meds.   - Continue spiro 12.5 mg daily - Continue digoxin 0.125 mg daily - Continue losartan 25 mg daily. BP too low to switch to Entresto - Continue coreg 3.125 mg BID.   2. CAD s/p previous MI - Cath 7/1 with severe 3 v CAD with multiple areas of ISR - Dr. Haroldine Laws has reviewed films with Dr. Burt Knack and Dr. Prescott Gum - Suspect anterior wall is non-viable given previous LAD stent thrombosis.  - Will get 24-hour thallium  (has ICD so no MRI). Have discussed ordering and procedure with Nuclear Medicine team and awaiting call back.  If anterior wall viable will consider CABG. If non-viable consider high-risk PCI of RCA and large OM-1 - Continue ASA, statin, Brillinta  3. Acute on chronic hypoxic respiratory failure with large left pleural effusion - Much improved after thoracentesis.  - Per report report tap was bloody but effusion is transudative by Light's criteria. Cytology only with mesothelial cells commonly seen in HF.  Dr. Haroldine Laws reviewed with Dr. Titus Mould  - Repeat CT 6/30 showed moderate residual effusion.  - If recurs may need Pleurex. Will plan repeat CXR.   - Desats with exertion. Will likely need home O2. - Continue pulmonary toilet.  - Being treated for levaquin x 8 days for possible PNA per primary.  4. COPD - Stable. Per primary.   5. Mediastinal lymphadenopathy on chest CT - ? Related to HF or other underlying process. LDH ok. Consider outpatient PET. No change.   6. Failure to thrive/severe deconditioning - PT has seen. Functional capacity improving. Recommending SNF.   8. Hypokalemia - K 4.3. Continue to follow.    Will make NPO for thallium procedure.   Length of Stay: [place], [place]  02/17/2017, 9:52 AM  Advanced Heart Failure Team Pager [phone] (M-F; 7a - 4p)  Please contact [facility] for night-coverage after hours (4p -7a ) and weekends on [email or web address]  Patient seen and examined with the above-signed Advanced Practice Provider and/or Housestaff. I personally reviewed laboratory data, imaging studies and relevant notes. I independently examined the patient and formulated the important aspects of the plan. I have edited the note to reflect any of my changes or salient points. I have personally discussed the plan with the patient and/or family.  Volume status and functional capacity much improved. Able to walk halls with PT without too much difficulty. No  angina. Pleural effusion has not recurred.   I have discussed the case extensively with Drs. Cooper and [employer]. Will plan Thallium viability study to assess anterior wall viability. If no viability then best approach may be PCI of OM and RCA. If viable then will continue with CABG evaluation. Brillinta has been stopped.   I spoke with Nuclear Radiology today several times and we have been able to arrange delivery of Thallium for tomorrow am so viability study will begin in am and we should have delayed uptake images by Saturday to facilitate decision making.   Glori Bickers, MD  8:21 PM

## 2017-02-17 NOTE — Progress Notes (Signed)
Patient does not want to wear the CPAP at this time.

## 2017-02-17 NOTE — Progress Notes (Signed)
3 Days Post-Op Procedure(s) (LRB): Right/Left Heart Cath and Coronary Angiography (N/A) Subjective: Patient examined, cardiac catheterization and echocardiogram images personally reviewed CT scan of chest images personally reviewed  61 year old reformed smoker with several recent admissions to area hospitals for heart failure presented with acute respiratory failure from acute systolic heart failure and was seen by the advanced heart failure service. His ejection fraction is 15-20 percent with anterior akinesia. No significant valvular insufficiency. He has severe three-vessel coronary disease. The patient responded well to diuretic therapy and left thoracentesis and is now much more comfortable. His appetite has significantly improved as well as his overall strength. Is able to walk outside his room in the hallway without difficulty. Right heart catheterization showed adequate cardiac output, pulmonary hypertension, and normal CVP with wedge of 19. and he was not started on milrinone. He is in a sinus rhythm. The patient is on Brilinta from previous remote stents to the LAD and OM. Cardiac catheterization shows severe in-stent stenosis of his LAD and OM vessels. He also has a high-grade stenosis of a diagonal and moderate stenosis of his mid RCA. He has adequate but not optimal targets for bypass grafting. Patient is currently undergoing a viability study with thallium since he has had a previous AICD. He denies having been shocked by his AICD.  Review of his CT scan shows some changes of COPD and a 3.5 cm right paratracheal node without discrete parenchymal pulmonary mass. Renal function is normal, Albumin is 2.8 LFTs are pending Objective: Vital signs in last 24 hours: Temp:  [97.5 F (36.4 C)-98.2 F (36.8 C)] 97.5 F (36.4 C) (07/05 1133) Pulse Rate:  [98-101] 101 (07/05 1133) Cardiac Rhythm: Normal sinus rhythm;Bundle branch block;Heart block (07/05 0833) Resp:  [18] 18 (07/05 1133) BP:  (107-114)/(66-83) 113/76 (07/05 1133) SpO2:  [93 %-100 %] 100 % (07/05 1133) Weight:  [158 lb 12.8 oz (72 kg)] 158 lb 12.8 oz (72 kg) (07/05 0500)  Hemodynamic parameters for last 24 hours:  stable  Intake/Output from previous day: 07/04 0701 - 07/05 0700 In: 830 [P.O.:830] Out: 2330 [Urine:2330] Intake/Output this shift: No intake/output data recorded.       Exam    General- appears older than stated age and chronically ill but alert and comfortable   Lungs- clear without rales, wheezes   Cor- regular rate and rhythm, no murmur , gallop   Abdomen- soft, non-tender   Extremities - warm, non-tender, minimal edema   Neuro- oriented, appropriate, no focal weakness    Lab Results: No results for input(s): WBC, HGB, HCT, PLT in the last 72 hours. BMET:  Recent Labs  02/16/17 0222 02/17/17 0401  NA 135 134*  K 4.2 4.3  CL 99* 100*  CO2 28 28  GLUCOSE 86 112*  BUN 18 18  CREATININE 1.14 0.96  CALCIUM 8.7* 8.6*    PT/INR: No results for input(s): LABPROT, INR in the last 72 hours. ABG    Component Value Date/Time   PHART 7.478 (H) 02/14/2017 0924   HCO3 29.8 (H) 02/14/2017 0930   TCO2 31 02/14/2017 0930   ACIDBASEDEF 4.4 (H) 02/09/2017 0110   O2SAT 60.0 02/14/2017 0930   CBG (last 3)   Recent Labs  02/17/17 0740 02/17/17 1110 02/17/17 1710  GLUCAP 125* 114* 98    Assessment/Plan: S/P Procedure(s) (LRB): Right/Left Heart Cath and Coronary Angiography (N/A) Ischemic cardiomyopathy with very poor LVEF Currently with compensated hemodynamics Large right paratracheal node with remote history of smoking  Agree with waiting  for results of thallium delayed perfusion scan to assess viability. We'll stop brillinta now to allow adequate washout so that he could undergo CABG next week. We'll follow    LOS: 9 days    Tharon Aquas Trigt III 02/17/2017

## 2017-02-17 NOTE — Progress Notes (Signed)
PROGRESS NOTE    Stephen Cardenas  URK:[id number] DOB: 06-08-1956 DOA: 02/08/2017 PCP: System, Pcp Not In    Brief Narrative: 61 year old with severe vessel CAD, severe ischemic cardiomyopathy, with LVEF of 15%, hypertension, hypothyroidism, OSA on CPAP, COPD, s/p AICD,  presented with DOE, sepsis, with para pneumonic effusion.   Assessment & Plan:   Active Problems:   Acute respiratory failure with hypoxia (HCC)   Ventilator dependent (HCC)   Shortness of breath   SOB (shortness of breath)   Pleural effusion   Acute on chronic systolic (congestive) heart failure (HCC)   CAD in native artery   Acute respiratory failure with hypoxia, secondary to acute on chronic systolic CHF, left pleural effusion,  Suspect parapneumonia effusion vs transudate from CHF, .  Diuresis with IV lasix and his breathing has improved. Has diuresed about 8.5 liters since admission.  Cardiac cath showed severe three vessel disease and multiple areas of in stent restenosis.  Will need further work up with 24 hour thallium with nuclear medicine, if anterior wall viable with possibly consider CABG vs PCI if non viable, as per cardiology.  Resume aspirin, statin and Brillinta., spironolactone 12.5 mg daily, coreg.  S/p thoracentesis. Cytology showing mesothelial cells. Completed the course of antibiotics. Urine for strep and legionella antigen negative.  Repeat CXR in am pending.   Elevated troponins: Demand ischemia with CHF.    COPD:  No wheezing heard, resume pulmicort and brovana.  Continue with duoneb.    AKI; resolved.   Anemia:  Normocytic.  Hemoglobin stable at 9.6.   Repeat cbc in am.    Hypertension:  Controlled.  Resume cozaar.    Hyperlipidemia:  Resume Crestor.    Hypothyroidism: Resume synthroid.   Mediastinal adenopathy seen on CT,  Reactive? , recommend outpatient PET scan.       DVT prophylaxis: lovenox.  Code Status: (Full Family Communication: none at bedside.    Disposition Plan: home with home PT when stable , currently awaiting further work up.    Consultants:   HF team.   PCCM.    Procedures:   Left sided thoracentesis  Echocardiogram  Left and right heart catheterization   Antimicrobials: completed the course of antibiotics.    Subjective: Breathing is better.  Chest pain none.  No nausea, vomiting.  No abd pain.  NO NEW COMPLAINTS.    Objective: Vitals:   02/16/17 2001 02/17/17 0500 02/17/17 0810 02/17/17 1133  BP: 107/83 114/66  113/76  Pulse: 98 98  (!) 101  Resp: 18 18  18   Temp: 98.2 F (36.8 C) 98.2 F (36.8 C)  (!) 97.5 F (36.4 C)  TempSrc: Oral Oral  Oral  SpO2: 99% 93% 100% 100%  Weight:  72 kg (158 lb 12.8 oz)    Height:        Intake/Output Summary (Last 24 hours) at 02/17/17 1346 Last data filed at 02/17/17 1344  Gross per 24 hour  Intake             1455 ml  Output             2260 ml  Net             -805 ml   Filed Weights   02/15/17 0448 02/16/17 0605 02/17/17 0500  Weight: 73.6 kg (162 lb 3.2 oz) 71.7 kg (158 lb) 72 kg (158 lb 12.8 oz)    Examination:  General exam: Appears calm and comfortable on 2lit of [place] Oxygen.  Respiratory system: decrased breath sounds at bases. No wheezing or rhonchi.  Cardiovascular system: S1 & S2 heard, RRR, no murmers.  Gastrointestinal system: abd is soft NT nd bs+ Central nervous system: Alert and oriented. No focal neurological deficits. Extremities: Symmetric 5 x 5 power.  Skin: No rashes, lesions or ulcers Psychiatry: Judgement and insight appear normal. Mood & affect appropriate.     Data Reviewed: I have personally reviewed following labs and imaging studies  CBC:  Recent Labs Lab 02/11/17 0340 02/14/17 0612  WBC 11.9* 8.8  HGB 9.0* 9.6*  HCT 30.1* 31.8*  MCV 89.1 86.6  PLT 250 657   Basic Metabolic Panel:  Recent Labs Lab 02/11/17 0340  02/13/17 0417 02/14/17 0612 02/15/17 0430 02/16/17 0222 02/17/17 0401  NA 134*  < >  131* 133* 134* 135 134*  K 4.5  < > 3.5 3.8 4.3 4.2 4.3  CL 101  < > 97* 98* 101 99* 100*  CO2 25  < > 26 28 28 28 28   GLUCOSE 92  < > 137* 89 103* 86 112*  BUN 22*  < > 29* 25* 22* 18 18  CREATININE 0.99  < > 0.97 0.92 1.20 1.14 0.96  CALCIUM 8.5*  < > 8.3* 8.5* 8.4* 8.7* 8.6*  MG 2.0  --   --   --   --   --   --   < > = values in this interval not displayed. GFR: Estimated Creatinine Clearance: 81.3 mL/min (by C-G formula based on SCr of 0.96 mg/dL). Liver Function Tests: No results for input(s): AST, ALT, ALKPHOS, BILITOT, PROT, ALBUMIN in the last 168 hours. No results for input(s): LIPASE, AMYLASE in the last 168 hours. No results for input(s): AMMONIA in the last 168 hours. Coagulation Profile:  Recent Labs Lab 02/14/17 0612  INR 1.29   Cardiac Enzymes:  Recent Labs Lab 02/12/17 0012 02/12/17 0456 02/12/17 1221  TROPONINI 0.10* 0.10* 0.09*   BNP (last 3 results) No results for input(s): PROBNP in the last 8760 hours. HbA1C: No results for input(s): HGBA1C in the last 72 hours. CBG:  Recent Labs Lab 02/16/17 1219 02/16/17 1609 02/16/17 2103 02/17/17 0740 02/17/17 1110  GLUCAP 107* 117* 102* 125* 114*   Lipid Profile: No results for input(s): CHOL, HDL, LDLCALC, TRIG, CHOLHDL, LDLDIRECT in the last 72 hours. Thyroid Function Tests: No results for input(s): TSH, T4TOTAL, FREET4, T3FREE, THYROIDAB in the last 72 hours. Anemia Panel: No results for input(s): VITAMINB12, FOLATE, FERRITIN, TIBC, IRON, RETICCTPCT in the last 72 hours. Sepsis Labs: No results for input(s): PROCALCITON, LATICACIDVEN in the last 168 hours.  Recent Results (from the past 240 hour(s))  MRSA PCR Screening     Status: Abnormal   Collection Time: 02/08/17 11:49 PM  Result Value Ref Range Status   MRSA by PCR POSITIVE (A) NEGATIVE Final    Comment:        The GeneXpert MRSA Assay (FDA approved for NASAL specimens only), is one component of a comprehensive MRSA  colonization surveillance program. It is not intended to diagnose MRSA infection nor to guide or monitor treatment for MRSA infections. RESULT CALLED TO, READ BACK BY AND VERIFIED WITH: C.EUBANKS,RN AT 0213 BY L.PITT 02/09/17   Body fluid culture (includes gram stain)     Status: None   Collection Time: 02/09/17  2:20 AM  Result Value Ref Range Status   Specimen Description FLUID PLEURAL  Final   Special Requests NONE  Final   Gram Stain  Final    FEW WBC PRESENT, PREDOMINANTLY MONONUCLEAR NO ORGANISMS SEEN    Culture NO GROWTH 3 DAYS  Final   Report Status 02/12/2017 FINAL  Final         Radiology Studies: No results found.      Scheduled Meds: . arformoterol  15 mcg Nebulization BID  . aspirin EC  81 mg Oral Daily  . budesonide (PULMICORT) nebulizer solution  0.5 mg Nebulization BID  . carvedilol  3.125 mg Oral BID WC  . cholecalciferol  2,000 Units Oral Daily  . digoxin  0.125 mg Oral Daily  . docusate sodium  100 mg Oral Daily  . DULoxetine  60 mg Oral Daily  . enoxaparin (LOVENOX) injection  40 mg Subcutaneous Q24H  . ferrous sulfate  325 mg Oral BID WC  . insulin aspart  0-5 Units Subcutaneous QHS  . insulin aspart  0-9 Units Subcutaneous TID WC  . ipratropium-albuterol  3 mL Nebulization BID  . levothyroxine  150 mcg Oral QAC breakfast  . losartan  25 mg Oral Daily  . pantoprazole  40 mg Oral QHS  . rosuvastatin  20 mg Oral q1800  . sodium chloride flush  3 mL Intravenous Q12H  . spironolactone  12.5 mg Oral Daily  . ticagrelor  90 mg Oral BID  . vitamin B-12  2,000 mcg Oral Daily   Continuous Infusions: . sodium chloride       LOS: 9 days    Time spent: 16 min    Errick Salts, MD [facility] Pager [phone]  If 7PM-7AM, please contact night-coverage [email or web address] Password [email or web address] 02/17/2017, 1:46 PM

## 2017-02-17 NOTE — Progress Notes (Signed)
CARDIAC REHAB PHASE I   PRE:  Rate/Rhythm: 95 SR    BP: sitting 105/65    SaO2: 96 2L  MODE:  Ambulation: 460 ft   POST:  Rate/Rhythm: 108 ST    BP: sitting 107/68     SaO2: 96 2L  Pt able to walk without c/o. Moving well. He is so happy to have fluid down and feel better. Denied SOB.  VSS. Will f/u. [phone]  [place], ACSM 02/17/2017 12:18 PM

## 2017-02-18 ENCOUNTER — Inpatient Hospital Stay (HOSPITAL_COMMUNITY): Payer: Medicare Other

## 2017-02-18 DIAGNOSIS — I2510 Atherosclerotic heart disease of native coronary artery without angina pectoris: Secondary | ICD-10-CM

## 2017-02-18 DIAGNOSIS — I5023 Acute on chronic systolic (congestive) heart failure: Secondary | ICD-10-CM

## 2017-02-18 LAB — PULMONARY FUNCTION TEST
DL/VA % pred: 66 %
DL/VA: 3.08 ml/min/mmHg/L
DLCO cor % pred: 31 %
DLCO cor: 10.21 ml/min/mmHg
DLCO unc % pred: 26 %
DLCO unc: 8.41 ml/min/mmHg
FEF 25-75 Post: 3.33 L/sec
FEF 25-75 Pre: 3.09 L/sec
FEF2575-%Change-Post: 8 %
FEF2575-%Pred-Post: 117 %
FEF2575-%Pred-Pre: 109 %
FEV1-%Change-Post: 3 %
FEV1-%Pred-Post: 60 %
FEV1-%Pred-Pre: 58 %
FEV1-Post: 2.13 L
FEV1-Pre: 2.05 L
FEV1FVC-%Change-Post: 0 %
FEV1FVC-%Pred-Pre: 114 %
FEV6-%Change-Post: 4 %
FEV6-%Pred-Post: 56 %
FEV6-%Pred-Pre: 53 %
FEV6-Post: 2.5 L
FEV6-Pre: 2.39 L
FEV6FVC-%Pred-Post: 105 %
FEV6FVC-%Pred-Pre: 105 %
FVC-%Change-Post: 4 %
FVC-%Pred-Post: 53 %
FVC-%Pred-Pre: 50 %
FVC-Post: 2.5 L
FVC-Pre: 2.39 L
Post FEV1/FVC ratio: 85 %
Post FEV6/FVC ratio: 100 %
Pre FEV1/FVC ratio: 86 %
Pre FEV6/FVC Ratio: 100 %
RV % pred: 64 %
RV: 1.48 L
TLC % pred: 56 %
TLC: 3.99 L

## 2017-02-18 LAB — GLUCOSE, CAPILLARY
Glucose-Capillary: 98 mg/dL (ref 65–99)
Glucose-Capillary: 91 mg/dL (ref 65–99)
Glucose-Capillary: 75 mg/dL (ref 65–99)
Glucose-Capillary: 122 mg/dL — ABNORMAL HIGH (ref 65–99)
Glucose-Capillary: 92 mg/dL (ref 65–99)

## 2017-02-18 LAB — HEPATIC FUNCTION PANEL
ALT: 20 U/L (ref 17–63)
AST: 44 U/L — ABNORMAL HIGH (ref 15–41)
Albumin: 2.7 g/dL — ABNORMAL LOW (ref 3.5–5.0)
Alkaline Phosphatase: 98 U/L (ref 38–126)
Bilirubin, Direct: 0.5 mg/dL (ref 0.1–0.5)
Indirect Bilirubin: 0.8 mg/dL (ref 0.3–0.9)
Total Bilirubin: 1.3 mg/dL — ABNORMAL HIGH (ref 0.3–1.2)
Total Protein: 6.1 g/dL — ABNORMAL LOW (ref 6.5–8.1)

## 2017-02-18 LAB — BASIC METABOLIC PANEL
Anion gap: 6 (ref 5–15)
BUN: 18 mg/dL (ref 6–20)
CHLORIDE: 102 mmol/L (ref 101–111)
CO2: 26 mmol/L (ref 22–32)
Calcium: 8.6 mg/dL — ABNORMAL LOW (ref 8.9–10.3)
Creatinine, Ser: 0.83 mg/dL (ref 0.61–1.24)
GFR calc Af Amer: >60 mL/min (ref >60)
GFR calc non Af Amer: >60 mL/min (ref >60)
Glucose, Bld: 108 mg/dL — ABNORMAL HIGH (ref 65–99)
POTASSIUM: 4 mmol/L (ref 3.5–5.1)
Sodium: 134 mmol/L — ABNORMAL LOW (ref 135–145)

## 2017-02-18 LAB — DIGOXIN LEVEL: Digoxin Level: 0.3 ng/mL — ABNORMAL LOW (ref 0.8–2.0)

## 2017-02-18 MED ORDER — ALBUTEROL SULFATE (2.5 MG/3ML) 0.083% IN NEBU
2.5000 mg | INHALATION_SOLUTION | Freq: Once | RESPIRATORY_TRACT | Status: AC
  Administered 2017-02-18: 2.5 mg via RESPIRATORY_TRACT

## 2017-02-18 MED ORDER — THALLOUS CHLORIDE TL 201 1 MCI/ML IV SOLN
3.8000 | Freq: Once | INTRAVENOUS | Status: AC | PRN
  Administered 2017-02-18: 3.8 via INTRAVENOUS

## 2017-02-18 MED ORDER — THALLOUS CHLORIDE TL 201 1 MCI/ML IV SOLN
1.1000 | Freq: Once | INTRAVENOUS | Status: AC | PRN
  Administered 2017-02-18: 1.1 via INTRAVENOUS

## 2017-02-18 NOTE — Progress Notes (Signed)
Patient in Nuclear Medicine for Thallium stress test.

## 2017-02-18 NOTE — Progress Notes (Signed)
Pt refuse CPAP for the night. Pt is stable at this time taking his nebs

## 2017-02-18 NOTE — Progress Notes (Signed)
Respiratory called and is coming to pick up patient for PFT test.  Stephen Cardenas is scheduled to go back to Nuclear Medicine @ 1530 to finish Thallium stress test.  Respiratory staff informed on Nuclear Medicine test at 1530 and stated they will have him back in time.  Patient updated on plan of care.

## 2017-02-18 NOTE — Progress Notes (Signed)
PROGRESS NOTE    Stephen Cardenas  SEG:[id number] DOB: [birth year] DOA: 02/08/2017 PCP: System, Pcp Not In    Brief Narrative: 61 year old with severe vessel CAD, severe ischemic cardiomyopathy, with LVEF of 15%, hypertension, hypothyroidism, OSA on CPAP, COPD, s/p AICD,  presented with DOE, sepsis, with para pneumonic effusion.   Assessment & Plan:   Active Problems:   Acute respiratory failure with hypoxia (HCC)   Ventilator dependent (HCC)   Shortness of breath   SOB (shortness of breath)   Pleural effusion   Acute on chronic systolic (congestive) heart failure (HCC)   CAD in native artery   Acute respiratory failure with hypoxia, secondary to acute on chronic systolic CHF, left pleural effusion,  Suspect parapneumonia effusion vs transudate from CHF, .  Diuresis with IV lasix and his breathing has improved. Has diuresed about 9.5  liters since admission.  Cardiac cath showed severe three vessel disease and multiple areas of in stent restenosis.  Work up with NM stress test, if anterior wall viable with possibly consider CABG vs PCI if non viable, as per cardiology. CTVS consulted and recommendations given.  Resume aspirin, statin , spironolactone 12.5 mg daily, coreg.  S/p thoracentesis. Cytology showing mesothelial cells. Completed the course of antibiotics. Urine for strep and legionella antigen negative.  Repeat CXR shows Persistent abnormal opacity at the left lung base best consistent with effusion, pneumonia, and possibly atelectasis.  Elevated troponins: Demand ischemia with CHF.    COPD:  No wheezing heard, resume pulmicort and brovana.  Continue with duoneb.    AKI; resolved.   Anemia:  Normocytic.  Hemoglobin stable at 9.6.   Repeat cbc in am.    Hypertension:  Controlled.  Resume cozaar.    Hyperlipidemia:  Resume Crestor.    Hypothyroidism: Resume synthroid.   Mediastinal adenopathy seen on CT,  Reactive? , recommend outpatient PET scan.    Mild hyponatremia asymptomatic.      DVT prophylaxis: lovenox.  Code Status: (Full Family Communication: none at bedside.  Disposition Plan: home with home PT when stable , currently awaiting further work up.    Consultants:   HF team.   PCCM.   CTVS   Procedures:   Left sided thoracentesis  Echocardiogram  Left and right heart catheterization   Antimicrobials: completed the course of antibiotics.    Subjective: Breathing is better.  Walking in te hallway without sob or dizziness.  No chest pain.  No nausea or vomiting.   Objective: Vitals:   02/18/17 0812 02/18/17 0817 02/18/17 0819 02/18/17 1027  BP:    (!) 114/91  Pulse:    84  Resp:      Temp:      TempSrc:      SpO2: 100% 100% 100%   Weight:      Height:        Intake/Output Summary (Last 24 hours) at 02/18/17 1315 Last data filed at 02/18/17 1030  Gross per 24 hour  Intake             1020 ml  Output             1820 ml  Net             -800 ml   Filed Weights   02/16/17 0605 02/17/17 0500 02/18/17 0525  Weight: 71.7 kg (158 lb) 72 kg (158 lb 12.8 oz) 72.2 kg (159 lb 3.2 oz)    Examination:  General exam: Appears calm and comfortable on 2lit  of [place] Oxygen.  Respiratory system: decreased breath sounds at bases, no wheezing or rhonchi.  Cardiovascular system: S1 & S2 heard, RRR, no murmers.  Gastrointestinal system: abd is soft NT nd bs+ Central nervous system: Alert and oriented. Non focal.  Extremities: Symmetric 5 x 5 power. No cyanosis or clubbing.  Skin: No rashes, lesions or ulcers Psychiatry: Judgement and insight appear normal. Mood & affect appropriate.     Data Reviewed: I have personally reviewed following labs and imaging studies  CBC:  Recent Labs Lab 02/14/17 0612  WBC 8.8  HGB 9.6*  HCT 31.8*  MCV 86.6  PLT 086   Basic Metabolic Panel:  Recent Labs Lab 02/14/17 0612 02/15/17 0430 02/16/17 0222 02/17/17 0401 02/18/17 0250  NA 133* 134* 135 134* 134*   K 3.8 4.3 4.2 4.3 4.0  CL 98* 101 99* 100* 102  CO2 28 28 28 28 26   GLUCOSE 89 103* 86 112* 108*  BUN 25* 22* 18 18 18   CREATININE 0.92 1.20 1.14 0.96 0.83  CALCIUM 8.5* 8.4* 8.7* 8.6* 8.6*   GFR: Estimated Creatinine Clearance: 94.2 mL/min (by C-G formula based on SCr of 0.83 mg/dL). Liver Function Tests:  Recent Labs Lab 02/18/17 0250  AST 44*  ALT 20  ALKPHOS 98  BILITOT 1.3*  PROT 6.1*  ALBUMIN 2.7*   No results for input(s): LIPASE, AMYLASE in the last 168 hours. No results for input(s): AMMONIA in the last 168 hours. Coagulation Profile:  Recent Labs Lab 02/14/17 0612  INR 1.29   Cardiac Enzymes:  Recent Labs Lab 02/12/17 0012 02/12/17 0456 02/12/17 1221  TROPONINI 0.10* 0.10* 0.09*   BNP (last 3 results) No results for input(s): PROBNP in the last 8760 hours. HbA1C: No results for input(s): HGBA1C in the last 72 hours. CBG:  Recent Labs Lab 02/17/17 0740 02/17/17 1110 02/17/17 1710 02/17/17 2122 02/18/17 0750  GLUCAP 125* 114* 98 95 98   Lipid Profile: No results for input(s): CHOL, HDL, LDLCALC, TRIG, CHOLHDL, LDLDIRECT in the last 72 hours. Thyroid Function Tests: No results for input(s): TSH, T4TOTAL, FREET4, T3FREE, THYROIDAB in the last 72 hours. Anemia Panel: No results for input(s): VITAMINB12, FOLATE, FERRITIN, TIBC, IRON, RETICCTPCT in the last 72 hours. Sepsis Labs: No results for input(s): PROCALCITON, LATICACIDVEN in the last 168 hours.  Recent Results (from the past 240 hour(s))  MRSA PCR Screening     Status: Abnormal   Collection Time: 02/08/17 11:49 PM  Result Value Ref Range Status   MRSA by PCR POSITIVE (A) NEGATIVE Final    Comment:        The GeneXpert MRSA Assay (FDA approved for NASAL specimens only), is one component of a comprehensive MRSA colonization surveillance program. It is not intended to diagnose MRSA infection nor to guide or monitor treatment for MRSA infections. RESULT CALLED TO, READ BACK BY  AND VERIFIED WITH: C.EUBANKS,RN AT 0213 BY L.PITT 02/09/17   Body fluid culture (includes gram stain)     Status: None   Collection Time: 02/09/17  2:20 AM  Result Value Ref Range Status   Specimen Description FLUID PLEURAL  Final   Special Requests NONE  Final   Gram Stain   Final    FEW WBC PRESENT, PREDOMINANTLY MONONUCLEAR NO ORGANISMS SEEN    Culture NO GROWTH 3 DAYS  Final   Report Status 02/12/2017 FINAL  Final         Radiology Studies: Dg Chest Port 1 View  Result Date: 02/17/2017 CLINICAL DATA:  Pacemaker insertion, shortness of breath EXAM: PORTABLE CHEST 1 VIEW COMPARISON:  CT chest of 02/12/2017 and portable chest x-ray of same day FINDINGS: There is persistent abnormal opacity at the left lung base consistent with left pleural effusion, and probable left basilar pneumonia and atelectasis. The right lung is clear. Mediastinal and hilar contours are unchanged and cardiomegaly is stable. Permanent pacemaker is present with AICD lead. No pneumothorax is seen. IMPRESSION: 1. Persistent abnormal opacity at the left lung base best consistent with effusion, pneumonia, and possibly atelectasis. 2. Stable cardiomegaly.  Permanent pacemaker with AICD lead. Electronically Signed   By: Ivar Drape M.D.   On: 02/17/2017 15:26        Scheduled Meds: . arformoterol  15 mcg Nebulization BID  . aspirin EC  325 mg Oral Daily  . budesonide (PULMICORT) nebulizer solution  0.5 mg Nebulization BID  . carvedilol  3.125 mg Oral BID WC  . cholecalciferol  2,000 Units Oral Daily  . digoxin  0.125 mg Oral Daily  . docusate sodium  100 mg Oral Daily  . DULoxetine  60 mg Oral Daily  . enoxaparin (LOVENOX) injection  40 mg Subcutaneous Q24H  . ferrous sulfate  325 mg Oral BID WC  . insulin aspart  0-5 Units Subcutaneous QHS  . insulin aspart  0-9 Units Subcutaneous TID WC  . ipratropium-albuterol  3 mL Nebulization BID  . levothyroxine  150 mcg Oral QAC breakfast  . losartan  25 mg Oral  Daily  . mupirocin ointment   Nasal BID  . pantoprazole  40 mg Oral QHS  . rosuvastatin  20 mg Oral q1800  . sodium chloride flush  3 mL Intravenous Q12H  . spironolactone  12.5 mg Oral Daily  . vitamin B-12  2,000 mcg Oral Daily   Continuous Infusions: . sodium chloride       LOS: 10 days    Time spent: 69 min    Zaya Kessenich, MD [facility] Pager [phone]  If 7PM-7AM, please contact night-coverage [email or web address] Password [email or web address] 02/18/2017, 1:15 PM

## 2017-02-18 NOTE — Progress Notes (Signed)
4 Days Post-Op Procedure(s) (LRB): Right/Left Heart Cath and Coronary Angiography (N/A) Subjective: Thallium viability study pending PFTs- DLCO poor, 26-31% predicted Large 3.5 cm  R paratracheal mass/node Objective: Vital signs in last 24 hours: Temp:  [97.5 F (36.4 C)-98.4 F (36.9 C)] 97.5 F (36.4 C) (07/06 1319) Pulse Rate:  [84-101] 101 (07/06 1319) Cardiac Rhythm: Normal sinus rhythm;Heart block;Bundle branch block (07/06 0701) Resp:  [18] 18 (07/06 1319) BP: (105-129)/(76-91) 129/79 (07/06 1319) SpO2:  [96 %-100 %] 96 % (07/06 1319) Weight:  [159 lb 3.2 oz (72.2 kg)] 159 lb 3.2 oz (72.2 kg) (07/06 0525)  Hemodynamic parameters for last 24 hours:    Intake/Output from previous day: 07/05 0701 - 07/06 0700 In: 1260 [P.O.:1260] Out: 1710 [Urine:1710] Intake/Output this shift: Total I/O In: -  Out: 550 [Urine:550]    Lab Results: No results for input(s): WBC, HGB, HCT, PLT in the last 72 hours. BMET:  Recent Labs  02/17/17 0401 02/18/17 0250  NA 134* 134*  K 4.3 4.0  CL 100* 102  CO2 28 26  GLUCOSE 112* 108*  BUN 18 18  CREATININE 0.96 0.83  CALCIUM 8.6* 8.6*    PT/INR: No results for input(s): LABPROT, INR in the last 72 hours. ABG    Component Value Date/Time   PHART 7.478 (H) 02/14/2017 0924   HCO3 29.8 (H) 02/14/2017 0930   TCO2 31 02/14/2017 0930   ACIDBASEDEF 4.4 (H) 02/09/2017 0110   O2SAT 60.0 02/14/2017 0930   CBG (last 3)   Recent Labs  02/18/17 0750 02/18/17 1316 02/18/17 1617  GLUCAP 98 91 75    Assessment/Plan: S/P Procedure(s) (LRB): Right/Left Heart Cath and Coronary Angiography (N/A) Marginal CABG candidate Will wait for viability data   LOS: 10 days    Stephen Cardenas 02/18/2017

## 2017-02-18 NOTE — Progress Notes (Signed)
Advanced Heart Failure Rounding Note  Primary Cardiologist: New  Subjective:    L/RHC y7/1 with severe 3 v CAD with multiple areas of in-stent re-stenosis, severe iCM with EF 15% in setting of previous large anterior MI, and relatively well-compensated hemodynamics after diuresis. Full report below.   Feeling good this am. Denies lightheadedness or dizziness. No CP or DOE walking room/hallways.   Creatinine stable at 0.83. K 4.0.   Thallium uptake study pending.    [facility] 02/14/17  Mid RCA-2 lesion, 80 %stenosed.  Mid RCA-1 lesion, 90 %stenosed.  Prox RCA-2 lesion, 50 %stenosed.  Prox RCA-1 lesion, 30 %stenosed.  Dist RCA lesion, 30 %stenosed.  2nd Mrg lesion, 95 %stenosed.  Mid LAD to Dist LAD lesion, 95 %stenosed.  Dist LAD lesion, 30 %stenosed.  2nd Diag lesion, 99 %stenosed.  There is severe left ventricular systolic dysfunction.   Findings:  Ao = 108/78 (91) LV = 108/20 RA =  6 RV = 54/9 PA = 57/33 (41) PCW = 19 Fick cardiac output/index = 5.9/3.1 PVR = 3.7 WU SVR = 1150  Ao sat = 94% PA sat = 58%, 60%  Assessment: 1. Severe 3 v CAD with multiple areas of in-stent restenosis 2. Severe iCM with EF 15% in setting of previous large anterior MI 3. Relatively well-compensated hemodynamics after diuresis  Objective:   Weight Range: 159 lb 3.2 oz (72.2 kg) Body mass index is 22.84 kg/m.   Vital Signs:   Temp:  [97.5 F (36.4 C)-98.4 F (36.9 C)] 98.4 F (36.9 C) (07/06 0525) Pulse Rate:  [86-101] 86 (07/06 0525) Resp:  [18] 18 (07/06 0525) BP: (105-113)/(76-79) 105/79 (07/06 0525) SpO2:  [97 %-100 %] 100 % (07/06 0819) Weight:  [159 lb 3.2 oz (72.2 kg)] 159 lb 3.2 oz (72.2 kg) (07/06 0525) Last BM Date: 02/17/17  Weight change: Filed Weights   02/16/17 0605 02/17/17 0500 02/18/17 0525  Weight: 158 lb (71.7 kg) 158 lb 12.8 oz (72 kg) 159 lb 3.2 oz (72.2 kg)    Intake/Output:   Intake/Output Summary (Last 24 hours) at 02/18/17  0924 Last data filed at 02/18/17 0726  Gross per 24 hour  Intake             1260 ml  Output             1860 ml  Net             -600 ml      Physical Exam    General: Lying in bed. NAD.  HEENT: Normal Neck: Supple. JVP does not appear elevated. Carotids 2+ bilat; no bruits. No thyromegaly or nodule noted. Cor: PMI nondisplaced. RRR, No M/G/R noted Lungs: CTAB, normal effort. Abdomen: Soft, non-tender, non-distended, no HSM. No bruits or masses. +BS  Extremities: No cyanosis, clubbing, rash, R and LLE no edema.  Neuro: Alert & orientedx3, cranial nerves grossly intact. moves all 4 extremities w/o difficulty. Affect pleasant   Telemetry   Personally reviewed, sinus 90-100     EKG    N/A  Labs    CBC No results for input(s): WBC, NEUTROABS, HGB, HCT, MCV, PLT in the last 72 hours. Basic Metabolic Panel  Recent Labs  02/17/17 0401 02/18/17 0250  NA 134* 134*  K 4.3 4.0  CL 100* 102  CO2 28 26  GLUCOSE 112* 108*  BUN 18 18  CREATININE 0.96 0.83  CALCIUM 8.6* 8.6*   Liver Function Tests  Recent Labs  02/18/17 0250  AST  44*  ALT 20  ALKPHOS 98  BILITOT 1.3*  PROT 6.1*  ALBUMIN 2.7*   No results for input(s): LIPASE, AMYLASE in the last 72 hours. Cardiac Enzymes No results for input(s): CKTOTAL, CKMB, CKMBINDEX, TROPONINI in the last 72 hours.  BNP: BNP (last 3 results)  Recent Labs  02/12/17 1221  BNP >4,500.0*    ProBNP (last 3 results) No results for input(s): PROBNP in the last 8760 hours.   D-Dimer No results for input(s): DDIMER in the last 72 hours. Hemoglobin A1C No results for input(s): HGBA1C in the last 72 hours. Fasting Lipid Panel No results for input(s): CHOL, HDL, LDLCALC, TRIG, CHOLHDL, LDLDIRECT in the last 72 hours. Thyroid Function Tests No results for input(s): TSH, T4TOTAL, T3FREE, THYROIDAB in the last 72 hours.  Invalid input(s): FREET3  Other results:   Imaging    Dg Chest Port 1 View  Result Date:  02/17/2017 CLINICAL DATA:  Pacemaker insertion, shortness of breath EXAM: PORTABLE CHEST 1 VIEW COMPARISON:  CT chest of 02/12/2017 and portable chest x-ray of same day FINDINGS: There is persistent abnormal opacity at the left lung base consistent with left pleural effusion, and probable left basilar pneumonia and atelectasis. The right lung is clear. Mediastinal and hilar contours are unchanged and cardiomegaly is stable. Permanent pacemaker is present with AICD lead. No pneumothorax is seen. IMPRESSION: 1. Persistent abnormal opacity at the left lung base best consistent with effusion, pneumonia, and possibly atelectasis. 2. Stable cardiomegaly.  Permanent pacemaker with AICD lead. Electronically Signed   By: Ivar Drape M.D.   On: 02/17/2017 15:26     Medications:     Scheduled Medications: . arformoterol  15 mcg Nebulization BID  . aspirin EC  325 mg Oral Daily  . budesonide (PULMICORT) nebulizer solution  0.5 mg Nebulization BID  . carvedilol  3.125 mg Oral BID WC  . cholecalciferol  2,000 Units Oral Daily  . digoxin  0.125 mg Oral Daily  . docusate sodium  100 mg Oral Daily  . DULoxetine  60 mg Oral Daily  . enoxaparin (LOVENOX) injection  40 mg Subcutaneous Q24H  . ferrous sulfate  325 mg Oral BID WC  . insulin aspart  0-5 Units Subcutaneous QHS  . insulin aspart  0-9 Units Subcutaneous TID WC  . ipratropium-albuterol  3 mL Nebulization BID  . levothyroxine  150 mcg Oral QAC breakfast  . losartan  25 mg Oral Daily  . mupirocin ointment   Nasal BID  . pantoprazole  40 mg Oral QHS  . rosuvastatin  20 mg Oral q1800  . sodium chloride flush  3 mL Intravenous Q12H  . spironolactone  12.5 mg Oral Daily  . vitamin B-12  2,000 mcg Oral Daily    Infusions: . sodium chloride      PRN Medications: sodium chloride, acetaminophen, albuterol, ALPRAZolam, ondansetron (ZOFRAN) IV, sodium chloride flush    Patient Profile   60yoM with h/o chronic Systolic CHF (EF 98%), CAD s/p MI,  ICD, COPD, OSA on CPAP, HTN, Hypothyroidism. Multiple recent hospitalizations admitted with recurrent respiratory failure and large left pleural effusion. Now s/p thoracentesis (transudative)   Assessment/Plan   1. Acute on chronic systolic HF due to iCM - EF 15-20% by echo 6/30. Moderate RV dysfunction. Viewed personally - Chronic NYHA IIIb-IV.  - RHC 7/2with compensated hemodynamics after diuresis.  - Volume status stable off diuretics. Continue current meds.   - Continue spiro 12.5 mg daily - Continue digoxin 0.125 mg daily - Continue  losartan 25 mg daily. BP soft in 100 range so will not up-titrate or switch to [employer].  - Continue coreg 3.125 mg BID.   2. CAD s/p previous MI - Cath 7/1 with severe 3 v CAD with multiple areas of ISR - Dr. Haroldine Laws has reviewed films with Dr. Burt Knack and Dr. Prescott Gum - Suspect anterior wall is non-viable given previous LAD stent thrombosis.  - Awaiting  24-hour thallium (has ICD so no MRI). Dr. Haroldine Laws has arranged with [facility] to have performed starting today. If anterior wall viable will consider CABG. If non-viable consider high-risk PCI of RCA and large OM-1 - Continue ASA, statin, Brillinta  3. Acute on chronic hypoxic respiratory failure with large left pleural effusion - Much improved after thoracentesis.  - Per report report tap was bloody but effusion is transudative by Light's criteria. Cytology only with mesothelial cells commonly seen in HF. Dr. Haroldine Laws reviewed with Dr. Titus Mould  - Repeat CT 6/30 showed moderate residual effusion.  - If recurs may need Pleurex. Currently asymptomatic.    - Desats with exertion. Will likely need home O2. Continue to follow.  - Continue pulmonary toilet.  - Being treated for levaquin x 8 days for possible PNA per primary. No change. Afebrile.   4. COPD - Stable. Per primary.   5. Mediastinal lymphadenopathy on chest CT - ? Related to HF or other underlying process. LDH ok. Consider outpatient PET.  No change.   6. Failure to thrive/severe deconditioning - PT has seen. Functional capacity improving.  - Recommending SNF.    8. Hypokalemia - K 4.0 this am.   Pt NPO for thallium uptake study.     Length of Stay: [address]  Eli, Pattillo  02/18/2017, 9:24 AM  Advanced Heart Failure Team Pager [phone] (M-F; 7a - 4p)  Please contact [facility] for night-coverage after hours (4p -7a ) and weekends on [email or web address]  Patient seen and examined with the above-signed Advanced Practice Provider and/or Housestaff. I personally reviewed laboratory data, imaging studies and relevant notes. I independently examined the patient and formulated the important aspects of the plan. I have edited the note to reflect any of my changes or salient points. I have personally discussed the plan with the patient and/or family.  Volume status stable. No CP. Ambulating with PT. Has had first part of viability study. Will await 24 hour thallium images to assess for viability to decide CABG  vs 2v PCI.   Glori Bickers, MD  5:09 PM

## 2017-02-18 NOTE — Progress Notes (Signed)
Patient for Thallium stress test today.  He confirmed he is aware is not to eat anything prior to test.  Patient's fall risk score is 10 and per Fall's Bundle, requires bed alarm.  Patient refused bed alarm.  Instructed him to call for assistance anytime.  My phone # on board and explained to patient and call bell and bedside table within reach.

## 2017-02-18 NOTE — Progress Notes (Signed)
PT Cancellation Note  Patient Details Name: Stephen Cardenas MRN: [id number] DOB: 1955/10/18   Cancelled Treatment:    Reason Eval/Treat Not Completed: Patient at procedure or test/unavailable PT will continue to follow acutely.    Salina April, PTA Pager: [phone]   02/18/2017, 2:06 PM

## 2017-02-18 NOTE — Progress Notes (Signed)
Patient stated he is stable when he walks and denies dizziness.  Patient's fall risk score changed to 9 per fall risk score, so no bed alarm required.  Patient updated.

## 2017-02-19 LAB — BASIC METABOLIC PANEL
Anion gap: 5 (ref 5–15)
BUN: 15 mg/dL (ref 6–20)
CALCIUM: 8.5 mg/dL — AB (ref 8.9–10.3)
CO2: 25 mmol/L (ref 22–32)
CREATININE: 0.78 mg/dL (ref 0.61–1.24)
Chloride: 103 mmol/L (ref 101–111)
GFR calc Af Amer: >60 mL/min (ref >60)
GFR calc non Af Amer: >60 mL/min (ref >60)
Glucose, Bld: 96 mg/dL (ref 65–99)
Potassium: 4.1 mmol/L (ref 3.5–5.1)
SODIUM: 133 mmol/L — AB (ref 135–145)

## 2017-02-19 LAB — GLUCOSE, CAPILLARY
Glucose-Capillary: 105 mg/dL — ABNORMAL HIGH (ref 65–99)
Glucose-Capillary: 105 mg/dL — ABNORMAL HIGH (ref 65–99)
Glucose-Capillary: 98 mg/dL (ref 65–99)
Glucose-Capillary: 97 mg/dL (ref 65–99)

## 2017-02-19 NOTE — Progress Notes (Addendum)
RN informed by nuclear med yesterday AM that Thallium stress test would be for 2 days. Patient and day RN also confirmed this to this RN during evening shift change. Pt made NPO this morning until further notice. Will continue to monitor.

## 2017-02-19 NOTE — Progress Notes (Addendum)
CARDIAC REHAB PHASE I   PRE:  Rate/Rhythm: 91SR  BP:   Sitting: 96/71     SaO2: 96% 2L  MODE:  Ambulation: 460 ft   POST:  Rate/Rhythm: 97 SR  BP:   Sitting: 120/73     SaO2: 98% 2L 1355-1430 Pt ambulated 463ft, with one person A and with 2L of O2. Pt maintained a steady gait and pace. Pt was returned to recliner with VSS. Pt denied s/s of CP, SOB or dizziness. Left pt at recliner with call bell and telephone in reach.  Uzair Godley D Luiscarlos Kaczmarczyk,MS,ACSM-RCEP 02/19/2017 2:27 PM

## 2017-02-19 NOTE — Progress Notes (Signed)
PROGRESS NOTE    Stephen Cardenas  ZTI:[id number] DOB: 12-20-1955 DOA: 02/08/2017 PCP: System, Pcp Not In    Brief Narrative: 61 year old with severe vessel CAD, severe ischemic cardiomyopathy, with LVEF of 15%, hypertension, hypothyroidism, OSA on CPAP, COPD, s/p AICD,  presented with DOE, sepsis, with para pneumonic effusion.   Assessment & Plan:   Active Problems:   Acute respiratory failure with hypoxia (HCC)   Ventilator dependent (HCC)   Shortness of breath   SOB (shortness of breath)   Pleural effusion   Acute on chronic systolic (congestive) heart failure (HCC)   CAD in native artery   Acute respiratory failure with hypoxia, secondary to acute on chronic systolic CHF, left pleural effusion,  Suspect parapneumonia effusion vs transudate from CHF, .  Diuresed and his breathing has improved. Has diuresed about 9.5  liters since admission.  Cardiac cath showed severe three vessel disease and multiple areas of in stent restenosis.  Work up with Thallium viability test( its a two day test) , if anterior wall viable with possibly consider CABG vs PCI if non viable, as per cardiology. CTVS consulted and recommendations given.  Resume aspirin, statin , spironolactone 12.5 mg daily, coreg.  S/p thoracentesis. Cytology showing mesothelial cells. Completed the course of antibiotics. Urine for strep and legionella antigen negative.  Repeat CXR shows Persistent abnormal opacity at the left lung base best consistent with effusion, pneumonia, and possibly atelectasis. He is afebrile, no leukocytosis, does not look toxic, dry cough,  would not start him on antibiotics.   Elevated troponins: Demand ischemia with CHF.    COPD:  No wheezing heard, resume pulmicort and brovana.  Continue with duoneb.  No changes in medications.    AKI; resolved.   Anemia:  Normocytic.  Hemoglobin stable at 9.6.      Hypertension:  Controlled.  Resume cozaar.    Hyperlipidemia:  Resume Crestor.     Hypothyroidism: Resume synthroid.   Mediastinal adenopathy seen on CT,  Reactive? , recommend outpatient PET scan.   Mild hyponatremia asymptomatic.      DVT prophylaxis: lovenox.  Code Status: (Full Family Communication: none at bedside.  Disposition Plan: home with home PT when stable , currently awaiting further work up.    Consultants:   HF team.   PCCM.   CTVS   Procedures:   Left sided thoracentesis  Echocardiogram  Left and right heart catheterization  24 hour thallium viability study.    Antimicrobials: completed the course of antibiotics.    Subjective: No new complaints.  Dry cough  Objective: Vitals:   02/18/17 2030 02/18/17 2139 02/19/17 0547 02/19/17 0747  BP: 91/63  112/72   Pulse: (!) 104  78   Resp: 18  18   Temp: 98.4 F (36.9 C)  98.4 F (36.9 C)   TempSrc: Oral  Oral   SpO2: 100% 99% 98% 98%  Weight:   72.2 kg (159 lb 3.2 oz)   Height:        Intake/Output Summary (Last 24 hours) at 02/19/17 0808 Last data filed at 02/19/17 0552  Gross per 24 hour  Intake              360 ml  Output              975 ml  Net             -615 ml   Filed Weights   02/17/17 0500 02/18/17 0525 02/19/17 0547  Weight: 72 kg (  158 lb 12.8 oz) 72.2 kg (159 lb 3.2 oz) 72.2 kg (159 lb 3.2 oz)    Examination:  General exam:comfortable. Not in any distress. On 2 lit of [place] oxygen.  Respiratory system: air entry fair, no wheezing or rhonchi.  Cardiovascular system: S1 & S2 heard, RRR, no murmers.  Gastrointestinal system: abd is sof tnon tender non distended bowel sounds heard.  Central nervous system: Alert and oriented. Non focal.  Extremities: Symmetric 5 x 5 power. No cyanosis or clubbing.  Skin: No rashes, lesions or ulcers Psychiatry: Judgement and insight appear normal. Mood & affect appropriate.     Data Reviewed: I have personally reviewed following labs and imaging studies  CBC:  Recent Labs Lab 02/14/17 0612  WBC 8.8  HGB  9.6*  HCT 31.8*  MCV 86.6  PLT 497   Basic Metabolic Panel:  Recent Labs Lab 02/15/17 0430 02/16/17 0222 02/17/17 0401 02/18/17 0250 02/19/17 0533  NA 134* 135 134* 134* 133*  K 4.3 4.2 4.3 4.0 4.1  CL 101 99* 100* 102 103  CO2 28 28 28 26 25   GLUCOSE 103* 86 112* 108* 96  BUN 22* 18 18 18 15   CREATININE 1.20 1.14 0.96 0.83 0.78  CALCIUM 8.4* 8.7* 8.6* 8.6* 8.5*   GFR: Estimated Creatinine Clearance: 97.8 mL/min (by C-G formula based on SCr of 0.78 mg/dL). Liver Function Tests:  Recent Labs Lab 02/18/17 0250  AST 44*  ALT 20  ALKPHOS 98  BILITOT 1.3*  PROT 6.1*  ALBUMIN 2.7*   No results for input(s): LIPASE, AMYLASE in the last 168 hours. No results for input(s): AMMONIA in the last 168 hours. Coagulation Profile:  Recent Labs Lab 02/14/17 0612  INR 1.29   Cardiac Enzymes:  Recent Labs Lab 02/12/17 1221  TROPONINI 0.09*   BNP (last 3 results) No results for input(s): PROBNP in the last 8760 hours. HbA1C: No results for input(s): HGBA1C in the last 72 hours. CBG:  Recent Labs Lab 02/18/17 0750 02/18/17 1316 02/18/17 1617 02/18/17 1700 02/18/17 2134  GLUCAP 98 91 75 122* 92   Lipid Profile: No results for input(s): CHOL, HDL, LDLCALC, TRIG, CHOLHDL, LDLDIRECT in the last 72 hours. Thyroid Function Tests: No results for input(s): TSH, T4TOTAL, FREET4, T3FREE, THYROIDAB in the last 72 hours. Anemia Panel: No results for input(s): VITAMINB12, FOLATE, FERRITIN, TIBC, IRON, RETICCTPCT in the last 72 hours. Sepsis Labs: No results for input(s): PROCALCITON, LATICACIDVEN in the last 168 hours.  No results found for this or any previous visit (from the past 240 hour(s)).       Radiology Studies: Dg Chest Port 1 View  Result Date: 02/17/2017 CLINICAL DATA:  Pacemaker insertion, shortness of breath EXAM: PORTABLE CHEST 1 VIEW COMPARISON:  CT chest of 02/12/2017 and portable chest x-ray of same day FINDINGS: There is persistent abnormal opacity  at the left lung base consistent with left pleural effusion, and probable left basilar pneumonia and atelectasis. The right lung is clear. Mediastinal and hilar contours are unchanged and cardiomegaly is stable. Permanent pacemaker is present with AICD lead. No pneumothorax is seen. IMPRESSION: 1. Persistent abnormal opacity at the left lung base best consistent with effusion, pneumonia, and possibly atelectasis. 2. Stable cardiomegaly.  Permanent pacemaker with AICD lead. Electronically Signed   By: Ivar Drape M.D.   On: 02/17/2017 15:26        Scheduled Meds: . arformoterol  15 mcg Nebulization BID  . aspirin EC  325 mg Oral Daily  . budesonide (  PULMICORT) nebulizer solution  0.5 mg Nebulization BID  . carvedilol  3.125 mg Oral BID WC  . cholecalciferol  2,000 Units Oral Daily  . digoxin  0.125 mg Oral Daily  . docusate sodium  100 mg Oral Daily  . DULoxetine  60 mg Oral Daily  . enoxaparin (LOVENOX) injection  40 mg Subcutaneous Q24H  . ferrous sulfate  325 mg Oral BID WC  . insulin aspart  0-5 Units Subcutaneous QHS  . insulin aspart  0-9 Units Subcutaneous TID WC  . ipratropium-albuterol  3 mL Nebulization BID  . levothyroxine  150 mcg Oral QAC breakfast  . losartan  25 mg Oral Daily  . mupirocin ointment   Nasal BID  . pantoprazole  40 mg Oral QHS  . rosuvastatin  20 mg Oral q1800  . sodium chloride flush  3 mL Intravenous Q12H  . spironolactone  12.5 mg Oral Daily  . vitamin B-12  2,000 mcg Oral Daily   Continuous Infusions: . sodium chloride       LOS: 11 days    Time spent: 39 min    Yaneth Fairbairn, MD [facility] Pager [phone]  If 7PM-7AM, please contact night-coverage [email or web address] Password [email or web address] 02/19/2017, 8:08 AM

## 2017-02-19 NOTE — Progress Notes (Signed)
Advanced Heart Failure Rounding Note  Primary Cardiologist: New  Subjective:    L/RHC y7/1 with severe 3 v CAD with multiple areas of in-stent re-stenosis, severe iCM with EF 15% in setting of previous large anterior MI, and relatively well-compensated hemodynamics after diuresis. Full report below.   Lying in better. Says breathing much better. No CP or orthopnea. Thallium study for viablility in process  [facility] 02/14/17  Mid RCA-2 lesion, 80 %stenosed.  Mid RCA-1 lesion, 90 %stenosed.  Prox RCA-2 lesion, 50 %stenosed.  Prox RCA-1 lesion, 30 %stenosed.  Dist RCA lesion, 30 %stenosed.  2nd Mrg lesion, 95 %stenosed.  Mid LAD to Dist LAD lesion, 95 %stenosed.  Dist LAD lesion, 30 %stenosed.  2nd Diag lesion, 99 %stenosed.  There is severe left ventricular systolic dysfunction.   Findings:  Ao = 108/78 (91) LV = 108/20 RA =  6 RV = 54/9 PA = 57/33 (41) PCW = 19 Fick cardiac output/index = 5.9/3.1 PVR = 3.7 WU SVR = 1150  Ao sat = 94% PA sat = 58%, 60%  Assessment: 1. Severe 3 v CAD with multiple areas of in-stent restenosis 2. Severe iCM with EF 15% in setting of previous large anterior MI 3. Relatively well-compensated hemodynamics after diuresis  Objective:   Weight Range: 72.2 kg (159 lb 3.2 oz) Body mass index is 22.84 kg/m.   Vital Signs:   Temp:  [97.5 F (36.4 C)-98.4 F (36.9 C)] 98.4 F (36.9 C) (07/07 0547) Pulse Rate:  [78-104] 78 (07/07 0547) Resp:  [18] 18 (07/07 0547) BP: (91-129)/(63-91) 112/72 (07/07 0547) SpO2:  [96 %-100 %] 98 % (07/07 0547) Weight:  [72.2 kg (159 lb 3.2 oz)] 72.2 kg (159 lb 3.2 oz) (07/07 0547) Last BM Date: 02/17/17  Weight change: Filed Weights   02/17/17 0500 02/18/17 0525 02/19/17 0547  Weight: 72 kg (158 lb 12.8 oz) 72.2 kg (159 lb 3.2 oz) 72.2 kg (159 lb 3.2 oz)    Intake/Output:   Intake/Output Summary (Last 24 hours) at 02/19/17 0627 Last data filed at 02/19/17 0552  Gross per 24 hour  Intake               360 ml  Output             1125 ml  Net             -765 ml      Physical Exam    General: Lying in bed No resp difficulty HEENT: normal Neck: supple. no JVD. Carotids 2+ bilat; no bruits. No lymphadenopathy or thryomegaly appreciated. Cor: PMI laterally displaced. Regular rate & rhythm. No rubs, gallops or murmurs. Lungs: clear decreased at bases  Abdomen: soft, nontender, nondistended. No hepatosplenomegaly. No bruits or masses. Good bowel sounds. Extremities: no cyanosis, clubbing, rash, edema Neuro: alert & orientedx3, cranial nerves grossly intact. moves all 4 extremities w/o difficulty. Affect pleasant   Telemetry   NSR 70-80s Personally reviewed   EKG    N/A  Labs    CBC No results for input(s): WBC, NEUTROABS, HGB, HCT, MCV, PLT in the last 72 hours. Basic Metabolic Panel  Recent Labs  02/17/17 0401 02/18/17 0250  NA 134* 134*  K 4.3 4.0  CL 100* 102  CO2 28 26  GLUCOSE 112* 108*  BUN 18 18  CREATININE 0.96 0.83  CALCIUM 8.6* 8.6*   Liver Function Tests  Recent Labs  02/18/17 0250  AST 44*  ALT 20  ALKPHOS 98  BILITOT 1.3*  PROT 6.1*  ALBUMIN 2.7*   No results for input(s): LIPASE, AMYLASE in the last 72 hours. Cardiac Enzymes No results for input(s): CKTOTAL, CKMB, CKMBINDEX, TROPONINI in the last 72 hours.  BNP: BNP (last 3 results)  Recent Labs  02/12/17 1221  BNP >4,500.0*    ProBNP (last 3 results) No results for input(s): PROBNP in the last 8760 hours.   D-Dimer No results for input(s): DDIMER in the last 72 hours. Hemoglobin A1C No results for input(s): HGBA1C in the last 72 hours. Fasting Lipid Panel No results for input(s): CHOL, HDL, LDLCALC, TRIG, CHOLHDL, LDLDIRECT in the last 72 hours. Thyroid Function Tests No results for input(s): TSH, T4TOTAL, T3FREE, THYROIDAB in the last 72 hours.  Invalid input(s): FREET3  Other results:   Imaging    No results found.   Medications:     Scheduled  Medications: . arformoterol  15 mcg Nebulization BID  . aspirin EC  325 mg Oral Daily  . budesonide (PULMICORT) nebulizer solution  0.5 mg Nebulization BID  . carvedilol  3.125 mg Oral BID WC  . cholecalciferol  2,000 Units Oral Daily  . digoxin  0.125 mg Oral Daily  . docusate sodium  100 mg Oral Daily  . DULoxetine  60 mg Oral Daily  . enoxaparin (LOVENOX) injection  40 mg Subcutaneous Q24H  . ferrous sulfate  325 mg Oral BID WC  . insulin aspart  0-5 Units Subcutaneous QHS  . insulin aspart  0-9 Units Subcutaneous TID WC  . ipratropium-albuterol  3 mL Nebulization BID  . levothyroxine  150 mcg Oral QAC breakfast  . losartan  25 mg Oral Daily  . mupirocin ointment   Nasal BID  . pantoprazole  40 mg Oral QHS  . rosuvastatin  20 mg Oral q1800  . sodium chloride flush  3 mL Intravenous Q12H  . spironolactone  12.5 mg Oral Daily  . vitamin B-12  2,000 mcg Oral Daily    Infusions: . sodium chloride      PRN Medications: sodium chloride, acetaminophen, albuterol, ALPRAZolam, ondansetron (ZOFRAN) IV, sodium chloride flush    Patient Profile   60yoM with h/o chronic Systolic CHF (EF 42%), CAD s/p MI, ICD, COPD, OSA on CPAP, HTN, Hypothyroidism. Multiple recent hospitalizations admitted with recurrent respiratory failure and large left pleural effusion. Now s/p thoracentesis (transudative)   Assessment/Plan   1. Acute on chronic systolic HF due to iCM - EF 15-20% by echo 6/30. Moderate RV dysfunction. Viewed personally - Chronic NYHA IIIb-IV.  - RHC 7/2 with compensated hemodynamics after diuresis.  - Volume status stable off diuretics. Continue current meds.  Can restart lasix soon as weight goes up  - Continue spiro 12.5 mg daily - Continue digoxin 0.125 mg daily - Continue losartan 25 mg daily. SBP 91-110 so will not titrate yet.  - Continue coreg 3.125 mg BID.   2. CAD s/p previous MI - Cath 7/1 with severe 3 v CAD with multiple areas of ISR - I have reviewed films  with Dr. Burt Knack and Dr. Prescott Gum - Suspect anterior wall is non-viable given previous LAD stent thrombosis.  - Awaiting  24-hour thallium (has ICD so no MRI) - will complete today. If anterior wall viable will consider CABG. If non-viable consider high-risk PCI of RCA and large OM-1. D/w Dr. Prescott Gum - Continue ASA, statin. Off Brillinta due to possibility of CABG  3. Acute on chronic hypoxic respiratory failure with large left pleural effusion - Much improved after thoracentesis.  -  Per report report tap was bloody but effusion is transudative by Light's criteria. Cytology only with mesothelial cells commonly seen in HF. Dr. Haroldine Laws reviewed with Dr. Titus Mould  - Repeat CT 6/30 showed moderate residual effusion.  - F/u C/X on 7/5 with small to moderate residual effusion. (no change) Personally reviewed - Desats with exertion. Will likely need home O2. Continue to follow.  - Continue pulmonary toilet.  - Completed  levaquin x 8 days for possible PNA per primary. No change. Afebrile.   4. COPD - Stable. Per primary.   5. Mediastinal lymphadenopathy on chest CT - ? Related to HF or other underlying process. LDH ok. Consider outpatient PET. No change.   6. Failure to thrive/severe deconditioning - Improving rapidly with PT  8. Hypokalemia - K 4.1 this am.   Length of Stay: 11  Glori Bickers, MD  02/19/2017, 6:27 AM  Advanced Heart Failure Team Pager [phone] (M-F; 7a - 4p)  Please contact [facility] for night-coverage after hours (4p -7a ) and weekends on [email or web address]

## 2017-02-20 LAB — BASIC METABOLIC PANEL
ANION GAP: 6 (ref 5–15)
BUN: 14 mg/dL (ref 6–20)
CALCIUM: 8.5 mg/dL — AB (ref 8.9–10.3)
CO2: 25 mmol/L (ref 22–32)
CREATININE: 0.74 mg/dL (ref 0.61–1.24)
Chloride: 103 mmol/L (ref 101–111)
Glucose, Bld: 95 mg/dL (ref 65–99)
Potassium: 4.2 mmol/L (ref 3.5–5.1)
Sodium: 134 mmol/L — ABNORMAL LOW (ref 135–145)

## 2017-02-20 LAB — GLUCOSE, CAPILLARY
GLUCOSE-CAPILLARY: 131 mg/dL — AB (ref 65–99)
Glucose-Capillary: 106 mg/dL — ABNORMAL HIGH (ref 65–99)
Glucose-Capillary: 100 mg/dL — ABNORMAL HIGH (ref 65–99)
Glucose-Capillary: 123 mg/dL — ABNORMAL HIGH (ref 65–99)

## 2017-02-20 MED ORDER — TICAGRELOR 90 MG PO TABS
90.0000 mg | ORAL_TABLET | Freq: Two times a day (BID) | ORAL | Status: DC
  Administered 2017-02-20 – 2017-02-23 (×6): 90 mg via ORAL
  Filled 2017-02-20 (×6): qty 1

## 2017-02-20 MED ORDER — ASPIRIN EC 81 MG PO TBEC
81.0000 mg | DELAYED_RELEASE_TABLET | Freq: Every day | ORAL | Status: DC
  Administered 2017-02-21 – 2017-02-23 (×2): 81 mg via ORAL
  Filled 2017-02-20 (×2): qty 1

## 2017-02-20 MED ORDER — LOSARTAN POTASSIUM 50 MG PO TABS
25.0000 mg | ORAL_TABLET | Freq: Two times a day (BID) | ORAL | Status: DC
  Administered 2017-02-20 – 2017-02-23 (×8): 25 mg via ORAL
  Filled 2017-02-20 (×8): qty 1

## 2017-02-20 NOTE — Progress Notes (Signed)
PROGRESS NOTE    Stephen Cardenas  MVH:[id number] DOB: 1956-02-08 DOA: 02/08/2017 PCP: System, Pcp Not In    Brief Narrative: 61 year old with severe vessel CAD, severe ischemic cardiomyopathy, with LVEF of 15%, hypertension, hypothyroidism, OSA on CPAP, COPD, s/p AICD,  presented with DOE, sepsis, with para pneumonic effusion. He underwent thoracentesis and repeat CXR shows some persistent opacity. He completed the course of antibiotics for possible para pneumonic effusion. He also underwent 24 hour thallium viability test, which showed no viability except for the septum. Further recommendations from cardiology to follow.   Assessment & Plan:   Active Problems:   Acute respiratory failure with hypoxia (HCC)   Ventilator dependent (HCC)   Shortness of breath   SOB (shortness of breath)   Pleural effusion   Acute on chronic systolic (congestive) heart failure (HCC)   CAD in native artery   Acute respiratory failure with hypoxia, secondary to acute on chronic systolic CHF, left pleural effusion,  Suspect parapneumonia effusion vs transudate from CHF, .  Diuresed and his breathing has improved. Has diuresed about 10.5  liters since admission.  Cardiac cath showed severe three vessel disease and multiple areas of in stent restenosis.  Work up with Thallium viability test( its a two day test) shows non viability except the septum. Cardiology with discuss with CTVS and decide the plan of treatment. CTVS consulted and recommendations given.  Resume aspirin, statin , spironolactone 12.5 mg daily, coreg. Brillinta held for possible CABG,.  S/p thoracentesis. Cytology showing mesothelial cells. Completed the course of antibiotics. Urine for strep and legionella antigen negative.  Repeat CXR shows Persistent abnormal opacity at the left lung base best consistent with effusion, pneumonia, and possibly atelectasis. He is afebrile, no leukocytosis, does not look toxic, dry cough,  would not start him on  antibiotics.   Elevated troponins: Demand ischemia with CHF.    COPD:  No wheezing heard, resume pulmicort and brovana.  Continue with duoneb.  No changes in medications.    AKI; resolved.   Anemia:  Normocytic.  Hemoglobin stable at 9.6.      Hypertension:  Controlled.  Resume cozaar.    Hyperlipidemia:  Resume Crestor.    Hypothyroidism: Resume synthroid.   Mediastinal adenopathy seen on CT,  Reactive? , recommend outpatient PET scan.   Mild hyponatremia asymptomatic.      DVT prophylaxis: lovenox.  Code Status: (Full Family Communication: none at bedside.  Disposition Plan: home with home PT when stable , currently awaiting cardiology recommendations.    Consultants:   HF team.   PCCM.   CTVS   Procedures:   Left sided thoracentesis  Echocardiogram  Left and right heart catheterization  24 hour thallium viability study.    Antimicrobials: completed the course of antibiotics.    Subjective: No new complaints.  Dry cough the same.  No chest pain or sob.   Objective: Vitals:   02/19/17 2107 02/20/17 0640 02/20/17 0738 02/20/17 1013  BP: 122/82 117/80    Pulse: 95 88  98  Resp: 18 18    Temp: 98.6 F (37 C) 98.2 F (36.8 C)    TempSrc: Oral Oral    SpO2: 99% 96% 99%   Weight:  72.1 kg (159 lb)    Height:        Intake/Output Summary (Last 24 hours) at 02/20/17 1118 Last data filed at 02/20/17 1016  Gross per 24 hour  Intake  1080 ml  Output             1375 ml  Net             -295 ml   Filed Weights   02/18/17 0525 02/19/17 0547 02/20/17 0640  Weight: 72.2 kg (159 lb 3.2 oz) 72.2 kg (159 lb 3.2 oz) 72.1 kg (159 lb)    Examination:  General exam:comfortable. Not in any distress. On 2 lit of [place] oxygen.  Respiratory system: air entry fair, no wheezing or rhonchi. No rales.  Cardiovascular system: S1 & S2 heard, RRR, no murmers. No pedal edema Gastrointestinal system: abd is soft non tender non distended  bowel sounds heard.  Central nervous system: Alert and oriented. Non focal.  Extremities: Symmetric 5 x 5 power. No cyanosis or clubbing.  Skin: No rashes, lesions or ulcers Psychiatry: Judgement and insight appear normal. Mood & affect appropriate.     Data Reviewed: I have personally reviewed following labs and imaging studies  CBC:  Recent Labs Lab 02/14/17 0612  WBC 8.8  HGB 9.6*  HCT 31.8*  MCV 86.6  PLT 195   Basic Metabolic Panel:  Recent Labs Lab 02/16/17 0222 02/17/17 0401 02/18/17 0250 02/19/17 0533 02/20/17 0202  NA 135 134* 134* 133* 134*  K 4.2 4.3 4.0 4.1 4.2  CL 99* 100* 102 103 103  CO2 28 28 26 25 25   GLUCOSE 86 112* 108* 96 95  BUN 18 18 18 15 14   CREATININE 1.14 0.96 0.83 0.78 0.74  CALCIUM 8.7* 8.6* 8.6* 8.5* 8.5*   GFR: Estimated Creatinine Clearance: 97.6 mL/min (by C-G formula based on SCr of 0.74 mg/dL). Liver Function Tests:  Recent Labs Lab 02/18/17 0250  AST 44*  ALT 20  ALKPHOS 98  BILITOT 1.3*  PROT 6.1*  ALBUMIN 2.7*   No results for input(s): LIPASE, AMYLASE in the last 168 hours. No results for input(s): AMMONIA in the last 168 hours. Coagulation Profile:  Recent Labs Lab 02/14/17 0612  INR 1.29   Cardiac Enzymes: No results for input(s): CKTOTAL, CKMB, CKMBINDEX, TROPONINI in the last 168 hours. BNP (last 3 results) No results for input(s): PROBNP in the last 8760 hours. HbA1C: No results for input(s): HGBA1C in the last 72 hours. CBG:  Recent Labs Lab 02/19/17 0825 02/19/17 1126 02/19/17 1655 02/19/17 2106 02/20/17 0754  GLUCAP 105* 105* 98 97 106*   Lipid Profile: No results for input(s): CHOL, HDL, LDLCALC, TRIG, CHOLHDL, LDLDIRECT in the last 72 hours. Thyroid Function Tests: No results for input(s): TSH, T4TOTAL, FREET4, T3FREE, THYROIDAB in the last 72 hours. Anemia Panel: No results for input(s): VITAMINB12, FOLATE, FERRITIN, TIBC, IRON, RETICCTPCT in the last 72 hours. Sepsis Labs: No  results for input(s): PROCALCITON, LATICACIDVEN in the last 168 hours.  No results found for this or any previous visit (from the past 240 hour(s)).       Radiology Studies: Nm Myocar Multi W/spect W/wall Motion / Ef  Result Date: 02/19/2017 : CARDIAC VIABILITY STUDY COMPARISON:  NONE HISTORY: Reported severe three-vessel coronary artery disease with a left ventricular ejection fraction of 15%. Cardiac catheterization demonstrated multiple areas of in-stent restenoses. Evaluate for viability, particularly in the anterior wall. TECHNIQUE: A standard protocol was performed with 3.8 millicuries of thallium 201 with imaging 15 minutes after injection. The patient was re- dosed with 1.1 millicuries thallium 093 with delayed 4 and 24 hour imaging. FINDINGS: Large fixed defects, seen at all 3 time points, are identified in the  anterior, inferior, and inferolateral walls. There is mild uptake in the anterolateral wall at all 3 time points. There is uptake throughout much of the anterior septal wall from apex to mid wall at all 3 time points. There is mild decreased uptake in the anterior septal wall towards the base on immediate images with mild improvement on delayed images. There is a defect in the inferior septal wall from mid wall to base which improves on delayed images. However, there is GI activity adjacent to the inferior septal wall towards the base which increases throughout the study. IMPRESSION: 1. Large infarcts involve the anterior, inferior, and inferolateral walls with no viability identified in these regions. 2. Decreased uptake in the septal wall from mid wall to base on immediate images with improvement on delayed images may represent a relatively small region of hibernating viable myocardium in the septum towards the base. However, adjacent GI activity which increases throughout the study may partially explain the partial reversibility in the inferior septal wall. 3. Preserved myocardium is  identified in the septal wall from apex to mid wall and in a small portion of the anterolateral wall. Electronically Signed   By: Dorise Bullion III M.D   On: 02/19/2017 17:36        Scheduled Meds: . arformoterol  15 mcg Nebulization BID  . aspirin EC  325 mg Oral Daily  . budesonide (PULMICORT) nebulizer solution  0.5 mg Nebulization BID  . carvedilol  3.125 mg Oral BID WC  . cholecalciferol  2,000 Units Oral Daily  . digoxin  0.125 mg Oral Daily  . docusate sodium  100 mg Oral Daily  . DULoxetine  60 mg Oral Daily  . enoxaparin (LOVENOX) injection  40 mg Subcutaneous Q24H  . ferrous sulfate  325 mg Oral BID WC  . insulin aspart  0-5 Units Subcutaneous QHS  . insulin aspart  0-9 Units Subcutaneous TID WC  . ipratropium-albuterol  3 mL Nebulization BID  . levothyroxine  150 mcg Oral QAC breakfast  . losartan  25 mg Oral BID  . mupirocin ointment   Nasal BID  . pantoprazole  40 mg Oral QHS  . rosuvastatin  20 mg Oral q1800  . sodium chloride flush  3 mL Intravenous Q12H  . spironolactone  12.5 mg Oral Daily  . ticagrelor  90 mg Oral BID  . vitamin B-12  2,000 mcg Oral Daily   Continuous Infusions: . sodium chloride       LOS: 12 days    Time spent: 20 min    Dory Verdun, MD [facility] Pager [phone]  If 7PM-7AM, please contact night-coverage [email or web address] Password [email or web address] 02/20/2017, 11:18 AM

## 2017-02-20 NOTE — Progress Notes (Signed)
Advanced Heart Failure Rounding Note  Primary Cardiologist: New  Subjective:    L/RHC y7/1 with severe 3 v CAD with multiple areas of in-stent re-stenosis, severe iCM with EF 15% in setting of previous large anterior MI, and relatively well-compensated hemodynamics after diuresis. Full report below.   Denies CP or SOB. Walking halls. Weight stable.   24-hour Thallium viability study reviewed personally and minimal or no viability in all segments except for septum   [facility] 02/14/17  Mid RCA-2 lesion, 80 %stenosed.  Mid RCA-1 lesion, 90 %stenosed.  Prox RCA-2 lesion, 50 %stenosed.  Prox RCA-1 lesion, 30 %stenosed.  Dist RCA lesion, 30 %stenosed.  2nd Mrg lesion, 95 %stenosed.  Mid LAD to Dist LAD lesion, 95 %stenosed.  Dist LAD lesion, 30 %stenosed.  2nd Diag lesion, 99 %stenosed.  There is severe left ventricular systolic dysfunction.   Findings:  Ao = 108/78 (91) LV = 108/20 RA =  6 RV = 54/9 PA = 57/33 (41) PCW = 19 Fick cardiac output/index = 5.9/3.1 PVR = 3.7 WU SVR = 1150  Ao sat = 94% PA sat = 58%, 60%  Assessment: 1. Severe 3 v CAD with multiple areas of in-stent restenosis 2. Severe iCM with EF 15% in setting of previous large anterior MI 3. Relatively well-compensated hemodynamics after diuresis  Objective:   Weight Range: 72.1 kg (159 lb) Body mass index is 22.81 kg/m.   Vital Signs:   Temp:  [98.2 F (36.8 C)-98.6 F (37 C)] 98.2 F (36.8 C) (07/08 0640) Pulse Rate:  [81-95] 88 (07/08 0640) Resp:  [18] 18 (07/08 0640) BP: (115-128)/(67-90) 117/80 (07/08 0640) SpO2:  [93 %-99 %] 99 % (07/08 0738) Weight:  [72.1 kg (159 lb)] 72.1 kg (159 lb) (07/08 0640) Last BM Date: 02/20/17  Weight change: Filed Weights   02/18/17 0525 02/19/17 0547 02/20/17 0640  Weight: 72.2 kg (159 lb 3.2 oz) 72.2 kg (159 lb 3.2 oz) 72.1 kg (159 lb)    Intake/Output:   Intake/Output Summary (Last 24 hours) at 02/20/17 0847 Last data filed at 02/20/17  0833  Gross per 24 hour  Intake             1020 ml  Output             1425 ml  Net             -405 ml      Physical Exam    General:  Lying flat in bed No resp difficulty HEENT: normal Neck: supple. JVP 5-6 . Carotids 2+ bilat; no bruits. No lymphadenopathy or thryomegaly appreciated. Cor: PMI laterally displaced. Regular rate & rhythm. No rubs, gallops or murmurs. Lungs: clear decreased at bases  Abdomen: soft, nontender, nondistended. No hepatosplenomegaly. No bruits or masses. Good bowel sounds. Extremities: no cyanosis, clubbing, rash, edema Neuro: alert & orientedx3, cranial nerves grossly intact. moves all 4 extremities w/o difficulty. Affect pleasant   Telemetry   NSR 80s. Personally reviewed   EKG    N/A  Labs    CBC No results for input(s): WBC, NEUTROABS, HGB, HCT, MCV, PLT in the last 72 hours. Basic Metabolic Panel  Recent Labs  02/19/17 0533 02/20/17 0202  NA 133* 134*  K 4.1 4.2  CL 103 103  CO2 25 25  GLUCOSE 96 95  BUN 15 14  CREATININE 0.78 0.74  CALCIUM 8.5* 8.5*   Liver Function Tests  Recent Labs  02/18/17 0250  AST 44*  ALT 20  ALKPHOS 98  BILITOT 1.3*  PROT 6.1*  ALBUMIN 2.7*   No results for input(s): LIPASE, AMYLASE in the last 72 hours. Cardiac Enzymes No results for input(s): CKTOTAL, CKMB, CKMBINDEX, TROPONINI in the last 72 hours.  BNP: BNP (last 3 results)  Recent Labs  02/12/17 1221  BNP >4,500.0*    ProBNP (last 3 results) No results for input(s): PROBNP in the last 8760 hours.   D-Dimer No results for input(s): DDIMER in the last 72 hours. Hemoglobin A1C No results for input(s): HGBA1C in the last 72 hours. Fasting Lipid Panel No results for input(s): CHOL, HDL, LDLCALC, TRIG, CHOLHDL, LDLDIRECT in the last 72 hours. Thyroid Function Tests No results for input(s): TSH, T4TOTAL, T3FREE, THYROIDAB in the last 72 hours.  Invalid input(s): FREET3  Other results:   Imaging    Nm Myocar Multi  W/spect W/wall Motion / Ef  Result Date: 02/19/2017 : CARDIAC VIABILITY STUDY COMPARISON:  NONE HISTORY: Reported severe three-vessel coronary artery disease with a left ventricular ejection fraction of 15%. Cardiac catheterization demonstrated multiple areas of in-stent restenoses. Evaluate for viability, particularly in the anterior wall. TECHNIQUE: A standard protocol was performed with 3.8 millicuries of thallium 201 with imaging 15 minutes after injection. The patient was re- dosed with 1.1 millicuries thallium 852 with delayed 4 and 24 hour imaging. FINDINGS: Large fixed defects, seen at all 3 time points, are identified in the anterior, inferior, and inferolateral walls. There is mild uptake in the anterolateral wall at all 3 time points. There is uptake throughout much of the anterior septal wall from apex to mid wall at all 3 time points. There is mild decreased uptake in the anterior septal wall towards the base on immediate images with mild improvement on delayed images. There is a defect in the inferior septal wall from mid wall to base which improves on delayed images. However, there is GI activity adjacent to the inferior septal wall towards the base which increases throughout the study. IMPRESSION: 1. Large infarcts involve the anterior, inferior, and inferolateral walls with no viability identified in these regions. 2. Decreased uptake in the septal wall from mid wall to base on immediate images with improvement on delayed images may represent a relatively small region of hibernating viable myocardium in the septum towards the base. However, adjacent GI activity which increases throughout the study may partially explain the partial reversibility in the inferior septal wall. 3. Preserved myocardium is identified in the septal wall from apex to mid wall and in a small portion of the anterolateral wall. Electronically Signed   By: Dorise Bullion III M.D   On: 02/19/2017 17:36     Medications:      Scheduled Medications: . arformoterol  15 mcg Nebulization BID  . aspirin EC  325 mg Oral Daily  . budesonide (PULMICORT) nebulizer solution  0.5 mg Nebulization BID  . carvedilol  3.125 mg Oral BID WC  . cholecalciferol  2,000 Units Oral Daily  . digoxin  0.125 mg Oral Daily  . docusate sodium  100 mg Oral Daily  . DULoxetine  60 mg Oral Daily  . enoxaparin (LOVENOX) injection  40 mg Subcutaneous Q24H  . ferrous sulfate  325 mg Oral BID WC  . insulin aspart  0-5 Units Subcutaneous QHS  . insulin aspart  0-9 Units Subcutaneous TID WC  . ipratropium-albuterol  3 mL Nebulization BID  . levothyroxine  150 mcg Oral QAC breakfast  . losartan  25 mg Oral Daily  .  mupirocin ointment   Nasal BID  . pantoprazole  40 mg Oral QHS  . rosuvastatin  20 mg Oral q1800  . sodium chloride flush  3 mL Intravenous Q12H  . spironolactone  12.5 mg Oral Daily  . vitamin B-12  2,000 mcg Oral Daily    Infusions: . sodium chloride      PRN Medications: sodium chloride, acetaminophen, albuterol, ALPRAZolam, ondansetron (ZOFRAN) IV, sodium chloride flush    Patient Profile   60yoM with h/o chronic Systolic CHF (EF 45%), CAD s/p MI, ICD, COPD, OSA on CPAP, HTN, Hypothyroidism. Multiple recent hospitalizations admitted with recurrent respiratory failure and large left pleural effusion. Now s/p thoracentesis (transudative)   Assessment/Plan   1. Acute on chronic systolic HF due to iCM - EF 15-20% by echo 6/30. Moderate RV dysfunction. Viewed personally - Chronic NYHA IIIb-IV.  - RHC 7/2 with compensated hemodynamics after diuresis.  - Volume status stable off diuretics. Continue current meds.  Can restart lasix soon as weight goes up  - Continue spiro 12.5 mg daily - Continue digoxin 0.125 mg daily - Increase losartan to 25 bid. Watch BP closely.  - Continue coreg 3.125 mg BID.   2. CAD s/p previous MI - Cath 7/1 with severe 3 v CAD with multiple areas of ISR - I have reviewed films with  Dr. Burt Knack and Dr. Prescott Gum - Suspect anterior wall is non-viable given previous LAD stent thrombosis.  - 24-hour Thallium viability study reviewed personally and minimal or no viability in all segments except for septum - I will review with Drs. Cooper and [employer]. He will not be a surgical candidate. Will discuss medical therapy vs targeted PCI to viable segments - Continue ASA, statin. Off Brillinta due to possibility of CABG  3. Acute on chronic hypoxic respiratory failure with large left pleural effusion - Much improved after thoracentesis.  - Per report report tap was bloody but effusion is transudative by Light's criteria. Cytology only with mesothelial cells commonly seen in HF. Dr. Haroldine Laws reviewed with Dr. Titus Mould  - Repeat CT 6/30 showed moderate residual effusion.  - F/u C/X on 7/5 with small to moderate residual effusion. (no change) Personally reviewed - Desats with exertion. Will likely need home O2. Continue to follow.  - Continue pulmonary toilet.  - Completed  levaquin x 8 days for possible PNA per primary. No change. Afebrile.   4. COPD - Stable. Per primary.   5. Mediastinal lymphadenopathy on chest CT - ? Related to HF or other underlying process. LDH ok. Consider outpatient PET. No change.   6. Failure to thrive/severe deconditioning - Improving rapidly with PT. Walked 460 feet with CR yesterday  8. Hypokalemia - K 4.2 this am.   Length of Stay: 12  Glori Bickers, MD  02/20/2017, 8:47 AM  Advanced Heart Failure Team Pager [phone] (M-F; 7a - 4p)  Please contact [facility] for night-coverage after hours (4p -7a ) and weekends on [email or web address]

## 2017-02-21 ENCOUNTER — Other Ambulatory Visit (HOSPITAL_COMMUNITY): Payer: Medicare Other

## 2017-02-21 LAB — CBC
HEMATOCRIT: 33 % — AB (ref 39.0–52.0)
Hemoglobin: 10.4 g/dL — ABNORMAL LOW (ref 13.0–17.0)
MCH: 27 pg (ref 26.0–34.0)
MCHC: 31.5 g/dL (ref 30.0–36.0)
MCV: 85.7 fL (ref 78.0–100.0)
Platelets: 136 10*3/uL — ABNORMAL LOW (ref 150–400)
RBC: 3.85 MIL/uL — AB (ref 4.22–5.81)
RDW: 18.2 % — ABNORMAL HIGH (ref 11.5–15.5)
WBC: 5.4 10*3/uL (ref 4.0–10.5)

## 2017-02-21 LAB — BASIC METABOLIC PANEL
Anion gap: 7 (ref 5–15)
BUN: 13 mg/dL (ref 6–20)
CHLORIDE: 103 mmol/L (ref 101–111)
CO2: 24 mmol/L (ref 22–32)
CREATININE: 0.66 mg/dL (ref 0.61–1.24)
Calcium: 8.9 mg/dL (ref 8.9–10.3)
GFR calc non Af Amer: >60 mL/min (ref >60)
Glucose, Bld: 92 mg/dL (ref 65–99)
POTASSIUM: 4.1 mmol/L (ref 3.5–5.1)
SODIUM: 134 mmol/L — AB (ref 135–145)

## 2017-02-21 LAB — MAGNESIUM: Magnesium: 1.7 mg/dL (ref 1.7–2.4)

## 2017-02-21 LAB — GLUCOSE, CAPILLARY
GLUCOSE-CAPILLARY: 93 mg/dL (ref 65–99)
GLUCOSE-CAPILLARY: 116 mg/dL — AB (ref 65–99)
GLUCOSE-CAPILLARY: 67 mg/dL (ref 65–99)
GLUCOSE-CAPILLARY: 112 mg/dL — AB (ref 65–99)

## 2017-02-21 LAB — PROTIME-INR
INR: 0.97
Prothrombin Time: 12.8 seconds (ref 11.4–15.2)

## 2017-02-21 MED ORDER — SPIRONOLACTONE 25 MG PO TABS
25.0000 mg | ORAL_TABLET | Freq: Every day | ORAL | Status: DC
  Administered 2017-02-21 – 2017-02-23 (×3): 25 mg via ORAL
  Filled 2017-02-21 (×3): qty 1

## 2017-02-21 NOTE — Progress Notes (Signed)
PROGRESS NOTE    Stephen Cardenas  SWN:[id number] DOB: 1955-09-19 DOA: 02/08/2017 PCP: System, Pcp Not In    Brief Narrative: 61 year old with severe vessel CAD, severe ischemic cardiomyopathy, with LVEF of 15%, hypertension, hypothyroidism, OSA on CPAP, COPD, s/p AICD,  presented with DOE, sepsis, with para pneumonic effusion. He underwent thoracentesis and repeat CXR shows some persistent opacity. He completed the course of antibiotics for possible para pneumonic effusion. He also underwent 24 hour thallium viability test, which showed no viability except for the septum. Plan for 2 V PCI in am by Dr Burt Knack.   Assessment & Plan:   Active Problems:   Acute respiratory failure with hypoxia (HCC)   Ventilator dependent (HCC)   Shortness of breath   SOB (shortness of breath)   Pleural effusion   Acute on chronic systolic (congestive) heart failure (HCC)   CAD in native artery   Acute respiratory failure with hypoxia, secondary to acute on chronic systolic CHF, left pleural effusion,  Suspect parapneumonia effusion vs transudate from CHF, .  Diuresed and his breathing has improved. Has diuresed about 10.5  liters since admission.  Cardiac cath showed severe three vessel disease and multiple areas of in stent restenosis.  Work up with Thallium viability test( its a two day test) shows non viability except the septum. Plan for PCI in am by Dr Burt Knack.  Resume aspirin, statin , spironolactone 12.5 mg daily, coreg. Brillinta held for possible CABG,.  S/p thoracentesis. Cytology showing mesothelial cells. Completed the course of antibiotics. Urine for strep and legionella antigen negative.  Repeat CXR shows Persistent abnormal opacity at the left lung base best consistent with effusion, pneumonia, and possibly atelectasis. He is afebrile, no leukocytosis, does not look toxic, dry cough,  would not start him on antibiotics.   Elevated troponins: Demand ischemia with CHF.    COPD:  No wheezing  heard, resume pulmicort and brovana.  Continue with duoneb.  No changes in medications.  Off oxygen today.    AKI; resolved.   Anemia:  Normocytic.  Hemoglobin stable at 9.6.      Hypertension:  Controlled.  Resume cozaar.    Hyperlipidemia:  Resume Crestor.    Hypothyroidism: Resume synthroid.   Mediastinal adenopathy seen on CT,  Reactive? , recommend outpatient PET scan.   Mild hyponatremia asymptomatic.      DVT prophylaxis: lovenox.  Code Status: (Full Family Communication: none at bedside.  Disposition Plan:home after PCI in am.    Consultants:   HF team.   PCCM.   CTVS   Procedures:   Left sided thoracentesis  Echocardiogram  Left and right heart catheterization  24 hour thallium viability study.    Antimicrobials: completed the course of antibiotics.    Subjective: reprots feeling good no new complaints.   Objective: Vitals:   02/21/17 0701 02/21/17 1029 02/21/17 1109 02/21/17 1115  BP: 123/88     Pulse: 100 94    Resp: 18     Temp: 97.7 F (36.5 C)     TempSrc: Oral     SpO2: 99%  99% 99%  Weight: 71.6 kg (157 lb 12.8 oz)     Height:        Intake/Output Summary (Last 24 hours) at 02/21/17 1201 Last data filed at 02/21/17 0900  Gross per 24 hour  Intake              940 ml  Output  1675 ml  Net             -735 ml   Filed Weights   02/19/17 0547 02/20/17 0640 02/21/17 0701  Weight: 72.2 kg (159 lb 3.2 oz) 72.1 kg (159 lb) 71.6 kg (157 lb 12.8 oz)    Examination: stable.   General exam:comfortable. Sleeping in the bed, off oxygen.  Respiratory system: air entry fair, no wheezing or rhonchi. No rales.  Cardiovascular system: S1 & S2 heard, RRR, no murmers. No pedal edema Gastrointestinal system: abd is soft non tender non distended bowel sounds heard.  Central nervous system: Alert and oriented. Non focal.  Extremities: Symmetric 5 x 5 power. No cyanosis or clubbing.  Skin: No rashes, lesions or  ulcers Psychiatry: Judgement and insight appear normal. Mood & affect appropriate.     Data Reviewed: I have personally reviewed following labs and imaging studies  CBC:  Recent Labs Lab 02/21/17 0509  WBC 5.4  HGB 10.4*  HCT 33.0*  MCV 85.7  PLT 878*   Basic Metabolic Panel:  Recent Labs Lab 02/17/17 0401 02/18/17 0250 02/19/17 0533 02/20/17 0202 02/21/17 0509  NA 134* 134* 133* 134* 134*  K 4.3 4.0 4.1 4.2 4.1  CL 100* 102 103 103 103  CO2 28 26 25 25 24   GLUCOSE 112* 108* 96 95 92  BUN 18 18 15 14 13   CREATININE 0.96 0.83 0.78 0.74 0.66  CALCIUM 8.6* 8.6* 8.5* 8.5* 8.9  MG  --   --   --   --  1.7   GFR: Estimated Creatinine Clearance: 97 mL/min (by C-G formula based on SCr of 0.66 mg/dL). Liver Function Tests:  Recent Labs Lab 02/18/17 0250  AST 44*  ALT 20  ALKPHOS 98  BILITOT 1.3*  PROT 6.1*  ALBUMIN 2.7*   No results for input(s): LIPASE, AMYLASE in the last 168 hours. No results for input(s): AMMONIA in the last 168 hours. Coagulation Profile: No results for input(s): INR, PROTIME in the last 168 hours. Cardiac Enzymes: No results for input(s): CKTOTAL, CKMB, CKMBINDEX, TROPONINI in the last 168 hours. BNP (last 3 results) No results for input(s): PROBNP in the last 8760 hours. HbA1C: No results for input(s): HGBA1C in the last 72 hours. CBG:  Recent Labs Lab 02/20/17 1127 02/20/17 1639 02/20/17 2202 02/21/17 0725 02/21/17 1111  GLUCAP 131* 100* 123* 93 116*   Lipid Profile: No results for input(s): CHOL, HDL, LDLCALC, TRIG, CHOLHDL, LDLDIRECT in the last 72 hours. Thyroid Function Tests: No results for input(s): TSH, T4TOTAL, FREET4, T3FREE, THYROIDAB in the last 72 hours. Anemia Panel: No results for input(s): VITAMINB12, FOLATE, FERRITIN, TIBC, IRON, RETICCTPCT in the last 72 hours. Sepsis Labs: No results for input(s): PROCALCITON, LATICACIDVEN in the last 168 hours.  No results found for this or any previous visit (from the  past 240 hour(s)).       Radiology Studies: Nm Myocar Multi W/spect W/wall Motion / Ef  Result Date: 02/19/2017 : CARDIAC VIABILITY STUDY COMPARISON:  NONE HISTORY: Reported severe three-vessel coronary artery disease with a left ventricular ejection fraction of 15%. Cardiac catheterization demonstrated multiple areas of in-stent restenoses. Evaluate for viability, particularly in the anterior wall. TECHNIQUE: A standard protocol was performed with 3.8 millicuries of thallium 201 with imaging 15 minutes after injection. The patient was re- dosed with 1.1 millicuries thallium 676 with delayed 4 and 24 hour imaging. FINDINGS: Large fixed defects, seen at all 3 time points, are identified in the anterior, inferior, and  inferolateral walls. There is mild uptake in the anterolateral wall at all 3 time points. There is uptake throughout much of the anterior septal wall from apex to mid wall at all 3 time points. There is mild decreased uptake in the anterior septal wall towards the base on immediate images with mild improvement on delayed images. There is a defect in the inferior septal wall from mid wall to base which improves on delayed images. However, there is GI activity adjacent to the inferior septal wall towards the base which increases throughout the study. IMPRESSION: 1. Large infarcts involve the anterior, inferior, and inferolateral walls with no viability identified in these regions. 2. Decreased uptake in the septal wall from mid wall to base on immediate images with improvement on delayed images may represent a relatively small region of hibernating viable myocardium in the septum towards the base. However, adjacent GI activity which increases throughout the study may partially explain the partial reversibility in the inferior septal wall. 3. Preserved myocardium is identified in the septal wall from apex to mid wall and in a small portion of the anterolateral wall. Electronically Signed   By: Dorise Bullion III M.D   On: 02/19/2017 17:36        Scheduled Meds: . arformoterol  15 mcg Nebulization BID  . aspirin EC  81 mg Oral Daily  . budesonide (PULMICORT) nebulizer solution  0.5 mg Nebulization BID  . carvedilol  3.125 mg Oral BID WC  . cholecalciferol  2,000 Units Oral Daily  . digoxin  0.125 mg Oral Daily  . docusate sodium  100 mg Oral Daily  . DULoxetine  60 mg Oral Daily  . enoxaparin (LOVENOX) injection  40 mg Subcutaneous Q24H  . ferrous sulfate  325 mg Oral BID WC  . insulin aspart  0-5 Units Subcutaneous QHS  . insulin aspart  0-9 Units Subcutaneous TID WC  . levothyroxine  150 mcg Oral QAC breakfast  . losartan  25 mg Oral BID  . mupirocin ointment   Nasal BID  . pantoprazole  40 mg Oral QHS  . rosuvastatin  20 mg Oral q1800  . sodium chloride flush  3 mL Intravenous Q12H  . spironolactone  25 mg Oral Daily  . ticagrelor  90 mg Oral BID  . vitamin B-12  2,000 mcg Oral Daily   Continuous Infusions: . sodium chloride       LOS: 13 days    Time spent: 25 min    Oral Hallgren, MD [facility] Pager [phone]  If 7PM-7AM, please contact night-coverage [email or web address] Password [email or web address] 02/21/2017, 12:01 PM

## 2017-02-21 NOTE — Progress Notes (Signed)
Advanced Heart Failure Rounding Note  Primary Cardiologist: New  Subjective:    L/RHC y7/1 with severe 3 v CAD with multiple areas of in-stent re-stenosis, severe iCM with EF 15% in setting of previous large anterior MI, and relatively well-compensated hemodynamics after diuresis. Full report below.   Feeling good today. Denies SOB, CP, lightheadedness or dizziness. Walking halls without difficulty. Weight down 2 lbs.   24-hour Thallium viability study reviewed personally and minimal or no viability in all segments except for septum   [facility] 02/14/17  Mid RCA-2 lesion, 80 %stenosed.  Mid RCA-1 lesion, 90 %stenosed.  Prox RCA-2 lesion, 50 %stenosed.  Prox RCA-1 lesion, 30 %stenosed.  Dist RCA lesion, 30 %stenosed.  2nd Mrg lesion, 95 %stenosed.  Mid LAD to Dist LAD lesion, 95 %stenosed.  Dist LAD lesion, 30 %stenosed.  2nd Diag lesion, 99 %stenosed.  There is severe left ventricular systolic dysfunction.   Findings:  Ao = 108/78 (91) LV = 108/20 RA =  6 RV = 54/9 PA = 57/33 (41) PCW = 19 Fick cardiac output/index = 5.9/3.1 PVR = 3.7 WU SVR = 1150  Ao sat = 94% PA sat = 58%, 60%  Assessment: 1. Severe 3 v CAD with multiple areas of in-stent restenosis 2. Severe iCM with EF 15% in setting of previous large anterior MI 3. Relatively well-compensated hemodynamics after diuresis  Objective:   Weight Range: 157 lb 12.8 oz (71.6 kg) Body mass index is 22.64 kg/m.   Vital Signs:   Temp:  [97.7 F (36.5 C)-98.3 F (36.8 C)] 97.7 F (36.5 C) (07/09 0701) Pulse Rate:  [87-100] 100 (07/09 0701) Resp:  [18-20] 18 (07/09 0701) BP: (107-124)/(47-88) 123/88 (07/09 0701) SpO2:  [98 %-99 %] 99 % (07/09 0701) Weight:  [157 lb 12.8 oz (71.6 kg)] 157 lb 12.8 oz (71.6 kg) (07/09 0701) Last BM Date: 02/20/17  Weight change: Filed Weights   02/19/17 0547 02/20/17 0640 02/21/17 0701  Weight: 159 lb 3.2 oz (72.2 kg) 159 lb (72.1 kg) 157 lb 12.8 oz (71.6 kg)     Intake/Output:   Intake/Output Summary (Last 24 hours) at 02/21/17 0844 Last data filed at 02/21/17 0702  Gross per 24 hour  Intake              940 ml  Output             1625 ml  Net             -685 ml      Physical Exam    General: Well appearing. No resp difficulty.Lying flat in bed without orthopnea.  HEENT: Normal Neck: Supple. JVP 5-6. Carotids 2+ bilat; no bruits. No thyromegaly or nodule noted. Cor: PMI nondisplaced. RRR, No M/G/R noted Lungs: CTAB, normal effort. Abdomen: Soft, non-tender, non-distended, no HSM. No bruits or masses. +BS  Extremities: No cyanosis, clubbing, rash, R and LLE no edema.  Neuro: Alert & orientedx3, cranial nerves grossly intact. moves all 4 extremities w/o difficulty. Affect pleasant   Telemetry   Personally reviewed, NSR 80s   EKG    N/A  Labs    CBC  Recent Labs  02/21/17 0509  WBC 5.4  HGB 10.4*  HCT 33.0*  MCV 85.7  PLT 161*   Basic Metabolic Panel  Recent Labs  02/20/17 0202 02/21/17 0509  NA 134* 134*  K 4.2 4.1  CL 103 103  CO2 25 24  GLUCOSE 95 92  BUN 14 13  CREATININE 0.74 0.66  CALCIUM 8.5* 8.9  MG  --  1.7   Liver Function Tests No results for input(s): AST, ALT, ALKPHOS, BILITOT, PROT, ALBUMIN in the last 72 hours. No results for input(s): LIPASE, AMYLASE in the last 72 hours. Cardiac Enzymes No results for input(s): CKTOTAL, CKMB, CKMBINDEX, TROPONINI in the last 72 hours.  BNP: BNP (last 3 results)  Recent Labs  02/12/17 1221  BNP >4,500.0*    ProBNP (last 3 results) No results for input(s): PROBNP in the last 8760 hours.   D-Dimer No results for input(s): DDIMER in the last 72 hours. Hemoglobin A1C No results for input(s): HGBA1C in the last 72 hours. Fasting Lipid Panel No results for input(s): CHOL, HDL, LDLCALC, TRIG, CHOLHDL, LDLDIRECT in the last 72 hours. Thyroid Function Tests No results for input(s): TSH, T4TOTAL, T3FREE, THYROIDAB in the last 72  hours.  Invalid input(s): FREET3  Other results:   Imaging    No results found.   Medications:     Scheduled Medications: . arformoterol  15 mcg Nebulization BID  . aspirin EC  81 mg Oral Daily  . budesonide (PULMICORT) nebulizer solution  0.5 mg Nebulization BID  . carvedilol  3.125 mg Oral BID WC  . cholecalciferol  2,000 Units Oral Daily  . digoxin  0.125 mg Oral Daily  . docusate sodium  100 mg Oral Daily  . DULoxetine  60 mg Oral Daily  . enoxaparin (LOVENOX) injection  40 mg Subcutaneous Q24H  . ferrous sulfate  325 mg Oral BID WC  . insulin aspart  0-5 Units Subcutaneous QHS  . insulin aspart  0-9 Units Subcutaneous TID WC  . levothyroxine  150 mcg Oral QAC breakfast  . losartan  25 mg Oral BID  . mupirocin ointment   Nasal BID  . pantoprazole  40 mg Oral QHS  . rosuvastatin  20 mg Oral q1800  . sodium chloride flush  3 mL Intravenous Q12H  . spironolactone  12.5 mg Oral Daily  . ticagrelor  90 mg Oral BID  . vitamin B-12  2,000 mcg Oral Daily    Infusions: . sodium chloride      PRN Medications: sodium chloride, acetaminophen, albuterol, ALPRAZolam, ondansetron (ZOFRAN) IV, sodium chloride flush    Patient Profile   60yoM with h/o chronic Systolic CHF (EF 60%), CAD s/p MI, ICD, COPD, OSA on CPAP, HTN, Hypothyroidism. Multiple recent hospitalizations admitted with recurrent respiratory failure and large left pleural effusion. Now s/p thoracentesis (transudative)  Assessment/Plan   1. Acute on chronic systolic HF due to iCM - EF 15-20% by echo 6/30. Moderate RV dysfunction. Personally reviewed.  - Chronic NYHA IIIb-IV.  - RHC 7/2 with compensated hemodynamics after diuresis.  - Volume status stable off diuretics.   - Increase spiro to 25 mg daily.  - Continue digoxin 0.125 mg daily - Continue losartan 25 mg BID. BP stable.   - Continue coreg 3.125 mg BID.   2. CAD s/p previous MI - Cath 7/1 with severe 3 v CAD with multiple areas of ISR - Dr.  Haroldine Laws has reviewed films with Dr. Burt Knack and Dr. Prescott Gum - Suspect anterior wall is non-viable given previous LAD stent thrombosis.  - 24-hour Thallium viability study reviewed personally and minimal or no viability in all segments except for septum - Dr. Haroldine Laws to review with Drs. Cooper and [employer]. He will not be a surgical candidate. Will further discuss medical therapy vs targeted PCI to viable segments - Continue ASA, statin. Off Brillinta due  to possibility of CABG - Possibly PCI tomorrow.   3. Acute on chronic hypoxic respiratory failure with large left pleural effusion - Much improved after thoracentesis.  - Per report report tap was bloody but effusion is transudative by Light's criteria. Cytology only with mesothelial cells commonly seen in HF. Dr. Haroldine Laws reviewed with Dr. Titus Mould  - Repeat CT 6/30 showed moderate residual effusion.  - F/u C/X on 7/5 with small to moderate residual effusion. (no change) Personally reviewed.  - Desats with exertion. Will likely need home O2. Continue to follow.  - Continue pulmonary toilet.  - Completed  levaquin x 8 days for possible PNA per primary. Afebrile. No change.   4. COPD - Stable. Per primary.    5. Mediastinal lymphadenopathy on chest CT - ? Related to HF or other underlying process. LDH ok. Consider outpatient PET.  - No change.    6. Failure to thrive/severe deconditioning - Continues to improve. Walked 740 feet with CR this am. (Walked 460 feet Saturday)   8. Hypokalemia - K 4.1 this am.   - Meds as above.  Length of Stay: [address]  Floy, Riegler  02/21/2017, 8:44 AM  Advanced Heart Failure Team Pager [phone] (M-F; 7a - 4p)  Please contact [facility] for night-coverage after hours (4p -7a ) and weekends on [email or web address]  Patient seen and examined with the above-signed Advanced Practice Provider and/or Housestaff. I personally reviewed laboratory data, imaging studies and relevant notes. I  independently examined the patient and formulated the important aspects of the plan. I have edited the note to reflect any of my changes or salient points. I have personally discussed the plan with the patient and/or family.  Remains stable from HF and CAD perspective. Films and viability study reviewed with Dr. Burt Knack. Plan for 2v PCI tomorrow am with Dr. Burt Knack.   Glori Bickers, MD  11:31 AM

## 2017-02-21 NOTE — Progress Notes (Signed)
CARDIAC REHAB PHASE I   PRE:  Rate/Rhythm: 99 SR  BP:  Sitting: 116/83        SaO2: 99 RA  MODE:  Ambulation: 740 ft   POST:  Rate/Rhythm: 115 ST  BP:  Sitting: 111/81         SaO2: 100 RA  Pt ambulated 740 ft on RA, hand held assist, mostly steady gait, tolerated well with no complaints other than "my legs get tired." Pt to bed for nap per pt request after walk, call bell within reach. Encouraged additional ambulation today. Will follow.   [phone] Lenna Sciara, RN, BSN 02/21/2017 8:31 AM

## 2017-02-21 NOTE — Progress Notes (Signed)
Physical Therapy Treatment Patient Details Name: Stephen Cardenas MRN: [id number] DOB: 02/20/1956 Today's Date: 02/21/2017    History of Present Illness 61 year old male with chronic systolic CHF (EF of 93%), CAD with history of MI, AICD, COPD, OSA on CPAP, hypertension, hypothyroidism, recently treated for HCT AP and multiple recent hospitalization who was discharged from [facility] 3 weeks back and then subsequently admitted to [facility], discharged to rehabilitation from there to rehabilitation with failure to thrive presented to the ED with increasing shortness of breath. EMS was called and patient taken to [facility] and placed on BiPAP. Chest x-ray showed large lateral left pleural effusion. He received IV Zosyn and Levaquin in the hospital. CT chest was unable to be obtained since patient unable to lie down flat. He was then referred to critical care at [facility] and transferred here for further management. Patient admitted for sepsis possibly secondary to left parapneumonic effusion.    PT Comments    Pt is making good progress towards his goals. Pt in ambulating on RA with SaO2 >93% throughout. Pt currently, independent with bed mobility and supervision for transfers and ambulation of 800 feet without an AD. Pt requires skilled PT to progress ambulation and improve strength and endurance to prepare for possible surgical intervention.      Follow Up Recommendations  SNF;Supervision/Assistance - 24 hour (If pt declines SNF, HHPT recommended)     Equipment Recommendations  3in1 (PT) (home O2)    Recommendations for Other Services       Precautions / Restrictions Precautions Precautions: Fall Restrictions Weight Bearing Restrictions: No    Mobility  Bed Mobility Overal bed mobility: Independent                Transfers Overall transfer level: Needs assistance Equipment used: None Transfers: Sit to/from Stand Sit to Stand:  Supervision         General transfer comment: supervision for safety  Ambulation/Gait Ambulation/Gait assistance: Supervision Ambulation Distance (Feet): 800 Feet Assistive device: None Gait Pattern/deviations: Step-through pattern;Decreased stride length;Trunk flexed Gait velocity: slowed Gait velocity interpretation: Below normal speed for age/gender General Gait Details: supervision for safety          Balance Overall balance assessment: Needs assistance Sitting-balance support: No upper extremity supported;Feet unsupported Sitting balance-Leahy Scale: Good     Standing balance support: No upper extremity supported Standing balance-Leahy Scale: Good Standing balance comment: stands statically without UE support                            Cognition Arousal/Alertness: Awake/alert Behavior During Therapy: WFL for tasks assessed/performed Overall Cognitive Status: Within Functional Limits for tasks assessed                                           General Comments General comments (skin integrity, edema, etc.): at rest on RA, SaO2 95% O2 and HR 53 bpm, after ambulation on RA SaO2 94% O2 and HR 103 bpm      Pertinent Vitals/Pain Pain Assessment: No/denies pain           PT Goals (current goals can now be found in the care plan section) Acute Rehab PT Goals PT Goal Formulation: With patient Time For Goal Achievement: 02/25/17 Potential to Achieve Goals: Good Progress towards PT goals: Progressing toward goals  Frequency    Min 3X/week      PT Plan Current plan remains appropriate       AM-PAC PT "6 Clicks" Daily Activity  Outcome Measure  Difficulty turning over in bed (including adjusting bedclothes, sheets and blankets)?: None Difficulty moving from lying on back to sitting on the side of the bed? : None Difficulty sitting down on and standing up from a chair with arms (e.g., wheelchair, bedside commode, etc,.)?:  None Help needed moving to and from a bed to chair (including a wheelchair)?: None Help needed walking in hospital room?: None Help needed climbing 3-5 steps with a railing? : A Little 6 Click Score: 23    End of Session Equipment Utilized During Treatment: Gait belt;Oxygen Activity Tolerance: Patient tolerated treatment well Patient left: in bed;with call bell/phone within reach Nurse Communication: Mobility status PT Visit Diagnosis: Muscle weakness (generalized) (M62.81);Unsteadiness on feet (R26.81)     Time: 8616-8372 PT Time Calculation (min) (ACUTE ONLY): 19 min  Charges:  $Gait Training: 8-22 mins                    G Codes:       Stephen Cardenas B. Migdalia Dk PT, DPT Acute Rehabilitation  [phone] Pager [phone]     [place] 02/21/2017, 4:50 PM

## 2017-02-21 NOTE — Progress Notes (Signed)
Received call from central telemetry with notification patient had 5 beats of V tach at 0121.  Pt sleeping at time of rhythm change.  Pt asymptomatic. VSS  BP 118/67, HR 96, resp 18, O2 sat 96% on RA.  MD notified.

## 2017-02-22 ENCOUNTER — Encounter (HOSPITAL_COMMUNITY)
Admission: EM | Disposition: A | Discharge status: Home / Self Care | Payer: Self-pay | Source: Other Acute Inpatient Hospital | Attending: Internal Medicine

## 2017-02-22 ENCOUNTER — Encounter (HOSPITAL_COMMUNITY): Payer: Self-pay | Admitting: Cardiovascular Disease

## 2017-02-22 HISTORY — PX: CORONARY STENT INTERVENTION: CATH118234

## 2017-02-22 LAB — BASIC METABOLIC PANEL
Anion gap: 7 (ref 5–15)
BUN: 14 mg/dL (ref 6–20)
CALCIUM: 8.8 mg/dL — AB (ref 8.9–10.3)
CO2: 24 mmol/L (ref 22–32)
Chloride: 104 mmol/L (ref 101–111)
Creatinine, Ser: 0.68 mg/dL (ref 0.61–1.24)
GFR calc Af Amer: >60 mL/min (ref >60)
GLUCOSE: 102 mg/dL — AB (ref 65–99)
Potassium: 4.2 mmol/L (ref 3.5–5.1)
Sodium: 135 mmol/L (ref 135–145)

## 2017-02-22 LAB — GLUCOSE, CAPILLARY
Glucose-Capillary: 96 mg/dL (ref 65–99)
Glucose-Capillary: 85 mg/dL (ref 65–99)
Glucose-Capillary: 94 mg/dL (ref 65–99)

## 2017-02-22 LAB — CBC
HCT: 32.4 % — ABNORMAL LOW (ref 39.0–52.0)
HEMOGLOBIN: 10.2 g/dL — AB (ref 13.0–17.0)
MCH: 27.1 pg (ref 26.0–34.0)
MCHC: 31.5 g/dL (ref 30.0–36.0)
MCV: 85.9 fL (ref 78.0–100.0)
Platelets: 137 10*3/uL — ABNORMAL LOW (ref 150–400)
RBC: 3.77 MIL/uL — ABNORMAL LOW (ref 4.22–5.81)
RDW: 18.3 % — AB (ref 11.5–15.5)
WBC: 6.2 10*3/uL (ref 4.0–10.5)

## 2017-02-22 LAB — POCT ACTIVATED CLOTTING TIME
Activated Clotting Time: 252 s
Activated Clotting Time: 318 s

## 2017-02-22 SURGERY — CORONARY STENT INTERVENTION
Anesthesia: LOCAL

## 2017-02-22 MED ORDER — MIDAZOLAM HCL 2 MG/2ML IJ SOLN
INTRAMUSCULAR | Status: AC
  Filled 2017-02-22: qty 2

## 2017-02-22 MED ORDER — SODIUM CHLORIDE 0.9% FLUSH: 3.0000 mL | INTRAVENOUS | Status: DC | PRN

## 2017-02-22 MED ORDER — TICAGRELOR 90 MG PO TABS
ORAL_TABLET | ORAL | Status: DC | PRN
  Administered 2017-02-22: 90 mg via ORAL

## 2017-02-22 MED ORDER — SODIUM CHLORIDE 0.9 % IV SOLN: 250.0000 mL | INTRAVENOUS | Status: DC | PRN

## 2017-02-22 MED ORDER — SODIUM CHLORIDE 0.9 % IV SOLN
INTRAVENOUS | Status: DC
  Administered 2017-02-22: 07:00:00 via INTRAVENOUS

## 2017-02-22 MED ORDER — VERAPAMIL HCL 2.5 MG/ML IV SOLN
INTRAVENOUS | Status: AC
  Filled 2017-02-22: qty 2

## 2017-02-22 MED ORDER — ACTIVE PARTNERSHIP FOR HEALTH OF YOUR HEART BOOK
Freq: Once | Status: AC
  Administered 2017-02-22: 21:00:00
  Filled 2017-02-22: qty 1

## 2017-02-22 MED ORDER — LABETALOL HCL 5 MG/ML IV SOLN: 10.0000 mg | INTRAVENOUS | Status: AC | PRN

## 2017-02-22 MED ORDER — LIDOCAINE HCL (PF) 1 % IJ SOLN
INTRAMUSCULAR | Status: DC | PRN
  Administered 2017-02-22: 2 mL

## 2017-02-22 MED ORDER — ANGIOPLASTY BOOK
Freq: Once | Status: AC
  Administered 2017-02-22: 21:00:00
  Filled 2017-02-22: qty 1

## 2017-02-22 MED ORDER — FENTANYL CITRATE (PF) 100 MCG/2ML IJ SOLN
INTRAMUSCULAR | Status: DC | PRN
  Administered 2017-02-22: 25 ug via INTRAVENOUS

## 2017-02-22 MED ORDER — VERAPAMIL HCL 2.5 MG/ML IV SOLN
INTRAVENOUS | Status: DC | PRN
  Administered 2017-02-22: 10 mL via INTRA_ARTERIAL

## 2017-02-22 MED ORDER — SODIUM CHLORIDE 0.9 % IV SOLN: INTRAVENOUS | Status: DC

## 2017-02-22 MED ORDER — HEPARIN SODIUM (PORCINE) 1000 UNIT/ML IJ SOLN
INTRAMUSCULAR | Status: AC
  Filled 2017-02-22: qty 1

## 2017-02-22 MED ORDER — IOPAMIDOL (ISOVUE-370) INJECTION 76%
INTRAVENOUS | Status: AC
  Filled 2017-02-22: qty 50

## 2017-02-22 MED ORDER — TICAGRELOR 90 MG PO TABS
ORAL_TABLET | ORAL | Status: AC
  Filled 2017-02-22: qty 1

## 2017-02-22 MED ORDER — ASPIRIN 81 MG PO CHEW: 81.0000 mg | CHEWABLE_TABLET | ORAL | Status: DC

## 2017-02-22 MED ORDER — HEPARIN SODIUM (PORCINE) 1000 UNIT/ML IJ SOLN
INTRAMUSCULAR | Status: DC | PRN
  Administered 2017-02-22: 3000 [IU] via INTRAVENOUS
  Administered 2017-02-22: 7000 [IU] via INTRAVENOUS

## 2017-02-22 MED ORDER — SODIUM CHLORIDE 0.9 % IV SOLN: INTRAVENOUS | Status: AC

## 2017-02-22 MED ORDER — HYDRALAZINE HCL 20 MG/ML IJ SOLN: 5.0000 mg | INTRAMUSCULAR | Status: AC | PRN

## 2017-02-22 MED ORDER — IOPAMIDOL (ISOVUE-370) INJECTION 76%
INTRAVENOUS | Status: AC
  Filled 2017-02-22: qty 125

## 2017-02-22 MED ORDER — SODIUM CHLORIDE 0.9% FLUSH: 3.0000 mL | Freq: Two times a day (BID) | INTRAVENOUS | Status: DC

## 2017-02-22 MED ORDER — NITROGLYCERIN 1 MG/10 ML FOR IR/CATH LAB
INTRA_ARTERIAL | Status: DC | PRN
  Administered 2017-02-22: 100 ug
  Administered 2017-02-22: 100 ug via INTRACORONARY

## 2017-02-22 MED ORDER — NITROGLYCERIN 1 MG/10 ML FOR IR/CATH LAB
INTRA_ARTERIAL | Status: AC
  Filled 2017-02-22: qty 10

## 2017-02-22 MED ORDER — MIDAZOLAM HCL 2 MG/2ML IJ SOLN
INTRAMUSCULAR | Status: DC | PRN
  Administered 2017-02-22 (×2): 2 mg via INTRAVENOUS

## 2017-02-22 MED ORDER — IOPAMIDOL (ISOVUE-370) INJECTION 76%
INTRAVENOUS | Status: DC | PRN
  Administered 2017-02-22: 125 mL via INTRA_ARTERIAL

## 2017-02-22 MED ORDER — HEPARIN (PORCINE) IN NACL 2-0.9 UNIT/ML-% IJ SOLN
INTRAMUSCULAR | Status: AC | PRN
  Administered 2017-02-22: 1000 mL

## 2017-02-22 MED ORDER — HEPARIN (PORCINE) IN NACL 2-0.9 UNIT/ML-% IJ SOLN
INTRAMUSCULAR | Status: AC
  Filled 2017-02-22: qty 1000

## 2017-02-22 MED ORDER — ASPIRIN 81 MG PO CHEW
81.0000 mg | CHEWABLE_TABLET | ORAL | Status: AC
  Administered 2017-02-22: 81 mg via ORAL
  Filled 2017-02-22: qty 1

## 2017-02-22 MED ORDER — FENTANYL CITRATE (PF) 100 MCG/2ML IJ SOLN
INTRAMUSCULAR | Status: AC
  Filled 2017-02-22: qty 2

## 2017-02-22 MED ORDER — SODIUM CHLORIDE 0.9% FLUSH
3.0000 mL | Freq: Two times a day (BID) | INTRAVENOUS | Status: DC
  Administered 2017-02-22 – 2017-02-23 (×3): 3 mL via INTRAVENOUS

## 2017-02-22 SURGICAL SUPPLY — 22 items
BALLN EUPHORA RX 3.0X15 (BALLOONS) ×2
BALLN SAPPHIRE ~~LOC~~ 3.25X12 (BALLOONS) ×2 IMPLANT
BALLN SAPPHIRE ~~LOC~~ 3.5X15 (BALLOONS) ×2 IMPLANT
BALLN ~~LOC~~ EUPHORA RX 3.75X12 (BALLOONS) ×2
BALLOON EUPHORA RX 3.0X15 (BALLOONS) ×1 IMPLANT
BALLOON ~~LOC~~ EUPHORA RX 3.75X12 (BALLOONS) ×1 IMPLANT
CATH LAUNCHER 6FR AL1 (CATHETERS) ×1 IMPLANT
CATH LAUNCHER 6FR EBU3.5 (CATHETERS) ×2 IMPLANT
CATH LAUNCHER 6FR JR4 (CATHETERS) ×2 IMPLANT
CATHETER LAUNCHER 6FR AL1 (CATHETERS) ×2
DEVICE RAD COMP TR BAND LRG (VASCULAR PRODUCTS) ×2 IMPLANT
GLIDESHEATH SLEND SS 6F .021 (SHEATH) ×2 IMPLANT
GUIDEWIRE INQWIRE 1.5J.035X260 (WIRE) ×1 IMPLANT
INQWIRE 1.5J .035X260CM (WIRE) ×2
KIT ENCORE 26 ADVANTAGE (KITS) ×4 IMPLANT
KIT HEART LEFT (KITS) ×2 IMPLANT
PACK CARDIAC CATHETERIZATION (CUSTOM PROCEDURE TRAY) ×2 IMPLANT
STENT PROMUS PREM MR 3.5X20 (Permanent Stent) ×2 IMPLANT
STENT SYNERGY DES 3X38 (Permanent Stent) ×2 IMPLANT
TRANSDUCER W/STOPCOCK (MISCELLANEOUS) ×2 IMPLANT
TUBING CIL FLEX 10 FLL-RA (TUBING) ×2 IMPLANT
WIRE COUGAR XT STRL 190CM (WIRE) ×2 IMPLANT

## 2017-02-22 NOTE — H&P (View-Only) (Signed)
Advanced Heart Failure Rounding Note  Primary Cardiologist: New  Subjective:    L/RHC y7/1 with severe 3 v CAD with multiple areas of in-stent re-stenosis, severe iCM with EF 15% in setting of previous large anterior MI, and relatively well-compensated hemodynamics after diuresis. Full report below.   Feeling good today. Denies SOB, CP, lightheadedness or dizziness. Walking halls without difficulty. Weight down 2 lbs.   24-hour Thallium viability study reviewed personally and minimal or no viability in all segments except for septum   [facility] 02/14/17  Mid RCA-2 lesion, 80 %stenosed.  Mid RCA-1 lesion, 90 %stenosed.  Prox RCA-2 lesion, 50 %stenosed.  Prox RCA-1 lesion, 30 %stenosed.  Dist RCA lesion, 30 %stenosed.  2nd Mrg lesion, 95 %stenosed.  Mid LAD to Dist LAD lesion, 95 %stenosed.  Dist LAD lesion, 30 %stenosed.  2nd Diag lesion, 99 %stenosed.  There is severe left ventricular systolic dysfunction.   Findings:  Ao = 108/78 (91) LV = 108/20 RA =  6 RV = 54/9 PA = 57/33 (41) PCW = 19 Fick cardiac output/index = 5.9/3.1 PVR = 3.7 WU SVR = 1150  Ao sat = 94% PA sat = 58%, 60%  Assessment: 1. Severe 3 v CAD with multiple areas of in-stent restenosis 2. Severe iCM with EF 15% in setting of previous large anterior MI 3. Relatively well-compensated hemodynamics after diuresis  Objective:   Weight Range: 157 lb 12.8 oz (71.6 kg) Body mass index is 22.64 kg/m.   Vital Signs:   Temp:  [97.7 F (36.5 C)-98.3 F (36.8 C)] 97.7 F (36.5 C) (07/09 0701) Pulse Rate:  [87-100] 100 (07/09 0701) Resp:  [18-20] 18 (07/09 0701) BP: (107-124)/(47-88) 123/88 (07/09 0701) SpO2:  [98 %-99 %] 99 % (07/09 0701) Weight:  [157 lb 12.8 oz (71.6 kg)] 157 lb 12.8 oz (71.6 kg) (07/09 0701) Last BM Date: 02/20/17  Weight change: Filed Weights   02/19/17 0547 02/20/17 0640 02/21/17 0701  Weight: 159 lb 3.2 oz (72.2 kg) 159 lb (72.1 kg) 157 lb 12.8 oz (71.6 kg)     Intake/Output:   Intake/Output Summary (Last 24 hours) at 02/21/17 0844 Last data filed at 02/21/17 0702  Gross per 24 hour  Intake              940 ml  Output             1625 ml  Net             -685 ml      Physical Exam    General: Well appearing. No resp difficulty.Lying flat in bed without orthopnea.  HEENT: Normal Neck: Supple. JVP 5-6. Carotids 2+ bilat; no bruits. No thyromegaly or nodule noted. Cor: PMI nondisplaced. RRR, No M/G/R noted Lungs: CTAB, normal effort. Abdomen: Soft, non-tender, non-distended, no HSM. No bruits or masses. +BS  Extremities: No cyanosis, clubbing, rash, R and LLE no edema.  Neuro: Alert & orientedx3, cranial nerves grossly intact. moves all 4 extremities w/o difficulty. Affect pleasant   Telemetry   Personally reviewed, NSR 80s   EKG    N/A  Labs    CBC  Recent Labs  02/21/17 0509  WBC 5.4  HGB 10.4*  HCT 33.0*  MCV 85.7  PLT 606*   Basic Metabolic Panel  Recent Labs  02/20/17 0202 02/21/17 0509  NA 134* 134*  K 4.2 4.1  CL 103 103  CO2 25 24  GLUCOSE 95 92  BUN 14 13  CREATININE 0.74 0.66  CALCIUM 8.5* 8.9  MG  --  1.7   Liver Function Tests No results for input(s): AST, ALT, ALKPHOS, BILITOT, PROT, ALBUMIN in the last 72 hours. No results for input(s): LIPASE, AMYLASE in the last 72 hours. Cardiac Enzymes No results for input(s): CKTOTAL, CKMB, CKMBINDEX, TROPONINI in the last 72 hours.  BNP: BNP (last 3 results)  Recent Labs  02/12/17 1221  BNP >4,500.0*    ProBNP (last 3 results) No results for input(s): PROBNP in the last 8760 hours.   D-Dimer No results for input(s): DDIMER in the last 72 hours. Hemoglobin A1C No results for input(s): HGBA1C in the last 72 hours. Fasting Lipid Panel No results for input(s): CHOL, HDL, LDLCALC, TRIG, CHOLHDL, LDLDIRECT in the last 72 hours. Thyroid Function Tests No results for input(s): TSH, T4TOTAL, T3FREE, THYROIDAB in the last 72  hours.  Invalid input(s): FREET3  Other results:   Imaging    No results found.   Medications:     Scheduled Medications: . arformoterol  15 mcg Nebulization BID  . aspirin EC  81 mg Oral Daily  . budesonide (PULMICORT) nebulizer solution  0.5 mg Nebulization BID  . carvedilol  3.125 mg Oral BID WC  . cholecalciferol  2,000 Units Oral Daily  . digoxin  0.125 mg Oral Daily  . docusate sodium  100 mg Oral Daily  . DULoxetine  60 mg Oral Daily  . enoxaparin (LOVENOX) injection  40 mg Subcutaneous Q24H  . ferrous sulfate  325 mg Oral BID WC  . insulin aspart  0-5 Units Subcutaneous QHS  . insulin aspart  0-9 Units Subcutaneous TID WC  . levothyroxine  150 mcg Oral QAC breakfast  . losartan  25 mg Oral BID  . mupirocin ointment   Nasal BID  . pantoprazole  40 mg Oral QHS  . rosuvastatin  20 mg Oral q1800  . sodium chloride flush  3 mL Intravenous Q12H  . spironolactone  12.5 mg Oral Daily  . ticagrelor  90 mg Oral BID  . vitamin B-12  2,000 mcg Oral Daily    Infusions: . sodium chloride      PRN Medications: sodium chloride, acetaminophen, albuterol, ALPRAZolam, ondansetron (ZOFRAN) IV, sodium chloride flush    Patient Profile   60yoM with h/o chronic Systolic CHF (EF 83%), CAD s/p MI, ICD, COPD, OSA on CPAP, HTN, Hypothyroidism. Multiple recent hospitalizations admitted with recurrent respiratory failure and large left pleural effusion. Now s/p thoracentesis (transudative)  Assessment/Plan   1. Acute on chronic systolic HF due to iCM - EF 15-20% by echo 6/30. Moderate RV dysfunction. Personally reviewed.  - Chronic NYHA IIIb-IV.  - RHC 7/2 with compensated hemodynamics after diuresis.  - Volume status stable off diuretics.   - Increase spiro to 25 mg daily.  - Continue digoxin 0.125 mg daily - Continue losartan 25 mg BID. BP stable.   - Continue coreg 3.125 mg BID.   2. CAD s/p previous MI - Cath 7/1 with severe 3 v CAD with multiple areas of ISR - Dr.  Haroldine Laws has reviewed films with Dr. Burt Knack and Dr. Prescott Gum - Suspect anterior wall is non-viable given previous LAD stent thrombosis.  - 24-hour Thallium viability study reviewed personally and minimal or no viability in all segments except for septum - Dr. Haroldine Laws to review with Drs. Cooper and [employer]. He will not be a surgical candidate. Will further discuss medical therapy vs targeted PCI to viable segments - Continue ASA, statin. Off Brillinta due  to possibility of CABG - Possibly PCI tomorrow.   3. Acute on chronic hypoxic respiratory failure with large left pleural effusion - Much improved after thoracentesis.  - Per report report tap was bloody but effusion is transudative by Light's criteria. Cytology only with mesothelial cells commonly seen in HF. Dr. Haroldine Laws reviewed with Dr. Titus Mould  - Repeat CT 6/30 showed moderate residual effusion.  - F/u C/X on 7/5 with small to moderate residual effusion. (no change) Personally reviewed.  - Desats with exertion. Will likely need home O2. Continue to follow.  - Continue pulmonary toilet.  - Completed  levaquin x 8 days for possible PNA per primary. Afebrile. No change.   4. COPD - Stable. Per primary.    5. Mediastinal lymphadenopathy on chest CT - ? Related to HF or other underlying process. LDH ok. Consider outpatient PET.  - No change.    6. Failure to thrive/severe deconditioning - Continues to improve. Walked 740 feet with CR this am. (Walked 460 feet Saturday)   8. Hypokalemia - K 4.1 this am.   - Meds as above.  Length of Stay: [address]  Zayin, Valadez  02/21/2017, 8:44 AM  Advanced Heart Failure Team Pager [phone] (M-F; 7a - 4p)  Please contact [facility] for night-coverage after hours (4p -7a ) and weekends on [email or web address]  Patient seen and examined with the above-signed Advanced Practice Provider and/or Housestaff. I personally reviewed laboratory data, imaging studies and relevant notes. I  independently examined the patient and formulated the important aspects of the plan. I have edited the note to reflect any of my changes or salient points. I have personally discussed the plan with the patient and/or family.  Remains stable from HF and CAD perspective. Films and viability study reviewed with Dr. Burt Knack. Plan for 2v PCI tomorrow am with Dr. Burt Knack.   Glori Bickers, MD  11:31 AM

## 2017-02-22 NOTE — Care Management Note (Signed)
Case Management Note  Patient Details  Name: Guled Gahan MRN: [id number] Date of Birth: Nov 30, 1955  Subjective/Objective:  Patient previously set up by previous NCM for [facility] services for [facility] [place] with [facility].  Patient is now s/p  thallium viability study and successful 2v pci, will cont on asa and brilinta  Which he was previously taking at home.  Per physical therapy note patient may need home oxygen.   NCM will await consult for this if needed.                 Action/Plan: NCM will cont to follow for dc needs.   Expected Discharge Date:                  Expected Discharge Plan:  Skilled Nursing Facility  In-House Referral:  Clinical Social Work  Discharge planning Services  CM Consult  Post Acute Care Choice:    Choice offered to:  Patient  DME Arranged:    DME Agency:     HH Arranged:  RN, PT [place] Agency:  [facility] (now [facility])  Status of Service:  In process, will continue to follow  If discussed at Long Length of Stay Meetings, dates discussed:    Additional Comments:  Zenon Mayo, RN 02/22/2017, 10:01 PM

## 2017-02-22 NOTE — Progress Notes (Signed)
PROGRESS NOTE    Stephen Cardenas  LKG:[id number] DOB: 1956/05/16 DOA: 02/08/2017 PCP: System, Pcp Not In    Brief Narrative: 61 year old with severe vessel CAD, severe ischemic cardiomyopathy, with LVEF of 15%, hypertension, hypothyroidism, OSA on CPAP, COPD, s/p AICD,  presented with DOE, sepsis, with para pneumonic effusion. He underwent thoracentesis and repeat CXR shows some persistent opacity. He completed the course of antibiotics for possible para pneumonic effusion. He also underwent 24 hour thallium viability test, which showed no viability except for the septum. Patient underwent 2 vessel PCI with treatment of the severe in stent restenosis in the left circumflex and in the proximal and mid RCA. Cardiology recommended aspirin and ticagrelor for 1 year.   Assessment & Plan:   Active Problems:   Acute respiratory failure with hypoxia (HCC)   Ventilator dependent (HCC)   Shortness of breath   SOB (shortness of breath)   Pleural effusion   Acute on chronic systolic heart failure (HCC)   CAD in native artery   Acute respiratory failure with hypoxia, secondary to acute on chronic systolic CHF, left pleural effusion,  Suspect parapneumonia effusion vs transudate from CHF, .  Diuresed and his breathing has improved. Has diuresed about 12  liters since admission.  Cardiac cath showed severe three vessel disease and multiple areas of in stent restenosis.  Work up with Thallium viability test( its a two day test) shows non viability except the septum. Patient underwent 2 vessel PCI with treatment of the severe in stent restenosis in the left circumflex and in the proximal and mid RCA. Cardiology recommended aspirin and ticagrelor for 1 year. Resume aspirin, statin , spironolactone 12.5 mg daily, coreg.  S/p thoracentesis. Cytology showing mesothelial cells. Completed the course of antibiotics. Urine for strep and legionella antigen negative.  Repeat CXR shows Persistent abnormal opacity at  the left lung base best consistent with effusion, pneumonia, and possibly atelectasis. He is afebrile, no leukocytosis, does not look toxic, would not start him on antibiotics.   Elevated troponins: Demand ischemia with CHF.    COPD:  No wheezing heard, resume pulmicort and brovana.  Continue with duoneb.  No changes in medications.  Off oxygen today.    AKI; resolved.   Anemia:  Normocytic.  Hemoglobin stable between 9 to 10.     Hypertension:  Controlled.  Resume cozaar.    Hyperlipidemia:  Resume Crestor.    Hypothyroidism: Resume synthroid.   Mediastinal adenopathy seen on CT,  Reactive? , recommend outpatient PET scan.   Mild hyponatremia asymptomatic.      DVT prophylaxis: lovenox.  Code Status: (Full Family Communication: none at bedside.  Disposition Plan:home after PCI in am.    Consultants:   HF team.   PCCM.   CTVS   Procedures:   Left sided thoracentesis  Echocardiogram  Left and right heart catheterization  24 hour thallium viability study.   PCI to left circumflex and proximal and mid RCA.   Antimicrobials: completed the course of antibiotics.    Subjective: No new complaints.  Sitting up in the bed.  Objective: Vitals:   02/22/17 0951 02/22/17 0956 02/22/17 1045 02/22/17 1614  BP:    104/75  Pulse: 84 79  89  Resp: (!) 0 (!) 22 20 (!) 21  Temp:   97.7 F (36.5 C) 97.8 F (36.6 C)  TempSrc:   Oral Oral  SpO2: 98% 98%  97%  Weight:      Height:  Intake/Output Summary (Last 24 hours) at 02/22/17 1702 Last data filed at 02/22/17 1614  Gross per 24 hour  Intake            302.5 ml  Output             1050 ml  Net           -747.5 ml   Filed Weights   02/20/17 0640 02/21/17 0701 02/22/17 0550  Weight: 72.1 kg (159 lb) 71.6 kg (157 lb 12.8 oz) 71.6 kg (157 lb 12.8 oz)    Examination:  General exam:comfortable. Sitting up in bed, off oxygen.  Respiratory system: air entry fair, no wheezing or  rhonchi. No rales.  Cardiovascular system: S1 & S2 heard, RRR, no murmers. No pedal edema Gastrointestinal system: abd is soft non tender non distended bowel sounds heard.  Central nervous system: Alert and oriented. Non focal.  Extremities: Symmetric 5 x 5 power. No cyanosis or clubbing.  Skin: No rashes, lesions or ulcers Psychiatry: Judgement and insight appear normal. Mood & affect appropriate.     Data Reviewed: I have personally reviewed following labs and imaging studies  CBC:  Recent Labs Lab 02/21/17 0509 02/22/17 0256  WBC 5.4 6.2  HGB 10.4* 10.2*  HCT 33.0* 32.4*  MCV 85.7 85.9  PLT 136* 448*   Basic Metabolic Panel:  Recent Labs Lab 02/18/17 0250 02/19/17 0533 02/20/17 0202 02/21/17 0509 02/22/17 0256  NA 134* 133* 134* 134* 135  K 4.0 4.1 4.2 4.1 4.2  CL 102 103 103 103 104  CO2 26 25 25 24 24   GLUCOSE 108* 96 95 92 102*  BUN 18 15 14 13 14   CREATININE 0.83 0.78 0.74 0.66 0.68  CALCIUM 8.6* 8.5* 8.5* 8.9 8.8*  MG  --   --   --  1.7  --    GFR: Estimated Creatinine Clearance: 97 mL/min (by C-G formula based on SCr of 0.68 mg/dL). Liver Function Tests:  Recent Labs Lab 02/18/17 0250  AST 44*  ALT 20  ALKPHOS 98  BILITOT 1.3*  PROT 6.1*  ALBUMIN 2.7*   No results for input(s): LIPASE, AMYLASE in the last 168 hours. No results for input(s): AMMONIA in the last 168 hours. Coagulation Profile:  Recent Labs Lab 02/21/17 1710  INR 0.97   Cardiac Enzymes: No results for input(s): CKTOTAL, CKMB, CKMBINDEX, TROPONINI in the last 168 hours. BNP (last 3 results) No results for input(s): PROBNP in the last 8760 hours. HbA1C: No results for input(s): HGBA1C in the last 72 hours. CBG:  Recent Labs Lab 02/21/17 1111 02/21/17 1643 02/21/17 2139 02/22/17 0727 02/22/17 1056  GLUCAP 116* 67 112* 96 85   Lipid Profile: No results for input(s): CHOL, HDL, LDLCALC, TRIG, CHOLHDL, LDLDIRECT in the last 72 hours. Thyroid Function Tests: No  results for input(s): TSH, T4TOTAL, FREET4, T3FREE, THYROIDAB in the last 72 hours. Anemia Panel: No results for input(s): VITAMINB12, FOLATE, FERRITIN, TIBC, IRON, RETICCTPCT in the last 72 hours. Sepsis Labs: No results for input(s): PROCALCITON, LATICACIDVEN in the last 168 hours.  No results found for this or any previous visit (from the past 240 hour(s)).       Radiology Studies: No results found.      Scheduled Meds: . arformoterol  15 mcg Nebulization BID  . aspirin EC  81 mg Oral Daily  . budesonide (PULMICORT) nebulizer solution  0.5 mg Nebulization BID  . carvedilol  3.125 mg Oral BID WC  . cholecalciferol  2,000  Units Oral Daily  . digoxin  0.125 mg Oral Daily  . docusate sodium  100 mg Oral Daily  . DULoxetine  60 mg Oral Daily  . enoxaparin (LOVENOX) injection  40 mg Subcutaneous Q24H  . ferrous sulfate  325 mg Oral BID WC  . insulin aspart  0-5 Units Subcutaneous QHS  . insulin aspart  0-9 Units Subcutaneous TID WC  . levothyroxine  150 mcg Oral QAC breakfast  . losartan  25 mg Oral BID  . mupirocin ointment   Nasal BID  . pantoprazole  40 mg Oral QHS  . rosuvastatin  20 mg Oral q1800  . sodium chloride flush  3 mL Intravenous Q12H  . sodium chloride flush  3 mL Intravenous Q12H  . spironolactone  25 mg Oral Daily  . ticagrelor  90 mg Oral BID  . vitamin B-12  2,000 mcg Oral Daily   Continuous Infusions: . sodium chloride    . sodium chloride       LOS: 14 days    Time spent: 35 min    Shannette Tabares, MD [facility] Pager [phone]  If 7PM-7AM, please contact night-coverage [email or web address] Password [email or web address] 02/22/2017, 5:02 PM

## 2017-02-22 NOTE — Progress Notes (Signed)
Patient sitting at bedside now following toileting. No complaints or acute distress.

## 2017-02-22 NOTE — Interval H&P Note (Signed)
Cath Lab Visit (complete for each Cath Lab visit)  Clinical Evaluation Leading to the Procedure:   ACS: No.  Non-ACS:    Anginal Classification: CCS II  Anti-ischemic medical therapy: Minimal Therapy (1 class of medications)  Non-Invasive Test Results: High-risk stress test findings: cardiac mortality >3%/year  Prior CABG: No previous CABG      History and Physical Interval Note:  02/22/2017 8:19 AM  Stephen Cardenas  has presented today for surgery, with the diagnosis of cad  The various methods of treatment have been discussed with the patient and family. After consideration of risks, benefits and other options for treatment, the patient has consented to  Procedure(s): Coronary Stent Intervention (N/A) as a surgical intervention .  The patient's history has been reviewed, patient examined, no change in status, stable for surgery.  I have reviewed the patient's chart and labs.  Questions were answered to the patient's satisfaction.     Sherren Mocha

## 2017-02-22 NOTE — Progress Notes (Signed)
Advanced Heart Failure Rounding Note  Primary Cardiologist: New  Subjective:    L/RHC y7/1 with severe 3 v CAD with multiple areas of in-stent re-stenosis, severe iCM with EF 15% in setting of previous large anterior MI, and relatively well-compensated hemodynamics after diuresis. Full report below.  24-hour Thallium viability study reviewed personally and minimal or no viability in all segments except for septum  Underwent successful 2v PCI today. Feels good. Denies CP or SOB    [facility] 02/14/17  Mid RCA-2 lesion, 80 %stenosed.  Mid RCA-1 lesion, 90 %stenosed.  Prox RCA-2 lesion, 50 %stenosed.  Prox RCA-1 lesion, 30 %stenosed.  Dist RCA lesion, 30 %stenosed.  2nd Mrg lesion, 95 %stenosed.  Mid LAD to Dist LAD lesion, 95 %stenosed.  Dist LAD lesion, 30 %stenosed.  2nd Diag lesion, 99 %stenosed.  There is severe left ventricular systolic dysfunction.   Findings:  Ao = 108/78 (91) LV = 108/20 RA =  6 RV = 54/9 PA = 57/33 (41) PCW = 19 Fick cardiac output/index = 5.9/3.1 PVR = 3.7 WU SVR = 1150  Ao sat = 94% PA sat = 58%, 60%  Assessment: 1. Severe 3 v CAD with multiple areas of in-stent restenosis 2. Severe iCM with EF 15% in setting of previous large anterior MI 3. Relatively well-compensated hemodynamics after diuresis  Objective:   Weight Range: 71.6 kg (157 lb 12.8 oz) Body mass index is 22.64 kg/m.   Vital Signs:   Temp:  [97.5 F (36.4 C)-97.8 F (36.6 C)] 97.5 F (36.4 C) (07/10 1943) Pulse Rate:  [72-168] 90 (07/10 1943) Resp:  [0-49] 23 (07/10 1943) BP: (98-138)/(64-101) 134/89 (07/10 1945) SpO2:  [0 %-100 %] 98 % (07/10 1943) Weight:  [71.6 kg (157 lb 12.8 oz)] 71.6 kg (157 lb 12.8 oz) (07/10 0550) Last BM Date: 02/21/17  Weight change: Filed Weights   02/20/17 0640 02/21/17 0701 02/22/17 0550  Weight: 72.1 kg (159 lb) 71.6 kg (157 lb 12.8 oz) 71.6 kg (157 lb 12.8 oz)    Intake/Output:   Intake/Output Summary (Last 24 hours)  at 02/22/17 2031 Last data filed at 02/22/17 1944  Gross per 24 hour  Intake            542.5 ml  Output             1650 ml  Net          -1107.5 ml      Physical Exam    General:  Well appearing. No resp difficulty HEENT: normal Neck: supple. no JVD. Carotids 2+ bilat; no bruits. No lymphadenopathy or thryomegaly appreciated. Cor: PMI laterally displaced. Regular rate & rhythm. No rubs, gallops or murmurs. Lungs: clear decreased at bases Abdomen: soft, nontender, nondistended. No hepatosplenomegaly. No bruits or masses. Good bowel sounds. Extremities: no cyanosis, clubbing, rash, edema Neuro: alert & orientedx3, cranial nerves grossly intact. moves all 4 extremities w/o difficulty. Affect pleasant   Telemetry   Personally reviewed, NSR 70-80s   EKG    N/A  Labs    CBC  Recent Labs  02/21/17 0509 02/22/17 0256  WBC 5.4 6.2  HGB 10.4* 10.2*  HCT 33.0* 32.4*  MCV 85.7 85.9  PLT 136* 191*   Basic Metabolic Panel  Recent Labs  02/21/17 0509 02/22/17 0256  NA 134* 135  K 4.1 4.2  CL 103 104  CO2 24 24  GLUCOSE 92 102*  BUN 13 14  CREATININE 0.66 0.68  CALCIUM 8.9 8.8*  MG 1.7  --  Liver Function Tests No results for input(s): AST, ALT, ALKPHOS, BILITOT, PROT, ALBUMIN in the last 72 hours. No results for input(s): LIPASE, AMYLASE in the last 72 hours. Cardiac Enzymes No results for input(s): CKTOTAL, CKMB, CKMBINDEX, TROPONINI in the last 72 hours.  BNP: BNP (last 3 results)  Recent Labs  02/12/17 1221  BNP >4,500.0*    ProBNP (last 3 results) No results for input(s): PROBNP in the last 8760 hours.   D-Dimer No results for input(s): DDIMER in the last 72 hours. Hemoglobin A1C No results for input(s): HGBA1C in the last 72 hours. Fasting Lipid Panel No results for input(s): CHOL, HDL, LDLCALC, TRIG, CHOLHDL, LDLDIRECT in the last 72 hours. Thyroid Function Tests No results for input(s): TSH, T4TOTAL, T3FREE, THYROIDAB in the last 72  hours.  Invalid input(s): FREET3  Other results:   Imaging    No results found.   Medications:     Scheduled Medications: . active partnership for health of your heart book   Does not apply Once  . angioplasty book   Does not apply Once  . arformoterol  15 mcg Nebulization BID  . aspirin EC  81 mg Oral Daily  . budesonide (PULMICORT) nebulizer solution  0.5 mg Nebulization BID  . carvedilol  3.125 mg Oral BID WC  . cholecalciferol  2,000 Units Oral Daily  . digoxin  0.125 mg Oral Daily  . docusate sodium  100 mg Oral Daily  . DULoxetine  60 mg Oral Daily  . enoxaparin (LOVENOX) injection  40 mg Subcutaneous Q24H  . ferrous sulfate  325 mg Oral BID WC  . insulin aspart  0-5 Units Subcutaneous QHS  . insulin aspart  0-9 Units Subcutaneous TID WC  . levothyroxine  150 mcg Oral QAC breakfast  . losartan  25 mg Oral BID  . mupirocin ointment   Nasal BID  . pantoprazole  40 mg Oral QHS  . rosuvastatin  20 mg Oral q1800  . sodium chloride flush  3 mL Intravenous Q12H  . sodium chloride flush  3 mL Intravenous Q12H  . spironolactone  25 mg Oral Daily  . ticagrelor  90 mg Oral BID  . vitamin B-12  2,000 mcg Oral Daily    Infusions: . sodium chloride    . sodium chloride      PRN Medications: sodium chloride, sodium chloride, acetaminophen, albuterol, ALPRAZolam, ondansetron (ZOFRAN) IV, sodium chloride flush, sodium chloride flush    Patient Profile   60yoM with h/o chronic Systolic CHF (EF 22%), CAD s/p MI, ICD, COPD, OSA on CPAP, HTN, Hypothyroidism. Multiple recent hospitalizations admitted with recurrent respiratory failure and large left pleural effusion. Now s/p thoracentesis (transudative)  Assessment/Plan   1. Acute on chronic systolic HF due to iCM - EF 15-20% by echo 6/30. Moderate RV dysfunction. Personally reviewed.  - Chronic NYHA IIIb-IV.  - RHC 7/2 with compensated hemodynamics after diuresis.  - Volume status looks good off diuretics - Continue  spiro to 25 mg daily.  - Continue digoxin 0.125 mg daily - Continue losartan 25 mg BID. BP stable.   - Continue coreg 3.125 mg BID.   2. CAD s/p previous MI - Cath 7/1 with severe 3 v CAD with multiple areas of ISR - Dr. Haroldine Laws has reviewed films with Dr. Burt Knack and Dr. Prescott Gum - Suspect anterior wall is non-viable given previous LAD stent thrombosis.  - 24-hour Thallium viability study reviewed personally and minimal or no viability in all segments except for septum -  Status post 2v PCI today with Dr. Burt Knack. Doing well.  - Continue ASA, statin and Brillinta.   3. Acute on chronic hypoxic respiratory failure with large left pleural effusion - Much improved after thoracentesis.  - Per report report tap was bloody but effusion is transudative by Light's criteria. Cytology only with mesothelial cells commonly seen in HF. Dr. Haroldine Laws reviewed with Dr. Titus Mould  - Repeat CT 6/30 showed moderate residual effusion.  - F/u C/X on 7/5 with small to moderate residual effusion. (no change) Personally reviewed.  - Desats with exertion. Will likely need home O2. Continue to follow.  - Continue pulmonary toilet.  - Completed  levaquin x 8 days for possible PNA per primary. Afebrile. No change.   4. COPD - Stable. Per primary.    5. Mediastinal lymphadenopathy on chest CT - ? Related to HF or other underlying process. LDH ok. Consider outpatient PET.  - No change.    6. Failure to thrive/severe deconditioning - Continues to improve with CR. Walked 740 feet with CR this am. (Walked 460 feet Saturday)    Length of Stay: 14  Glori Bickers, MD  02/22/2017, 8:31 PM  Advanced Heart Failure Team Pager [phone] (M-F; 7a - 4p)  Please contact [facility] for night-coverage after hours (4p -7a ) and weekends on [email or web address]

## 2017-02-22 NOTE — Progress Notes (Signed)
Called cath lab, RN stated to give scheduled morning medications. Pt connected to KVO normal saline. Awaiting transportation. Family has all belongings.    Maryruth Apple

## 2017-02-22 NOTE — Progress Notes (Signed)
TR BAND REMOVAL  LOCATION:    right radial  DEFLATED PER PROTOCOL:    Yes.    TIME BAND OFF / DRESSING APPLIED:    1430   SITE UPON ARRIVAL:    Level 1( BRUISE)  SITE AFTER BAND REMOVAL:    Level 1  CIRCULATION SENSATION AND MOVEMENT:    Within Normal Limits   Yes.    COMMENTS:   TOLERATED PROCEDURE WELL.

## 2017-02-22 NOTE — Progress Notes (Signed)
Patient called to report bleeding from right radial site, shadowing noted on dressing. Dressing removed and noted slow capillary ooze from puncture site, pressure held for 5 minutes, replaced dressing and instructed patient to call if bleeding continued. Explained the difference between arterial and capillary bleeding, Patient verbalized understanding. Area level 1, soft slightly bruised. Report to Chaney Born RN.

## 2017-02-23 LAB — BASIC METABOLIC PANEL
Anion gap: 7 (ref 5–15)
BUN: 13 mg/dL (ref 6–20)
CALCIUM: 9 mg/dL (ref 8.9–10.3)
CO2: 23 mmol/L (ref 22–32)
CREATININE: 0.8 mg/dL (ref 0.61–1.24)
Chloride: 104 mmol/L (ref 101–111)
Glucose, Bld: 104 mg/dL — ABNORMAL HIGH (ref 65–99)
Potassium: 4.6 mmol/L (ref 3.5–5.1)
SODIUM: 134 mmol/L — AB (ref 135–145)

## 2017-02-23 LAB — CBC
HCT: 33.5 % — ABNORMAL LOW (ref 39.0–52.0)
Hemoglobin: 10.4 g/dL — ABNORMAL LOW (ref 13.0–17.0)
MCH: 26.9 pg (ref 26.0–34.0)
MCHC: 31 g/dL (ref 30.0–36.0)
MCV: 86.6 fL (ref 78.0–100.0)
PLATELETS: 138 10*3/uL — AB (ref 150–400)
RBC: 3.87 MIL/uL — ABNORMAL LOW (ref 4.22–5.81)
RDW: 18.6 % — AB (ref 11.5–15.5)
WBC: 7.5 10*3/uL (ref 4.0–10.5)

## 2017-02-23 LAB — GLUCOSE, CAPILLARY
GLUCOSE-CAPILLARY: 108 mg/dL — AB (ref 65–99)
Glucose-Capillary: 127 mg/dL — ABNORMAL HIGH (ref 65–99)
Glucose-Capillary: 104 mg/dL — ABNORMAL HIGH (ref 65–99)

## 2017-02-23 MED ORDER — IPRATROPIUM-ALBUTEROL 0.5-2.5 (3) MG/3ML IN SOLN: 3.0000 mL | Freq: Four times a day (QID) | RESPIRATORY_TRACT | 0 refills | Status: DC | PRN

## 2017-02-23 MED ORDER — BUDESONIDE 0.5 MG/2ML IN SUSP: 0.5000 mg | Freq: Two times a day (BID) | RESPIRATORY_TRACT | 2 refills | Status: DC

## 2017-02-23 MED ORDER — CARVEDILOL 3.125 MG PO TABS: 3.1250 mg | ORAL_TABLET | Freq: Two times a day (BID) | ORAL | 0 refills | Status: DC

## 2017-02-23 MED ORDER — LOSARTAN POTASSIUM 25 MG PO TABS: 25.0000 mg | ORAL_TABLET | Freq: Two times a day (BID) | ORAL | 0 refills | Status: DC

## 2017-02-23 MED ORDER — PANTOPRAZOLE SODIUM 40 MG PO TBEC: 40.0000 mg | DELAYED_RELEASE_TABLET | Freq: Every day | ORAL | 0 refills | Status: DC

## 2017-02-23 MED ORDER — DIGOXIN 125 MCG PO TABS: 0.1250 mg | ORAL_TABLET | Freq: Every day | ORAL | 0 refills | Status: DC

## 2017-02-23 MED ORDER — ROSUVASTATIN CALCIUM 20 MG PO TABS: 20.0000 mg | ORAL_TABLET | Freq: Every day | ORAL | 0 refills | Status: DC

## 2017-02-23 MED ORDER — FERROUS SULFATE 325 (65 FE) MG PO TABS: 325.0000 mg | ORAL_TABLET | Freq: Two times a day (BID) | ORAL | 3 refills | Status: DC

## 2017-02-23 MED ORDER — SPIRONOLACTONE 25 MG PO TABS: 25.0000 mg | ORAL_TABLET | Freq: Every day | ORAL | 0 refills | Status: DC

## 2017-02-23 NOTE — Progress Notes (Signed)
Physical Therapy Treatment Patient Details Name: Stephen Cardenas MRN: [id number] DOB: 09/26/1955 Today's Date: 02/23/2017    History of Present Illness 61-year-old male with chronic systolic CHF (EF of 15%), CAD with history of MI, AICD, COPD, OSA on CPAP, hypertension, hypothyroidism, recently treated for HCT AP and multiple recent hospitalization who was discharged from [facility] 3 weeks back and then subsequently admitted to [facility], discharged to rehabilitation from there to rehabilitation with failure to thrive presented to the ED with increasing shortness of breath. EMS was called and patient taken to [facility] and placed on BiPAP. Chest x-ray showed large lateral left pleural effusion. He received IV Zosyn and Levaquin in the hospital. CT chest was unable to be obtained since patient unable to lie down flat. He was then referred to critical care at [place] and transferred here for further management. Patient admitted for sepsis possibly secondary to left parapneumonic effusion. Cardiac Cath with successful 2 vessel PCI    PT Comments    Continuing work on functional mobility and activity tolerance; overal doing well; walked on Room Air and O2 sats remained greater than or equal to 93%; Majority of goals met; OK for dc home from PT standpoint    Follow Up Recommendations  Other (comment) (would prioritize Cardiac Rehab Phase 2 over HHPT)  Left message with Deborah, Case Mgr     Equipment Recommendations  None recommended by PT    Recommendations for Other Services       Precautions / Restrictions Precautions Precautions: None    Mobility  Bed Mobility Overal bed mobility: Independent                Transfers     Transfers: Sit to/from Stand Sit to Stand: Independent            Ambulation/Gait Ambulation/Gait assistance: Supervision Ambulation Distance (Feet): 400 Feet Assistive device: None Gait Pattern/deviations:  Step-through pattern;Decreased stride length;Trunk flexed   Gait velocity interpretation: at or above normal speed for age/gender General Gait Details: Cues to self-monitor for activity tolerance; walked on room air and O2 sats stayed greater than or equal to 93%   Stairs Stairs:  (pt declined, and I do not foresee any difficulty with the stair to enter Mr. Deboy's home)          Wheelchair Mobility    Modified Rankin (Stroke Patients Only)       Balance                                            Cognition Arousal/Alertness: Awake/alert Behavior During Therapy: WFL for tasks assessed/performed Overall Cognitive Status: Within Functional Limits for tasks assessed                                        Exercises      General Comments        Pertinent Vitals/Pain Pain Assessment: No/denies pain    Home Living                      Prior Function            PT Goals (current goals can now be found in the care plan section) Acute Rehab PT Goals Patient Stated Goal: to   go home PT Goal Formulation: With patient Time For Goal Achievement: 02/25/17 Potential to Achieve Goals: Good (Majority of goals met) Progress towards PT goals: Progressing toward goals    Frequency    Min 3X/week      PT Plan Discharge plan needs to be updated    Co-evaluation              AM-PAC PT "6 Clicks" Daily Activity  Outcome Measure  Difficulty turning over in bed (including adjusting bedclothes, sheets and blankets)?: None Difficulty moving from lying on back to sitting on the side of the bed? : None Difficulty sitting down on and standing up from a chair with arms (e.g., wheelchair, bedside commode, etc,.)?: None Help needed moving to and from a bed to chair (including a wheelchair)?: None Help needed walking in hospital room?: None Help needed climbing 3-5 steps with a railing? : A Little 6 Click Score: 23    End of  Session   Activity Tolerance: Patient tolerated treatment well Patient left: in bed;with call bell/phone within reach Nurse Communication: Mobility status PT Visit Diagnosis: Muscle weakness (generalized) (M62.81);Unsteadiness on feet (R26.81)     Time: 1150-1202 PT Time Calculation (min) (ACUTE ONLY): 12 min  Charges:  $Gait Training: 8-22 mins                    G Codes:       Holly Garrigan, PT  Acute Rehabilitation Services Pager [phone] Office [phone]    Holly H Garrigan 02/23/2017, 1:05 PM   

## 2017-02-23 NOTE — Care Management Note (Addendum)
Case Management Note  Patient Details  Name: Stephen Cardenas MRN: [id number] Date of Birth: [birth year]  Subjective/Objective:    Patient previously set up by previous NCM for [facility] services for [facility] [place] with [facility].  Patient is now s/p  thallium viability study and successful 2v pci, will cont on asa and brilinta  Which he was previously taking at home.  Per physical therapy note patient may need home oxygen.   NCM will await consult for this if needed.  NCM notified Stanton Kidney with [facility] that patient may dc today.  NCM was told , he will not need home oxygen.  NCM also received vm from physical therapist stating patient does not need HHPT, a referral will be made for Cardiac Rehab phase 2 by cardiac rehab.  Patient also stated he does not want HHRN that was previously set up.  He will be following up at the Heart Failure clinic. NCM notified Stanton Kidney with [facility].                            Action/Plan: NCM will follow for dc needs.   Expected Discharge Date:                  Expected Discharge Plan:  Skilled Nursing Facility  In-House Referral:  Clinical Social Work  Discharge planning Services  CM Consult  Post Acute Care Choice:    Choice offered to:  Patient  DME Arranged:    DME Agency:     HH Arranged:  RN, PT [place] Agency:  [facility] (now [facility])  Status of Service:  Completed, signed off  If discussed at [employer] of Stay Meetings, dates discussed:    Additional Comments:  Zenon Mayo, RN 02/23/2017, 9:28 AM

## 2017-02-23 NOTE — Progress Notes (Signed)
CARDIAC REHAB PHASE I   PRE:  Rate/Rhythm: 88 first deg    BP: sitting 117/84    SaO2: 98 RA  MODE:  Ambulation: 360 ft   POST:  Rate/Rhythm: 110 first deg    BP: sitting 119/78     SaO2: 97 RA  Pt sleeping soundly, hard to wake. Sts he didn't sleep well. Ambulated steady, some SOB. Sts he is just tired today, decreased distance. Overall doing well. In first degree, increased rate with walking. To recliner. Ed completed including HF management. Pt is well-versed on HF/CAD. Gave HF booklet and discussed daily wts, low sodium, ambulation, NTG, Brilinta, and CRPII. Will send referral to [facility]. [phone]  [place], ACSM 02/23/2017 9:18 AM

## 2017-02-23 NOTE — Progress Notes (Signed)
Advanced Heart Failure Rounding Note  Primary Cardiologist: New  Subjective:    L/RHC y7/1 with severe 3 v CAD with multiple areas of in-stent re-stenosis, severe iCM with EF 15% in setting of previous large anterior MI, and relatively well-compensated hemodynamics after diuresis. Full report below.  24-hour Thallium viability study personally reviewed by Dr. Haroldine Laws with minimal or no viability in all segments except for septum  Underwent successful 2v PCI 02/22/17.  Feels great today. Denies CP. No SOB walking halls. Walked 800 feet with PT yesterday. Still recommending SNF, unless pt refuses. Then consider HHPT.   [facility] 02/14/17  Mid RCA-2 lesion, 80 %stenosed.  Mid RCA-1 lesion, 90 %stenosed.  Prox RCA-2 lesion, 50 %stenosed.  Prox RCA-1 lesion, 30 %stenosed.  Dist RCA lesion, 30 %stenosed.  2nd Mrg lesion, 95 %stenosed.  Mid LAD to Dist LAD lesion, 95 %stenosed.  Dist LAD lesion, 30 %stenosed.  2nd Diag lesion, 99 %stenosed.  There is severe left ventricular systolic dysfunction.   Findings:  Ao = 108/78 (91) LV = 108/20 RA =  6 RV = 54/9 PA = 57/33 (41) PCW = 19 Fick cardiac output/index = 5.9/3.1 PVR = 3.7 WU SVR = 1150  Ao sat = 94% PA sat = 58%, 60%  Assessment: 1. Severe 3 v CAD with multiple areas of in-stent restenosis 2. Severe iCM with EF 15% in setting of previous large anterior MI 3. Relatively well-compensated hemodynamics after diuresis  Objective:   Weight Range: 158 lb 11.7 oz (72 kg) Body mass index is 22.78 kg/m.   Vital Signs:   Temp:  [97.5 F (36.4 C)-98.1 F (36.7 C)] 97.5 F (36.4 C) (07/11 0830) Pulse Rate:  [87-92] 92 (07/11 0830) Resp:  [13-30] 28 (07/11 0830) BP: (104-134)/(75-101) 119/78 (07/11 0830) SpO2:  [94 %-98 %] 94 % (07/11 0830) Weight:  [158 lb 11.7 oz (72 kg)] 158 lb 11.7 oz (72 kg) (07/11 0209) Last BM Date: 02/21/17  Weight change: Filed Weights   02/21/17 0701 02/22/17 0550 02/23/17 0209    Weight: 157 lb 12.8 oz (71.6 kg) 157 lb 12.8 oz (71.6 kg) 158 lb 11.7 oz (72 kg)   Intake/Output:   Intake/Output Summary (Last 24 hours) at 02/23/17 1239 Last data filed at 02/23/17 0900  Gross per 24 hour  Intake             1520 ml  Output             2225 ml  Net             -705 ml    Physical Exam    General: Well appearing. No resp difficulty. HEENT: Normal Neck: Supple. JVP 6-7. Carotids 2+ bilat; no bruits. No thyromegaly or nodule noted. Cor: PMI nondisplaced. RRR, No M/G/R noted Lungs: CTAB, normal effort. Abdomen: Soft, non-tender, non-distended, no HSM. No bruits or masses. +BS  Extremities: No cyanosis, clubbing, rash, R and LLE no edema.  Neuro: Alert & orientedx3, cranial nerves grossly intact. moves all 4 extremities w/o difficulty. Affect pleasant   Telemetry   Personally reviewed, NSR 70-80s    EKG    N/A  Labs    CBC  Recent Labs  02/22/17 0256 02/23/17 0241  WBC 6.2 7.5  HGB 10.2* 10.4*  HCT 32.4* 33.5*  MCV 85.9 86.6  PLT 137* 580*   Basic Metabolic Panel  Recent Labs  02/21/17 0509 02/22/17 0256 02/23/17 0241  NA 134* 135 134*  K 4.1 4.2 4.6  CL 103 104 104  CO2 24 24 23   GLUCOSE 92 102* 104*  BUN 13 14 13   CREATININE 0.66 0.68 0.80  CALCIUM 8.9 8.8* 9.0  MG 1.7  --   --    Liver Function Tests No results for input(s): AST, ALT, ALKPHOS, BILITOT, PROT, ALBUMIN in the last 72 hours. No results for input(s): LIPASE, AMYLASE in the last 72 hours. Cardiac Enzymes No results for input(s): CKTOTAL, CKMB, CKMBINDEX, TROPONINI in the last 72 hours.  BNP: BNP (last 3 results)  Recent Labs  02/12/17 1221  BNP >4,500.0*    ProBNP (last 3 results) No results for input(s): PROBNP in the last 8760 hours.   D-Dimer No results for input(s): DDIMER in the last 72 hours. Hemoglobin A1C No results for input(s): HGBA1C in the last 72 hours. Fasting Lipid Panel No results for input(s): CHOL, HDL, LDLCALC, TRIG, CHOLHDL,  LDLDIRECT in the last 72 hours. Thyroid Function Tests No results for input(s): TSH, T4TOTAL, T3FREE, THYROIDAB in the last 72 hours.  Invalid input(s): FREET3  Other results:   Imaging    No results found.   Medications:     Scheduled Medications: . arformoterol  15 mcg Nebulization BID  . aspirin EC  81 mg Oral Daily  . budesonide (PULMICORT) nebulizer solution  0.5 mg Nebulization BID  . carvedilol  3.125 mg Oral BID WC  . cholecalciferol  2,000 Units Oral Daily  . digoxin  0.125 mg Oral Daily  . docusate sodium  100 mg Oral Daily  . DULoxetine  60 mg Oral Daily  . enoxaparin (LOVENOX) injection  40 mg Subcutaneous Q24H  . ferrous sulfate  325 mg Oral BID WC  . insulin aspart  0-5 Units Subcutaneous QHS  . insulin aspart  0-9 Units Subcutaneous TID WC  . levothyroxine  150 mcg Oral QAC breakfast  . losartan  25 mg Oral BID  . pantoprazole  40 mg Oral QHS  . rosuvastatin  20 mg Oral q1800  . sodium chloride flush  3 mL Intravenous Q12H  . sodium chloride flush  3 mL Intravenous Q12H  . spironolactone  25 mg Oral Daily  . ticagrelor  90 mg Oral BID  . vitamin B-12  2,000 mcg Oral Daily    Infusions: . sodium chloride    . sodium chloride      PRN Medications: sodium chloride, sodium chloride, acetaminophen, albuterol, ALPRAZolam, ondansetron (ZOFRAN) IV, sodium chloride flush, sodium chloride flush    Patient Profile   60yoM with h/o chronic Systolic CHF (EF 59%), CAD s/p MI, ICD, COPD, OSA on CPAP, HTN, Hypothyroidism. Multiple recent hospitalizations admitted with recurrent respiratory failure and large left pleural effusion. Now s/p thoracentesis (transudative)  Assessment/Plan   1. Acute on chronic systolic HF due to iCM - EF 15-20% by echo 6/30. Moderate RV dysfunction. Personally reviewed.   - NYHA III symptoms s/p PCI.  - RHC 7/2 with compensated hemodynamics after diuresis.  - Volume status remains stable for now off of diuretics.  - Continue  spiro to 25 mg daily.  - Continue digoxin 0.125 mg daily - Continue losartan 25 mg BID. BP stable.   - Continue coreg 3.125 mg BID.  - was on lasix 40 mg daily at home PTA. Would resume on discharge.   2. CAD s/p previous MI - Cath 7/1 with severe 3 v CAD with multiple areas of ISR - s/p 2v PCI 02/22/17 by Dr. Burt Knack.  - Feeling great.  - Suspect anterior  wall is non-viable given previous LAD stent thrombosis.  - 24-hour Thallium viability study reviewed personally and minimal or no viability in all segments except for septum - Continue ASA, statin and Brillinta.   3. Acute on chronic hypoxic respiratory failure with large left pleural effusion - Much improved after thoracentesis.  - Per report report tap was bloody but effusion is transudative by Light's criteria. Cytology only with mesothelial cells commonly seen in HF. Dr. Haroldine Laws reviewed with Dr. Titus Mould  - Repeat CT 6/30 showed moderate residual effusion.  - F/u C/X on 7/5 with small to moderate residual effusion. (no change) Personally reviewed.  - Follow for need for 02 at home. Need to ambulate and check sats prior to d/c.  - Continue pulmonary toilet.  - Completed  levaquin x 8 days for possible PNA per primary. Afebrile. No change.   4. COPD - Stable. Per primary.  No change.   5. Mediastinal lymphadenopathy on chest CT - ? Related to HF or other underlying process. LDH ok. Consider outpatient PET.  - No change.  6. Failure to thrive/severe deconditioning - Continues to improve. Walking up to 800 feet.  - PT recommends SNF.   OK for home today from HF perspective HF meds for home - Lasix 40 mg daily - take 40 bid if having weight gain or swelling - Losartan 25 mg BID - Spiro 25 mg daily - Coreg 3.125 mg BID - Digoxin 0.125 mg daily - ASA 81 mg daily. - Rosuvastatin 20 mg daily - Ticagrelor (Brilinta) 90 mg BID.   Will have post cath visit set up at [facility] office, and will follow up in the HF clinic within  the next 3-4 weeks.    Length of Stay: [place], [place]  02/23/2017, 12:39 PM  Advanced Heart Failure Team Pager [phone] (M-F; 7a - 4p)  Please contact [facility] for night-coverage after hours (4p -7a ) and weekends on [email or web address]  Patient seen and examined with the above-signed Advanced Practice Provider and/or Housestaff. I personally reviewed laboratory data, imaging studies and relevant notes. I independently examined the patient and formulated the important aspects of the plan. I have edited the note to reflect any of my changes or salient points. I have personally discussed the plan with the patient and/or family.  He is stable s/p 2v PCI. HF stable. OK for d/c today on above meds. Refer to cardiac rehab. Will need close f/u in HF Clinic.   Glori Bickers, MD  3:22 PM

## 2017-02-23 NOTE — Discharge Summary (Signed)
Physician Discharge Summary  Stephen Cardenas ERD:[id number] DOB: August 14, 1956 DOA: 02/08/2017  PCP: System, Pcp Not In  Admit date: 02/08/2017 Discharge date: 02/23/2017  Admitted From: Home.  Disposition:  Home  Recommendations for Outpatient Follow-up:  1. Follow up with PCP in 1-2 weeks 2. Please obtain BMP/CBC in one week 3. Please follow up with HF team as recommended.  4. Recommend getting a PET scan for mediastinal adenopathy as outpatient.   Home Health:yes.   Discharge Condition: stable.  CODE STATUS:full code.  Diet recommendation: Heart Healthy  Brief/Interim Summary: 61 year old with severe vessel CAD, severe ischemic cardiomyopathy, with LVEF of 15%, hypertension, hypothyroidism, OSA on CPAP, COPD, s/p AICD,  presented with DOE, sepsis, with para pneumonic effusion. He underwent thoracentesis and repeat CXR shows some persistent opacity. He completed the course of antibiotics for possible para pneumonic effusion. He also underwent 24 hour thallium viability test, which showed no viability except for the septum. Patient underwent 2 vessel PCI with treatment of the severe in stent restenosis in the left circumflex and in the proximal and mid RCA. Cardiology recommended aspirin and ticagrelor for 1 year.   Discharge Diagnoses:  Active Problems:   Acute respiratory failure with hypoxia (HCC)   Ventilator dependent (HCC)   Shortness of breath   SOB (shortness of breath)   Pleural effusion   Acute on chronic systolic heart failure (HCC)   CAD in native artery  Acute respiratory failure with hypoxia, secondary to acute on chronic systolic CHF, left pleural effusion,  Suspect parapneumonia effusion vs transudate from CHF, .  Diuresed and his breathing has improved. Has diuresed about 12  liters since admission.  Cardiac cath showed severe three vessel disease and multiple areas of in stent restenosis.  Work up with Thallium viability test( its a two day test) shows non viability  except the septum. Patient underwent 2 vessel PCI with treatment of the severe in stent restenosis in the left circumflex and in the proximal and mid RCA. Cardiology recommended aspirin and ticagrelor for 1 year. Resume aspirin, statin , spironolactone 12.5 mg daily, coreg.  S/p thoracentesis. Cytology showing mesothelial cells. Completed the course of antibiotics. Urine for strep and legionella antigen negative.  Repeat CXR shows Persistent abnormal opacity at the left lung base best consistent with effusion, pneumonia, and possibly atelectasis. He is afebrile, no leukocytosis, does not look toxic, would not start him on antibiotics.   Elevated troponins: Demand ischemia with CHF.    COPD:  No wheezing heard, resume pulmicort and brovana.  Continue with duoneb.  No changes in medications.  Off oxygen today.    AKI; resolved.   Anemia:  Normocytic.  Hemoglobin stable between 9 to 10.     Hypertension:  Controlled.  Resume cozaar.    Hyperlipidemia:  Resume Crestor.    Hypothyroidism: Resume synthroid.   Mediastinal adenopathy seen on CT,  Reactive? , recommend outpatient PET scan.   Mild hyponatremia asymptomatic.    Discharge Instructions  Discharge Instructions    Amb Referral to Cardiac Rehabilitation    Complete by:  As directed    To [place]   Diagnosis:   Coronary Stents Heart Failure (see criteria below if ordering Phase II) PTCA     Heart Failure Type:  Chronic Systolic & Diastolic      Follow-up Information    Home, [facility] Follow up.   Specialty:  Home Health Services Why:  They will do your home health care at your home Contact information:  [address] [place]  [id number] [phone]          Allergies  Allergen Reactions  . Codeine Other (See Comments)    Increased work of breathing, diaphoretic    Consultations:  Cardiology  Heart failure team.    Procedures/Studies: Ct Chest Wo  Contrast  Result Date: 02/12/2017 CLINICAL DATA:  Pleural effusion EXAM: CT CHEST WITHOUT CONTRAST TECHNIQUE: Multidetector CT imaging of the chest was performed following the standard protocol without IV contrast. COMPARISON:  Radiograph 02/12/2017, CT 02/09/2017 FINDINGS: Cardiovascular: Limited evaluation without intravenous contrast. Aortic atherosclerosis. No aneurysmal dilatation. Ectasia of the ascending segment. Enlarged pulmonary arteries. Intracardiac pacing leads. Mild cardiomegaly. No large pericardial effusion. Coronary artery calcification. Mediastinum/Nodes: Midline trachea. No thyroid mass. Bulky mediastinal adenopathy re- demonstrated, right paratracheal node or mass measures 3.5 cm compared with 3.5 cm previously. Esophagus grossly unremarkable. Lungs/Pleura: Emphysema at the upper lobes. No focal consolidation or significant effusion on the right. Decreased pleural effusion in the left upper chest with some loculation at the apex and fluid along the fissure. Moderate subpulmonic pleural effusion remains. Consolidation within the left lower lobe and lingula may reflect atelectasis or pneumonia Upper Abdomen: No acute abnormality. Partially visualized probable 1.9 cm cyst in the mid right kidney. Musculoskeletal: Moderate severe compression fracture of L1, uncertain chronicity, with retropulsion of 7 mm on sagittal views. Estimated 50% loss of vertebral body height anteriorly. No other significant osseous abnormality. IMPRESSION: 1. Slight decreased pleural effusion at the left upper lobe with fluid tracking along the left pulmonary fissure. Moderate residual left pleural effusion at the lung base, largely subpulmonic. 2. No change in bulky mediastinal adenopathy, which may be secondary to infection, inflammation, or neoplasm. Correlation with PET-CT or possible tissue sampling previously recommended. 3. Patchy consolidation in the lingula and left lower lobe may reflect atelectasis or pneumonia  4. Moderate severe compression fracture of L1, uncertain chronicity, with 7 mm of retropulsion. Aortic Atherosclerosis (ICD10-I70.0) and Emphysema (ICD10-J43.9). Electronically Signed   By: Donavan Foil M.D.   On: 02/12/2017 20:15   Ct Chest Wo Contrast  Result Date: 02/09/2017 CLINICAL DATA:  Evaluate pleural effusion EXAM: CT CHEST WITHOUT CONTRAST TECHNIQUE: Multidetector CT imaging of the chest was performed following the standard protocol without IV contrast. COMPARISON:  Earlier same day FINDINGS: Cardiovascular: Marked cardiomegaly. No pericardial effusion. Left anterior chest wall pacer leads terminate within the right atrium and ventricle. Post coronary artery stent placement. Mild ectasia of the ascending thoracic aorta measuring 30 mm in diameter. The main pulmonary artery is enlarged measuring 3.5 mm in diameter. Mediastinum/Nodes: Bulky mediastinal adenopathy with index right paratracheal lymph node measuring 3.5 cm in greatest short axis diameter (image 30, series 3) and index precarinal lymph node measuring 3 cm. Suspected right hilar adenopathy though this is suboptimally evaluated on this noncontrast examination. No axillary adenopathy. Lungs/Pleura: Interval reduction in persistent small left-sided pleural effusion post thoracentesis. No pneumothorax. A small amount of fluid is again seen tracking within the left major fissure. Mild apical predominant centrilobular emphysematous change. No pneumothorax. Improved aeration of the left lung with persistent perihilar ground-glass opacities and left basilar consolidative opacities with associated air bronchograms. Minimal right basilar heterogeneous opacities favored to represent atelectasis. Punctate (approximately 2 mm) calcified granuloma within the left upper lobe (image 16, series 4). No discrete pulmonary nodules or masses given limitation of the examination. Upper Abdomen: Limited noncontrast evaluation of the upper abdomen is unremarkable.  Musculoskeletal: No acute or aggressive osseous abnormalities. IMPRESSION:  1. Interval reduction in persistent small left-sided effusion post thoracentesis. No pneumothorax. 2. Improved aeration of the left lung with persistent left perihilar and basilar opacities, nonspecific with differential considerations including asymmetric pulmonary edema versus infection (including atypical etiologies). 3. Bulky mediastinal adenopathy, the etiology of which is not depicted on this examination, specifically, there is no discrete pulmonary nodule or mass identified on this noncontrast examination. Further evaluation with contrast-enhanced CT of the chest, abdomen and pelvis versus outpatient PET-CT could be performed as clinically indicated. Alternatively, bronchoscopic biopsy could be performed as indicated. 4. Cardiomegaly. 5. Aortic Atherosclerosis (ICD10-I70.0) and Emphysema (ICD10-J43.9). Electronically Signed   By: Sandi Mariscal M.D.   On: 02/09/2017 17:25   Nm Myocar Multi W/spect W/wall Motion / Ef  Result Date: 02/19/2017 : CARDIAC VIABILITY STUDY COMPARISON:  NONE HISTORY: Reported severe three-vessel coronary artery disease with a left ventricular ejection fraction of 15%. Cardiac catheterization demonstrated multiple areas of in-stent restenoses. Evaluate for viability, particularly in the anterior wall. TECHNIQUE: A standard protocol was performed with 3.8 millicuries of thallium 201 with imaging 15 minutes after injection. The patient was re- dosed with 1.1 millicuries thallium 101 with delayed 4 and 24 hour imaging. FINDINGS: Large fixed defects, seen at all 3 time points, are identified in the anterior, inferior, and inferolateral walls. There is mild uptake in the anterolateral wall at all 3 time points. There is uptake throughout much of the anterior septal wall from apex to mid wall at all 3 time points. There is mild decreased uptake in the anterior septal wall towards the base on immediate images with  mild improvement on delayed images. There is a defect in the inferior septal wall from mid wall to base which improves on delayed images. However, there is GI activity adjacent to the inferior septal wall towards the base which increases throughout the study. IMPRESSION: 1. Large infarcts involve the anterior, inferior, and inferolateral walls with no viability identified in these regions. 2. Decreased uptake in the septal wall from mid wall to base on immediate images with improvement on delayed images may represent a relatively small region of hibernating viable myocardium in the septum towards the base. However, adjacent GI activity which increases throughout the study may partially explain the partial reversibility in the inferior septal wall. 3. Preserved myocardium is identified in the septal wall from apex to mid wall and in a small portion of the anterolateral wall. Electronically Signed   By: Dorise Bullion III M.D   On: 02/19/2017 17:36   Dg Chest Port 1 View  Result Date: 02/17/2017 CLINICAL DATA:  Pacemaker insertion, shortness of breath EXAM: PORTABLE CHEST 1 VIEW COMPARISON:  CT chest of 02/12/2017 and portable chest x-ray of same day FINDINGS: There is persistent abnormal opacity at the left lung base consistent with left pleural effusion, and probable left basilar pneumonia and atelectasis. The right lung is clear. Mediastinal and hilar contours are unchanged and cardiomegaly is stable. Permanent pacemaker is present with AICD lead. No pneumothorax is seen. IMPRESSION: 1. Persistent abnormal opacity at the left lung base best consistent with effusion, pneumonia, and possibly atelectasis. 2. Stable cardiomegaly.  Permanent pacemaker with AICD lead. Electronically Signed   By: Ivar Drape M.D.   On: 02/17/2017 15:26   Dg Chest Port 1 View  Result Date: 02/12/2017 CLINICAL DATA:  Shortness of breath, 4 days duration.  Follow-up. EXAM: PORTABLE CHEST 1 VIEW COMPARISON:  02/10/2017 FINDINGS:  Pacemaker/AID appears unchanged. Cardiomegaly persists. Persistent small to moderate size left  effusion with left base atelectasis. Venous hypertension with early interstitial edema. IMPRESSION: Similar appearance to the study of 2 days ago. Persistent small to moderate size left effusion with left base atelectasis. Mild interstitial edema. Electronically Signed   By: Nelson Chimes M.D.   On: 02/12/2017 10:10   Dg Chest Port 1 View  Result Date: 02/10/2017 CLINICAL DATA:  Recent thoracentesis, cough, shortness of breath, CHF EXAM: PORTABLE CHEST 1 VIEW COMPARISON:  02/09/2017 FINDINGS: Patient is status post left thoracentesis. No pneumothorax. Left basilar partial collapse/ consolidation persist. Stable cardiomegaly with central vascular congestion. Right lung remains clear. Left subclavian pacer noted. Trachea is midline. IMPRESSION: Negative for pneumothorax following thoracentesis. Persistent left lower lobe partial collapse / consolidation. Electronically Signed   By: Jerilynn Mages.  Shick M.D.   On: 02/10/2017 07:56   Dg Chest Port 1 View  Result Date: 02/09/2017 CLINICAL DATA:  Thoracentesis EXAM: PORTABLE CHEST 1 VIEW COMPARISON:  02/09/2017 FINDINGS: Substantial reduction of left pleural fluid volume. No pneumothorax. Mild residual left base opacity likely represents a combination of consolidation and residual pleural fluid. The right lung is clear. Moderate cardiomegaly persists. Grossly intact appearances of the transvenous cardiac leads. IMPRESSION: Substantial reduction of pleural fluid volume after left thoracentesis. No pneumothorax. Electronically Signed   By: Andreas Newport M.D.   On: 02/09/2017 03:11   Dg Chest Port 1 View  Result Date: 02/09/2017 CLINICAL DATA:  61 y/o  M; shortness of breath. EXAM: PORTABLE CHEST 1 VIEW COMPARISON:  None. FINDINGS: Near complete opacification of the left lung probably representing a large effusion lung consolidation. Grossly clear right lung. The cardiac  silhouette is largely obscured by opacification of the left hemithorax. Partially visualized 2 lead pacemaker. The patient's chin obscures the lung apices. No acute osseous abnormality is evident. IMPRESSION: Complete opacification of the left lung probably representing large effusion and lung consolidation. Electronically Signed   By: Kristine Garbe M.D.   On: 02/09/2017 01:22       Subjective: No new complaitns.   Discharge Exam: Vitals:   02/23/17 0209 02/23/17 0830  BP: 124/87 119/78  Pulse: 87 92  Resp: 13 (!) 28  Temp: 98.1 F (36.7 C) (!) 97.5 F (36.4 C)   Vitals:   02/22/17 1945 02/22/17 2000 02/23/17 0209 02/23/17 0830  BP: 134/89 (!) 134/96 124/87 119/78  Pulse:  88 87 92  Resp:  (!) 30 13 (!) 28  Temp:   98.1 F (36.7 C) (!) 97.5 F (36.4 C)  TempSrc:   Oral Oral  SpO2:  96% 95% 94%  Weight:   72 kg (158 lb 11.7 oz)   Height:        General: Pt is alert, awake, not in acute distress Cardiovascular: RRR, S1/S2 +, no rubs, no gallops Respiratory: CTA bilaterally, no wheezing, no rhonchi Abdominal: Soft, NT, ND, bowel sounds + Extremities: no edema, no cyanosis    The results of significant diagnostics from this hospitalization (including imaging, microbiology, ancillary and laboratory) are listed below for reference.     Microbiology: No results found for this or any previous visit (from the past 240 hour(s)).   Labs: BNP (last 3 results)  Recent Labs  02/12/17 1221  BNP >2,094.7*   Basic Metabolic Panel:  Recent Labs Lab 02/19/17 0533 02/20/17 0202 02/21/17 0509 02/22/17 0256 02/23/17 0241  NA 133* 134* 134* 135 134*  K 4.1 4.2 4.1 4.2 4.6  CL 103 103 103 104 104  CO2 25 25 24 24  23  GLUCOSE 96 95 92 102* 104*  BUN 15 14 13 14 13   CREATININE 0.78 0.74 0.66 0.68 0.80  CALCIUM 8.5* 8.5* 8.9 8.8* 9.0  MG  --   --  1.7  --   --    Liver Function Tests:  Recent Labs Lab 02/18/17 0250  AST 44*  ALT 20  ALKPHOS 98   BILITOT 1.3*  PROT 6.1*  ALBUMIN 2.7*   No results for input(s): LIPASE, AMYLASE in the last 168 hours. No results for input(s): AMMONIA in the last 168 hours. CBC:  Recent Labs Lab 02/21/17 0509 02/22/17 0256 02/23/17 0241  WBC 5.4 6.2 7.5  HGB 10.4* 10.2* 10.4*  HCT 33.0* 32.4* 33.5*  MCV 85.7 85.9 86.6  PLT 136* 137* 138*   Cardiac Enzymes: No results for input(s): CKTOTAL, CKMB, CKMBINDEX, TROPONINI in the last 168 hours. BNP: Invalid input(s): POCBNP CBG:  Recent Labs Lab 02/22/17 1056 02/22/17 1613 02/22/17 2204 02/23/17 0643 02/23/17 1217  GLUCAP 85 127* 94 104* 108*   D-Dimer No results for input(s): DDIMER in the last 72 hours. Hgb A1c No results for input(s): HGBA1C in the last 72 hours. Lipid Profile No results for input(s): CHOL, HDL, LDLCALC, TRIG, CHOLHDL, LDLDIRECT in the last 72 hours. Thyroid function studies No results for input(s): TSH, T4TOTAL, T3FREE, THYROIDAB in the last 72 hours.  Invalid input(s): FREET3 Anemia work up No results for input(s): VITAMINB12, FOLATE, FERRITIN, TIBC, IRON, RETICCTPCT in the last 72 hours. Urinalysis No results found for: COLORURINE, APPEARANCEUR, LABSPEC, [place], GLUCOSEU, HGBUR, BILIRUBINUR, KETONESUR, PROTEINUR, UROBILINOGEN, NITRITE, LEUKOCYTESUR Sepsis Labs Invalid input(s): PROCALCITONIN,  WBC,  LACTICIDVEN Microbiology No results found for this or any previous visit (from the past 240 hour(s)).   Time coordinating discharge: Over 30 minutes  SIGNED:   Hosie Poisson, MD  [facility] 02/23/2017, 1:00 PM Pager   If 7PM-7AM, please contact night-coverage [email or web address] Password [email or web address]

## 2017-02-25 NOTE — Plan of Care (Signed)
Received call from [facility] regarding Crestor prescription sent in by Dr. Karleen Hampshire. Crestor is not covered by insurance. Called in script for Atorvastatin 40mg  daily.   Also needed to send in scripts for duoneb treatments and budesonide. It seems that now prescriptions need to have a signature, date, and ICD10 code.  Time spent: 30 minutes  Tyrion Glaude D.O. [facility] Pager [phone]  If 7PM-7AM, please contact night-coverage [email or web address] Password [email or web address] 02/25/2017, 11:12 AM

## 2017-02-28 SURGERY — CORONARY ARTERY BYPASS GRAFTING (CABG)
Anesthesia: General | Site: Chest

## 2017-03-07 NOTE — Progress Notes (Signed)
Cardiology Office Note:    Date:  03/08/2017   ID:  Rosita Kea, DOB [birth year], MRN [id number]  PCP:  System, Pcp Not In  Cardiologist:  Dr. Glori Bickers    Referring MD: No ref. provider found   Chief Complaint  Patient presents with  . Hospitalization Follow-up    Acute CHF; CAD status post PCI    History of Present Illness:    Stephen Cardenas is a 61 y.o. male with a hx of Systolic heart failure secondary to ischemic cardiomyopathy, CAD status post multiple prior myocardial infarctions status post multiple prior PCIs, status post AICD, sleep apnea, COPD, HTN, hypothyroidism. He was previously followed by Dr. Orpah Greek in [place]. He was admitted in 2014 with acute stent thrombosis complicated by cardiogenic shock. EF has been 15-20. He had several admissions in June 2018 for enlarging left pleural effusion and was tx to [facility].  (Admit dates: 6/26-7/11)  He was diuresed with IV Lasix and underwent thoracentesis. Right and left heart catheterization demonstrated well compensated hemodynamics and significant in-stent restenosis in the LCx and LAD. Case was discussed with cardiothoracic surgery (Dr. Prescott Gum) and interventional cardiology (Dr. Burt Knack). Viability study was obtained. This demonstrated minimal or no viability in all segments except for the septum. He was not felt to be a surgical candidate. PCI was performed by Dr. Burt Knack with DES to the LCx and DES to the RCA. Of note, he was also treated with antibiotics to cover for parapneumonic effusion.  Stephen Cardenas returns for post-hospitalization follow up.  He is here alone.  He is doing very well since DC. He denies any chest pain, significant shortness of breath, orthopnea, paroxysmal nocturnal dyspnea, edema.  He denies syncope.  He denies cough, fever.  Appetite is good.  He does not smoke.  He has been getting Ticagrelor from the [facility] for years.  Prior CV studies:   The following studies were reviewed today:  PCI  02/22/17 3.5 x 20 mm Promus DES to the LCx 3 x 38 mm Promus DES to the proximal mid RCA  Viability 02/19/17 IMPRESSION: 1. Large infarcts involve the anterior, inferior, and inferolateral walls with no viability identified in these regions. 2. Decreased uptake in the septal wall from mid wall to base on immediate images with improvement on delayed images may represent a relatively small region of hibernating viable myocardium in the septum towards the base. However, adjacent GI activity which increases throughout the study may partially explain the partial reversibility in the inferior septal wall. 3. Preserved myocardium is identified in the septal wall from apex to mid wall and in a small portion of the anterolateral wall.  R/LHC 02/14/17 LAD mid 95 ISR, distal 30, D2 99 LCx okay, OM2 95 ISR RCA proximal 30/50, mid 90/80, distal 30 EF 15 RA 6, RV 54/9, PA 57/33 (41), PCW 19  Echo 02/12/17 Mild LVH, EF 15-20, moderate to severe MR, severe LAE, mild RVE, moderately reduced RVSF, mild RAE, moderate TR, PASP 65  Past Medical History:  Diagnosis Date  . AICD (automatic cardioverter/defibrillator) present 2014  . CAD (coronary artery disease)    S/p multiple prior MIs/PCIs // amdx in 2014 with acute stent thrombosis c/b CGS // Admx to [facility] in 01/1442 2/2 a/c systolic HF >> [facility] 08/20/38: mLAD 95 ISR, dLAD 30, D2 99; OM2 95 ISR; pRCA 30/50, mRCA 90/80, dRCA 30 EF 15 >> min to no viability on Thallium study (not surgical candidate) - PCI>> 3.5 x 20 mm Promus  DES to the LCx 3 x 38 mm Promus DES to the proximal mid RCA  . Chronic systolic heart failure (HCC)    2/2 to ischemic CM - Echo 02/12/17:  Mild LVH, EF 15-20, mod to severe MR, severe LAE, mild RVE, mod reduced RVSF, mild RAE, mod TR, PASP 65  . COPD (chronic obstructive pulmonary disease) ([place])   . History of MI (myocardial infarction)   . History of pneumonia   . HTN (hypertension)   . Hyperlipidemia 03/08/2017  . Hypothyroidism   . OSA  (obstructive sleep apnea) 03/08/2017    Past Surgical History:  Procedure Laterality Date  . CARDIAC DEFIBRILLATOR PLACEMENT  2014  . CORONARY STENT INTERVENTION N/A 02/22/2017   Procedure: Coronary Stent Intervention;  Surgeon: Sherren Mocha, MD;  Location: [facility];  Service: Cardiovascular;  Laterality: N/A;  CFX and RCA  . FACIAL RECONSTRUCTION SURGERY Right 1984  . FEMUR FRACTURE SURGERY Right 1984  . RIGHT/LEFT HEART CATH AND CORONARY ANGIOGRAPHY N/A 02/14/2017   Procedure: Right/Left Heart Cath and Coronary Angiography;  Surgeon: Jolaine Artist, MD;  Location: [facility];  Service: Cardiovascular;  Laterality: N/A;  . WRIST FRACTURE SURGERY Right 2015    Current Medications: Current Meds  Medication Sig  . ALPRAZolam (XANAX) 0.5 MG tablet Take 0.5 mg by mouth at bedtime as needed for anxiety.  Marland Kitchen aspirin EC 81 MG tablet Take 81 mg by mouth daily.  . carvedilol (COREG) 3.125 MG tablet Take 1 tablet (3.125 mg total) by mouth 2 (two) times daily with a meal.  . Cholecalciferol (VITAMIN D3) 2000 units TABS Take 2,000 Units by mouth daily.  . digoxin (LANOXIN) 0.125 MG tablet Take 1 tablet (0.125 mg total) by mouth daily.  Marland Kitchen docusate sodium (COLACE) 100 MG capsule Take 100 mg by mouth daily.  . DULoxetine (CYMBALTA) 60 MG capsule Take 60 mg by mouth daily.  . ferrous sulfate 325 (65 FE) MG tablet Take 1 tablet (325 mg total) by mouth 2 (two) times daily with a meal.  . furosemide (LASIX) 40 MG tablet Take 40 mg by mouth daily.  Marland Kitchen ipratropium-albuterol (DUONEB) 0.5-2.5 (3) MG/3ML SOLN Take 3 mLs by nebulization every 6 (six) hours as needed (for shortness of breath).  Marland Kitchen levothyroxine (SYNTHROID, LEVOTHROID) 150 MCG tablet Take 150 mcg by mouth daily before breakfast.  . losartan (COZAAR) 25 MG tablet Take 1 tablet (25 mg total) by mouth 2 (two) times daily.  . Multiple Vitamins-Minerals (MULTIVITAMIN PO) Take 1 tablet by mouth daily.  . pantoprazole (PROTONIX) 40  MG tablet Take 1 tablet (40 mg total) by mouth at bedtime.  . potassium chloride (K-DUR,KLOR-CON) 10 MEQ tablet Take 10 mEq by mouth daily.  . rosuvastatin (CRESTOR) 20 MG tablet Take 1 tablet (20 mg total) by mouth daily at 6 PM.  . spironolactone (ALDACTONE) 25 MG tablet Take 1 tablet (25 mg total) by mouth daily.  . ticagrelor (BRILINTA) 90 MG TABS tablet Take 90 mg by mouth 2 (two) times daily.  . vitamin B-12 (CYANOCOBALAMIN) 1000 MCG tablet Take 2,000 mcg by mouth daily.     Allergies:   Codeine; Mirtazapine; and Other   Social History   Social History  . Marital status: Widowed    Spouse name: N/A  . Number of children: N/A  . Years of education: N/A   Social History Main Topics  . Smoking status: Former Smoker    Types: Cigarettes    Start date: 1968  Quit date: 67  . Smokeless tobacco: Never Used  . Alcohol use No  . Drug use: No  . Sexual activity: Not Asked   Other Topics Concern  . None   Social History Narrative  . None     Family Hx: The patient's family history includes COPD in his sister; Congestive Heart Failure in his sister; Diabetes in his mother, sister, sister, and sister; Heart attack in his father and mother; Heart disease in his father and mother; Hypertension in his brother, sister, sister, and sister; Stroke in his father and mother.  ROS:   Please see the history of present illness.    ROS All other systems reviewed and are negative.   EKGs/Labs/Other Test Reviewed:    EKG:  EKG is  ordered today.  The ekg ordered today demonstrates NSR, HR 96, LAD, IVCD, QRS 148 ms, first-degree AV block (PR 264 ms), QTc 452 ms, similar to prior tracing  Recent Labs: 02/12/2017: B Natriuretic Peptide >4,500.0 02/18/2017: ALT 20 02/21/2017: Magnesium 1.7 02/23/2017: BUN 13; Creatinine, Ser 0.80; Hemoglobin 10.4; Platelets 138; Potassium 4.6; Sodium 134   Recent Lipid Panel Lab Results  Component Value Date/Time   CHOL 113 02/12/2017 04:56 AM   TRIG  106 02/12/2017 04:56 AM   HDL 24 (L) 02/12/2017 04:56 AM   CHOLHDL 4.7 02/12/2017 04:56 AM   LDLCALC 68 02/12/2017 04:56 AM    Physical Exam:    VS:  BP 118/70   Pulse 96   Ht 5\' 10"  (1.778 m)   Wt 167 lb 6.4 oz (75.9 kg)   SpO2 97%   BMI 24.02 kg/m     Wt Readings from Last 3 Encounters:  03/08/17 167 lb 6.4 oz (75.9 kg)  02/23/17 158 lb 11.7 oz (72 kg)     Physical Exam  Cardiovascular: Normal rate and regular rhythm.   No murmur heard. Pulmonary/Chest: He has decreased breath sounds in the left lower field. He has no wheezes. He has no rhonchi. He has no rales.  L base with decreased breath sounds but o/w good air movement; no E>>A changes L base  Abdominal: Soft. He exhibits no mass.  Musculoskeletal: He exhibits no edema or deformity (R wrist without hematoma).  Skin: Skin is warm and dry.  Psychiatric: He has a normal mood and affect.    ASSESSMENT:    1. Chronic systolic heart failure ([place])   2. Coronary artery disease involving native coronary artery of native heart without angina pectoris   3. ICD (implantable cardioverter-defibrillator) in place   4. Essential hypertension   5. Hyperlipidemia, unspecified hyperlipidemia type   6. Mediastinal adenopathy    PLAN:    In order of problems listed above:  1. Chronic systolic heart failure ([place]) -  S/p recent admission with a/c systolic HF c/b pleural effusion s/p thoracentesis.  He is doing very well since DC.  He is walking daily and is really NYHA 2.  He is adherent to his current medical Rx. His HR should be lower.  But, he has significant conduction system disease with IVCD, 1st degree AVB.  He tells me that he has back up pacing on his ICD.  However, I am hesitant at this point to adjust his beta-blocker.  Continue current dose of beta-blocker, Digoxin, angiotensin receptor blocker, aldosterone antagonist.  FU with [facility] Clinic next week as planned. BMET, CBC today.   2. Coronary artery disease involving native  coronary artery of native heart without angina pectoris -  S/p  multiple MIs and multiple PCIs.  Hx of stent thrombosis in 2014 c/b CGS.  Now he is s/p DES to the LCx and DES to the RCA during his most recent admission.  He is doing well without angina.  We discussed the importance of dual antiplatelet therapy.  He will remain on Ticagrelor, ASA, beta-blocker, statin.  I will d/w Dr. Haroldine Laws regarding follow up here with Dr. Sherren Mocha for CAD vs comprehensive follow up in the AHF clinic.  For now, plan follow up in 3 mos here with Dr. Sherren Mocha.    3. ICD (implantable cardioverter-defibrillator) in place 2/2 Ischemic CM.  Refer to EP for management.  He may be able to see Dr. Thompson Grayer and follow up in [place].  4. Essential hypertension -  The patient's blood pressure is controlled on his current regimen.  Continue current therapy.   5. Hyperlipidemia, unspecified hyperlipidemia type LDL optimal on most recent lab work.  Continue current Rx.    6. Mediastinal adenopathy - Plan: CBC with Differential/Platelet Likely reactive from pleural effusion.  I will d/w Dr. Glori Bickers +/- follow up CT to further evaluate.   Dispo:  Return in about 3 months (around 06/08/2017) for Routine Follow Up, w/ Dr. Burt Knack.   Medication Adjustments/Labs and Tests Ordered: Current medicines are reviewed at length with the patient today.  Concerns regarding medicines are outlined above.  Tests Ordered: Orders Placed This Encounter  Procedures  . Basic metabolic panel  . CBC with Differential/Platelet  . EKG 12-Lead   Medication Changes: No orders of the defined types were placed in this encounter.   Signed, [place] Dopp, PA-C  03/08/2017 9:32 AM    [facility] [place], [place], [place]  [address] Phone: [phone]; Fax: [phone]

## 2017-03-08 ENCOUNTER — Telehealth: Payer: Self-pay | Admitting: *Deleted

## 2017-03-08 ENCOUNTER — Encounter: Payer: Self-pay | Admitting: Physician Assistant

## 2017-03-08 ENCOUNTER — Ambulatory Visit (INDEPENDENT_AMBULATORY_CARE_PROVIDER_SITE_OTHER): Payer: Medicare Other | Admitting: Physician Assistant

## 2017-03-08 VITALS — BP 118/70 | HR 96 | Ht 70.0 in | Wt 167.4 lb

## 2017-03-08 DIAGNOSIS — I5022 Chronic systolic (congestive) heart failure: Secondary | ICD-10-CM

## 2017-03-08 DIAGNOSIS — I10 Essential (primary) hypertension: Secondary | ICD-10-CM | POA: Diagnosis not present

## 2017-03-08 DIAGNOSIS — R590 Localized enlarged lymph nodes: Secondary | ICD-10-CM

## 2017-03-08 DIAGNOSIS — Z95810 Presence of automatic (implantable) cardiac defibrillator: Secondary | ICD-10-CM

## 2017-03-08 DIAGNOSIS — I2510 Atherosclerotic heart disease of native coronary artery without angina pectoris: Secondary | ICD-10-CM | POA: Diagnosis not present

## 2017-03-08 DIAGNOSIS — G4733 Obstructive sleep apnea (adult) (pediatric): Secondary | ICD-10-CM

## 2017-03-08 DIAGNOSIS — E785 Hyperlipidemia, unspecified: Secondary | ICD-10-CM | POA: Diagnosis not present

## 2017-03-08 HISTORY — DX: Hyperlipidemia, unspecified: E78.5

## 2017-03-08 HISTORY — DX: Obstructive sleep apnea (adult) (pediatric): G47.33

## 2017-03-08 LAB — CBC WITH DIFFERENTIAL/PLATELET
BASOS ABS: 0.1 10*3/uL (ref 0.0–0.2)
BASOS: 1 %
EOS (ABSOLUTE): 0.7 10*3/uL — ABNORMAL HIGH (ref 0.0–0.4)
Eos: 9 %
HEMATOCRIT: 32.1 % — AB (ref 37.5–51.0)
HEMOGLOBIN: 10.7 g/dL — AB (ref 13.0–17.7)
Immature Grans (Abs): 0 10*3/uL (ref 0.0–0.1)
Immature Granulocytes: 0 %
LYMPHS ABS: 2.6 10*3/uL (ref 0.7–3.1)
Lymphs: 36 %
MCH: 28.5 pg (ref 26.6–33.0)
MCHC: 33.3 g/dL (ref 31.5–35.7)
MCV: 85 fL (ref 79–97)
MONOCYTES: 14 %
Monocytes Absolute: 1 10*3/uL — ABNORMAL HIGH (ref 0.1–0.9)
NEUTROS ABS: 2.8 10*3/uL (ref 1.4–7.0)
Neutrophils: 40 %
Platelets: 280 10*3/uL (ref 150–379)
RBC: 3.76 x10E6/uL — ABNORMAL LOW (ref 4.14–5.80)
RDW: 18.6 % — ABNORMAL HIGH (ref 12.3–15.4)
WBC: 7.1 10*3/uL (ref 3.4–10.8)

## 2017-03-08 LAB — BASIC METABOLIC PANEL
BUN / CREAT RATIO: 19 (ref 10–24)
BUN: 15 mg/dL (ref 8–27)
CHLORIDE: 96 mmol/L (ref 96–106)
CO2: 24 mmol/L (ref 20–29)
Calcium: 9.5 mg/dL (ref 8.6–10.2)
Creatinine, Ser: 0.78 mg/dL (ref 0.76–1.27)
GFR, EST AFRICAN AMERICAN: 112 mL/min/{1.73_m2} (ref >59)
GFR, EST NON AFRICAN AMERICAN: 97 mL/min/{1.73_m2} (ref >59)
Glucose: 121 mg/dL — ABNORMAL HIGH (ref 65–99)
POTASSIUM: 4 mmol/L (ref 3.5–5.2)
Sodium: 138 mmol/L (ref 134–144)

## 2017-03-08 NOTE — Telephone Encounter (Signed)
Pt has been notified of lab results by phone with verbal understanding. Pt thanked me for my call.  

## 2017-03-08 NOTE — Patient Instructions (Addendum)
Medication Instructions:  No changes.   Labwork: Today - BMET CBC  Testing/Procedures: None   Follow-Up: Dr. Glori Bickers with the [facility] 03/17/17 as planned.  Your physician wants you to follow-up in: Dr. Sherren Mocha at [address] ([place]) in 3 months.  You will receive a reminder letter in the mail two months in advance. If you don't receive a letter, please call our office to schedule the follow-up appointment.  YOU ARE BEING REFERRED TO DR. ALLRED HERE IN OUR OFFICE. I WILL HAVE MELISSA TATUM, EP SCHEDULER CALL YOU WITH AN APPT Any Other Special Instructions Will Be Listed Below (If Applicable).  If you need a refill on your cardiac medications before your next appointment, please call your pharmacy.

## 2017-03-08 NOTE — Telephone Encounter (Signed)
-----   Message from Liliane Shi, [place] sent at 03/08/2017  4:40 PM EDT ----- Please call the patient The Kidney function is normal. The hemoglobin is stable. Continue with current treatment plan. [place] Dopp, PA-C   03/08/2017 4:40 PM

## 2017-03-13 ENCOUNTER — Telehealth: Payer: Self-pay | Admitting: Physician Assistant

## 2017-03-13 NOTE — Telephone Encounter (Signed)
Please let patient know that I discussed his case with Dr. Glori Bickers. Dr. Haroldine Laws will follow everything for him in the [facility] Clinic (except for his defibrillator). So, keep the appt with him next week. We can cancel the follow up with Dr. Burt Knack at [facility].  in 3 months. He will still need to be referred to Dr. Thompson Grayer to manage his defibrillator. Thanks, [place] Dopp, PA-C    03/13/2017 8:28 PM

## 2017-03-14 NOTE — Telephone Encounter (Signed)
Lmtcb x 2 to go over recommendations per PA.

## 2017-03-14 NOTE — Telephone Encounter (Signed)
Lmtcb to go over recommendation per [place] Dopp, [facility] keep Dr. Haroldine Laws at the A[facility]. Pt will need to be referred to Dr. Rayann Heman due to pt has device. Message was sent to Lorenda Hatchet, EP Scheduler to schedule pt with Dr. Rayann Heman. Pt will not need to f/u Dr. Burt Knack.

## 2017-03-17 ENCOUNTER — Encounter (HOSPITAL_COMMUNITY): Payer: Self-pay

## 2017-03-17 ENCOUNTER — Ambulatory Visit (HOSPITAL_COMMUNITY)
Admission: RE | Admit: 2017-03-17 | Discharge: 2017-03-17 | Disposition: A | Discharge status: Home / Self Care | Payer: Medicare Other | Source: Ambulatory Visit | Attending: Cardiology | Admitting: Cardiology

## 2017-03-17 VITALS — BP 106/80 | HR 81 | Wt 167.5 lb

## 2017-03-17 DIAGNOSIS — I252 Old myocardial infarction: Secondary | ICD-10-CM | POA: Diagnosis not present

## 2017-03-17 DIAGNOSIS — I5022 Chronic systolic (congestive) heart failure: Secondary | ICD-10-CM | POA: Diagnosis not present

## 2017-03-17 DIAGNOSIS — Z823 Family history of stroke: Secondary | ICD-10-CM | POA: Diagnosis not present

## 2017-03-17 DIAGNOSIS — Z7982 Long term (current) use of aspirin: Secondary | ICD-10-CM | POA: Insufficient documentation

## 2017-03-17 DIAGNOSIS — Z95810 Presence of automatic (implantable) cardiac defibrillator: Secondary | ICD-10-CM | POA: Diagnosis not present

## 2017-03-17 DIAGNOSIS — Z8249 Family history of ischemic heart disease and other diseases of the circulatory system: Secondary | ICD-10-CM | POA: Diagnosis not present

## 2017-03-17 DIAGNOSIS — G4733 Obstructive sleep apnea (adult) (pediatric): Secondary | ICD-10-CM | POA: Diagnosis not present

## 2017-03-17 DIAGNOSIS — Z833 Family history of diabetes mellitus: Secondary | ICD-10-CM | POA: Diagnosis not present

## 2017-03-17 DIAGNOSIS — I255 Ischemic cardiomyopathy: Secondary | ICD-10-CM | POA: Diagnosis not present

## 2017-03-17 DIAGNOSIS — I2510 Atherosclerotic heart disease of native coronary artery without angina pectoris: Secondary | ICD-10-CM | POA: Insufficient documentation

## 2017-03-17 DIAGNOSIS — I110 Hypertensive heart disease with heart failure: Secondary | ICD-10-CM | POA: Diagnosis not present

## 2017-03-17 DIAGNOSIS — Z955 Presence of coronary angioplasty implant and graft: Secondary | ICD-10-CM | POA: Diagnosis not present

## 2017-03-17 DIAGNOSIS — E039 Hypothyroidism, unspecified: Secondary | ICD-10-CM | POA: Insufficient documentation

## 2017-03-17 DIAGNOSIS — J449 Chronic obstructive pulmonary disease, unspecified: Secondary | ICD-10-CM

## 2017-03-17 DIAGNOSIS — J9611 Chronic respiratory failure with hypoxia: Secondary | ICD-10-CM | POA: Diagnosis not present

## 2017-03-17 DIAGNOSIS — Z885 Allergy status to narcotic agent status: Secondary | ICD-10-CM | POA: Diagnosis not present

## 2017-03-17 DIAGNOSIS — Z87891 Personal history of nicotine dependence: Secondary | ICD-10-CM | POA: Diagnosis not present

## 2017-03-17 DIAGNOSIS — E785 Hyperlipidemia, unspecified: Secondary | ICD-10-CM | POA: Diagnosis not present

## 2017-03-17 DIAGNOSIS — Z888 Allergy status to other drugs, medicaments and biological substances status: Secondary | ICD-10-CM | POA: Diagnosis not present

## 2017-03-17 DIAGNOSIS — R590 Localized enlarged lymph nodes: Secondary | ICD-10-CM | POA: Insufficient documentation

## 2017-03-17 LAB — BASIC METABOLIC PANEL
Anion gap: 12 (ref 5–15)
BUN: 16 mg/dL (ref 6–20)
CHLORIDE: 103 mmol/L (ref 101–111)
CO2: 22 mmol/L (ref 22–32)
CREATININE: 0.87 mg/dL (ref 0.61–1.24)
Calcium: 8.9 mg/dL (ref 8.9–10.3)
GFR calc non Af Amer: >60 mL/min (ref >60)
Glucose, Bld: 152 mg/dL — ABNORMAL HIGH (ref 65–99)
Potassium: 3.4 mmol/L — ABNORMAL LOW (ref 3.5–5.1)
SODIUM: 137 mmol/L (ref 135–145)

## 2017-03-17 LAB — DIGOXIN LEVEL: Digoxin Level: 1.2 ng/mL (ref 0.8–2.0)

## 2017-03-17 MED ORDER — POTASSIUM CHLORIDE CRYS ER 10 MEQ PO TBCR: 10.0000 meq | EXTENDED_RELEASE_TABLET | Freq: Every day | ORAL | 3 refills | Status: DC

## 2017-03-17 MED ORDER — CARVEDILOL 3.125 MG PO TABS: 3.1250 mg | ORAL_TABLET | Freq: Two times a day (BID) | ORAL | 3 refills | Status: DC

## 2017-03-17 MED ORDER — LOSARTAN POTASSIUM 25 MG PO TABS: 25.0000 mg | ORAL_TABLET | Freq: Two times a day (BID) | ORAL | 3 refills | Status: DC

## 2017-03-17 MED ORDER — ROSUVASTATIN CALCIUM 20 MG PO TABS: 20.0000 mg | ORAL_TABLET | Freq: Every day | ORAL | 3 refills | Status: DC

## 2017-03-17 MED ORDER — SPIRONOLACTONE 25 MG PO TABS: 25.0000 mg | ORAL_TABLET | Freq: Every day | ORAL | 3 refills | Status: DC

## 2017-03-17 MED ORDER — FUROSEMIDE 40 MG PO TABS: 40.0000 mg | ORAL_TABLET | Freq: Every day | ORAL | 3 refills | Status: DC

## 2017-03-17 MED ORDER — TICAGRELOR 90 MG PO TABS: 90.0000 mg | ORAL_TABLET | Freq: Two times a day (BID) | ORAL | 3 refills | Status: DC

## 2017-03-17 MED ORDER — DIGOXIN 125 MCG PO TABS: 0.1250 mg | ORAL_TABLET | Freq: Every day | ORAL | 3 refills | Status: DC

## 2017-03-17 NOTE — Progress Notes (Signed)
Advanced Heart Failure Medication Review by a Pharmacist  Does the patient  feel that his/her medications are working for him/her?  no  Has the patient been experiencing any side effects to the medications prescribed?  no  Does the patient measure his/her own blood pressure or blood glucose at home?  no   Does the patient have any problems obtaining medications due to transportation or finances?   No - will be going to the [facility]  Understanding of regimen: good Understanding of indications: good Potential of compliance: good Patient understands to avoid NSAIDs. Patient understands to avoid decongestants.  Issues to address at subsequent visits: None   Pharmacist comments: Stephen Cardenas is a pleasant 61 yo M presenting without a medication list but with fair recall of his regimen. He reports good compliance with his regimen and did not have any specific medication-related questions or concerns for me at this time.   Ruta Hinds. Velva Harman, PharmD, BCPS, CPP Clinical Pharmacist Pager: [phone] Phone: [phone] 03/17/2017 10:17 AM      Time with patient: 10 minutes Preparation and documentation time: 2 minutes Total time: 12 minutes

## 2017-03-17 NOTE — Patient Instructions (Signed)
Labs today (will call for abnormal results, otherwise no news is good news)  Follow up with Dr. Haroldine Laws in 6 weeks

## 2017-03-17 NOTE — Progress Notes (Signed)
Advanced Heart Failure Clinic Note   Referring Physician: Dr. Orpah Greek, [facility] Primary Care: Dr. Clementeen Graham Primary Cardiologist: New to Dr. Haroldine Laws.   HPI: Stephen Cardenas is a 61 year old male with a past medical history of chronic systolic CHF (EF 40%) due to ischemic cardiomyopathy with a history of MI. AICD, COPD, OSA on CPAP, HTN, and hypothyroidism.   He has a history of multiple MI's. One in 2007 and one in 2014 that resulted in cardiogenic shock and a prolonged ICU hospitalization.   He was admitted to [facility] in June 2018 with hypoglycemia and discharged to Rehab. He was discharged from [facility] but quickly readmitted to [facility] with chest pain and SOB. He was told that he had a fluid on his lung and then transferred back to rehab and eventually discharged on 02/07/17. He presented to the ED at [facility] on 02/09/17 with SOB and weakness. Chest X ray with large pleural effusion, he was therefore transferred to [facility] where he underwent a left thoracentesis with transudative fluid.   He underwent repeat right and left heart cath on 02/14/17 that showed severe 3 vessel disease with multiple areas of in-stent restenosis. Right heart with Fick output/index 5.9/3.1. He underwent DES placement x 2 to LCx and mid - RCA. Had a 24-hour Thallium viability study with minimal to no viability in all segments except for the septum. Discharge weight was 158 pounds.   He presents today for HF follow up. Overall feeling well, he denies SOB with activity. He does get tired easily, but is slowly increasing his activity. His weights at home have been consistently 162 pounds. He is taking all of his medications. Following a low salt diet, drinking less than 2L a day. Denies ETOH and tobacco abuse. Denies chest pain, palpitations.    Review of Systems: [y] = yes, [ ]  = no   General: Weight gain [ ] ; Weight loss [ ] ; Anorexia [ ] ; Fatigue [ y]; Fever [ ] ; Chills [ ] ; Weakness [ ]   Cardiac:  Chest pain/pressure [ ] ; Resting SOB [ ] ; Exertional SOB [ ] ; Orthopnea [ ] ; Pedal Edema [ ] ; Palpitations [ ] ; Syncope [ ] ; Presyncope [ ] ; Paroxysmal nocturnal dyspnea[ ]   Pulmonary: Cough [ ] ; Wheezing[ ] ; Hemoptysis[ ] ; Sputum [ ] ; Snoring [ ]   GI: Vomiting[ ] ; Dysphagia[ ] ; Melena[ ] ; Hematochezia [ ] ; Heartburn[ ] ; Abdominal pain [ ] ; Constipation [ ] ; Diarrhea [ ] ; BRBPR [ ]   GU: Hematuria[ ] ; Dysuria [ ] ; Nocturia[ ]   Vascular: Pain in legs with walking [ ] ; Pain in feet with lying flat [ ] ; Non-healing sores [ ] ; Stroke [ ] ; TIA [ ] ; Slurred speech [ ] ;  Neuro: Headaches[ ] ; Vertigo[ ] ; Seizures[ ] ; Paresthesias[ ] ;Blurred vision [ ] ; Diplopia [ ] ; Vision changes [ ]   Ortho/Skin: Arthritis [ y]; Joint pain [ ] ; Muscle pain [ ] ; Joint swelling [ ] ; Back Pain [ ] ; Rash [ ]   Psych: Depression[ ] ; Anxiety[ ]   Heme: Bleeding problems [ ] ; Clotting disorders [ ] ; Anemia [ ]   Endocrine: Diabetes [ ] ; Thyroid dysfunction[ ]    Past Medical History:  Diagnosis Date  . AICD (automatic cardioverter/defibrillator) present 2014  . CAD (coronary artery disease)    S/p multiple prior MIs/PCIs // amdx in 2014 with acute stent thrombosis c/b CGS // Admx to [facility] in 08/270 2/2 a/c systolic HF >> [facility] 12/16/64: mLAD 95 ISR, dLAD 30, D2 99; OM2 95 ISR; pRCA 30/50, mRCA 90/80,  dRCA 30 EF 15 >> min to no viability on Thallium study (not surgical candidate) - PCI>> 3.5 x 20 mm Promus DES to the LCx 3 x 38 mm Promus DES to the proximal mid RCA  . Chronic systolic heart failure (HCC)    2/2 to ischemic CM - Echo 02/12/17:  Mild LVH, EF 15-20, mod to severe MR, severe LAE, mild RVE, mod reduced RVSF, mild RAE, mod TR, PASP 65  . COPD (chronic obstructive pulmonary disease) ([place])   . History of MI (myocardial infarction)   . History of pneumonia   . HTN (hypertension)   . Hyperlipidemia 03/08/2017  . Hypothyroidism   . OSA (obstructive sleep apnea) 03/08/2017    Current Outpatient Prescriptions  Medication Sig  Dispense Refill  . aspirin EC 81 MG tablet Take 81 mg by mouth daily.    . carvedilol (COREG) 3.125 MG tablet Take 1 tablet (3.125 mg total) by mouth 2 (two) times daily with a meal. 60 tablet 0  . Cholecalciferol (VITAMIN D3) 2000 units TABS Take 2,000 Units by mouth daily.    . digoxin (LANOXIN) 0.125 MG tablet Take 1 tablet (0.125 mg total) by mouth daily. 30 tablet 0  . DULoxetine (CYMBALTA) 60 MG capsule Take 60 mg by mouth daily.    . ferrous sulfate 325 (65 FE) MG tablet Take 1 tablet (325 mg total) by mouth 2 (two) times daily with a meal. 60 tablet 3  . furosemide (LASIX) 40 MG tablet Take 40 mg by mouth daily.    Marland Kitchen ipratropium-albuterol (DUONEB) 0.5-2.5 (3) MG/3ML SOLN Take 3 mLs by nebulization every 6 (six) hours as needed (for shortness of breath). 360 mL 0  . levothyroxine (SYNTHROID, LEVOTHROID) 150 MCG tablet Take 150 mcg by mouth daily before breakfast.    . losartan (COZAAR) 25 MG tablet Take 1 tablet (25 mg total) by mouth 2 (two) times daily. 60 tablet 0  . Multiple Vitamins-Minerals (MULTIVITAMIN PO) Take 1 tablet by mouth daily.    . pantoprazole (PROTONIX) 40 MG tablet Take 1 tablet (40 mg total) by mouth at bedtime. 30 tablet 0  . potassium chloride (K-DUR,KLOR-CON) 10 MEQ tablet Take 10 mEq by mouth daily.    . rosuvastatin (CRESTOR) 20 MG tablet Take 1 tablet (20 mg total) by mouth daily at 6 PM. 30 tablet 0  . spironolactone (ALDACTONE) 25 MG tablet Take 1 tablet (25 mg total) by mouth daily. 30 tablet 0  . ticagrelor (BRILINTA) 90 MG TABS tablet Take 90 mg by mouth 2 (two) times daily.    . vitamin B-12 (CYANOCOBALAMIN) 1000 MCG tablet Take 2,000 mcg by mouth daily.    Marland Kitchen ALPRAZolam (XANAX) 0.5 MG tablet Take 0.5 mg by mouth at bedtime as needed for anxiety.     No current facility-administered medications for this encounter.     Allergies  Allergen Reactions  . Codeine Other (See Comments) and Nausea Only    Increased work of breathing, diaphoretic  .  Mirtazapine     Other reaction(s): Other - See Comments  . Other     Other reaction(s): Other - See Comments      Social History   Social History  . Marital status: Widowed    Spouse name: N/A  . Number of children: N/A  . Years of education: N/A   Occupational History  . Not on file.   Social History Main Topics  . Smoking status: Former Smoker    Types: Cigarettes    Start  date: 69    Quit date: 1996  . Smokeless tobacco: Never Used  . Alcohol use No  . Drug use: No  . Sexual activity: Not on file   Other Topics Concern  . Not on file   Social History Narrative  . No narrative on file      Family History  Problem Relation Age of Onset  . Diabetes Mother   . Heart disease Mother   . Stroke Mother   . Heart attack Mother   . Heart disease Father   . Heart attack Father   . Stroke Father   . COPD Sister   . Congestive Heart Failure Sister   . Hypertension Sister   . Diabetes Sister   . Hypertension Brother   . Diabetes Sister   . Hypertension Sister   . Diabetes Sister   . Hypertension Sister     Vitals:   03/17/17 1000  BP: 106/80  Pulse: 81  SpO2: 99%  Weight: 167 lb 8 oz (76 kg)     PHYSICAL EXAM: General:  Well appearing. No respiratory difficulty HEENT: normal Neck: supple. no JVD. Carotids 2+ bilat; no bruits. No lymphadenopathy or thyromegaly appreciated. Cor: PMI nondisplaced. Regular rate & rhythm. No rubs, gallops or murmurs. Lungs: clear Abdomen: soft, nontender, nondistended. No hepatosplenomegaly. No bruits or masses. Good bowel sounds. Extremities: no cyanosis, clubbing, rash, edema Neuro: alert & oriented x 3, cranial nerves grossly intact. moves all 4 extremities w/o difficulty. Affect pleasant.     ASSESSMENT & PLAN:  1.Chronic systolic CHF: ICM, EF 63-33% June 2018.  - NYHA II - Volume status stable on exam.  - Continue lasix 40 mg daily.  - Continue Spiro 25 mg daily - Continue digoxin 0.125 mg, will get dig  level today.  - Continue losartan, switch to Entresto next visit if BP stable.  - Continue Coreg 3.125 mg BID.    2. CAD s/p previous MI - Cath 7/1 with severe 3 v CAD with multiple areas of ISR - s/p 2v PCI 02/22/17 by Dr. Burt Knack.  - Suspect anterior wall is non-viable given previous LAD stent thrombosis.  - 24-hour Thallium viability study reviewed personally and minimal or no viability in all segments except for septum - Continue ASA, Brilinta and Crestor.   3.  Chronic hypoxic respiratory failure with large left pleural effusion - Per report report tap was bloody but effusion is transudative by Light's criteria. Cytology only with mesothelial cells commonly seen in HF.  - Repeat CT 6/30 showed moderate residual effusion.  - F/u C/X on 7/5 with small to moderate residual effusion. - Stable.    4. COPD - Stable.  - Follows with PCP, no wheeze.   5. Mediastinal lymphadenopathy on chest CT - Will defer to Dr. Haroldine Laws next visit for need for PET scan   Follow up in 2 months with Dr. Haroldine Laws. Check BMET and Dig level today.    Arbutus Leas, NP 03/17/17

## 2017-03-22 ENCOUNTER — Encounter: Payer: Self-pay | Admitting: Internal Medicine

## 2017-04-22 ENCOUNTER — Encounter: Payer: Self-pay | Admitting: Internal Medicine

## 2017-04-22 ENCOUNTER — Ambulatory Visit (INDEPENDENT_AMBULATORY_CARE_PROVIDER_SITE_OTHER): Payer: Medicare Other | Admitting: Internal Medicine

## 2017-04-22 VITALS — BP 118/82 | HR 96 | Ht 70.0 in | Wt 173.0 lb

## 2017-04-22 DIAGNOSIS — Z95810 Presence of automatic (implantable) cardiac defibrillator: Secondary | ICD-10-CM | POA: Diagnosis not present

## 2017-04-22 DIAGNOSIS — I5022 Chronic systolic (congestive) heart failure: Secondary | ICD-10-CM | POA: Diagnosis not present

## 2017-04-22 DIAGNOSIS — I255 Ischemic cardiomyopathy: Secondary | ICD-10-CM

## 2017-04-22 DIAGNOSIS — I2589 Other forms of chronic ischemic heart disease: Secondary | ICD-10-CM

## 2017-04-22 NOTE — Patient Instructions (Signed)
Medication Instructions:  Your physician recommends that you continue on your current medications as directed. Please refer to the Current Medication list given to you today.   Labwork: None ordered   Testing/Procedures: None ordered   Follow-Up: Your physician wants you to follow-up in: 12 months with Dr Rayann Heman in [place] will receive a reminder letter in the mail two months in advance. If you don't receive a letter, please call our office to schedule the follow-up appointment.  Remote monitoring is used to monitor your ICD from home. This monitoring reduces the number of office visits required to check your device to one time per year. It allows [place] to keep an eye on the functioning of your device to ensure it is working properly. You are scheduled for a device check from home on 07/22/17. You may send your transmission at any time that day. If you have a wireless device, the transmission will be sent automatically. After your physician reviews your transmission, you will receive a postcard with your next transmission date.   Enroll in ICM clinic----Laurie Short, RN will call you to begin   Any Other Special Instructions Will Be Listed Below (If Applicable).     If you need a refill on your cardiac medications before your next appointment, please call your pharmacy.

## 2017-04-22 NOTE — Progress Notes (Signed)
Primary Cardiologist:  Dr Sabra Heck [facility]) CHF:  Bensimhon EP: Arihana Ambrocio  Stephen Cardenas is a 61 y.o. male with a h/o ischemic CM/ chronic systolic dysfunction, and CAD s/p ICD (MDT) at the [facility] who presents today to establish care in the Electrophysiology device clinic.  He recently had advanced CHF (CHF clinic and [facility] notes reviewed).  He has made good progress.  He has decided to have EP care with our office and presents today to establish. The patient reports doing very well since having a pacemaker implanted and remains very active despite his age.   Today, he  denies symptoms of palpitations, chest pain, shortness of breath, orthopnea, PND, lower extremity edema, dizziness, presyncope, syncope, or neurologic sequela.  The patientis tolerating medications without difficulties and is otherwise without complaint today.   Past Medical History:  Diagnosis Date  . AICD (automatic cardioverter/defibrillator) present 2014  . CAD (coronary artery disease)    S/p multiple prior MIs/PCIs // amdx in 2014 with acute stent thrombosis c/b CGS // Admx to [facility] in 11/1960 2/2 a/c systolic HF >> [facility] 09/18/95: mLAD 95 ISR, dLAD 30, D2 99; OM2 95 ISR; pRCA 30/50, mRCA 90/80, dRCA 30 EF 15 >> min to no viability on Thallium study (not surgical candidate) - PCI>> 3.5 x 20 mm Promus DES to the LCx 3 x 38 mm Promus DES to the proximal mid RCA  . Chronic systolic heart failure (HCC)    2/2 to ischemic CM - Echo 02/12/17:  Mild LVH, EF 15-20, mod to severe MR, severe LAE, mild RVE, mod reduced RVSF, mild RAE, mod TR, PASP 65  . COPD (chronic obstructive pulmonary disease) ([place])   . History of MI (myocardial infarction)   . History of pneumonia   . HTN (hypertension)   . Hyperlipidemia 03/08/2017  . Hypothyroidism   . OSA (obstructive sleep apnea) 03/08/2017   Past Surgical History:  Procedure Laterality Date  . CARDIAC DEFIBRILLATOR PLACEMENT  2014   Medtronic Every XT DR ICD implanted at the  [facility] for primary prevention  . CORONARY STENT INTERVENTION N/A 02/22/2017   Procedure: Coronary Stent Intervention;  Surgeon: Sherren Mocha, MD;  Location: [facility];  Service: Cardiovascular;  Laterality: N/A;  CFX and RCA  . FACIAL RECONSTRUCTION SURGERY Right 1984  . FEMUR FRACTURE SURGERY Right 1984  . RIGHT/LEFT HEART CATH AND CORONARY ANGIOGRAPHY N/A 02/14/2017   Procedure: Right/Left Heart Cath and Coronary Angiography;  Surgeon: Jolaine Artist, MD;  Location: [facility];  Service: Cardiovascular;  Laterality: N/A;  . WRIST FRACTURE SURGERY Right 2015    Social History   Social History  . Marital status: Widowed    Spouse name: N/A  . Number of children: N/A  . Years of education: N/A   Occupational History  . Not on file.   Social History Main Topics  . Smoking status: Former Smoker    Types: Cigarettes    Start date: 1968    Quit date: 1996  . Smokeless tobacco: Never Used  . Alcohol use No  . Drug use: No  . Sexual activity: Not on file   Other Topics Concern  . Not on file   Social History Narrative   Lives in [place]   Attends [employer]   Disabled carpenter    Family History  Problem Relation Age of Onset  . Diabetes Mother   . Heart disease Mother   . Stroke Mother   .  Heart attack Mother   . Heart disease Father   . Heart attack Father   . Stroke Father   . COPD Sister   . Congestive Heart Failure Sister   . Hypertension Sister   . Diabetes Sister   . Hypertension Brother   . Diabetes Sister   . Hypertension Sister   . Diabetes Sister   . Hypertension Sister     Allergies  Allergen Reactions  . Codeine Other (See Comments) and Nausea Only    Increased work of breathing, diaphoretic  . Mirtazapine     Other reaction(s): Other - See Comments  . Other     Other reaction(s): Other - See Comments    Current Outpatient Prescriptions  Medication Sig Dispense Refill  . ALPRAZolam (XANAX) 0.5 MG  tablet Take 0.5 mg by mouth at bedtime as needed for anxiety.    Marland Kitchen aspirin EC 81 MG tablet Take 81 mg by mouth daily.    . carvedilol (COREG) 3.125 MG tablet Take 1 tablet (3.125 mg total) by mouth 2 (two) times daily with a meal. 60 tablet 3  . Cholecalciferol (VITAMIN D3) 2000 units TABS Take 2,000 Units by mouth daily.    . digoxin (LANOXIN) 0.125 MG tablet Take 1 tablet (0.125 mg total) by mouth daily. 30 tablet 3  . DULoxetine (CYMBALTA) 60 MG capsule Take 60 mg by mouth daily.    . ferrous sulfate 325 (65 FE) MG tablet Take 1 tablet (325 mg total) by mouth 2 (two) times daily with a meal. 60 tablet 3  . furosemide (LASIX) 40 MG tablet Take 1 tablet (40 mg total) by mouth daily. 30 tablet 3  . ipratropium-albuterol (DUONEB) 0.5-2.5 (3) MG/3ML SOLN Take 3 mLs by nebulization every 6 (six) hours as needed (for shortness of breath). 360 mL 0  . levothyroxine (SYNTHROID, LEVOTHROID) 150 MCG tablet Take 150 mcg by mouth daily before breakfast.    . losartan (COZAAR) 25 MG tablet Take 1 tablet (25 mg total) by mouth 2 (two) times daily. 60 tablet 3  . Multiple Vitamins-Minerals (MULTIVITAMIN PO) Take 1 tablet by mouth daily.    . pantoprazole (PROTONIX) 40 MG tablet Take 1 tablet (40 mg total) by mouth at bedtime. 30 tablet 0  . potassium chloride (K-DUR,KLOR-CON) 10 MEQ tablet Take 1 tablet (10 mEq total) by mouth daily. 30 tablet 3  . rosuvastatin (CRESTOR) 20 MG tablet Take 1 tablet (20 mg total) by mouth daily at 6 PM. 30 tablet 3  . spironolactone (ALDACTONE) 25 MG tablet Take 1 tablet (25 mg total) by mouth daily. 30 tablet 3  . ticagrelor (BRILINTA) 90 MG TABS tablet Take 1 tablet (90 mg total) by mouth 2 (two) times daily. 60 tablet 3  . vitamin B-12 (CYANOCOBALAMIN) 1000 MCG tablet Take 2,000 mcg by mouth daily.     No current facility-administered medications for this visit.     ROS- all systems are reviewed and negative except as per HPI  Physical Exam: Vitals:   04/22/17 1408    BP: 118/82  Pulse: 96  Weight: 173 lb (78.5 kg)  Height: 5\' 10"  (1.778 m)    GEN- The patient is well appearing, alert and oriented x 3 today.   Head- normocephalic, atraumatic Eyes-  Sclera clear, conjunctiva pink Ears- hearing intact Oropharynx- clear Neck- supple, no JVP Lymph- no cervical lymphadenopathy Lungs- Clear to ausculation bilaterally, normal work of breathing Chest- ICD pocket is well healed Heart- Regular rate and rhythm, no murmurs, rubs  or gallops, PMI not laterally displaced GI- soft, NT, ND, + BS Extremities- no clubbing, cyanosis, or edema MS- no significant deformity or atrophy Skin- no rash or lesion Psych- euthymic mood, full affect Neuro- strength and sensation are intact  ICD interrogation- reviewed in detail today,  See PACEART report Recent ekg reveales sinus rhythm with first degree AV block and IVCD with QRS > 150 msec   Assessment and Plan:  1. Ischemic CM/ chronic  Systolic dysfunction The patient has established with our advanced heart failure team. Currently doing better. Normal ICD function See Claudia Desanctis Art report No changes today Will enroll in ICM device clinic  2. First degree AV block/ IVCD Will follow pacemaker I worry that with his advanced conduction system disease that his V pacing may increase over time.  Would consider CRT upgrade at that time.  No changes today  carelink Return to see me in a year unless problems arise in the interim.  Thompson Grayer MD, [facility] 04/22/2017 2:54 PM

## 2017-04-29 ENCOUNTER — Encounter (HOSPITAL_COMMUNITY): Payer: Medicare Other | Admitting: Internal Medicine

## 2017-04-29 ENCOUNTER — Encounter (HOSPITAL_COMMUNITY): Payer: Self-pay | Admitting: Internal Medicine

## 2017-04-29 ENCOUNTER — Ambulatory Visit (HOSPITAL_COMMUNITY)
Admission: RE | Admit: 2017-04-29 | Discharge: 2017-04-29 | Disposition: A | Discharge status: Home / Self Care | Payer: Medicare Other | Source: Ambulatory Visit | Attending: Internal Medicine | Admitting: Internal Medicine

## 2017-04-29 VITALS — BP 126/84 | HR 77 | Wt 173.8 lb

## 2017-04-29 DIAGNOSIS — I5022 Chronic systolic (congestive) heart failure: Secondary | ICD-10-CM | POA: Diagnosis not present

## 2017-04-29 DIAGNOSIS — E162 Hypoglycemia, unspecified: Secondary | ICD-10-CM | POA: Insufficient documentation

## 2017-04-29 DIAGNOSIS — I2510 Atherosclerotic heart disease of native coronary artery without angina pectoris: Secondary | ICD-10-CM | POA: Diagnosis not present

## 2017-04-29 DIAGNOSIS — R590 Localized enlarged lymph nodes: Secondary | ICD-10-CM | POA: Diagnosis not present

## 2017-04-29 DIAGNOSIS — Z87891 Personal history of nicotine dependence: Secondary | ICD-10-CM | POA: Diagnosis not present

## 2017-04-29 DIAGNOSIS — J9611 Chronic respiratory failure with hypoxia: Secondary | ICD-10-CM | POA: Insufficient documentation

## 2017-04-29 DIAGNOSIS — Z95810 Presence of automatic (implantable) cardiac defibrillator: Secondary | ICD-10-CM | POA: Diagnosis not present

## 2017-04-29 DIAGNOSIS — I252 Old myocardial infarction: Secondary | ICD-10-CM | POA: Insufficient documentation

## 2017-04-29 DIAGNOSIS — G4733 Obstructive sleep apnea (adult) (pediatric): Secondary | ICD-10-CM | POA: Insufficient documentation

## 2017-04-29 DIAGNOSIS — I255 Ischemic cardiomyopathy: Secondary | ICD-10-CM | POA: Diagnosis not present

## 2017-04-29 DIAGNOSIS — Z9889 Other specified postprocedural states: Secondary | ICD-10-CM | POA: Insufficient documentation

## 2017-04-29 DIAGNOSIS — J449 Chronic obstructive pulmonary disease, unspecified: Secondary | ICD-10-CM | POA: Diagnosis not present

## 2017-04-29 DIAGNOSIS — E785 Hyperlipidemia, unspecified: Secondary | ICD-10-CM | POA: Insufficient documentation

## 2017-04-29 DIAGNOSIS — E039 Hypothyroidism, unspecified: Secondary | ICD-10-CM | POA: Insufficient documentation

## 2017-04-29 DIAGNOSIS — Z955 Presence of coronary angioplasty implant and graft: Secondary | ICD-10-CM | POA: Insufficient documentation

## 2017-04-29 DIAGNOSIS — I110 Hypertensive heart disease with heart failure: Secondary | ICD-10-CM | POA: Diagnosis not present

## 2017-04-29 LAB — BASIC METABOLIC PANEL
ANION GAP: 9 (ref 5–15)
BUN: 13 mg/dL (ref 6–20)
CO2: 21 mmol/L — ABNORMAL LOW (ref 22–32)
Calcium: 9.5 mg/dL (ref 8.9–10.3)
Chloride: 102 mmol/L (ref 101–111)
Creatinine, Ser: 1 mg/dL (ref 0.61–1.24)
Glucose, Bld: 125 mg/dL — ABNORMAL HIGH (ref 65–99)
Potassium: 3.8 mmol/L (ref 3.5–5.1)
Sodium: 132 mmol/L — ABNORMAL LOW (ref 135–145)

## 2017-04-29 MED ORDER — PANTOPRAZOLE SODIUM 40 MG PO TBEC: 40.0000 mg | DELAYED_RELEASE_TABLET | Freq: Every day | ORAL | 3 refills | Status: DC

## 2017-04-29 MED ORDER — SACUBITRIL-VALSARTAN 24-26 MG PO TABS: 1.0000 | ORAL_TABLET | Freq: Two times a day (BID) | ORAL | 3 refills | Status: DC

## 2017-04-29 MED ORDER — FUROSEMIDE 40 MG PO TABS: 40.0000 mg | ORAL_TABLET | ORAL | 3 refills | Status: DC | PRN

## 2017-04-29 MED ORDER — POTASSIUM CHLORIDE CRYS ER 10 MEQ PO TBCR: 10.0000 meq | EXTENDED_RELEASE_TABLET | ORAL | 3 refills | Status: DC | PRN

## 2017-04-29 NOTE — Addendum Note (Signed)
Encounter addended by: Darron Doom, RN on: 04/29/2017 10:40 AM<BR>    Actions taken: Medication long-term status modified, Order list changed, Diagnosis association updated, Sign clinical note

## 2017-04-29 NOTE — Progress Notes (Signed)
Advanced Heart Failure Clinic Note   Referring Physician: Dr. Orpah Greek, [facility] Primary Care: Dr. Clementeen Graham Primary Cardiologist: New to Dr. Haroldine Laws.   HPI: Stephen Cardenas is a 61 year old male with a past medical history of chronic systolic CHF (EF 38%) due to ischemic cardiomyopathy with a history of MI. AICD, COPD, OSA on CPAP, HTN, and hypothyroidism.   He has a history of multiple MI's. One in 2007 and one in 2014 that resulted in cardiogenic shock and a prolonged ICU hospitalization.   He was admitted to [facility] in June 2018 with hypoglycemia and discharged to Rehab. He was discharged from [facility] but quickly readmitted to [facility] with chest pain and SOB. He was told that he had a fluid on his lung and then transferred back to rehab and eventually discharged on 02/07/17. He presented to the ED at [facility] on 02/09/17 with SOB and weakness. Chest X ray with large pleural effusion, he was therefore transferred to [facility] where he underwent a left thoracentesis with transudative fluid.   He underwent repeat right and left heart cath on 02/14/17 that showed severe 3 vessel disease with multiple areas of in-stent restenosis. Right heart with Fick output/index 5.9/3.1. He underwent DES placement x 2 to LCx and mid - RCA. Had a 24-hour Thallium viability study with minimal to no viability in all segments except for the septum. Discharge weight was 158 pounds.   He presents today for HF follow up. Feels great. Weight at home up a few pounds at 167. Not overly active but can do all ADLs without problem. Can go 1/2 mile without problem. No orthopnea, PND or edema. No CP.    Past Medical History:  Diagnosis Date  . AICD (automatic cardioverter/defibrillator) present 2014  . CAD (coronary artery disease)    S/p multiple prior MIs/PCIs // amdx in 2014 with acute stent thrombosis c/b CGS // Admx to [facility] in 08/173 2/2 a/c systolic HF >> [facility] 1/0/25: mLAD 95 ISR, dLAD 30, D2 99; OM2 95  ISR; pRCA 30/50, mRCA 90/80, dRCA 30 EF 15 >> min to no viability on Thallium study (not surgical candidate) - PCI>> 3.5 x 20 mm Promus DES to the LCx 3 x 38 mm Promus DES to the proximal mid RCA  . Chronic systolic heart failure (HCC)    2/2 to ischemic CM - Echo 02/12/17:  Mild LVH, EF 15-20, mod to severe MR, severe LAE, mild RVE, mod reduced RVSF, mild RAE, mod TR, PASP 65  . COPD (chronic obstructive pulmonary disease) ([place])   . History of MI (myocardial infarction)   . History of pneumonia   . HTN (hypertension)   . Hyperlipidemia 03/08/2017  . Hypothyroidism   . OSA (obstructive sleep apnea) 03/08/2017    Current Outpatient Prescriptions  Medication Sig Dispense Refill  . ALPRAZolam (XANAX) 0.5 MG tablet Take 0.5 mg by mouth at bedtime as needed for anxiety.    Marland Kitchen aspirin EC 81 MG tablet Take 81 mg by mouth daily.    . carvedilol (COREG) 3.125 MG tablet Take 1 tablet (3.125 mg total) by mouth 2 (two) times daily with a meal. 60 tablet 3  . Cholecalciferol (VITAMIN D3) 2000 units TABS Take 2,000 Units by mouth daily.    . digoxin (LANOXIN) 0.125 MG tablet Take 1 tablet (0.125 mg total) by mouth daily. 30 tablet 3  . DULoxetine (CYMBALTA) 60 MG capsule Take 60 mg by mouth daily.    . ferrous sulfate 325 (  65 FE) MG tablet Take 1 tablet (325 mg total) by mouth 2 (two) times daily with a meal. 60 tablet 3  . furosemide (LASIX) 40 MG tablet Take 1 tablet (40 mg total) by mouth daily. 30 tablet 3  . ipratropium-albuterol (DUONEB) 0.5-2.5 (3) MG/3ML SOLN Take 3 mLs by nebulization every 6 (six) hours as needed (for shortness of breath). 360 mL 0  . levothyroxine (SYNTHROID, LEVOTHROID) 150 MCG tablet Take 150 mcg by mouth daily before breakfast.    . losartan (COZAAR) 25 MG tablet Take 1 tablet (25 mg total) by mouth 2 (two) times daily. 60 tablet 3  . Multiple Vitamins-Minerals (MULTIVITAMIN PO) Take 1 tablet by mouth daily.    . pantoprazole (PROTONIX) 40 MG tablet Take 1 tablet (40 mg total)  by mouth at bedtime. 30 tablet 0  . potassium chloride (K-DUR,KLOR-CON) 10 MEQ tablet Take 1 tablet (10 mEq total) by mouth daily. 30 tablet 3  . rosuvastatin (CRESTOR) 20 MG tablet Take 1 tablet (20 mg total) by mouth daily at 6 PM. 30 tablet 3  . spironolactone (ALDACTONE) 25 MG tablet Take 1 tablet (25 mg total) by mouth daily. 30 tablet 3  . ticagrelor (BRILINTA) 90 MG TABS tablet Take 1 tablet (90 mg total) by mouth 2 (two) times daily. 60 tablet 3  . vitamin B-12 (CYANOCOBALAMIN) 1000 MCG tablet Take 2,000 mcg by mouth daily.     No current facility-administered medications for this encounter.     Allergies  Allergen Reactions  . Codeine Other (See Comments) and Nausea Only    Increased work of breathing, diaphoretic  . Mirtazapine     Other reaction(s): Other - See Comments  . Other     Other reaction(s): Other - See Comments      Social History   Social History  . Marital status: Widowed    Spouse name: N/A  . Number of children: N/A  . Years of education: N/A   Occupational History  . Not on file.   Social History Main Topics  . Smoking status: Former Smoker    Types: Cigarettes    Start date: 1968    Quit date: 1996  . Smokeless tobacco: Never Used  . Alcohol use No  . Drug use: No  . Sexual activity: Not on file   Other Topics Concern  . Not on file   Social History Narrative   Lives in [place]   Attends [employer]   Disabled carpenter      Family History  Problem Relation Age of Onset  . Diabetes Mother   . Heart disease Mother   . Stroke Mother   . Heart attack Mother   . Heart disease Father   . Heart attack Father   . Stroke Father   . COPD Sister   . Congestive Heart Failure Sister   . Hypertension Sister   . Diabetes Sister   . Hypertension Brother   . Diabetes Sister   . Hypertension Sister   . Diabetes Sister   . Hypertension Sister     Vitals:   04/29/17 0934  BP: 126/84  Pulse: 77  SpO2: 96%  Weight: 173 lb  12.8 oz (78.8 kg)     PHYSICAL EXAM: General:  Well appearing. No resp difficulty HEENT: normal Neck: supple. no JVD. Carotids 2+ bilat; no bruits. No lymphadenopathy or thryomegaly appreciated. Cor: PMI nondisplaced. Regular rate & rhythm. No rubs, gallops or murmurs. Lungs: clear with decreased BS  throughout  Abdomen: soft, nontender, nondistended. No hepatosplenomegaly. No bruits or masses. Good bowel sounds. Extremities: no cyanosis, clubbing, rash, edema Neuro: alert & orientedx3, cranial nerves grossly intact. moves all 4 extremities w/o difficulty. Affect pleasant   ASSESSMENT & PLAN:  1.Chronic systolic CHF: ICM, EF 36-14% June 2018 s/p MDT ICD - Stable NYHA II - Volume status stable on exam despite weight gain - Switch losartan to Entresto 24/26 (previously got dizzy with 49/51 but will stop lasix as well) - Stop lasix and kcl take - only as needed - Continue Spiro 25 mg daily - Continue digoxin 0.125 mg, will get dig level today.  - Continue Coreg 3.125 mg BID.  - labs today  2. CAD s/p previous MI - Cath 7/1 with severe 3 v CAD with multiple areas of ISR - s/p 2v PCI 02/22/17 by Dr. Burt Knack.  - Anterior wall is non-viable given previous LAD stent thrombosis. 24-hour Thallium viability study showed minimal or no viability in all segments except for septum - Continue ASA, Brilinta and Crestor.  - Refer to CR in [place]  3.  Chronic hypoxic respiratory failure with large left pleural effusion - S/p thoracentesis in 6/18 - F/u C/X on 7/5 with small to moderate residual effusion. - Stable.    4. COPD - Stable.  - Follows with PCP, no wheeze.   5. Mediastinal lymphadenopathy on chest CT - Repeat chest CT. If still present will need PET scan   Glori Bickers, MD 04/29/17

## 2017-04-29 NOTE — Patient Instructions (Signed)
Labs today (will call for abnormal results, otherwise no news is good news)  STOP taking Losartan  STOP taking Lasix and potassium.  You may take Lasix as needed for weight gain or fluid.  Take a 10 mEq of potassium along with lasix.  START taking Entresto 24-26 mg (1 Tablet) Twice daily  CT of Chest has been ordered for you.  Cardiac Rehab has been ordered for you, they will contact you to schedule initial appointment.  Please have Dr. Ammie Ferrier office draw BMET in 2 weeks.  Follow up in 3 Months

## 2017-05-06 ENCOUNTER — Ambulatory Visit (HOSPITAL_COMMUNITY)
Admission: RE | Admit: 2017-05-06 | Discharge: 2017-05-06 | Disposition: A | Discharge status: Home / Self Care | Payer: Medicare Other | Source: Ambulatory Visit | Attending: Internal Medicine | Admitting: Internal Medicine

## 2017-05-06 DIAGNOSIS — R599 Enlarged lymph nodes, unspecified: Secondary | ICD-10-CM | POA: Insufficient documentation

## 2017-05-06 DIAGNOSIS — M4856XA Collapsed vertebra, not elsewhere classified, lumbar region, initial encounter for fracture: Secondary | ICD-10-CM | POA: Diagnosis not present

## 2017-05-06 DIAGNOSIS — J439 Emphysema, unspecified: Secondary | ICD-10-CM | POA: Diagnosis not present

## 2017-05-06 DIAGNOSIS — I700 Atherosclerosis of aorta: Secondary | ICD-10-CM | POA: Diagnosis not present

## 2017-05-06 DIAGNOSIS — J90 Pleural effusion, not elsewhere classified: Secondary | ICD-10-CM | POA: Insufficient documentation

## 2017-05-06 DIAGNOSIS — R590 Localized enlarged lymph nodes: Secondary | ICD-10-CM

## 2017-05-12 ENCOUNTER — Telehealth (HOSPITAL_COMMUNITY): Payer: Self-pay | Admitting: *Deleted

## 2017-05-12 DIAGNOSIS — R911 Solitary pulmonary nodule: Secondary | ICD-10-CM

## 2017-05-12 NOTE — Telephone Encounter (Signed)
Notes recorded by Jolaine Artist, MD on 05/11/2017 at 6:39 PM EDT Refer to Dr. Lamonte Sakai for evaluation of lung nodule.  Pt aware and agreeable, referral placed

## 2017-05-18 DIAGNOSIS — Z0189 Encounter for other specified special examinations: Secondary | ICD-10-CM | POA: Diagnosis not present

## 2017-05-20 ENCOUNTER — Telehealth: Payer: Self-pay

## 2017-05-20 NOTE — Telephone Encounter (Signed)
Spoke with patient and confirmed address. Informed patient that I would order him a new monitor. I gave patient the device clinic phone number to call for assistance once he receives it. Patient verbalized understanding.

## 2017-05-20 NOTE — Telephone Encounter (Signed)
Patient referred to [facility] clinic by Dr Rayann Heman.  Call to patient and provided ICM intro. He agreed to [facility] monthly follow up.  Patient said he does not have a working monitor and he thought one would be ordered when he had the office visit with Dr Rayann Heman.  Advised I would check on the home monitor and call him back.  Provided him with ICM direct number.

## 2017-05-23 ENCOUNTER — Ambulatory Visit (INDEPENDENT_AMBULATORY_CARE_PROVIDER_SITE_OTHER): Payer: Medicare Other | Admitting: Emergency Medicine

## 2017-05-23 ENCOUNTER — Encounter: Payer: Self-pay | Admitting: Emergency Medicine

## 2017-05-23 DIAGNOSIS — R911 Solitary pulmonary nodule: Secondary | ICD-10-CM | POA: Diagnosis not present

## 2017-05-23 DIAGNOSIS — Z23 Encounter for immunization: Secondary | ICD-10-CM | POA: Diagnosis not present

## 2017-05-23 DIAGNOSIS — G4733 Obstructive sleep apnea (adult) (pediatric): Secondary | ICD-10-CM | POA: Diagnosis not present

## 2017-05-23 DIAGNOSIS — J449 Chronic obstructive pulmonary disease, unspecified: Secondary | ICD-10-CM | POA: Insufficient documentation

## 2017-05-23 DIAGNOSIS — I255 Ischemic cardiomyopathy: Secondary | ICD-10-CM

## 2017-05-23 NOTE — Assessment & Plan Note (Signed)
Difficult to quantify his degree of obstruction based on his pulmonary function testing from 02/2017. Most notable observation on that study was restriction. There may be some benefit to repeating his pulmonary function testing since that study was done while he was acutely ill and admitted to the hospital. Continue DuoNeb as needed for now.

## 2017-05-23 NOTE — Assessment & Plan Note (Signed)
Left lower lobe pulmonary nodule on CT scan from 05/06/17. I suspect that the rounded area is scar/sequela from his recent hospitalization in June/July it reached he was treated for volume overload and also left-sided pneumonia. It is possible that the nodule was hidden in his area of left lower lobe consolidation on his initial films from June. Thoracentesis at that time did not show any evidence of malignancy. The associated lymphadenopathy has decreased in size. I believe he needs a PET scan now to further risk stratify. If the PET scan is hypermetabolic and I would push for biopsy now, either transthoracic needle biopsy or bronchoscopically. If the PET scan is negative then we can follow serial scans for interval change.

## 2017-05-23 NOTE — Patient Instructions (Signed)
Please continue your current medications as you have been taking them  We will perform a PET scan to evaluate your left lower lobe nodule. Depending on the results we will decide how to follow the nodule going forward.  Follow with Dr Lamonte Sakai next available after your PET scan to discuss the results.

## 2017-05-23 NOTE — Progress Notes (Signed)
Subjective:    Patient ID: Stephen Cardenas, male    DOB: 1956-06-18, 61 y.o.   MRN: [id number]  HPI 61 year old former smoker with coronary artery disease, ischemic cardiomyopathy with an AICD, hypothyroidism, hyperlipidemia. Also cares a history of COPD, has duoneb available for prn use. He underwent pulmonary function testing 02/18/17 that I personally reviewed. Spirometry was most consistent with restriction response. Lung volumes were restricted. Diffusion capacity decreased and did not correct when adjusted for alveolar volume. Difficult to quantify his degree of obstruction based on that study.  He was admitted in the hospital late June into July for volume overload, possible left-sided pneumonia with associated left effusion and mediastinal, paratracheal lymphadenopathy. A left thoracentesis showed mesothelial cells without evidence of malignancy. He was treated with antibiotics for possible pneumonia. He also underwent PTCI and treatment of severe in-stent restenosis of his left circumflex and RCA. Since hospitalization he feels well. He rarely needs DuoNeb. May have gained a little weight but no edema.   He underwent a follow-up CT scan of the chest 05/06/17 to follow mediastinal lymphadenopathy and left-sided infiltrate. I have reviewed the scan, it shows resolution of his left greater than right alveolar infiltrates, resolution of his left lower lobe collapse, significant improvement in his left effusion with only a small amount of residual fluid. There is now identified a 2.0 x 1.7 cm rounded area at the posterior left lower lobe with a possible area of cavitation versus air bronchogram. The mediastinal and right paratracheal lymphadenopathy was noted on his June CT scan are both decreased in size.  Review of Systems  Constitutional: Positive for unexpected weight change. Negative for fever.  HENT: Positive for ear pain. Negative for congestion, dental problem, nosebleeds, postnasal drip,  rhinorrhea, sinus pressure, sneezing, sore throat and trouble swallowing.   Eyes: Negative for redness and itching.  Respiratory: Positive for cough, chest tightness and shortness of breath. Negative for wheezing.   Cardiovascular: Negative for palpitations and leg swelling.  Gastrointestinal: Negative for nausea and vomiting.  Genitourinary: Negative for dysuria.  Musculoskeletal: Negative for joint swelling.  Skin: Negative for rash.  Allergic/Immunologic: Negative.  Negative for environmental allergies, food allergies and immunocompromised state.  Neurological: Positive for headaches.  Hematological: Bruises/bleeds easily.  Psychiatric/Behavioral: Positive for dysphoric mood. The patient is nervous/anxious.     Past Medical History:  Diagnosis Date  . AICD (automatic cardioverter/defibrillator) present 2014  . CAD (coronary artery disease)    S/p multiple prior MIs/PCIs // amdx in 2014 with acute stent thrombosis c/b CGS // Admx to [facility] in 0/9983 2/2 a/c systolic HF >> [facility] 10/22/23: mLAD 95 ISR, dLAD 30, D2 99; OM2 95 ISR; pRCA 30/50, mRCA 90/80, dRCA 30 EF 15 >> min to no viability on Thallium study (not surgical candidate) - PCI>> 3.5 x 20 mm Promus DES to the LCx 3 x 38 mm Promus DES to the proximal mid RCA  . Chronic systolic heart failure (HCC)    2/2 to ischemic CM - Echo 02/12/17:  Mild LVH, EF 15-20, mod to severe MR, severe LAE, mild RVE, mod reduced RVSF, mild RAE, mod TR, PASP 65  . COPD (chronic obstructive pulmonary disease) ([place])   . History of MI (myocardial infarction)   . History of pneumonia   . HTN (hypertension)   . Hyperlipidemia 03/08/2017  . Hypothyroidism   . OSA (obstructive sleep apnea) 03/08/2017     Family History  Problem Relation Age of Onset  . Diabetes Mother   . Heart  disease Mother   . Stroke Mother   . Heart attack Mother   . Heart disease Father   . Heart attack Father   . Stroke Father   . COPD Sister   . Congestive Heart Failure Sister   .  Hypertension Sister   . Diabetes Sister   . Hypertension Brother   . Diabetes Sister   . Hypertension Sister   . Diabetes Sister   . Hypertension Sister      Social History   Social History  . Marital status: Widowed    Spouse name: N/A  . Number of children: N/A  . Years of education: N/A   Occupational History  . Not on file.   Social History Main Topics  . Smoking status: Former Smoker    Packs/day: 1.00    Years: 28.00    Types: Cigarettes    Start date: 1968    Quit date: 1996  . Smokeless tobacco: Never Used  . Alcohol use No  . Drug use: No  . Sexual activity: Not on file   Other Topics Concern  . Not on file   Social History Narrative   Lives in [place]   Attends [employer]   Disabled carpenter     Allergies  Allergen Reactions  . Codeine Other (See Comments) and Nausea Only    Increased work of breathing, diaphoretic  . Mirtazapine     Other reaction(s): Other - See Comments  . Other     Other reaction(s): Other - See Comments     Outpatient Medications Prior to Visit  Medication Sig Dispense Refill  . ALPRAZolam (XANAX) 0.5 MG tablet Take 0.5 mg by mouth at bedtime as needed for anxiety.    Marland Kitchen aspirin EC 81 MG tablet Take 81 mg by mouth daily.    . carvedilol (COREG) 3.125 MG tablet Take 1 tablet (3.125 mg total) by mouth 2 (two) times daily with a meal. 60 tablet 3  . Cholecalciferol (VITAMIN D3) 2000 units TABS Take 2,000 Units by mouth daily.    . digoxin (LANOXIN) 0.125 MG tablet Take 1 tablet (0.125 mg total) by mouth daily. 30 tablet 3  . DULoxetine (CYMBALTA) 60 MG capsule Take 60 mg by mouth daily.    . ferrous sulfate 325 (65 FE) MG tablet Take 1 tablet (325 mg total) by mouth 2 (two) times daily with a meal. 60 tablet 3  . ipratropium-albuterol (DUONEB) 0.5-2.5 (3) MG/3ML SOLN Take 3 mLs by nebulization every 6 (six) hours as needed (for shortness of breath). 360 mL 0  . levothyroxine (SYNTHROID, LEVOTHROID) 150 MCG tablet  Take 150 mcg by mouth daily before breakfast.    . Multiple Vitamins-Minerals (MULTIVITAMIN PO) Take 1 tablet by mouth daily.    . pantoprazole (PROTONIX) 40 MG tablet Take 1 tablet (40 mg total) by mouth at bedtime. 30 tablet 3  . sacubitril-valsartan (ENTRESTO) 24-26 MG Take 1 tablet by mouth 2 (two) times daily. 60 tablet 3  . spironolactone (ALDACTONE) 25 MG tablet Take 1 tablet (25 mg total) by mouth daily. 30 tablet 3  . ticagrelor (BRILINTA) 90 MG TABS tablet Take 1 tablet (90 mg total) by mouth 2 (two) times daily. 60 tablet 3  . vitamin B-12 (CYANOCOBALAMIN) 1000 MCG tablet Take 2,000 mcg by mouth daily.    . furosemide (LASIX) 40 MG tablet Take 1 tablet (40 mg total) by mouth as needed for fluid or edema (Weight gain). (Patient not taking: Reported on  05/23/2017) 15 tablet 3  . potassium chloride (K-DUR,KLOR-CON) 10 MEQ tablet Take 1 tablet (10 mEq total) by mouth as needed (take with lasix only). (Patient not taking: Reported on 05/23/2017) 15 tablet 3  . rosuvastatin (CRESTOR) 20 MG tablet Take 1 tablet (20 mg total) by mouth daily at 6 PM. 30 tablet 3   No facility-administered medications prior to visit.          Objective:   Physical Exam Vitals:   05/23/17 1152  BP: 120/70  Pulse: 78  SpO2: 99%  Weight: 181 lb 3.2 oz (82.2 kg)  Height: 5\' 10"  (1.778 m)   Gen: Pleasant, well-nourished, in no distress,  normal affect  ENT: No lesions,  mouth clear,  oropharynx clear, no postnasal drip  Neck: No JVD, no Stridor  Lungs: No use of accessory muscles, Clear bilaterally, no wheezing  Cardiovascular: RRR, distant, no murmur  Musculoskeletal: No deformities, no cyanosis or clubbing  Neuro: alert, non focal  Skin: Warm, no lesions or rashes      Assessment & Plan:  Pulmonary nodule, left Left lower lobe pulmonary nodule on CT scan from 05/06/17. I suspect that the rounded area is scar/sequela from his recent hospitalization in June/July it reached he was treated for  volume overload and also left-sided pneumonia. It is possible that the nodule was hidden in his area of left lower lobe consolidation on his initial films from June. Thoracentesis at that time did not show any evidence of malignancy. The associated lymphadenopathy has decreased in size. I believe he needs a PET scan now to further risk stratify. If the PET scan is hypermetabolic and I would push for biopsy now, either transthoracic needle biopsy or bronchoscopically. If the PET scan is negative then we can follow serial scans for interval change.  COPD (chronic obstructive pulmonary disease) ([place]) Difficult to quantify his degree of obstruction based on his pulmonary function testing from 02/2017. Most notable observation on that study was restriction. There may be some benefit to repeating his pulmonary function testing since that study was done while he was acutely ill and admitted to the hospital. Continue DuoNeb as needed for now.  OSA (obstructive sleep apnea) He tells me that he doesn't know how this diagnosis was made, does not recall ever having a sleep study.  Baltazar Apo, MD, PhD 05/23/2017, 12:38 PM [facility] [phone] or if no answer [phone]

## 2017-05-23 NOTE — Assessment & Plan Note (Signed)
He tells me that he doesn't know how this diagnosis was made, does not recall ever having a sleep study.

## 2017-05-27 ENCOUNTER — Other Ambulatory Visit (HOSPITAL_COMMUNITY): Payer: Self-pay | Admitting: Internal Medicine
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
## 2017-06-02 ENCOUNTER — Ambulatory Visit (HOSPITAL_COMMUNITY)
Admission: RE | Admit: 2017-06-02 | Discharge: 2017-06-02 | Disposition: A | Discharge status: Home / Self Care | Payer: Medicare Other | Source: Ambulatory Visit | Attending: Emergency Medicine | Admitting: Emergency Medicine

## 2017-06-02 DIAGNOSIS — I2510 Atherosclerotic heart disease of native coronary artery without angina pectoris: Secondary | ICD-10-CM | POA: Insufficient documentation

## 2017-06-02 DIAGNOSIS — R918 Other nonspecific abnormal finding of lung field: Secondary | ICD-10-CM | POA: Insufficient documentation

## 2017-06-02 DIAGNOSIS — J439 Emphysema, unspecified: Secondary | ICD-10-CM | POA: Diagnosis not present

## 2017-06-02 DIAGNOSIS — R911 Solitary pulmonary nodule: Secondary | ICD-10-CM

## 2017-06-02 DIAGNOSIS — I517 Cardiomegaly: Secondary | ICD-10-CM | POA: Diagnosis not present

## 2017-06-02 DIAGNOSIS — K5730 Diverticulosis of large intestine without perforation or abscess without bleeding: Secondary | ICD-10-CM | POA: Diagnosis not present

## 2017-06-02 DIAGNOSIS — J90 Pleural effusion, not elsewhere classified: Secondary | ICD-10-CM | POA: Diagnosis not present

## 2017-06-02 DIAGNOSIS — I700 Atherosclerosis of aorta: Secondary | ICD-10-CM | POA: Insufficient documentation

## 2017-06-02 DIAGNOSIS — I77811 Abdominal aortic ectasia: Secondary | ICD-10-CM | POA: Insufficient documentation

## 2017-06-02 LAB — GLUCOSE, CAPILLARY: GLUCOSE-CAPILLARY: 122 mg/dL — AB (ref 65–99)

## 2017-06-02 MED ORDER — FLUDEOXYGLUCOSE F - 18 (FDG) INJECTION
9.1000 | Freq: Once | INTRAVENOUS | Status: AC | PRN
  Administered 2017-06-02: 9.1 via INTRAVENOUS

## 2017-06-06 ENCOUNTER — Ambulatory Visit (INDEPENDENT_AMBULATORY_CARE_PROVIDER_SITE_OTHER): Payer: Medicare Other

## 2017-06-06 DIAGNOSIS — I5022 Chronic systolic (congestive) heart failure: Secondary | ICD-10-CM | POA: Diagnosis not present

## 2017-06-06 DIAGNOSIS — Z95810 Presence of automatic (implantable) cardiac defibrillator: Secondary | ICD-10-CM

## 2017-06-06 NOTE — Progress Notes (Signed)
EPIC Encounter for ICM Monitoring  Patient Name: Stephen Cardenas is a 61 y.o. male Date: 06/06/2017 Primary Care Physican: System, Pcp Not In Primary Cardiologist: [place] Electrophysiologist: Allred Dry Weight: 172 lbs weighs daily  Clinical Status (22-Apr-2017 to 06-Jun-2017) Treated VT/VF 0 episodes  AT/AF 1 episode  Time in AT/AF 0.5 hr/day (2.1%)      1st ICM Remote.  Heart Failure questions reviewed, pt symptomatic with weight gain during decreased impedance.   Thoracic impedance normal but was abnormal suggesting fluid accumulation from 05/02/2017 to 05/29/2017 with exception of a few days at baseline.  Prescribed dosage: Furosemide 40 mg 1 tablet as needed for weight gain, fluid or edema.  Patient takes 3 to 4 days of PRN Furosemide when he has weight gain.   Recommendations: No changes.  Discuss low salt foods and he said he uses Mrs Deliah Boston, rinses any canned foods and does not eat out a lot. He says he limits salt intake.  Encouraged to call for fluid symptoms.  Follow-up plan: ICM clinic phone appointment on 07/11/2017.    Copy of ICM check sent to Dr. Rayann Heman.   3 month ICM trend: 06/06/2017   1 Year ICM trend:      [name], RN 06/06/2017 5:24 PM

## 2017-06-09 ENCOUNTER — Encounter: Payer: Self-pay | Admitting: Emergency Medicine

## 2017-06-09 ENCOUNTER — Ambulatory Visit (INDEPENDENT_AMBULATORY_CARE_PROVIDER_SITE_OTHER): Payer: Medicare Other | Admitting: Emergency Medicine

## 2017-06-09 DIAGNOSIS — I255 Ischemic cardiomyopathy: Secondary | ICD-10-CM

## 2017-06-09 DIAGNOSIS — J449 Chronic obstructive pulmonary disease, unspecified: Secondary | ICD-10-CM

## 2017-06-09 DIAGNOSIS — R911 Solitary pulmonary nodule: Secondary | ICD-10-CM | POA: Diagnosis not present

## 2017-06-09 NOTE — Progress Notes (Signed)
Subjective:    Patient ID: Stephen Cardenas, male    DOB: [birth year], 61 y.o.   MRN: [id number]  HPI 61 year old former smoker with coronary artery disease, ischemic cardiomyopathy with an AICD, hypothyroidism, hyperlipidemia. Also cares a history of COPD, has duoneb available for prn use. He underwent pulmonary function testing 02/18/17 that I personally reviewed. Spirometry was most consistent with restriction response. Lung volumes were restricted. Diffusion capacity decreased and did not correct when adjusted for alveolar volume. Difficult to quantify his degree of obstruction based on that study.  He was admitted in the hospital late June into July for volume overload, possible left-sided pneumonia with associated left effusion and mediastinal, paratracheal lymphadenopathy. A left thoracentesis showed mesothelial cells without evidence of malignancy. He was treated with antibiotics for possible pneumonia. He also underwent PTCI and treatment of severe in-stent restenosis of his left circumflex and RCA. Since hospitalization he feels well. He rarely needs DuoNeb. May have gained a little weight but no edema.   He underwent a follow-up CT scan of the chest 05/06/17 to follow mediastinal lymphadenopathy and left-sided infiltrate. I have reviewed the scan, it shows resolution of his left greater than right alveolar infiltrates, resolution of his left lower lobe collapse, significant improvement in his left effusion with only a small amount of residual fluid. There is now identified a 2.0 x 1.7 cm rounded area at the posterior left lower lobe with a possible area of cavitation versus air bronchogram. The mediastinal and right paratracheal lymphadenopathy was noted on his June CT scan are both decreased in size.  ROV 06/09/17 --patient has a history of former tobacco, coronary disease with an ischemic cardiomyopathy and AICD, COPD.  As above he was admitted for a left-sided pneumonia and intervention for his  coronary disease in July 2018.  A follow-up CT scan 05/06/17 showed some aches significant improvement but a 2.0 x 1.7 cm left lower lobe rounded opacity.  We performed a PET scan to better characterize this area, done on 06/02/17 and I have reviewed.  This shows no evidence for hypermetabolic activity in the left lower lobe nodular opacity that abuts the pleura.  The lesion has become smaller compared with 9/21, now measures 1.6 cm.     Review of Systems  Constitutional: Positive for unexpected weight change. Negative for fever.  HENT: Positive for ear pain. Negative for congestion, dental problem, nosebleeds, postnasal drip, rhinorrhea, sinus pressure, sneezing, sore throat and trouble swallowing.   Eyes: Negative for redness and itching.  Respiratory: Positive for cough, chest tightness and shortness of breath. Negative for wheezing.   Cardiovascular: Negative for palpitations and leg swelling.  Gastrointestinal: Negative for nausea and vomiting.  Genitourinary: Negative for dysuria.  Musculoskeletal: Negative for joint swelling.  Skin: Negative for rash.  Allergic/Immunologic: Negative.  Negative for environmental allergies, food allergies and immunocompromised state.  Neurological: Positive for headaches.  Hematological: Bruises/bleeds easily.  Psychiatric/Behavioral: Positive for dysphoric mood. The patient is nervous/anxious.     Past Medical History:  Diagnosis Date  . AICD (automatic cardioverter/defibrillator) present 2014  . CAD (coronary artery disease)    S/p multiple prior MIs/PCIs // amdx in 2014 with acute stent thrombosis c/b CGS // Admx to [facility] in 12/8848 2/2 a/c systolic HF >> [facility] 09/22/72: mLAD 95 ISR, dLAD 30, D2 99; OM2 95 ISR; pRCA 30/50, mRCA 90/80, dRCA 30 EF 15 >> min to no viability on Thallium study (not surgical candidate) - PCI>> 3.5 x 20 mm Promus DES to the LCx  3 x 38 mm Promus DES to the proximal mid RCA  . Chronic systolic heart failure (HCC)    2/2 to ischemic  CM - Echo 02/12/17:  Mild LVH, EF 15-20, mod to severe MR, severe LAE, mild RVE, mod reduced RVSF, mild RAE, mod TR, PASP 65  . COPD (chronic obstructive pulmonary disease) ([place])   . History of MI (myocardial infarction)   . History of pneumonia   . HTN (hypertension)   . Hyperlipidemia 03/08/2017  . Hypothyroidism   . OSA (obstructive sleep apnea) 03/08/2017     Family History  Problem Relation Age of Onset  . Diabetes Mother   . Heart disease Mother   . Stroke Mother   . Heart attack Mother   . Heart disease Father   . Heart attack Father   . Stroke Father   . COPD Sister   . Congestive Heart Failure Sister   . Hypertension Sister   . Diabetes Sister   . Hypertension Brother   . Diabetes Sister   . Hypertension Sister   . Diabetes Sister   . Hypertension Sister      Social History   Social History  . Marital status: Widowed    Spouse name: N/A  . Number of children: N/A  . Years of education: N/A   Occupational History  . Not on file.   Social History Main Topics  . Smoking status: Former Smoker    Packs/day: 1.00    Years: 28.00    Types: Cigarettes    Start date: 1968    Quit date: 1996  . Smokeless tobacco: Never Used  . Alcohol use No  . Drug use: No  . Sexual activity: Not on file   Other Topics Concern  . Not on file   Social History Narrative   Lives in [place]   Attends [employer]   Disabled carpenter     Allergies  Allergen Reactions  . Codeine Other (See Comments) and Nausea Only    Increased work of breathing, diaphoretic  . Mirtazapine     Other reaction(s): Other - See Comments  . Other     Other reaction(s): Other - See Comments     Outpatient Medications Prior to Visit  Medication Sig Dispense Refill  . ALPRAZolam (XANAX) 0.5 MG tablet Take 0.5 mg by mouth at bedtime as needed for anxiety.    Marland Kitchen aspirin EC 81 MG tablet Take 81 mg by mouth daily.    Marland Kitchen atorvastatin (LIPITOR) 20 MG tablet Take 20 mg by mouth at  bedtime.    . carvedilol (COREG) 3.125 MG tablet Take 1 tablet (3.125 mg total) by mouth 2 (two) times daily with a meal. 60 tablet 3  . Cholecalciferol (VITAMIN D3) 2000 units TABS Take 2,000 Units by mouth daily.    . digoxin (LANOXIN) 0.125 MG tablet Take 1 tablet (0.125 mg total) by mouth daily. 30 tablet 3  . DULoxetine (CYMBALTA) 60 MG capsule Take 60 mg by mouth daily.    . ferrous sulfate 325 (65 FE) MG tablet Take 1 tablet (325 mg total) by mouth 2 (two) times daily with a meal. 60 tablet 3  . furosemide (LASIX) 40 MG tablet Take 1 tablet (40 mg total) by mouth as needed for fluid or edema (Weight gain). 15 tablet 3  . ipratropium-albuterol (DUONEB) 0.5-2.5 (3) MG/3ML SOLN Take 3 mLs by nebulization every 6 (six) hours as needed (for shortness of breath). 360 mL 0  .  levothyroxine (SYNTHROID, LEVOTHROID) 150 MCG tablet Take 150 mcg by mouth daily before breakfast.    . Multiple Vitamins-Minerals (MULTIVITAMIN PO) Take 1 tablet by mouth daily.    . pantoprazole (PROTONIX) 40 MG tablet Take 1 tablet (40 mg total) by mouth at bedtime. 30 tablet 3  . potassium chloride (K-DUR,KLOR-CON) 10 MEQ tablet Take 1 tablet (10 mEq total) by mouth as needed (take with lasix only). 15 tablet 3  . sacubitril-valsartan (ENTRESTO) 24-26 MG Take 1 tablet by mouth 2 (two) times daily. 60 tablet 3  . spironolactone (ALDACTONE) 25 MG tablet Take 1 tablet (25 mg total) by mouth daily. 30 tablet 3  . ticagrelor (BRILINTA) 90 MG TABS tablet Take 1 tablet (90 mg total) by mouth 2 (two) times daily. 60 tablet 3  . vitamin B-12 (CYANOCOBALAMIN) 1000 MCG tablet Take 2,000 mcg by mouth daily.     No facility-administered medications prior to visit.          Objective:   Physical Exam Vitals:   06/09/17 1032  BP: 100/78  Pulse: 64  SpO2: 99%  Weight: 183 lb 3.2 oz (83.1 kg)  Height: 5\' 10"  (1.778 m)   Gen: Pleasant, well-nourished, in no distress,  normal affect  ENT: No lesions,  mouth clear,   oropharynx clear, no postnasal drip  Neck: No JVD, no Stridor  Lungs: No use of accessory muscles, Clear bilaterally, no wheezing  Cardiovascular: RRR, distant, no murmur  Musculoskeletal: No deformities, no cyanosis or clubbing  Neuro: alert, non focal  Skin: Warm, no lesions or rashes      Assessment & Plan:  COPD (chronic obstructive pulmonary disease) ([place]) He has DuoNeb available, rarely requires. No functional limitations.   Pulmonary nodule, left Nodule is not hypermetabolic on PET scan, has shrunk in the 1 month interval.  This is most consistent with rounded atelectasis following a significant left lower lobe pneumonia this past summer.  I believe we do need to follow with serial CT scans just in case there were to be an interval change.  We will repeat his CT scan without contrast in October 2019  Baltazar Apo, MD, PhD 06/09/2017, 11:03 AM [facility] [phone] or if no answer [phone]

## 2017-06-09 NOTE — Assessment & Plan Note (Signed)
He has DuoNeb available, rarely requires. No functional limitations.

## 2017-06-09 NOTE — Assessment & Plan Note (Signed)
Nodule is not hypermetabolic on PET scan, has shrunk in the 1 month interval.  This is most consistent with rounded atelectasis following a significant left lower lobe pneumonia this past summer.  I believe we do need to follow with serial CT scans just in case there were to be an interval change.  We will repeat his CT scan without contrast in October 2019

## 2017-06-09 NOTE — Patient Instructions (Signed)
Please keep DuoNeb available to use as needed for shortness of breath, wheezing, coughing. Flu shot up-to-date We will repeat your CT scan of the chest without contrast to follow your left lower lobe pulmonary nodule in October 2019. Follow with Dr Lamonte Sakai in October 2019 to review your scan results, or sooner if you have any problems

## 2017-06-16 ENCOUNTER — Encounter (HOSPITAL_COMMUNITY): Payer: Medicare Other | Admitting: Internal Medicine

## 2017-07-11 ENCOUNTER — Ambulatory Visit (INDEPENDENT_AMBULATORY_CARE_PROVIDER_SITE_OTHER): Payer: Medicare Other

## 2017-07-11 DIAGNOSIS — Z95810 Presence of automatic (implantable) cardiac defibrillator: Secondary | ICD-10-CM | POA: Diagnosis not present

## 2017-07-11 DIAGNOSIS — I5022 Chronic systolic (congestive) heart failure: Secondary | ICD-10-CM | POA: Diagnosis not present

## 2017-07-12 NOTE — Progress Notes (Signed)
EPIC Encounter for ICM Monitoring  Patient Name: Stephen Cardenas is a 61 y.o. male Date: 07/12/2017 Primary Care Physican: System, Pcp Not In Primary Cardiologist: [place] Electrophysiologist: Allred Dry Weight: 174 lbs weighs daily      Heart Failure questions reviewed.  Patient reported gaining as much as 8 lbs during time of decreased impedance.  Patient has been stopping Entresto when he gains fluid weight and starts PRN Furosemide.  Once the fluid symptoms resolve, he stops the Furosemide and resumes Entresto.  He said Dr Haroldine Laws told him to take it that way.  He is also running out of Entresto and has not filled out patient assistance form.    Thoracic impedance normal today but was abnormal suggesting fluid accumulation from 11/7 to 11/26.    Prescribed dosage: Furosemide 40 mg 1 tablet as needed for weight gain, fluid or edema.  Patient takes 3 to 4 days of PRN Furosemide when he has weight gain.   Labs: 05/18/2017 Creatinine 1.16, BUN 11, Potassium 4.4, Sodium 142, EGFR 67-77 04/29/2017 Creatinine 1.00, BUN 13, Potassium 3.8, Sodium 132, EGFR >60  03/17/2017 Creatinine 0.87, BUN 16, Potassium 3.4, Sodium 137, EGFR >60 03/08/2017 Creatinine 0.78, BUN 15, Potassium 4.0, Sodium 138, EGFR 97-112  02/23/2017 Creatinine 0.80, BUN 13, Potassium 4.6, Sodium 134, EGFR >60  Due to a large number of results some have not been displayed. A complete set of results can be found in Results Review.  Recommendations:  Advised there may have been miscommunication about how to take Entresto & Furosemide and to call Dr Bensimhon's office tomorrow to ask for assistance with [facility] and to clarify how he should be taking those meds.     Follow-up plan: ICM clinic phone appointment on 08/11/2017.  Office appointment scheduled 08/01/2017 with Dr. Haroldine Laws.  Copy of ICM check sent to Dr. Rayann Heman and Dr Haroldine Laws..   3 month ICM trend: 07/11/2017    1 Year ICM trend:       [name],  RN 07/12/2017 8:08 AM

## 2017-08-01 ENCOUNTER — Ambulatory Visit (HOSPITAL_COMMUNITY)
Admission: RE | Admit: 2017-08-01 | Discharge: 2017-08-01 | Disposition: A | Discharge status: Home / Self Care | Payer: Medicare Other | Source: Ambulatory Visit | Attending: Internal Medicine | Admitting: Internal Medicine

## 2017-08-01 ENCOUNTER — Encounter (HOSPITAL_COMMUNITY): Payer: Self-pay | Admitting: Internal Medicine

## 2017-08-01 VITALS — BP 112/84 | HR 83 | Wt 188.1 lb

## 2017-08-01 DIAGNOSIS — Z95810 Presence of automatic (implantable) cardiac defibrillator: Secondary | ICD-10-CM | POA: Diagnosis not present

## 2017-08-01 DIAGNOSIS — I5022 Chronic systolic (congestive) heart failure: Secondary | ICD-10-CM | POA: Insufficient documentation

## 2017-08-01 DIAGNOSIS — N62 Hypertrophy of breast: Secondary | ICD-10-CM | POA: Insufficient documentation

## 2017-08-01 DIAGNOSIS — I110 Hypertensive heart disease with heart failure: Secondary | ICD-10-CM | POA: Diagnosis not present

## 2017-08-01 DIAGNOSIS — I2510 Atherosclerotic heart disease of native coronary artery without angina pectoris: Secondary | ICD-10-CM | POA: Insufficient documentation

## 2017-08-01 DIAGNOSIS — Z8546 Personal history of malignant neoplasm of prostate: Secondary | ICD-10-CM | POA: Diagnosis not present

## 2017-08-01 DIAGNOSIS — J449 Chronic obstructive pulmonary disease, unspecified: Secondary | ICD-10-CM | POA: Diagnosis not present

## 2017-08-01 DIAGNOSIS — Z7982 Long term (current) use of aspirin: Secondary | ICD-10-CM | POA: Insufficient documentation

## 2017-08-01 DIAGNOSIS — E785 Hyperlipidemia, unspecified: Secondary | ICD-10-CM | POA: Insufficient documentation

## 2017-08-01 DIAGNOSIS — Z87891 Personal history of nicotine dependence: Secondary | ICD-10-CM | POA: Diagnosis not present

## 2017-08-01 DIAGNOSIS — I252 Old myocardial infarction: Secondary | ICD-10-CM | POA: Diagnosis not present

## 2017-08-01 DIAGNOSIS — Z9889 Other specified postprocedural states: Secondary | ICD-10-CM | POA: Insufficient documentation

## 2017-08-01 DIAGNOSIS — Z79899 Other long term (current) drug therapy: Secondary | ICD-10-CM | POA: Diagnosis not present

## 2017-08-01 DIAGNOSIS — J9611 Chronic respiratory failure with hypoxia: Secondary | ICD-10-CM | POA: Diagnosis not present

## 2017-08-01 DIAGNOSIS — I255 Ischemic cardiomyopathy: Secondary | ICD-10-CM | POA: Diagnosis not present

## 2017-08-01 DIAGNOSIS — R531 Weakness: Secondary | ICD-10-CM | POA: Insufficient documentation

## 2017-08-01 DIAGNOSIS — G4733 Obstructive sleep apnea (adult) (pediatric): Secondary | ICD-10-CM | POA: Insufficient documentation

## 2017-08-01 DIAGNOSIS — Z955 Presence of coronary angioplasty implant and graft: Secondary | ICD-10-CM | POA: Diagnosis not present

## 2017-08-01 DIAGNOSIS — E039 Hypothyroidism, unspecified: Secondary | ICD-10-CM | POA: Diagnosis not present

## 2017-08-01 LAB — BASIC METABOLIC PANEL
Anion gap: 7 (ref 5–15)
BUN: 13 mg/dL (ref 6–20)
CALCIUM: 9.8 mg/dL (ref 8.9–10.3)
CO2: 27 mmol/L (ref 22–32)
CREATININE: 0.97 mg/dL (ref 0.61–1.24)
Chloride: 102 mmol/L (ref 101–111)
GFR calc Af Amer: >60 mL/min (ref >60)
GFR calc non Af Amer: >60 mL/min (ref >60)
GLUCOSE: 148 mg/dL — AB (ref 65–99)
Potassium: 4.2 mmol/L (ref 3.5–5.1)
Sodium: 136 mmol/L (ref 135–145)

## 2017-08-01 MED ORDER — SACUBITRIL-VALSARTAN 49-51 MG PO TABS: 1.0000 | ORAL_TABLET | Freq: Two times a day (BID) | ORAL | 3 refills | Status: DC

## 2017-08-01 NOTE — Patient Instructions (Addendum)
Increase Entresto 49/51 mg Twice daily   Take Furosemide (Lasix) AS NEEDED  Labs today  Your physician recommends that you schedule a follow-up appointment in: 4 months with echocradiogram

## 2017-08-01 NOTE — Addendum Note (Signed)
Encounter addended by: Jolaine Artist, MD on: 08/01/2017 11:04 PM  Actions taken: Charge Capture section accepted

## 2017-08-01 NOTE — Progress Notes (Signed)
Advanced Heart Failure Clinic Note   Referring Physician: Dr. Orpah Greek, [facility] Primary Care: Dr. Clementeen Graham Primary Cardiologist: New to Dr. Haroldine Laws.   HPI: [name] is a 61 year old male with a past medical history of chronic systolic CHF (EF 70%) due to ischemic cardiomyopathy with a history of MI. AICD, COPD, OSA on CPAP, HTN, and hypothyroidism.   He has a history of multiple MI's. One in 2007 and one in 2014 that resulted in cardiogenic shock and a prolonged ICU hospitalization.   He was admitted to [facility] in June 2018 with hypoglycemia and discharged to Rehab. He was discharged from [place] but quickly readmitted to [facility] with chest pain and SOB. He was told that he had a fluid on his lung and then transferred back to rehab and eventually discharged on 02/07/17. He presented to the ED at [facility] on 02/09/17 with SOB and weakness. Chest X ray with large pleural effusion, he was therefore transferred to [facility] where he underwent a left thoracentesis with transudative fluid.   He underwent repeat right and left heart cath on 02/14/17 that showed severe 3 vessel disease with multiple areas of in-stent restenosis. Right heart with Fick output/index 5.9/3.1. He underwent DES placement x 2 to LCx and mid - RCA. Had a 24-hour Thallium viability study with minimal to no viability in all segments except for the septum. Discharge weight was 158 pounds.   He presents today for HF follow up. Continues to feel good. Weight continues to go up. Was 167 in September and now 180 at home. Is followed by Sharman Cheek in device clinic and Optivol up. Has only been taking lasix 40 prn. Takes it for a week then stops and then fluid builds back up. Last dose of lasix was on Friday. Over last 2-3 weeks has some CP in am but improves once he stands up and moves around. No exertional angina. Walking about 1 mile per day weather permitting. Saw Pulmonary and lung nodule shrinking. Has not  heard back from Cardiac Rehab in [place]. Having painful gynecomastia with spiro.   ICD interrogated personally in clinic: No VT/AF. Optivol up and down. (down today) Activity 1-2 hours  Personally reviewed   Past Medical History:  Diagnosis Date  . AICD (automatic cardioverter/defibrillator) present 2014  . CAD (coronary artery disease)    S/p multiple prior MIs/PCIs // amdx in 2014 with acute stent thrombosis c/b CGS // Admx to [facility] in 0/1749 2/2 a/c systolic HF >> [facility] 11/18/94: mLAD 95 ISR, dLAD 30, D2 99; OM2 95 ISR; pRCA 30/50, mRCA 90/80, dRCA 30 EF 15 >> min to no viability on Thallium study (not surgical candidate) - PCI>> 3.5 x 20 mm Promus DES to the LCx 3 x 38 mm Promus DES to the proximal mid RCA  . Chronic systolic heart failure (HCC)    2/2 to ischemic CM - Echo 02/12/17:  Mild LVH, EF 15-20, mod to severe MR, severe LAE, mild RVE, mod reduced RVSF, mild RAE, mod TR, PASP 65  . COPD (chronic obstructive pulmonary disease) ([place])   . History of MI (myocardial infarction)   . History of pneumonia   . HTN (hypertension)   . Hyperlipidemia 03/08/2017  . Hypothyroidism   . OSA (obstructive sleep apnea) 03/08/2017    Current Outpatient Medications  Medication Sig Dispense Refill  . ALPRAZolam (XANAX) 0.5 MG tablet Take 0.5 mg by mouth at bedtime as needed for anxiety.    Marland Kitchen aspirin EC  81 MG tablet Take 81 mg by mouth daily.    Marland Kitchen atorvastatin (LIPITOR) 20 MG tablet Take 20 mg by mouth at bedtime.    . carvedilol (COREG) 3.125 MG tablet Take 1 tablet (3.125 mg total) by mouth 2 (two) times daily with a meal. 60 tablet 3  . Cholecalciferol (VITAMIN D3) 2000 units TABS Take 2,000 Units by mouth daily.    . digoxin (LANOXIN) 0.125 MG tablet Take 1 tablet (0.125 mg total) by mouth daily. 30 tablet 3  . DULoxetine (CYMBALTA) 60 MG capsule Take 60 mg by mouth daily.    . ferrous sulfate 325 (65 FE) MG tablet Take 1 tablet (325 mg total) by mouth 2 (two) times daily with a meal. 60 tablet 3  .  furosemide (LASIX) 40 MG tablet Take 1 tablet (40 mg total) by mouth as needed for fluid or edema (Weight gain). 15 tablet 3  . ipratropium-albuterol (DUONEB) 0.5-2.5 (3) MG/3ML SOLN Take 3 mLs by nebulization every 6 (six) hours as needed (for shortness of breath). 360 mL 0  . levothyroxine (SYNTHROID, LEVOTHROID) 150 MCG tablet Take 150 mcg by mouth daily before breakfast.    . Multiple Vitamins-Minerals (MULTIVITAMIN PO) Take 1 tablet by mouth daily.    . pantoprazole (PROTONIX) 40 MG tablet Take 1 tablet (40 mg total) by mouth at bedtime. 30 tablet 3  . potassium chloride (K-DUR,KLOR-CON) 10 MEQ tablet Take 1 tablet (10 mEq total) by mouth as needed (take with lasix only). 15 tablet 3  . sacubitril-valsartan (ENTRESTO) 24-26 MG Take 1 tablet by mouth 2 (two) times daily. 60 tablet 3  . spironolactone (ALDACTONE) 25 MG tablet Take 1 tablet (25 mg total) by mouth daily. 30 tablet 3  . ticagrelor (BRILINTA) 90 MG TABS tablet Take 1 tablet (90 mg total) by mouth 2 (two) times daily. 60 tablet 3  . vitamin B-12 (CYANOCOBALAMIN) 1000 MCG tablet Take 2,000 mcg by mouth daily.     No current facility-administered medications for this encounter.     Allergies  Allergen Reactions  . Codeine Other (See Comments) and Nausea Only    Increased work of breathing, diaphoretic  . Mirtazapine     Other reaction(s): Other - See Comments  . Other     Other reaction(s): Other - See Comments      Social History   Socioeconomic History  . Marital status: Widowed    Spouse name: Not on file  . Number of children: Not on file  . Years of education: Not on file  . Highest education level: Not on file  Social Needs  . Financial resource strain: Not on file  . Food insecurity - worry: Not on file  . Food insecurity - inability: Not on file  . Transportation needs - medical: Not on file  . Transportation needs - non-medical: Not on file  Occupational History  . Not on file  Tobacco Use  . Smoking  status: Former Smoker    Packs/day: 1.00    Years: 28.00    Pack years: 28.00    Types: Cigarettes    Start date: 1968    Last attempt to quit: 1996    Years since quitting: 22.9  . Smokeless tobacco: Never Used  Substance and Sexual Activity  . Alcohol use: No  . Drug use: No  . Sexual activity: Not on file  Other Topics Concern  . Not on file  Social History Narrative   Lives in [place]   Attends  [employer]   Disabled carpenter      Family History  Problem Relation Age of Onset  . Diabetes Mother   . Heart disease Mother   . Stroke Mother   . Heart attack Mother   . Heart disease Father   . Heart attack Father   . Stroke Father   . COPD Sister   . Congestive Heart Failure Sister   . Hypertension Sister   . Diabetes Sister   . Hypertension Brother   . Diabetes Sister   . Hypertension Sister   . Diabetes Sister   . Hypertension Sister     Vitals:   08/01/17 1034  BP: 112/84  Pulse: 83  SpO2: 95%  Weight: 188 lb 1.9 oz (85.3 kg)     PHYSICAL EXAM: General:  Well appearing. No resp difficulty HEENT: normal Neck: supple. no JVD. Carotids 2+ bilat; no bruits. No lymphadenopathy or thryomegaly appreciated. Cor: PMI nondisplaced. Regular rate & rhythm. No rubs, gallops or murmurs. Lungs: clear with decreased BS throughout  Abdomen: soft, nontender, nondistended. No hepatosplenomegaly. No bruits or masses. Good bowel sounds. Extremities: no cyanosis, clubbing, rash, edema Neuro: alert & orientedx3, cranial nerves grossly intact. moves all 4 extremities w/o difficulty. Affect pleasant   ASSESSMENT & PLAN:  1.Chronic systolic CHF: ICM, EF 63-33% June 2018 s/p MDT ICD - Stable NYHA II - Volume status up and down by Optivol. Now down  - Increase Entresto 49/51 - Take lasix as needed. May not need it with Entresto. Continue ICM management  - He is having gynecomastia with spiro. We discussed switch to Inspra but he is not sure [facility] will approve.  Says he will continue spiro for now and let us know if it becomes more of a problem. - Continue digoxin 0.125 mg (may ne abnle to stop at next visit) - Continue Coreg 3.125 mg BID.  - Labs today  2. CAD s/p previous MI - Cath 7/1 with severe 3 v CAD with multiple areas of ISR - s/p 2v PCI 02/22/17 by Dr. Burt Knack.  - Anterior wall is non-viable given previous LAD stent thrombosis. 24-hour Thallium viability study showed minimal or no viability in all segments except for septum - Having some CP but doesn't sound anginal. If gets worse he will call me to arrange cath.  - Continue ASA, Brilinta and Crestor.  - Will repeat referral to CR in [place]  3.  Chronic hypoxic respiratory failure with large left pleural effusion - S/p thoracentesis in 6/18 - F/u C/X on 7/5 with small to moderate residual effusion. - Stable.    4. COPD - Stable.  - Follows with PCP, no wheeze.  - Follows with Pulmonary     Glori Bickers, MD 08/01/17

## 2017-08-11 ENCOUNTER — Ambulatory Visit (INDEPENDENT_AMBULATORY_CARE_PROVIDER_SITE_OTHER): Payer: Medicare Other

## 2017-08-11 DIAGNOSIS — Z95810 Presence of automatic (implantable) cardiac defibrillator: Secondary | ICD-10-CM | POA: Diagnosis not present

## 2017-08-11 DIAGNOSIS — I5022 Chronic systolic (congestive) heart failure: Secondary | ICD-10-CM

## 2017-08-12 NOTE — Progress Notes (Signed)
EPIC Encounter for ICM Monitoring  Patient Name: Stephen Cardenas is a 61 y.o. male Date: 08/12/2017 Primary Care Physican: System, Pcp Not In Primary Cardiologist:Bensimhon Electrophysiologist:Allred Dry Weight:182lbs weighs daily      Heart Failure questions reviewed, pt asymptomatic.   Thoracic impedance normal.  Prescribed dosage: Furosemide40 mg take 1 tablet (40 mg total) by mouth as needed for fluid or edema (Weight gain). Patient takes 3 to 4 days of PRN Furosemide when he has weight gain.   Labs: 05/18/2017 Creatinine 1.16, BUN 11, Potassium 4.4, Sodium 142, EGFR 67-77 04/29/2017 Creatinine 1.00, BUN 13, Potassium 3.8, Sodium 132, EGFR >60  03/17/2017 Creatinine 0.87, BUN 16, Potassium 3.4, Sodium 137, EGFR >60 03/08/2017 Creatinine 0.78, BUN 15, Potassium 4.0, Sodium 138, EGFR 97-112  02/23/2017 Creatinine 0.80, BUN 13, Potassium 4.6, Sodium 134, EGFR >60  Due to a large number of results some have not been displayed. A complete set of results can be found in Results Review.  Recommendations: No changes.   Encouraged to call for fluid symptoms.  Follow-up plan: ICM clinic phone appointment on 09/12/2017.    Copy of ICM check sent to Dr. Rayann Heman.   3 month ICM trend: 08/11/2017    1 Year ICM trend:       [name], RN 08/12/2017 11:05 AM

## 2017-08-29 ENCOUNTER — Other Ambulatory Visit: Payer: Self-pay | Admitting: Internal Medicine

## 2017-09-12 ENCOUNTER — Ambulatory Visit (INDEPENDENT_AMBULATORY_CARE_PROVIDER_SITE_OTHER): Payer: Medicare Other

## 2017-09-12 ENCOUNTER — Telehealth: Payer: Self-pay | Admitting: Cardiology

## 2017-09-12 DIAGNOSIS — I5022 Chronic systolic (congestive) heart failure: Secondary | ICD-10-CM

## 2017-09-12 DIAGNOSIS — Z95810 Presence of automatic (implantable) cardiac defibrillator: Secondary | ICD-10-CM

## 2017-09-12 NOTE — Telephone Encounter (Signed)
Spoke with pt and reminded pt of remote transmission that is due today. Pt verbalized understanding.   

## 2017-09-12 NOTE — Progress Notes (Signed)
EPIC Encounter for ICM Monitoring  Patient Name: Stephen Cardenas is a 62 y.o. male Date: 09/12/2017 Primary Care Physican: System, Pcp Not In Primary Cardiologist:Bensimhon Electrophysiologist:Allred Dry Weight:182lbs weighs daily       Heart Failure questions reviewed, pt asymptomatic.   Thoracic impedance normal.  Prescribed dosage: Furosemide40 mg take 1 tablet (40 mg total) 1 tablet daily instead of PRN per approval of Dr Haroldine Laws.   Labs: 05/18/2017 Creatinine1.16, BUN11, Potassium4.4, [id number], [id number] 04/29/2017 Creatinine1.00, BUN13, Potassium3.8, [id number], EGFR>60  03/17/2017 Creatinine0.87, BUN16, Potassium3.4, [id number], EGFR>60 03/08/2017 Creatinine0.78, BUN15, Potassium4.0, [id number], [id number]  02/23/2017 Creatinine0.80, BUN13, Potassium4.6, Sodium134, EGFR>60 Due to a large number of results some have not been displayed. A complete set of results can be found in Results Review.  Recommendations: No changes.   Encouraged to call for fluid symptoms.  Follow-up plan: ICM clinic phone appointment on 10/13/2017.    Copy of ICM check sent to Dr. Rayann Heman.   3 month ICM trend: 09/12/2017    1 Year ICM trend:       [name], RN 09/12/2017 3:06 PM

## 2017-10-03 ENCOUNTER — Other Ambulatory Visit (HOSPITAL_COMMUNITY): Payer: Self-pay | Admitting: Internal Medicine

## 2017-10-07 ENCOUNTER — Ambulatory Visit (INDEPENDENT_AMBULATORY_CARE_PROVIDER_SITE_OTHER): Payer: Self-pay

## 2017-10-07 ENCOUNTER — Ambulatory Visit (INDEPENDENT_AMBULATORY_CARE_PROVIDER_SITE_OTHER): Payer: Medicare Other | Admitting: *Deleted

## 2017-10-07 ENCOUNTER — Encounter: Payer: Self-pay | Admitting: Cardiology

## 2017-10-07 DIAGNOSIS — I255 Ischemic cardiomyopathy: Secondary | ICD-10-CM

## 2017-10-07 DIAGNOSIS — I5022 Chronic systolic (congestive) heart failure: Secondary | ICD-10-CM

## 2017-10-07 DIAGNOSIS — Z95810 Presence of automatic (implantable) cardiac defibrillator: Secondary | ICD-10-CM

## 2017-10-07 LAB — CUP PACEART REMOTE DEVICE CHECK
Battery Voltage: 2.99 V
Brady Statistic AP VP Percent: 2.16 %
Brady Statistic RA Percent Paced: 12.72 %
Brady Statistic RV Percent Paced: 7.34 %
Date Time Interrogation Session: 20190222165327
HighPow Impedance: 56 Ohm
Implantable Lead Implant Date: 20140813
Implantable Lead Location: 753859
Implantable Lead Location: 753860
Implantable Pulse Generator Implant Date: 20140813
Lead Channel Impedance Value: 342 Ohm
Lead Channel Impedance Value: 361 Ohm
Lead Channel Pacing Threshold Pulse Width: 0.4 ms
Lead Channel Sensing Intrinsic Amplitude: 19.25 mV
Lead Channel Setting Pacing Amplitude: 1.75 V
Lead Channel Setting Sensing Sensitivity: 0.3 mV
MDC IDC LEAD IMPLANT DT: 20140813
MDC IDC MSMT BATTERY REMAINING LONGEVITY: 82 mo
MDC IDC MSMT LEADCHNL RA IMPEDANCE VALUE: 399 Ohm
MDC IDC MSMT LEADCHNL RA PACING THRESHOLD AMPLITUDE: 0.875 V
MDC IDC MSMT LEADCHNL RA SENSING INTR AMPL: 2.625 mV
MDC IDC MSMT LEADCHNL RA SENSING INTR AMPL: 2.625 mV
MDC IDC MSMT LEADCHNL RV PACING THRESHOLD AMPLITUDE: 1 V
MDC IDC MSMT LEADCHNL RV PACING THRESHOLD PULSEWIDTH: 0.4 ms
MDC IDC MSMT LEADCHNL RV SENSING INTR AMPL: 19.25 mV
MDC IDC SET LEADCHNL RV PACING AMPLITUDE: 2 V
MDC IDC SET LEADCHNL RV PACING PULSEWIDTH: 0.4 ms
MDC IDC STAT BRADY AP VS PERCENT: 10.97 %
MDC IDC STAT BRADY AS VP PERCENT: 5.08 %
MDC IDC STAT BRADY AS VS PERCENT: 81.79 %

## 2017-10-07 NOTE — Progress Notes (Signed)
EPIC Encounter for ICM Monitoring  Patient Name: Stephen Cardenas is a 61 y.o. male Date: 10/07/2017 Primary Care Physican: System, Pcp Not In Primary Cardiologist:Bensimhon Electrophysiologist:Allred Dry Weight:188lbs (gain from 182 lbs) weighs daily      Received call from patient due to he has a 6 lb weight gain in the last week and stomach bloating. He feels like he has fluid. Remote transmission sent and reviewed.  He says he is following a low salt diet but after discussing he has not been.   Patient said he is breaking Entresto pills in half due to the higher dosage 49-51 mg is causing dizziness and lightheadedness when he rises from sitting to standing position.  Advised would inform Dr Haroldine Laws of the adjustment he made and it is better to check with the physician before stopping or adjusting medication dosage.     Thoracic impedance abnormal suggesting fluid accumulation since 09/29/2017.  Prescribed dosage: Furosemide40 mgtake 1 tablet (40 mg total) 1 tablet daily instead of PRN per approval of Dr Haroldine Laws. Potassium 10 mEq Take 1 tablet (10 mEq total) by mouth as needed (take with lasix only).  Labs: 05/18/2017 Creatinine1.16, BUN11, Potassium4.4, [id number], [id number] 04/29/2017 Creatinine1.00, BUN13, Potassium3.8, [id number], EGFR>60  03/17/2017 Creatinine0.87, BUN16, Potassium3.4, [id number], EGFR>60 03/08/2017 Creatinine0.78, BUN15, Potassium4.0, [id number], [id number]  02/23/2017 Creatinine0.80, BUN13, Potassium4.6, Sodium134, EGFR>60 Due to a large number of results some have not been displayed. A complete set of results can be found in Results Review.  Recommendations: Advised take Furosemide 40 mg 1 tablet twice a day x 3 days and take Potassium 10 mEq 2 tablets x 3 days.  After 3rd day, resume prescribed dosage of Potassium and Furosemide.   Follow-up plan: ICM clinic phone appointment on 10/10/2017 to recheck fluid levels.    Copy of ICM  check sent to Dr. Rayann Heman and Dr Haroldine Laws for review and if any further recommendations will call him back.     3 month ICM trend: 10/07/2017    1 Year ICM trend:       [name], RN 10/07/2017 11:56 AM

## 2017-10-07 NOTE — Progress Notes (Signed)
Remote ICD transmission.   

## 2017-10-10 ENCOUNTER — Telehealth (HOSPITAL_COMMUNITY): Payer: Self-pay | Admitting: *Deleted

## 2017-10-10 ENCOUNTER — Encounter: Payer: Self-pay | Admitting: *Deleted

## 2017-10-10 ENCOUNTER — Ambulatory Visit (INDEPENDENT_AMBULATORY_CARE_PROVIDER_SITE_OTHER): Payer: Self-pay

## 2017-10-10 DIAGNOSIS — I5022 Chronic systolic (congestive) heart failure: Secondary | ICD-10-CM

## 2017-10-10 DIAGNOSIS — I480 Paroxysmal atrial fibrillation: Secondary | ICD-10-CM | POA: Insufficient documentation

## 2017-10-10 DIAGNOSIS — Z95810 Presence of automatic (implantable) cardiac defibrillator: Secondary | ICD-10-CM

## 2017-10-10 NOTE — Telephone Encounter (Signed)
Is he taking any lasix? If so would stop lasix first before cutting Entresto. If not, agree with cutting to 24/26.

## 2017-10-10 NOTE — Progress Notes (Signed)
EPIC Encounter for ICM Monitoring  Patient Name: Stephen Cardenas is a 62 y.o. male Date: 10/10/2017 Primary Care Physican: System, Pcp Not In Primary Cardiologist:Bensimhon Electrophysiologist:Allred Dry Weight:182lbs (gain from 182 lbs) weighs daily        Heart Failure questions reviewed, pt asymptomatic now. He lost 6 lbs and belly bloating has resolved.    Thoracic impedance returned to normal after 3 days of increased Furosemide.  Prescribed dosage: Furosemide40 mgtake 1 tablet (40 mg total)1 tablet daily instead of PRN per approval of Dr Haroldine Laws.Potassium 10 mEq Take 1 tablet (10 mEq total) by mouth as needed (take with lasix only).  Labs: 05/18/2017 Creatinine1.16, BUN11, Potassium4.4, [id number], [id number] 04/29/2017 Creatinine1.00, BUN13, Potassium3.8, [id number], EGFR>60  03/17/2017 Creatinine0.87, BUN16, Potassium3.4, [id number], EGFR>60 03/08/2017 Creatinine0.78, BUN15, Potassium4.0, [id number], [id number]  02/23/2017 Creatinine0.80, BUN13, Potassium4.6, Sodium134, EGFR>60 A complete set of results can be found in Results Review.  Recommendations: No changes.  He is working with Kevan Rosebush at [facility] clinic about adjusting the [facility] dosage.  Encouraged to call for fluid symptoms.  Follow-up plan: ICM clinic phone appointment on 11/03/2017.    Copy of ICM check sent to Dr. Rayann Heman.   3 month ICM trend: 10/10/2017    1 Year ICM trend:       [name], RN 10/10/2017 10:30 AM

## 2017-10-10 NOTE — Telephone Encounter (Signed)
Received message from Sharman Cheek at [facility], she states that pt told her this morning that he has been cutting his Entresto in half and she wanted [place] to f/u with pt.    I called and spoke w/pt, he states that he did increase Entresto to 49/51 mg BID back in Dec as ordered but a few weeks ago he started feeling dizzy, especially when he stood up so starting last week he started cutting the Entresto in half and just took half a pill BID.  He states that since doing that he has been feeling better.  Advised it is not safe to cut this medication in half, will discuss w/Dr Bensimhon decreasing dose back to 24/26 mg and will call him back, he is agreeable.

## 2017-10-11 ENCOUNTER — Other Ambulatory Visit (HOSPITAL_COMMUNITY): Payer: Self-pay | Admitting: *Deleted

## 2017-10-11 MED ORDER — SACUBITRIL-VALSARTAN 24-26 MG PO TABS: 1.0000 | ORAL_TABLET | Freq: Two times a day (BID) | ORAL | 3 refills | Status: DC

## 2017-10-11 MED ORDER — FERROUS SULFATE 325 (65 FE) MG PO TABS: 325.0000 mg | ORAL_TABLET | Freq: Two times a day (BID) | ORAL | 3 refills | Status: DC

## 2017-10-11 MED ORDER — PANTOPRAZOLE SODIUM 40 MG PO TBEC: 40.0000 mg | DELAYED_RELEASE_TABLET | Freq: Every day | ORAL | 3 refills | Status: DC

## 2017-10-11 NOTE — Telephone Encounter (Signed)
Spoke w/pt, rx for Entresto 24/26 mg BID sent in

## 2017-10-11 NOTE — Telephone Encounter (Signed)
Pt request refills of iron and protonix until he can establish with new pcp, refills sent

## 2017-11-03 ENCOUNTER — Ambulatory Visit (INDEPENDENT_AMBULATORY_CARE_PROVIDER_SITE_OTHER): Payer: Medicare Other

## 2017-11-03 DIAGNOSIS — Z95810 Presence of automatic (implantable) cardiac defibrillator: Secondary | ICD-10-CM | POA: Diagnosis not present

## 2017-11-03 DIAGNOSIS — I5022 Chronic systolic (congestive) heart failure: Secondary | ICD-10-CM

## 2017-11-03 NOTE — Progress Notes (Signed)
EPIC Encounter for ICM Monitoring  Patient Name: Stephen Cardenas is a 62 y.o. male Date: 11/03/2017 Primary Care Physican: System, Pcp Not In Primary Cardiologist:Bensimhon Electrophysiologist:Allred Dry Weight:182lbs (baseline)weighs daily        Heart Failure questions reviewed, pt asymptomatic.   Thoracic impedance normal.  Prescribed dosage: Furosemide40 mgtake 1 tablet (40 mg total)1 tablet daily instead of PRN per approval of Dr Haroldine Laws.Potassium 10 mEqTake 1 tablet (10 mEq total) by mouth as needed (take with lasix only).  Labs: 05/18/2017 Creatinine1.16, BUN11, Potassium4.4, [id number], [id number] 04/29/2017 Creatinine1.00, BUN13, Potassium3.8, [id number], EGFR>60  03/17/2017 Creatinine0.87, BUN16, Potassium3.4, [id number], EGFR>60 03/08/2017 Creatinine0.78, BUN15, Potassium4.0, [id number], [id number]  02/23/2017 Creatinine0.80, BUN13, Potassium4.6, Sodium134, EGFR>60 A complete set of results can be found in Results Review.  Recommendations: No changes.   Encouraged to call for fluid symptoms.  Follow-up plan: ICM clinic phone appointment on 12/08/2017.  Office appointment scheduled 11/28/2017 with Dr. Haroldine Laws.  Copy of ICM check sent to Dr. Rayann Heman.   3 month ICM trend: 11/03/2017    1 Year ICM trend:       [name], RN 11/03/2017 5:05 PM

## 2017-11-28 ENCOUNTER — Other Ambulatory Visit: Payer: Self-pay

## 2017-11-28 ENCOUNTER — Encounter (HOSPITAL_COMMUNITY): Payer: Self-pay | Admitting: Internal Medicine

## 2017-11-28 ENCOUNTER — Ambulatory Visit (HOSPITAL_COMMUNITY)
Admission: RE | Admit: 2017-11-28 | Discharge: 2017-11-28 | Disposition: A | Discharge status: Home / Self Care | Payer: Medicare Other | Source: Ambulatory Visit | Attending: Internal Medicine | Admitting: Internal Medicine

## 2017-11-28 VITALS — BP 108/60 | HR 57 | Wt 191.0 lb

## 2017-11-28 DIAGNOSIS — Z7902 Long term (current) use of antithrombotics/antiplatelets: Secondary | ICD-10-CM | POA: Diagnosis not present

## 2017-11-28 DIAGNOSIS — J449 Chronic obstructive pulmonary disease, unspecified: Secondary | ICD-10-CM | POA: Insufficient documentation

## 2017-11-28 DIAGNOSIS — I252 Old myocardial infarction: Secondary | ICD-10-CM | POA: Insufficient documentation

## 2017-11-28 DIAGNOSIS — Z79890 Hormone replacement therapy (postmenopausal): Secondary | ICD-10-CM | POA: Insufficient documentation

## 2017-11-28 DIAGNOSIS — G4733 Obstructive sleep apnea (adult) (pediatric): Secondary | ICD-10-CM | POA: Insufficient documentation

## 2017-11-28 DIAGNOSIS — Z87891 Personal history of nicotine dependence: Secondary | ICD-10-CM | POA: Diagnosis not present

## 2017-11-28 DIAGNOSIS — E039 Hypothyroidism, unspecified: Secondary | ICD-10-CM | POA: Insufficient documentation

## 2017-11-28 DIAGNOSIS — Z955 Presence of coronary angioplasty implant and graft: Secondary | ICD-10-CM | POA: Diagnosis not present

## 2017-11-28 DIAGNOSIS — Z8249 Family history of ischemic heart disease and other diseases of the circulatory system: Secondary | ICD-10-CM | POA: Diagnosis not present

## 2017-11-28 DIAGNOSIS — Z823 Family history of stroke: Secondary | ICD-10-CM | POA: Diagnosis not present

## 2017-11-28 DIAGNOSIS — Z888 Allergy status to other drugs, medicaments and biological substances status: Secondary | ICD-10-CM | POA: Insufficient documentation

## 2017-11-28 DIAGNOSIS — Z79899 Other long term (current) drug therapy: Secondary | ICD-10-CM | POA: Insufficient documentation

## 2017-11-28 DIAGNOSIS — J90 Pleural effusion, not elsewhere classified: Secondary | ICD-10-CM | POA: Diagnosis not present

## 2017-11-28 DIAGNOSIS — Z95810 Presence of automatic (implantable) cardiac defibrillator: Secondary | ICD-10-CM | POA: Diagnosis not present

## 2017-11-28 DIAGNOSIS — J9611 Chronic respiratory failure with hypoxia: Secondary | ICD-10-CM | POA: Insufficient documentation

## 2017-11-28 DIAGNOSIS — I480 Paroxysmal atrial fibrillation: Secondary | ICD-10-CM | POA: Insufficient documentation

## 2017-11-28 DIAGNOSIS — Z833 Family history of diabetes mellitus: Secondary | ICD-10-CM | POA: Diagnosis not present

## 2017-11-28 DIAGNOSIS — I110 Hypertensive heart disease with heart failure: Secondary | ICD-10-CM | POA: Insufficient documentation

## 2017-11-28 DIAGNOSIS — Z885 Allergy status to narcotic agent status: Secondary | ICD-10-CM | POA: Insufficient documentation

## 2017-11-28 DIAGNOSIS — E785 Hyperlipidemia, unspecified: Secondary | ICD-10-CM | POA: Insufficient documentation

## 2017-11-28 DIAGNOSIS — I2510 Atherosclerotic heart disease of native coronary artery without angina pectoris: Secondary | ICD-10-CM | POA: Diagnosis not present

## 2017-11-28 DIAGNOSIS — Z7982 Long term (current) use of aspirin: Secondary | ICD-10-CM | POA: Diagnosis not present

## 2017-11-28 DIAGNOSIS — I5022 Chronic systolic (congestive) heart failure: Secondary | ICD-10-CM | POA: Insufficient documentation

## 2017-11-28 LAB — BASIC METABOLIC PANEL
Anion gap: 10 (ref 5–15)
BUN: 15 mg/dL (ref 6–20)
CALCIUM: 9.4 mg/dL (ref 8.9–10.3)
CO2: 26 mmol/L (ref 22–32)
CREATININE: 1.2 mg/dL (ref 0.61–1.24)
Chloride: 98 mmol/L — ABNORMAL LOW (ref 101–111)
Glucose, Bld: 131 mg/dL — ABNORMAL HIGH (ref 65–99)
Potassium: 3.9 mmol/L (ref 3.5–5.1)
Sodium: 134 mmol/L — ABNORMAL LOW (ref 135–145)

## 2017-11-28 MED ORDER — FUROSEMIDE 40 MG PO TABS: ORAL_TABLET | ORAL | 3 refills | Status: DC

## 2017-11-28 NOTE — Patient Instructions (Addendum)
Decrease take 40 mg (1 tab) every Monday, Wednesday and Friday. Take extra 40 mg (1 tab) as needed.   Labs drawn today (if we do not call you, then your lab work was stable)   Your physician has requested that you have an echocardiogram. Echocardiography is a painless test that uses sound waves to create images of your heart. It provides your doctor with information about the size and shape of your heart and how well your heart's chambers and valves are working. This procedure takes approximately one hour. There are no restrictions for this procedure.  Your physician recommends that you schedule a follow-up appointment in: 2 months with Dr. Haroldine Laws  an a echocardiogram

## 2017-11-28 NOTE — Progress Notes (Signed)
Advanced Heart Failure Clinic Note   Referring Physician: Dr. Orpah Greek, [facility] Primary Care: Dr. Clementeen Graham Primary Cardiologist: New to Dr. Haroldine Laws.   HPI: Stephen Cardenas is a 62 year old male with a past medical history of chronic systolic CHF (EF 50%) due to ischemic cardiomyopathy with a history of MI. AICD, COPD, OSA on CPAP, HTN, and hypothyroidism.   He has a history of multiple MI's. One in 2007 and one in 2014 that resulted in cardiogenic shock and a prolonged ICU hospitalization.   He was admitted to [facility] in June 2018 with hypoglycemia and discharged to Rehab. He was discharged from [facility] but quickly readmitted to [facility] with chest pain and SOB. He was told that he had a fluid on his lung and then transferred back to rehab and eventually discharged on 02/07/17. He presented to the ED at [facility] on 02/09/17 with SOB and weakness. Chest X ray with large pleural effusion, he was therefore transferred to [facility] where he underwent a left thoracentesis with transudative fluid.   He underwent repeat right and left heart cath on 02/14/17 that showed severe 3 vessel disease with multiple areas of in-stent restenosis. Right heart with Fick output/index 5.9/3.1. He underwent DES placement x 2 to LCx and mid - RCA. Had a 24-hour Thallium viability study with minimal to no viability in all segments except for the septum. Discharge weight was 158 pounds.   He presents today for HF follow up. Recently increased Entresto to 49/51 but couldn't tolerate now back down to 24/26 bid. . Says 3-4x per week gets dizzy when he stands up. Weigh ts stable at 182. No edema, orthopnea or PND. No CP. Taking lasix 40mg  daily. Found to have very brief episodes of AF on ICD by Dr. Rayann Heman   ICD interrogated personally in clinic: No VT. Brief episodes AF. Optivol up and down. Minimally up today but not close to threshold. Activityy 1hr per day Personally reviewed   Past Medical History:    Diagnosis Date  . AICD (automatic cardioverter/defibrillator) present 2014  . CAD (coronary artery disease)    S/p multiple prior MIs/PCIs // amdx in 2014 with acute stent thrombosis c/b CGS // Admx to [facility] in 12/3974 2/2 a/c systolic HF >> [facility] 02/16/40: mLAD 95 ISR, dLAD 30, D2 99; OM2 95 ISR; pRCA 30/50, mRCA 90/80, dRCA 30 EF 15 >> min to no viability on Thallium study (not surgical candidate) - PCI>> 3.5 x 20 mm Promus DES to the LCx 3 x 38 mm Promus DES to the proximal mid RCA  . Chronic systolic heart failure (HCC)    2/2 to ischemic CM - Echo 02/12/17:  Mild LVH, EF 15-20, mod to severe MR, severe LAE, mild RVE, mod reduced RVSF, mild RAE, mod TR, PASP 65  . COPD (chronic obstructive pulmonary disease) ([place])   . History of MI (myocardial infarction)   . History of pneumonia   . HTN (hypertension)   . Hyperlipidemia 03/08/2017  . Hypothyroidism   . OSA (obstructive sleep apnea) 03/08/2017    Current Outpatient Medications  Medication Sig Dispense Refill  . ALPRAZolam (XANAX) 0.5 MG tablet Take 0.5 mg by mouth at bedtime as needed for anxiety.    Marland Kitchen aspirin EC 81 MG tablet Take 81 mg by mouth daily.    Marland Kitchen atorvastatin (LIPITOR) 20 MG tablet Take 20 mg by mouth at bedtime.    . carvedilol (COREG) 3.125 MG tablet Take 1 tablet (3.125 mg total) by  mouth 2 (two) times daily with a meal. 60 tablet 3  . Cholecalciferol (VITAMIN D3) 2000 units TABS Take 2,000 Units by mouth daily.    . digoxin (LANOXIN) 0.125 MG tablet Take 1 tablet (0.125 mg total) by mouth daily. 30 tablet 3  . DULoxetine (CYMBALTA) 60 MG capsule Take 90 mg by mouth daily.    . ferrous sulfate 325 (65 FE) MG tablet Take 1 tablet (325 mg total) by mouth 2 (two) times daily with a meal. 60 tablet 3  . furosemide (LASIX) 40 MG tablet Take 1 tablet (40 mg total) by mouth as needed for fluid or edema (Weight gain). 15 tablet 3  . ipratropium-albuterol (DUONEB) 0.5-2.5 (3) MG/3ML SOLN Take 3 mLs by nebulization every 6 (six) hours as  needed (for shortness of breath). 360 mL 0  . Levothyroxine Sodium 175 MCG CAPS Take 175 mcg by mouth.    . Multiple Vitamins-Minerals (MULTIVITAMIN PO) Take 1 tablet by mouth daily.    . pantoprazole (PROTONIX) 40 MG tablet Take 1 tablet (40 mg total) by mouth at bedtime. 30 tablet 3  . potassium chloride (K-DUR,KLOR-CON) 10 MEQ tablet Take 1 tablet (10 mEq total) by mouth as needed (take with lasix only). 15 tablet 3  . sacubitril-valsartan (ENTRESTO) 24-26 MG Take 1 tablet by mouth 2 (two) times daily. 60 tablet 3  . spironolactone (ALDACTONE) 25 MG tablet Take 1 tablet (25 mg total) by mouth daily. 30 tablet 3  . ticagrelor (BRILINTA) 90 MG TABS tablet Take 1 tablet (90 mg total) by mouth 2 (two) times daily. 60 tablet 3  . vitamin B-12 (CYANOCOBALAMIN) 1000 MCG tablet Take 2,000 mcg by mouth daily.     No current facility-administered medications for this encounter.     Allergies  Allergen Reactions  . Codeine Other (See Comments) and Nausea Only    Increased work of breathing, diaphoretic  . Mirtazapine     Other reaction(s): Other - See Comments  . Other     Other reaction(s): Other - See Comments      Social History   Socioeconomic History  . Marital status: Widowed    Spouse name: Not on file  . Number of children: Not on file  . Years of education: Not on file  . Highest education level: Not on file  Occupational History  . Not on file  Social Needs  . Financial resource strain: Not on file  . Food insecurity:    Worry: Not on file    Inability: Not on file  . Transportation needs:    Medical: Not on file    Non-medical: Not on file  Tobacco Use  . Smoking status: Former Smoker    Packs/day: 1.00    Years: 28.00    Pack years: 28.00    Types: Cigarettes    Start date: 1968    Last attempt to quit: 1996    Years since quitting: 23.3  . Smokeless tobacco: Never Used  Substance and Sexual Activity  . Alcohol use: No  . Drug use: No  . Sexual activity: Not  on file  Lifestyle  . Physical activity:    Days per week: Not on file    Minutes per session: Not on file  . Stress: Not on file  Relationships  . Social connections:    Talks on phone: Not on file    Gets together: Not on file    Attends religious service: Not on file    Active member  of club or organization: Not on file    Attends meetings of clubs or organizations: Not on file    Relationship status: Not on file  . Intimate partner violence:    Fear of current or ex partner: Not on file    Emotionally abused: Not on file    Physically abused: Not on file    Forced sexual activity: Not on file  Other Topics Concern  . Not on file  Social History Narrative   [place]ives in [place]   Attends [employer]   Disabled carpenter      Family History  Problem Relation Age of Onset  . Diabetes Mother   . Heart disease Mother   . Stroke Mother   . Heart attack Mother   . Heart disease Father   . Heart attack Father   . Stroke Father   . COPD Sister   . Congestive Heart Failure Sister   . Hypertension Sister   . Diabetes Sister   . Hypertension Brother   . Diabetes Sister   . Hypertension Sister   . Diabetes Sister   . Hypertension Sister     Vitals:   11/28/17 1341  BP: 108/60  Pulse: (!) 57  SpO2: 95%  Weight: 191 lb (86.6 kg)     PHYSICAL EXAM: General:  Well appearing. No resp difficulty HEENT: normal Neck: supple. no JVD. Carotids 2+ bilat; no bruits. No lymphadenopathy or thryomegaly appreciated. Cor: PMI nondisplaced. Regular rate & rhythm. No rubs, gallops or murmurs. Lungs: clear with decreased BS throughout  Abdomen: soft, nontender, nondistended. No hepatosplenomegaly. No bruits or masses. Good bowel sounds. Extremities: no cyanosis, clubbing, rash, edema Neuro: alert & orientedx3, cranial nerves grossly intact. moves all 4 extremities w/o difficulty. Affect pleasant   ASSESSMENT & PLAN:  1.Chronic systolic CHF: ICM, EF 42-70% June 2018  s/p MDT ICD - Stable NYHA II - ICD reviewed personally. No VT. Low burden AF. Volume status up and down by Optivol. Now down  - Continue Entresto 24/26 bid - unable to tolerate higher doses due to hypotension. - Decrease lasix to 40 MWF. Can take extra as needed.  - Continue spiro (despite mild gynecomastia - he does not think [facility] will approve Inspra)  - Continue digoxin 0.125 mg (may ne able to stop at next visit) - Continue Coreg 3.125 mg BID.  - Labs today - Echo 2 months  2. CAD s/p previous MI - Cath 7/1 with severe 3 v CAD with multiple areas of ISR - s/p 2v PCI 02/22/17 by Dr. Burt Knack.  - Anterior wall is non-viable given previous LAD stent thrombosis. 24-hour Thallium viability study showed minimal or no viability in all segments except for septum - No s/s angina - Continue ASA, Brilinta and Crestor.   3.  Chronic hypoxic respiratory failure with large left pleural effusion - S/p thoracentesis in 6/18 - No recurrence  4. COPD - Stable.  - Follows with PCP, no wheeze.  - Follows with Pulmonary   5. PAF - Brief episodes AF on monitor at next visit will be 12 months since stent. Will switch Brilinta to Eliquis and drop ASA.   Glori Bickers, MD 11/28/17

## 2017-12-08 ENCOUNTER — Ambulatory Visit (INDEPENDENT_AMBULATORY_CARE_PROVIDER_SITE_OTHER): Payer: Medicare Other

## 2017-12-08 DIAGNOSIS — Z95810 Presence of automatic (implantable) cardiac defibrillator: Secondary | ICD-10-CM | POA: Diagnosis not present

## 2017-12-08 DIAGNOSIS — I5022 Chronic systolic (congestive) heart failure: Secondary | ICD-10-CM

## 2017-12-09 ENCOUNTER — Telehealth: Payer: Self-pay

## 2017-12-09 NOTE — Telephone Encounter (Signed)
Remote ICM transmission received.  Attempted call to patient and left message per DPR to return call regarding transmission.  

## 2017-12-09 NOTE — Progress Notes (Signed)
EPIC Encounter for ICM Monitoring  Patient Name: Stephen Cardenas is a 62 y.o. male Date: 12/09/2017 Primary Care Physican: System, Pcp Not In Primary Cardiologist:Bensimhon Electrophysiologist:Allred Dry Weight:Previous weight 182lbs (baseline)weighs daily     Attempted call to patient and unable to reach.  Left message to return call regarding transmission.  Transmission reviewed.    Thoracic impedance abnormal suggesting fluid accumulation since 12/04/2017.  Decreased impedance also from 11/24/2017 - 12/03/2017.  Fluid index close to threshold.  Prescribed dosage: Furosemide40 mgtake 1 tablet every Monday, Wednesday and Friday. Take extra 40 mg (1 tab) as needed.Potassium 10 mEqTake 1 tablet (10 mEq total) by mouth as needed (take with lasix only).  Labs: 05/18/2017 Creatinine1.16, BUN11, Potassium4.4, [id number], [id number] 04/29/2017 Creatinine1.00, BUN13, Potassium3.8, [id number], EGFR>60  03/17/2017 Creatinine0.87, BUN16, Potassium3.4, [id number], EGFR>60 03/08/2017 Creatinine0.78, BUN15, Potassium4.0, [id number], [id number]  02/23/2017 Creatinine0.80, BUN13, Potassium4.6, Sodium134, EGFR>60 A complete set of results can be found in Results Review.  Recommendations: NONE - Unable to reach.  Follow-up plan: ICM clinic phone appointment on 12/15/2017 to recheck fluid levels.    Copy of ICM check sent to Dr. Rayann Heman.   3 month ICM trend: 12/08/2017    1 Year ICM trend:       [name], RN 12/09/2017 12:04 PM

## 2017-12-12 NOTE — Progress Notes (Signed)
Spoke with patient. Transmission reviewed. He reported feeling fine and denied any fluid symptoms. Weight is stable at 183 lbs. Dr Haroldine Laws changed Lasix from daily to 3 times at week at 11/28/2017 office visit.  Current impedance trending below baseline suggesting fluid accumulation.  Advised to limit salt and fluid intake and will recheck fluid levels on 12/15/2017.  If he continues to have fluid accumulation then he can take extra Lasix if needed.

## 2017-12-15 ENCOUNTER — Ambulatory Visit (INDEPENDENT_AMBULATORY_CARE_PROVIDER_SITE_OTHER): Payer: Self-pay

## 2017-12-15 DIAGNOSIS — Z95810 Presence of automatic (implantable) cardiac defibrillator: Secondary | ICD-10-CM

## 2017-12-15 DIAGNOSIS — I5022 Chronic systolic (congestive) heart failure: Secondary | ICD-10-CM

## 2017-12-15 NOTE — Progress Notes (Signed)
EPIC Encounter for ICM Monitoring  Patient Name: Stephen Cardenas is a 61 y.o. male Date: 12/15/2017 Primary Care Physican: System, Pcp Not In Primary Cardiologist:Bensimhon Electrophysiologist:Allred Dry Weight:Previous weight 183lbs (baseline)weighs daily      Heart Failure questions reviewed, pt asymptomatic.  Has not checked weight in the last few days.    Thoracic impedance returned to normal since 12/08/2017 transmission.   Prescribed dosage: Torsemide40 mgtake 1 tablet every Monday, Wednesday and Friday. Take extra 40 mg (1 tab) as needed.Potassium 10 mEqTake 1 tablet (10 mEq total) by mouth as needed (take with lasix only).  Labs: 11/28/2017 Creatinine 1.20, BUN 15, Potassium 3.9, Sodium 134, EGFR >60 08/01/2017 Creatinine 0.97, BUN 13, Potassium 4.2, Sodium 136, EGFR >60 05/18/2017 Creatinine1.16, BUN11, Potassium4.4, Sodium142, [id number] 04/29/2017 Creatinine1.00, BUN13, Potassium3.8, Sodium132, EGFR>60  03/17/2017 Creatinine0.87, BUN16, Potassium3.4, Sodium137, EGFR>60 03/08/2017 Creatinine0.78, BUN15, Potassium4.0, Sodium138, [id number]  02/23/2017 Creatinine0.80, BUN13, Potassium4.6, Sodium134, EGFR>60 A complete set of results can be found in Results Review.  Recommendations: No changes.  Reinforced sodium restriction.  Encouraged to call for fluid symptoms.  Follow-up plan: ICM clinic phone appointment on 01/10/2018.    Copy of ICM check sent to Dr. Allred.   3 month ICM trend: 12/15/2017    1 Year ICM trend:       Laurie S Short, RN 12/15/2017 2:07 PM   

## 2017-12-20 ENCOUNTER — Other Ambulatory Visit: Payer: Self-pay | Admitting: Internal Medicine

## 2018-01-10 ENCOUNTER — Ambulatory Visit (INDEPENDENT_AMBULATORY_CARE_PROVIDER_SITE_OTHER): Payer: Medicare Other | Admitting: *Deleted

## 2018-01-10 ENCOUNTER — Telehealth: Payer: Self-pay

## 2018-01-10 DIAGNOSIS — I5022 Chronic systolic (congestive) heart failure: Secondary | ICD-10-CM

## 2018-01-10 DIAGNOSIS — Z95810 Presence of automatic (implantable) cardiac defibrillator: Secondary | ICD-10-CM

## 2018-01-10 DIAGNOSIS — I255 Ischemic cardiomyopathy: Secondary | ICD-10-CM | POA: Diagnosis not present

## 2018-01-10 NOTE — Progress Notes (Signed)
Remote ICD transmission.   

## 2018-01-10 NOTE — Telephone Encounter (Signed)
Remote ICM transmission received.  Attempted call to patient and left detailed message, per DPR, to return call regarding transmission.    

## 2018-01-10 NOTE — Progress Notes (Signed)
EPIC Encounter for ICM Monitoring  Patient Name: Stephen Cardenas is a 62 y.o. male Date: 01/10/2018 Primary Care Physican: System, Pcp Not In Primary Cardiologist:Bensimhon Electrophysiologist:Allred Dry Weight:Previous weight 183lbs (baseline)weighs daily      Attempted call to patient and unable to reach.  Left message to return call, per DPR, regarding transmission.  Transmission reviewed.    Thoracic impedance abnormal suggesting fluid accumulation since 12/31/2017.  Prescribed dosage: Torsemide40 mgtake 1 tabletevery Monday, Wednesday and Friday. Take extra 40 mg (1 tab) as needed.Potassium 10 mEqTake 1 tablet (10 mEq total) by mouth as needed (take with lasix only).  Labs: 11/28/2017 Creatinine 1.20, BUN 15, Potassium 3.9, Sodium 134, EGFR >60 08/01/2017 Creatinine 0.97, BUN 13, Potassium 4.2, Sodium 136, EGFR >60 05/18/2017 Creatinine1.16, BUN11, Potassium4.4, MCRFVO360, OVPC34-03 04/29/2017 Creatinine1.00, BUN13, Potassium3.8, Sodium132, EGFR>60  03/17/2017 Creatinine0.87, BUN16, Potassium3.4, TCYELY590, EGFR>60 03/08/2017 Creatinine0.78, BUN15, Potassium4.0, Sodium138, [id number]  02/23/2017 Creatinine0.80, BUN13, Potassium4.6, Sodium134, EGFR>60 A complete set of results can be found in Results Review.  Recommendations: NONE - Unable to reach.  If patient reached, will advise to take extra Torsemide.   Follow-up plan: ICM clinic phone appointment on 01/16/2018 (manual send) to recheck fluid levels.  Office appointment scheduled 01/31/2018 with Dr. Haroldine Laws.  Copy of ICM check sent to Dr. Haroldine Laws and Dr. Rayann Heman.   3 month ICM trend: 01/10/2018    1 Year ICM trend:       [name], RN 01/10/2018 2:42 PM

## 2018-01-10 NOTE — Progress Notes (Signed)
Spoke with patient. He had back pain in the last week and was unsure if it was related to kidneys since he has a history of kidney stones. He stopped Lasix about a week and pain has improved but still unsure the cause of the pain.  Advised to resume prescribed Lasix dosage and will recheck fluid levels on 01/16/2018.  Weight increase of 3 lbs and today's weight is 186 lbs.

## 2018-01-12 LAB — CUP PACEART REMOTE DEVICE CHECK
Brady Statistic AP VP Percent: 0.13 %
Brady Statistic AP VS Percent: 5.59 %
Brady Statistic AS VS Percent: 92.2 %
Brady Statistic RV Percent Paced: 2.22 %
HIGH POWER IMPEDANCE MEASURED VALUE: 67 Ohm
Implantable Lead Implant Date: 20140813
Implantable Lead Location: 753859
Implantable Pulse Generator Implant Date: 20140813
Lead Channel Impedance Value: 342 Ohm
Lead Channel Impedance Value: 418 Ohm
Lead Channel Pacing Threshold Amplitude: 0.875 V
Lead Channel Pacing Threshold Pulse Width: 0.4 ms
Lead Channel Sensing Intrinsic Amplitude: 17.5 mV
Lead Channel Sensing Intrinsic Amplitude: 17.5 mV
Lead Channel Sensing Intrinsic Amplitude: 4 mV
Lead Channel Setting Pacing Amplitude: 1.75 V
Lead Channel Setting Pacing Amplitude: 2 V
Lead Channel Setting Sensing Sensitivity: 0.3 mV
MDC IDC LEAD IMPLANT DT: 20140813
MDC IDC LEAD LOCATION: 753860
MDC IDC MSMT BATTERY REMAINING LONGEVITY: 78 mo
MDC IDC MSMT BATTERY VOLTAGE: 2.99 V
MDC IDC MSMT LEADCHNL RA PACING THRESHOLD AMPLITUDE: 0.875 V
MDC IDC MSMT LEADCHNL RA PACING THRESHOLD PULSEWIDTH: 0.4 ms
MDC IDC MSMT LEADCHNL RA SENSING INTR AMPL: 4 mV
MDC IDC MSMT LEADCHNL RV IMPEDANCE VALUE: 399 Ohm
MDC IDC SESS DTM: 20190528062824
MDC IDC SET LEADCHNL RV PACING PULSEWIDTH: 0.4 ms
MDC IDC STAT BRADY AS VP PERCENT: 2.08 %
MDC IDC STAT BRADY RA PERCENT PACED: 5.68 %

## 2018-01-13 ENCOUNTER — Encounter: Payer: Self-pay | Admitting: Cardiology

## 2018-01-16 ENCOUNTER — Telehealth (HOSPITAL_COMMUNITY): Payer: Self-pay | Admitting: Vascular Surgery

## 2018-01-16 ENCOUNTER — Telehealth: Payer: Self-pay | Admitting: Cardiology

## 2018-01-16 ENCOUNTER — Telehealth: Payer: Self-pay

## 2018-01-16 ENCOUNTER — Ambulatory Visit (INDEPENDENT_AMBULATORY_CARE_PROVIDER_SITE_OTHER): Payer: Self-pay

## 2018-01-16 DIAGNOSIS — I5022 Chronic systolic (congestive) heart failure: Secondary | ICD-10-CM

## 2018-01-16 DIAGNOSIS — Z95810 Presence of automatic (implantable) cardiac defibrillator: Secondary | ICD-10-CM

## 2018-01-16 NOTE — Telephone Encounter (Signed)
Left pt message to change appt date and time due to db not being in office , changed appt from 6/18 11 echo and 12 appt w/ db, to 6/19 2 echo db appt @ 3, asked pt to call back to confirm

## 2018-01-16 NOTE — Telephone Encounter (Signed)
LMOVM reminding pt to send remote transmission.   

## 2018-01-19 ENCOUNTER — Telehealth: Payer: Self-pay

## 2018-01-19 NOTE — Telephone Encounter (Signed)
Remote ICM transmission received.  Attempted call to patient and left message to return call. 

## 2018-01-19 NOTE — Progress Notes (Signed)
EPIC Encounter for ICM Monitoring  Patient Name: Stephen Cardenas is a 62 y.o. male Date: 01/19/2018 Primary Care Physican: System, Pcp Not In Primary Cardiologist:Bensimhon Electrophysiologist:Allred Dry Weight:186lbs (baseline 183 lbs)weighs daily      Heart Failure questions reviewed, pt asymptomatic.  Impedance went back to normal after reuming Lasix but then started accumulating again.  He missed another couple of Lasix doses this week due to traveling.  He resumed his normal prescribed dosage yesterday.   Thoracic impedance abnormal suggesting fluid accumulation since 01/12/2018.  Prescribed dosage: Torsemide40 mgtake 1 tabletevery Monday, Wednesday and Friday. Take extra 40 mg (1 tab) as needed.Potassium 10 mEqTake 1 tablet (10 mEq total) by mouth as needed (take with lasix only).  Labs: 11/28/2017 Creatinine 1.20, BUN 15, Potassium 3.9, Sodium 134, EGFR >60 08/01/2017 Creatinine 0.97, BUN 13, Potassium 4.2, Sodium 136, EGFR >60 05/18/2017 Creatinine1.16, BUN11, Potassium4.4, [id number], [id number] 04/29/2017 Creatinine1.00, BUN13, Potassium3.8, Sodium132, EGFR>60  03/17/2017 Creatinine0.87, BUN16, Potassium3.4, [id number], EGFR>60 03/08/2017 Creatinine0.78, BUN15, Potassium4.0, Sodium138, [id number]  02/23/2017 Creatinine0.80, BUN13, Potassium4.6, Sodium134, EGFR>60 A complete set of results can be found in Results Review.  Recommendations: Reinforced sodium restriction and encouraged to stay on prescribed dosage of Lasix.  Encouraged to call for fluid symptoms.  Follow-up plan: ICM clinic phone appointment on 01/24/2018 to recheck fluid levels.  Office appointment scheduled 02/01/2018 with Dr. Haroldine Laws.  Copy of ICM check sent to Dr. Rayann Heman and Dr. Haroldine Laws.   3 month ICM trend: 01/17/2018    1 Year ICM trend:       [name], RN 01/19/2018 8:07 AM

## 2018-01-24 ENCOUNTER — Ambulatory Visit (INDEPENDENT_AMBULATORY_CARE_PROVIDER_SITE_OTHER): Payer: Self-pay

## 2018-01-24 DIAGNOSIS — I5022 Chronic systolic (congestive) heart failure: Secondary | ICD-10-CM

## 2018-01-24 DIAGNOSIS — Z95810 Presence of automatic (implantable) cardiac defibrillator: Secondary | ICD-10-CM

## 2018-01-24 NOTE — Progress Notes (Signed)
EPIC Encounter for ICM Monitoring  Patient Name: Stephen Cardenas is a 62 y.o. male Date: 01/24/2018 Primary Care Physican: System, Pcp Not In Primary Cardiologist:Bensimhon Electrophysiologist:Allred Dry Weight:181lbs (baseline 183 lbs)weighs daily  Clinical Status (17-Jan-2018 to 24-Jan-2018) Treated VT/VF 0 episodes  AT/AF 6 episodes  Time in AT/AF 0.6 hr/day (2.5%)      Heart Failure questions reviewed, pt reported weight dropped from 286 lbs to 281 lbs   Thoracic impedance returned to normal since last ICM transmission on 01/16/2018 and after resuming prescribed dose of Furosemide.   Prescribed dosage: Torsemide40 mgtake 1 tabletevery Monday, Wednesday and Friday. Take extra 40 mg (1 tab) as needed.Potassium 10 mEqTake 1 tablet (10 mEq total) by mouth as needed (take with lasix only).  Labs: 11/28/2017 Creatinine 1.20, BUN 15, Potassium 3.9, Sodium 134, EGFR >60 08/01/2017 Creatinine 0.97, BUN 13, Potassium 4.2, Sodium 136, EGFR >60 05/18/2017 Creatinine1.16, BUN11, Potassium4.4, [id number], [id number] 04/29/2017 Creatinine1.00, BUN13, Potassium3.8, Sodium132, EGFR>60  03/17/2017 Creatinine0.87, BUN16, Potassium3.4, [id number], EGFR>60 03/08/2017 Creatinine0.78, BUN15, Potassium4.0, Sodium138, [id number]  02/23/2017 Creatinine0.80, BUN13, Potassium4.6, Sodium134, EGFR>60 A complete set of results can be found in Results Review.  Recommendations: No changes.    Encouraged to call for fluid symptoms.  Follow-up plan: ICM clinic phone appointment on 02/14/2018.  Office appointment scheduled 02/01/2018 with Dr. Haroldine Laws.  Copy of ICM check sent to Dr. Rayann Heman.   3 month ICM trend: 01/24/2018    AT/AF   1 Year ICM trend:       [name], RN 01/24/2018 8:01 AM

## 2018-01-31 ENCOUNTER — Other Ambulatory Visit (HOSPITAL_COMMUNITY): Payer: Medicare Other

## 2018-01-31 ENCOUNTER — Encounter (HOSPITAL_COMMUNITY): Payer: Medicare Other | Admitting: Internal Medicine

## 2018-02-01 ENCOUNTER — Ambulatory Visit (HOSPITAL_BASED_OUTPATIENT_CLINIC_OR_DEPARTMENT_OTHER)
Admission: RE | Admit: 2018-02-01 | Discharge: 2018-02-01 | Disposition: A | Discharge status: Home / Self Care | Payer: Medicare Other | Source: Ambulatory Visit | Attending: Internal Medicine | Admitting: Internal Medicine

## 2018-02-01 ENCOUNTER — Ambulatory Visit (HOSPITAL_COMMUNITY)
Admission: RE | Admit: 2018-02-01 | Discharge: 2018-02-01 | Disposition: A | Discharge status: Home / Self Care | Payer: Medicare Other | Source: Ambulatory Visit | Attending: Internal Medicine | Admitting: Internal Medicine

## 2018-02-01 VITALS — BP 116/82 | HR 97 | Wt 188.8 lb

## 2018-02-01 DIAGNOSIS — I110 Hypertensive heart disease with heart failure: Secondary | ICD-10-CM | POA: Insufficient documentation

## 2018-02-01 DIAGNOSIS — J90 Pleural effusion, not elsewhere classified: Secondary | ICD-10-CM | POA: Insufficient documentation

## 2018-02-01 DIAGNOSIS — I5022 Chronic systolic (congestive) heart failure: Secondary | ICD-10-CM | POA: Diagnosis not present

## 2018-02-01 DIAGNOSIS — I252 Old myocardial infarction: Secondary | ICD-10-CM | POA: Diagnosis not present

## 2018-02-01 DIAGNOSIS — E785 Hyperlipidemia, unspecified: Secondary | ICD-10-CM | POA: Diagnosis not present

## 2018-02-01 DIAGNOSIS — I2510 Atherosclerotic heart disease of native coronary artery without angina pectoris: Secondary | ICD-10-CM | POA: Diagnosis not present

## 2018-02-01 DIAGNOSIS — J449 Chronic obstructive pulmonary disease, unspecified: Secondary | ICD-10-CM | POA: Insufficient documentation

## 2018-02-01 DIAGNOSIS — I255 Ischemic cardiomyopathy: Secondary | ICD-10-CM | POA: Insufficient documentation

## 2018-02-01 DIAGNOSIS — E039 Hypothyroidism, unspecified: Secondary | ICD-10-CM | POA: Diagnosis not present

## 2018-02-01 DIAGNOSIS — Z7982 Long term (current) use of aspirin: Secondary | ICD-10-CM | POA: Insufficient documentation

## 2018-02-01 DIAGNOSIS — G4733 Obstructive sleep apnea (adult) (pediatric): Secondary | ICD-10-CM | POA: Insufficient documentation

## 2018-02-01 DIAGNOSIS — I340 Nonrheumatic mitral (valve) insufficiency: Secondary | ICD-10-CM | POA: Insufficient documentation

## 2018-02-01 DIAGNOSIS — I480 Paroxysmal atrial fibrillation: Secondary | ICD-10-CM

## 2018-02-01 DIAGNOSIS — Z87891 Personal history of nicotine dependence: Secondary | ICD-10-CM | POA: Diagnosis not present

## 2018-02-01 DIAGNOSIS — J9611 Chronic respiratory failure with hypoxia: Secondary | ICD-10-CM | POA: Diagnosis not present

## 2018-02-01 DIAGNOSIS — Z8701 Personal history of pneumonia (recurrent): Secondary | ICD-10-CM | POA: Insufficient documentation

## 2018-02-01 DIAGNOSIS — Z950 Presence of cardiac pacemaker: Secondary | ICD-10-CM | POA: Insufficient documentation

## 2018-02-01 DIAGNOSIS — Z79890 Hormone replacement therapy (postmenopausal): Secondary | ICD-10-CM | POA: Diagnosis not present

## 2018-02-01 DIAGNOSIS — Z79899 Other long term (current) drug therapy: Secondary | ICD-10-CM | POA: Diagnosis not present

## 2018-02-01 DIAGNOSIS — Z95810 Presence of automatic (implantable) cardiac defibrillator: Secondary | ICD-10-CM | POA: Diagnosis not present

## 2018-02-01 LAB — BASIC METABOLIC PANEL
Anion gap: 11 (ref 5–15)
BUN: 11 mg/dL (ref 6–20)
CALCIUM: 9.3 mg/dL (ref 8.9–10.3)
CHLORIDE: 100 mmol/L — AB (ref 101–111)
CO2: 29 mmol/L (ref 22–32)
CREATININE: 1.14 mg/dL (ref 0.61–1.24)
Glucose, Bld: 118 mg/dL — ABNORMAL HIGH (ref 65–99)
Potassium: 4.2 mmol/L (ref 3.5–5.1)
SODIUM: 140 mmol/L (ref 135–145)

## 2018-02-01 LAB — CBC
HCT: 35.7 % — ABNORMAL LOW (ref 39.0–52.0)
Hemoglobin: 12.4 g/dL — ABNORMAL LOW (ref 13.0–17.0)
MCH: 31.9 pg (ref 26.0–34.0)
MCHC: 34.7 g/dL (ref 30.0–36.0)
MCV: 91.8 fL (ref 78.0–100.0)
Platelets: 295 10*3/uL (ref 150–400)
RBC: 3.89 MIL/uL — ABNORMAL LOW (ref 4.22–5.81)
RDW: 14 % (ref 11.5–15.5)
WBC: 9.3 10*3/uL (ref 4.0–10.5)

## 2018-02-01 LAB — BRAIN NATRIURETIC PEPTIDE: B NATRIURETIC PEPTIDE 5: 2093.1 pg/mL — AB (ref 0.0–100.0)

## 2018-02-01 MED ORDER — APIXABAN 5 MG PO TABS: 5.0000 mg | ORAL_TABLET | Freq: Two times a day (BID) | ORAL | 6 refills | Status: DC

## 2018-02-01 NOTE — Patient Instructions (Addendum)
Stop Bilinta  Start Eliquis 5 mg Twice daily   Labs today  Your physician has recommended that you have a cardiopulmonary stress test (CPX). CPX testing is a non-invasive measurement of heart and lung function. It replaces a traditional treadmill stress test. This type of test provides a tremendous amount of information that relates not only to your present condition but also for future outcomes. This test combines measurements of you ventilation, respiratory gas exchange in the lungs, electrocardiogram (EKG), blood pressure and physical response before, during, and following an exercise protocol.  Your physician recommends that you schedule a follow-up appointment in: 3 months

## 2018-02-01 NOTE — Progress Notes (Signed)
Advanced Heart Failure Clinic Note   Referring Physician: Dr. Orpah Greek, [facility] Primary Care: Dr. Clementeen Graham Primary Cardiologist: New to Dr. Haroldine Laws.   HPI: Stephen Cardenas is a 62 year old male with a past medical history of chronic systolic CHF (EF 62%) due to ischemic cardiomyopathy with a history of MI. AICD, COPD, OSA on CPAP, HTN, and hypothyroidism.   He has a history of multiple MI's. One in 2007 and one in 2014 that resulted in cardiogenic shock and a prolonged ICU hospitalization.   He was admitted to [facility] in June 2018 with hypoglycemia and discharged to Rehab. He was discharged from [place] but quickly readmitted to [facility] with chest pain and SOB. He was told that he had a fluid on his lung and then transferred back to rehab and eventually discharged on 02/07/17. He presented to the ED at [facility] on 02/09/17 with SOB and weakness. Chest X ray with large pleural effusion, he was therefore transferred to [facility] where he underwent a left thoracentesis with transudative fluid.   He underwent repeat right and left heart cath on 02/14/17 that showed severe 3 vessel disease with multiple areas of in-stent restenosis. Right heart with Fick output/index 5.9/3.1. He underwent DES placement x 2 to LCx and mid - RCA. Had a 24-hour Thallium viability study with minimal to no viability in all segments except for the septum. Discharge weight was 158 pounds.   He presents today for HF follow up. Doing pretty well. Does all ADLs without too much problem. Gets SOB if he walks too far or goes up hill. Takes lasix M/W/F and extra as needed. No. No orthopnea or PND. Increased Entresto to 49/51 but couldn't tolerate due to dizziness now back down to 24/26 bid. Weigh ts stable at 181-182.   ICD interrogated personally in clinic: No VT. Brief episodes AF. Optivol up and down. Minimally up today but not close to threshold. Activityy 1hr per day Personally reviewed  Echo today EF 20% RV  ok Personally reviewed   Past Medical History:  Diagnosis Date  . AICD (automatic cardioverter/defibrillator) present 2014  . CAD (coronary artery disease)    S/p multiple prior MIs/PCIs // amdx in 2014 with acute stent thrombosis c/b CGS // Admx to [facility] in 10/7626 2/2 a/c systolic HF >> [facility] 10/14/49: mLAD 95 ISR, dLAD 30, D2 99; OM2 95 ISR; pRCA 30/50, mRCA 90/80, dRCA 30 EF 15 >> min to no viability on Thallium study (not surgical candidate) - PCI>> 3.5 x 20 mm Promus DES to the LCx 3 x 38 mm Promus DES to the proximal mid RCA  . Chronic systolic heart failure (HCC)    2/2 to ischemic CM - Echo 02/12/17:  Mild LVH, EF 15-20, mod to severe MR, severe LAE, mild RVE, mod reduced RVSF, mild RAE, mod TR, PASP 65  . COPD (chronic obstructive pulmonary disease) ([place])   . History of MI (myocardial infarction)   . History of pneumonia   . HTN (hypertension)   . Hyperlipidemia 03/08/2017  . Hypothyroidism   . OSA (obstructive sleep apnea) 03/08/2017    Current Outpatient Medications  Medication Sig Dispense Refill  . ALPRAZolam (XANAX) 0.5 MG tablet Take 0.5 mg by mouth at bedtime as needed for anxiety.    Marland Kitchen aspirin EC 81 MG tablet Take 81 mg by mouth daily.    . carvedilol (COREG) 3.125 MG tablet Take 1 tablet (3.125 mg total) by mouth 2 (two) times daily with a meal.  60 tablet 3  . Cholecalciferol (VITAMIN D3) 2000 units TABS Take 2,000 Units by mouth daily.    . digoxin (LANOXIN) 0.125 MG tablet Take 1 tablet (0.125 mg total) by mouth daily. 30 tablet 3  . DULoxetine (CYMBALTA) 60 MG capsule Take 90 mg by mouth daily.    . ferrous sulfate 325 (65 FE) MG tablet Take 1 tablet (325 mg total) by mouth 2 (two) times daily with a meal. 60 tablet 3  . furosemide (LASIX) 40 MG tablet Take 40 mg (1 tab) every Monday, Wednesday and Friday. Take extra 40 mg (1 tab) as needed. 20 tablet 3  . Levothyroxine Sodium 175 MCG CAPS Take 175 mcg by mouth.    . Multiple Vitamins-Minerals (MULTIVITAMIN PO) Take 1 tablet  by mouth daily.    . pantoprazole (PROTONIX) 40 MG tablet Take 1 tablet (40 mg total) by mouth at bedtime. 30 tablet 3  . potassium chloride (K-DUR,KLOR-CON) 10 MEQ tablet Take 1 tablet (10 mEq total) by mouth as needed (take with lasix only). 15 tablet 3  . sacubitril-valsartan (ENTRESTO) 24-26 MG Take 1 tablet by mouth 2 (two) times daily. 60 tablet 3  . spironolactone (ALDACTONE) 25 MG tablet Take 1 tablet (25 mg total) by mouth daily. 30 tablet 3  . ticagrelor (BRILINTA) 90 MG TABS tablet Take 1 tablet (90 mg total) by mouth 2 (two) times daily. 60 tablet 3  . vitamin B-12 (CYANOCOBALAMIN) 1000 MCG tablet Take 2,000 mcg by mouth daily.    Marland Kitchen atorvastatin (LIPITOR) 20 MG tablet Take 20 mg by mouth at bedtime.    Marland Kitchen ipratropium-albuterol (DUONEB) 0.5-2.5 (3) MG/3ML SOLN Take 3 mLs by nebulization every 6 (six) hours as needed (for shortness of breath). (Patient not taking: Reported on 02/01/2018) 360 mL 0   No current facility-administered medications for this encounter.     Allergies  Allergen Reactions  . Codeine Other (See Comments) and Nausea Only    Increased work of breathing, diaphoretic  . Mirtazapine     Other reaction(s): Other - See Comments  . Other     Other reaction(s): Other - See Comments      Social History   Socioeconomic History  . Marital status: Widowed    Spouse name: Not on file  . Number of children: Not on file  . Years of education: Not on file  . Highest education level: Not on file  Occupational History  . Not on file  Social Needs  . Financial resource strain: Not on file  . Food insecurity:    Worry: Not on file    Inability: Not on file  . Transportation needs:    Medical: Not on file    Non-medical: Not on file  Tobacco Use  . Smoking status: Former Smoker    Packs/day: 1.00    Years: 28.00    Pack years: 28.00    Types: Cigarettes    Start date: 1968    Last attempt to quit: 1996    Years since quitting: 23.4  . Smokeless tobacco:  Never Used  Substance and Sexual Activity  . Alcohol use: No  . Drug use: No  . Sexual activity: Not on file  Lifestyle  . Physical activity:    Days per week: Not on file    Minutes per session: Not on file  . Stress: Not on file  Relationships  . Social connections:    Talks on phone: Not on file    Gets together: Not  on file    Attends religious service: Not on file    Active member of club or organization: Not on file    Attends meetings of clubs or organizations: Not on file    Relationship status: Not on file  . Intimate partner violence:    Fear of current or ex partner: Not on file    Emotionally abused: Not on file    Physically abused: Not on file    Forced sexual activity: Not on file  Other Topics Concern  . Not on file  Social History Narrative   Lives in [place]   Attends [employer]   Disabled carpenter      Family History  Problem Relation Age of Onset  . Diabetes Mother   . Heart disease Mother   . Stroke Mother   . Heart attack Mother   . Heart disease Father   . Heart attack Father   . Stroke Father   . COPD Sister   . Congestive Heart Failure Sister   . Hypertension Sister   . Diabetes Sister   . Hypertension Brother   . Diabetes Sister   . Hypertension Sister   . Diabetes Sister   . Hypertension Sister     Vitals:   02/01/18 1443  BP: 116/82  Pulse: 97  SpO2: 97%  Weight: 188 lb 12.8 oz (85.6 kg)     PHYSICAL EXAM: General:  Well appearing. No resp difficulty HEENT: normal Neck: supple. no JVD. Carotids 2+ bilat; no bruits. No lymphadenopathy or thryomegaly appreciated. Cor: PMI laterally displaced. Regular rate & rhythm. No rubs, gallops or murmurs. Lungs: clear with mildly decreased BS throughout Abdomen: soft, nontender, nondistended. No hepatosplenomegaly. No bruits or masses. Good bowel sounds. Extremities: no cyanosis, clubbing, rash, edema Neuro: alert & oriented x 3, cranial nerves grossly intact. moves all  4 extremities w/o difficulty. Affect pleasant   ASSESSMENT & PLAN:  1.Chronic systolic CHF: ICM, EF 83-41% June 2018 s/p MDT ICD - Echo today reviewed personally. EF 20%. LV markedly dilated and spherical. RV ok  - Stable NYHA III symptoms - Volume status ok - ICD reviewed personally. No VT. More frequent short bursts of AF. Volume status ok  - Continue Entresto 24/26 bid - unable to tolerate higher doses due to hypotension. - Conitnue lasix 40 MWF. Can take extra as needed.  - Continue spiro (despite mild gynecomastia - he does not think [facility] will approve Inspra)  - Continue digoxin 0.125 mg (may be able to stop at next visit) - Continue Coreg 3.125 mg BID.  - Labs today - Echo 2 months - EF remains very low on echo today. NYHA III symptoms. Normal RV. Will get CPX testing for further assessment with regard to possible needs for advanced therapies.   2. CAD s/p previous MI - Cath 7/1 with severe 3 v CAD with multiple areas of ISR - s/p 2v PCI 02/22/17 by Dr. Burt Knack.  - Anterior wall is non-viable given previous LAD stent thrombosis. 24-hour Thallium viability study showed minimal or no viability in all segments except for septum - No s/s angina - Now about 1 year out from PCI and also has increasing AF - Stop ASA and Brilinta. Start Eliqiuis 5 bid. Continue Crestor.   3.  Chronic hypoxic respiratory failure with large left pleural effusion - S/p thoracentesis in 6/18 - No recurrence  4. COPD - Stable.  - Follows with PCP, no wheeze.  - Follows with Pulmonary  5. PAF - CHADSVASC = 3 (CHF, HTN, CAD) - Increasing brief episodes AF on monitor. Now about  12 months since stent. Will switch Brilinta to Eliquis and drop ASA.   Glori Bickers, MD 02/01/18

## 2018-02-01 NOTE — Progress Notes (Signed)
  Echocardiogram 2D Echocardiogram has been performed.  Stephen Cardenas 02/01/2018, 2:37 PM

## 2018-02-13 ENCOUNTER — Other Ambulatory Visit (HOSPITAL_COMMUNITY): Payer: Self-pay | Admitting: *Deleted

## 2018-02-13 MED ORDER — APIXABAN 5 MG PO TABS: 5.0000 mg | ORAL_TABLET | Freq: Two times a day (BID) | ORAL | 6 refills | Status: DC

## 2018-02-14 ENCOUNTER — Ambulatory Visit (INDEPENDENT_AMBULATORY_CARE_PROVIDER_SITE_OTHER): Payer: Medicare Other

## 2018-02-14 DIAGNOSIS — I5022 Chronic systolic (congestive) heart failure: Secondary | ICD-10-CM | POA: Diagnosis not present

## 2018-02-14 DIAGNOSIS — Z95810 Presence of automatic (implantable) cardiac defibrillator: Secondary | ICD-10-CM | POA: Diagnosis not present

## 2018-02-15 NOTE — Progress Notes (Signed)
EPIC Encounter for ICM Monitoring  Patient Name: Stephen Cardenas is a 62 y.o. male Date: 02/15/2018 Primary Care Physican: System, Pcp Not In Primary Cardiologist:Bensimhon Electrophysiologist:Allred Dry Weight:183lbs (baseline 183 lbs)weighs daily  Clinical Status (24-Jan-2018 to 14-Feb-2018) Treated VT/VF 0 episodes  AT/AF 3 episodes Time in AT/AF <0.1 hr/day (0.2%) Longest AT/AF 49 minutes    Heart Failure questions reviewed, pt  Had weight gain during decreased impedance but is back at baseline. He has a stress test scheduled 03/01/2018 and recent echo showed EF 20% which he was hoping for higher number.  Dr Haroldine Laws has discussed the possibility of advanced therapies in the future.     Thoracic impedance abnormal suggesting fluid accumulation from 01/26/2018 - until returned to baseline 02/14/2018.  Prescribed dosage: Torsemide40 mgtake 1 tabletevery Monday, Wednesday and Friday. Take extra 40 mg (1 tab) as needed.Potassium 10 mEqTake 1 tablet (10 mEq total) by mouth as needed (take with lasix only).  Labs: 11/28/2017 Creatinine 1.20, BUN 15, Potassium 3.9, Sodium 134, EGFR >60 08/01/2017 Creatinine 0.97, BUN 13, Potassium 4.2, Sodium 136, EGFR >60 05/18/2017 Creatinine1.16, BUN11, Potassium4.4, [id number], [id number] 04/29/2017 Creatinine1.00, BUN13, Potassium3.8, Sodium132, EGFR>60  03/17/2017 Creatinine0.87, BUN16, Potassium3.4, [id number], EGFR>60 03/08/2017 Creatinine0.78, BUN15, Potassium4.0, Sodium138, [id number]  02/23/2017 Creatinine0.80, BUN13, Potassium4.6, Sodium134, EGFR>60 A complete set of results can be found in Results Review.  Recommendations: No changes.   Encouraged to call for fluid symptoms.  Follow-up plan: ICM clinic phone appointment on 03/20/2018.  Office appointment scheduled 04/21/2018 with Dr. Rayann Heman.   Copy of ICM check sent to Dr. Rayann Heman.   3 month ICM trend: 02/14/2018    1 Year ICM trend:       [name], RN 02/15/2018 11:30 AM

## 2018-02-23 ENCOUNTER — Other Ambulatory Visit: Payer: Self-pay | Admitting: Internal Medicine

## 2018-03-01 ENCOUNTER — Encounter (HOSPITAL_COMMUNITY): Payer: Self-pay | Admitting: *Deleted

## 2018-03-01 ENCOUNTER — Other Ambulatory Visit (HOSPITAL_COMMUNITY): Payer: Self-pay | Admitting: *Deleted

## 2018-03-01 ENCOUNTER — Ambulatory Visit (HOSPITAL_COMMUNITY): Payer: Medicare Other | Attending: Cardiology

## 2018-03-01 NOTE — Progress Notes (Signed)
Patient arrived for CPX and completed PFTs without difficulty. No complaints or symptoms. EKG demonstrated atrial flutter with a rate of 85 bpm, BP 136/78. PA, Oda Kilts notified and then further discussed with Dr. Haroldine Laws (ordering physician). Exercise test not performed today. PA and MD discussed a treatment plan and shared with patient and his sister. All agreeable with plan.    Stephen Martins, MS, ACSM-RCEP 03/01/2018 12:42 PM

## 2018-03-09 ENCOUNTER — Encounter (HOSPITAL_COMMUNITY)
Admission: RE | Disposition: A | Discharge status: Home / Self Care | Payer: Self-pay | Source: Ambulatory Visit | Attending: Internal Medicine

## 2018-03-09 ENCOUNTER — Ambulatory Visit (HOSPITAL_COMMUNITY): Payer: Medicare Other | Admitting: Anesthesiology

## 2018-03-09 ENCOUNTER — Ambulatory Visit (HOSPITAL_COMMUNITY)
Admission: RE | Admit: 2018-03-09 | Discharge: 2018-03-09 | Disposition: A | Discharge status: Home / Self Care | Payer: Medicare Other | Source: Ambulatory Visit | Attending: Internal Medicine | Admitting: Internal Medicine

## 2018-03-09 ENCOUNTER — Other Ambulatory Visit: Payer: Self-pay

## 2018-03-09 ENCOUNTER — Encounter (HOSPITAL_COMMUNITY): Payer: Self-pay | Admitting: *Deleted

## 2018-03-09 DIAGNOSIS — I4891 Unspecified atrial fibrillation: Secondary | ICD-10-CM | POA: Diagnosis not present

## 2018-03-09 DIAGNOSIS — I5022 Chronic systolic (congestive) heart failure: Secondary | ICD-10-CM | POA: Diagnosis not present

## 2018-03-09 DIAGNOSIS — J9601 Acute respiratory failure with hypoxia: Secondary | ICD-10-CM | POA: Diagnosis not present

## 2018-03-09 DIAGNOSIS — Z9889 Other specified postprocedural states: Secondary | ICD-10-CM | POA: Insufficient documentation

## 2018-03-09 DIAGNOSIS — G4733 Obstructive sleep apnea (adult) (pediatric): Secondary | ICD-10-CM | POA: Insufficient documentation

## 2018-03-09 DIAGNOSIS — Z87891 Personal history of nicotine dependence: Secondary | ICD-10-CM | POA: Diagnosis not present

## 2018-03-09 DIAGNOSIS — I252 Old myocardial infarction: Secondary | ICD-10-CM | POA: Insufficient documentation

## 2018-03-09 DIAGNOSIS — J449 Chronic obstructive pulmonary disease, unspecified: Secondary | ICD-10-CM | POA: Insufficient documentation

## 2018-03-09 DIAGNOSIS — Z95810 Presence of automatic (implantable) cardiac defibrillator: Secondary | ICD-10-CM | POA: Insufficient documentation

## 2018-03-09 DIAGNOSIS — Z836 Family history of other diseases of the respiratory system: Secondary | ICD-10-CM | POA: Insufficient documentation

## 2018-03-09 DIAGNOSIS — Z823 Family history of stroke: Secondary | ICD-10-CM | POA: Diagnosis not present

## 2018-03-09 DIAGNOSIS — E039 Hypothyroidism, unspecified: Secondary | ICD-10-CM | POA: Insufficient documentation

## 2018-03-09 DIAGNOSIS — Z8249 Family history of ischemic heart disease and other diseases of the circulatory system: Secondary | ICD-10-CM | POA: Diagnosis not present

## 2018-03-09 DIAGNOSIS — E785 Hyperlipidemia, unspecified: Secondary | ICD-10-CM | POA: Diagnosis not present

## 2018-03-09 DIAGNOSIS — I110 Hypertensive heart disease with heart failure: Secondary | ICD-10-CM | POA: Diagnosis not present

## 2018-03-09 DIAGNOSIS — I480 Paroxysmal atrial fibrillation: Secondary | ICD-10-CM | POA: Insufficient documentation

## 2018-03-09 DIAGNOSIS — Z888 Allergy status to other drugs, medicaments and biological substances status: Secondary | ICD-10-CM | POA: Insufficient documentation

## 2018-03-09 DIAGNOSIS — I2510 Atherosclerotic heart disease of native coronary artery without angina pectoris: Secondary | ICD-10-CM | POA: Insufficient documentation

## 2018-03-09 DIAGNOSIS — Z885 Allergy status to narcotic agent status: Secondary | ICD-10-CM | POA: Diagnosis not present

## 2018-03-09 HISTORY — PX: CARDIOVERSION: SHX1299

## 2018-03-09 LAB — BASIC METABOLIC PANEL
ANION GAP: 11 (ref 5–15)
BUN: 10 mg/dL (ref 8–23)
CHLORIDE: 103 mmol/L (ref 98–111)
CO2: 25 mmol/L (ref 22–32)
Calcium: 9.4 mg/dL (ref 8.9–10.3)
Creatinine, Ser: 1.22 mg/dL (ref 0.61–1.24)
GFR calc non Af Amer: >60 mL/min (ref >60)
Glucose, Bld: 102 mg/dL — ABNORMAL HIGH (ref 70–99)
Potassium: 4.2 mmol/L (ref 3.5–5.1)
Sodium: 139 mmol/L (ref 135–145)

## 2018-03-09 SURGERY — CARDIOVERSION
Anesthesia: General

## 2018-03-09 MED ORDER — AMIODARONE HCL 200 MG PO TABS: ORAL_TABLET | ORAL | 3 refills | Status: DC

## 2018-03-09 MED ORDER — PROPOFOL 10 MG/ML IV BOLUS
INTRAVENOUS | Status: DC | PRN
  Administered 2018-03-09: 70 mg via INTRAVENOUS

## 2018-03-09 MED ORDER — LIDOCAINE 2% (20 MG/ML) 5 ML SYRINGE
INTRAMUSCULAR | Status: DC | PRN
  Administered 2018-03-09: 60 mg via INTRAVENOUS

## 2018-03-09 MED ORDER — SODIUM CHLORIDE 0.9 % IV SOLN
INTRAVENOUS | Status: DC
  Administered 2018-03-09: 08:00:00 via INTRAVENOUS

## 2018-03-09 NOTE — Anesthesia Preprocedure Evaluation (Signed)
Anesthesia Evaluation  Patient identified by MRN, date of birth, ID band Patient awake    Reviewed: Allergy & Precautions, H&P , NPO status , Patient's Chart, lab work & pertinent test results, reviewed documented beta blocker date and time   Airway Mallampati: I  TM Distance: >3 FB Neck ROM: full    Dental no notable dental hx. (+) Edentulous Upper, Edentulous Lower   Pulmonary shortness of breath and with exertion, sleep apnea , COPD, former smoker,    Pulmonary exam normal breath sounds clear to auscultation       Cardiovascular Exercise Tolerance: Good hypertension, + CAD  + Cardiac Defibrillator  Rhythm:Irregular Rate:Normal + Systolic murmurs and + Diastolic murmurs Echo 9/48 Study Conclusions  - Left ventricle: The cavity size was severely dilated. Wall   thickness was normal. Systolic function was severely reduced. The   estimated ejection fraction was in the range of 15% to 20%.   Diffuse hypokinesis. There is akinesis. - Ventricular septum: Septal motion showed abnormal function and   dyssynergy. - Aorta: Aortic root dimension: 39 mm (ED). - Ascending aorta: The ascending aorta was mildly dilated. - Mitral valve: There was mild regurgitation. - Right ventricle: Pacer wire or catheter noted in right ventricle. - Right atrium: Pacer wire or catheter noted in right atrium.    Neuro/Psych    GI/Hepatic   Endo/Other  Hypothyroidism   Renal/GU      Musculoskeletal   Abdominal   Peds  Hematology   Anesthesia Other Findings   Reproductive/Obstetrics                             Anesthesia Physical Anesthesia Plan  ASA: IV  Anesthesia Plan: MAC   Post-op Pain Management:    Induction: Intravenous  PONV Risk Score and Plan: 2 and Treatment may vary due to age or medical condition and Ondansetron  Airway Management Planned: Nasal Cannula and Natural Airway  Additional  Equipment:   Intra-op Plan:   Post-operative Plan:   Informed Consent: I have reviewed the patients History and Physical, chart, labs and discussed the procedure including the risks, benefits and alternatives for the proposed anesthesia with the patient or authorized representative who has indicated his/her understanding and acceptance.   Dental Advisory Given  Plan Discussed with: CRNA, Anesthesiologist and Surgeon  Anesthesia Plan Comments:         Anesthesia Quick Evaluation

## 2018-03-09 NOTE — Progress Notes (Signed)
patient reports missing one dose of eliquis within the past 2 weeks. He is unsure of when this was but states he missed only one dose. Discussed with Dr. Haroldine Laws, he states fine to proceed with cardioversion today.

## 2018-03-09 NOTE — CV Procedure (Signed)
     DIRECT CURRENT CARDIOVERSION  NAME:  Stephen Cardenas   MRN: [id number] DOB:  19-Apr-1956   ADMIT DATE: 03/09/2018   INDICATIONS: Atrial fibrillation    PROCEDURE:   Informed consent was obtained prior to the procedure. The risks, benefits and alternatives for the procedure were discussed and the patient comprehended these risks. Once an appropriate time out was taken, the patient had the defibrillator pads placed in the anterior and posterior position. The patient then underwent sedation by the anesthesia service. Once an appropriate level of sedation was achieved, the patient received a single biphasic, synchronized 150J shock with prompt conversion to sinus rhythm confirmed by intra-cardiac electrogram via his ICD. No apparent complications.  ICD interrogation done at bedside before and after procedure.   Glori Bickers, MD  9:09 AM

## 2018-03-09 NOTE — Anesthesia Postprocedure Evaluation (Signed)
Anesthesia Post Note  Patient: Stephen Cardenas  Procedure(s) Performed: CARDIOVERSION (N/A )     Patient location during evaluation: PACU Anesthesia Type: General Level of consciousness: awake and alert Pain management: pain level controlled Vital Signs Assessment: post-procedure vital signs reviewed and stable Respiratory status: spontaneous breathing, nonlabored ventilation, respiratory function stable and patient connected to nasal cannula oxygen Cardiovascular status: blood pressure returned to baseline and stable Postop Assessment: no apparent nausea or vomiting Anesthetic complications: no    Last Vitals:  Vitals:   03/09/18 0910 03/09/18 0925  BP: (!) 175/98 112/80  Pulse:    Resp: 16 (!) 25  Temp: 36.6 C   SpO2: 98% 93%    Last Pain:  Vitals:   03/09/18 0925  TempSrc:   PainSc: 0-No pain                 Yehoshua Vitelli

## 2018-03-09 NOTE — Discharge Instructions (Signed)
Electrical Cardioversion, Care After °This sheet gives you information about how to care for yourself after your procedure. Your health care provider may also give you more specific instructions. If you have problems or questions, contact your health care provider. °What can I expect after the procedure? °After the procedure, it is common to have: °· Some redness on the skin where the shocks were given. ° °Follow these instructions at home: °· Do not drive for 24 hours if you were given a medicine to help you relax (sedative). °· Take over-the-counter and prescription medicines only as told by your health care provider. °· Ask your health care provider how to check your pulse. Check it often. °· Rest for 48 hours after the procedure or as told by your health care provider. °· Avoid or limit your caffeine use as told by your health care provider. °Contact a health care provider if: °· You feel like your heart is beating too quickly or your pulse is not regular. °· You have a serious muscle cramp that does not go away. °Get help right away if: °· You have discomfort in your chest. °· You are dizzy or you feel faint. °· You have trouble breathing or you are short of breath. °· Your speech is slurred. °· You have trouble moving an arm or leg on one side of your body. °· Your fingers or toes turn cold or blue. °This information is not intended to replace advice given to you by your health care provider. Make sure you discuss any questions you have with your health care provider. °Document Released: 05/23/2013 Document Revised: 03/05/2016 Document Reviewed: 02/06/2016 °Elsevier Interactive Patient Education © 2018 [employer] ° °

## 2018-03-09 NOTE — Transfer of Care (Signed)
Immediate Anesthesia Transfer of Care Note  Patient: Stephen Cardenas  Procedure(s) Performed: CARDIOVERSION (N/A )  Patient Location: Endoscopy Unit  Anesthesia Type:General  Level of Consciousness: awake, alert , oriented and patient cooperative  Airway & Oxygen Therapy: Patient Spontanous Breathing and Patient connected to nasal cannula oxygen  Post-op Assessment: Report given to RN and Post -op Vital signs reviewed and stable  Post vital signs: Reviewed and stable  Last Vitals:  Vitals Value Taken Time  BP 175/98 03/09/2018  8:59 AM  Temp    Pulse 73 03/09/2018  9:00 AM  Resp 16 03/09/2018  9:00 AM  SpO2 98 % 03/09/2018  9:00 AM  Vitals shown include unvalidated device data.  Last Pain:  Vitals:   03/09/18 0737  TempSrc: Oral  PainSc: 0-No pain         Complications: No apparent anesthesia complications

## 2018-03-09 NOTE — H&P (Signed)
Advanced Heart Failure Clinic Note   Referring Physician: Dr. Orpah Greek, [facility] Primary Care: Dr. Clementeen Graham Primary Cardiologist: New to Dr. Haroldine Laws.   HPI: Stephen Cardenas is a 62 year old male with a past medical history of chronic systolic CHF (EF 11%) due to ischemic cardiomyopathy with a history of MI. AICD, COPD, OSA on CPAP, HTN, and hypothyroidism.   He has a history of multiple MI's. One in 2007 and one in 2014 that resulted in cardiogenic shock and a prolonged ICU hospitalization.   He was admitted to [facility] in June 2018 with hypoglycemia and discharged to Rehab. He was discharged from [facility] but quickly readmitted to [facility] with chest pain and SOB. He was told that he had a fluid on his lung and then transferred back to rehab and eventually discharged on 02/07/17. He presented to the ED at [facility] on 02/09/17 with SOB and weakness. Chest X ray with large pleural effusion, he was therefore transferred to [facility] where he underwent a left thoracentesis with transudative fluid.   He underwent repeat right and left heart cath on 02/14/17 that showed severe 3 vessel disease with multiple areas of in-stent restenosis. Right heart with Fick output/index 5.9/3.1. He underwent DES placement x 2 to LCx and mid - RCA. Had a 24-hour Thallium viability study with minimal to no viability in all segments except for the septum. Discharge weight was 158 pounds.   Seen recently in HF clinic and found to be back in AF/AFL. Feeling more rundown and fatigued.No CP or SOB. No bleeding on Eliquis   Echo 6/19 EF 20% RV ok Personally reviewed   Past Medical History:  Diagnosis Date  . AICD (automatic cardioverter/defibrillator) present 2014  . CAD (coronary artery disease)    S/p multiple prior MIs/PCIs // amdx in 2014 with acute stent thrombosis c/b CGS // Admx to [facility] in 04/1477 2/2 a/c systolic HF >> [facility] 09/24/54: mLAD 95 ISR, dLAD 30, D2 99; OM2 95 ISR; pRCA 30/50, mRCA 90/80, dRCA  30 EF 15 >> min to no viability on Thallium study (not surgical candidate) - PCI>> 3.5 x 20 mm Promus DES to the LCx 3 x 38 mm Promus DES to the proximal mid RCA  . Chronic systolic heart failure (HCC)    2/2 to ischemic CM - Echo 02/12/17:  Mild LVH, EF 15-20, mod to severe MR, severe LAE, mild RVE, mod reduced RVSF, mild RAE, mod TR, PASP 65  . COPD (chronic obstructive pulmonary disease) ([place])   . History of MI (myocardial infarction)   . History of pneumonia   . HTN (hypertension)   . Hyperlipidemia 03/08/2017  . Hypothyroidism   . OSA (obstructive sleep apnea) 03/08/2017    Current Facility-Administered Medications  Medication Dose Route Frequency Provider Last Rate Last Dose  . 0.9 %  sodium chloride infusion   Intravenous Continuous Stephen Cardenas, Shaune Pascal, MD 20 mL/hr at 03/09/18 2130      Allergies  Allergen Reactions  . Codeine Other (See Comments) and Nausea Only    Increased work of breathing, diaphoretic  . Mirtazapine     Other reaction(s): Other - See Comments      Social History   Socioeconomic History  . Marital status: Widowed    Spouse name: Not on file  . Number of children: Not on file  . Years of education: Not on file  . Highest education level: Not on file  Occupational History  . Not on file  Social  Needs  . Financial resource strain: Not on file  . Food insecurity:    Worry: Not on file    Inability: Not on file  . Transportation needs:    Medical: Not on file    Non-medical: Not on file  Tobacco Use  . Smoking status: Former Smoker    Packs/day: 1.00    Years: 28.00    Pack years: 28.00    Types: Cigarettes    Start date: 1968    Last attempt to quit: 1996    Years since quitting: 23.5  . Smokeless tobacco: Never Used  Substance and Sexual Activity  . Alcohol use: No  . Drug use: No  . Sexual activity: Not on file  Lifestyle  . Physical activity:    Days per week: Not on file    Minutes per session: Not on file  . Stress: Not on file    Relationships  . Social connections:    Talks on phone: Not on file    Gets together: Not on file    Attends religious service: Not on file    Active member of club or organization: Not on file    Attends meetings of clubs or organizations: Not on file    Relationship status: Not on file  . Intimate partner violence:    Fear of current or ex partner: Not on file    Emotionally abused: Not on file    Physically abused: Not on file    Forced sexual activity: Not on file  Other Topics Concern  . Not on file  Social History Narrative   Lives in [place]   Attends [employer]   Disabled carpenter      Family History  Problem Relation Age of Onset  . Diabetes Mother   . Heart disease Mother   . Stroke Mother   . Heart attack Mother   . Heart disease Father   . Heart attack Father   . Stroke Father   . COPD Sister   . Congestive Heart Failure Sister   . Hypertension Sister   . Diabetes Sister   . Hypertension Brother   . Diabetes Sister   . Hypertension Sister   . Diabetes Sister   . Hypertension Sister     Vitals:   03/09/18 0737  BP: (!) 124/92  Pulse: 75  Resp: 18  Temp: 97.8 F (36.6 C)  TempSrc: Oral  SpO2: 95%  Weight: 178 lb (80.7 kg)  Height: 5\' 10"  (1.778 m)     PHYSICAL EXAM: General:  Well appearing. No resp difficulty HEENT: normal Neck: supple. no JVD. Carotids 2+ bilat; no bruits. No lymphadenopathy or thryomegaly appreciated. Cor: PMI laterally displaced. Irregular rate & rhythm. No rubs, gallops or murmurs. Lungs: clear Abdomen: soft, nontender, nondistended. No hepatosplenomegaly. No bruits or masses. Good bowel sounds. Extremities: no cyanosis, clubbing, rash, edema Neuro: alert & orientedx3, cranial nerves grossly intact. moves all 4 extremities w/o difficulty. Affect pleasant   ASSESSMENT & PLAN:  1.Chronic systolic CHF: ICM, EF 55-73% June 2018 s/p MDT ICD - Echo 6/19 reviewed personally. EF 20%. LV markedly dilated and  spherical. RV ok  - mildly worsened symptoms in setting of recurrent AF/AFL table NYHA III symptoms - Plan DC-CV today - Continue Entresto 24/26 bid - unable to tolerate higher doses due to hypotension. - Conitnue lasix 40 MWF. Can take extra as needed.  - Continue spiro (despite mild gynecomastia - he does not think [facility] will  [facility] Inspra)  - Continue digoxin 0.125 mg (may be able to stop at next visit) - Continue Coreg 3.125 mg BID.  - EF remains very lowNYHA III symptoms. Normal RV. Will get CPX testing for further assessment with regard to possible needs for advanced therapies.   2. CAD s/p previous MI - Cath 7/1 with severe 3 v CAD with multiple areas of ISR - s/p 2v PCI 02/22/17 by Dr. Burt Knack.  - Anterior wall is non-viable given previous LAD stent thrombosis. 24-hour Thallium viability study showed minimal or no viability in all segments except for septum - No s/s angina - Now about 1 year out from PCI and also has increasing AF - Off ASA and Brilinta. Continue Eliqiuis 5 bid. Continue Crestor.   3. PAF - CHADSVASC = 3 (CHF, HTN, CAD) - For DC-CV today - Continue Eliquis   4  Chronic hypoxic respiratory failure with large left pleural effusion - S/p thoracentesis in 6/18 - No recurrence  5. COPD - Stable.  - Follows with PCP, no wheeze.  - Follows with Pulmonary   Stephen Bickers, MD 03/09/18

## 2018-03-20 ENCOUNTER — Ambulatory Visit (INDEPENDENT_AMBULATORY_CARE_PROVIDER_SITE_OTHER): Payer: Medicare Other

## 2018-03-20 ENCOUNTER — Telehealth: Payer: Self-pay

## 2018-03-20 DIAGNOSIS — Z95810 Presence of automatic (implantable) cardiac defibrillator: Secondary | ICD-10-CM

## 2018-03-20 DIAGNOSIS — I5022 Chronic systolic (congestive) heart failure: Secondary | ICD-10-CM

## 2018-03-20 NOTE — Progress Notes (Signed)
EPIC Encounter for ICM Monitoring  Patient Name: Stephen Cardenas is a 62 y.o. male Date: 03/20/2018 Primary Care Physican: Patient, No Pcp Per Primary Cardiologist:Bensimhon Electrophysiologist:Allred Dry Weight:Previous weight 183lbs (baseline 183 lbs)weighs daily  Clinical Status (09-Mar-2018 to 20-Mar-2018)  Treated VT/VF 0 episodes  AT/AF 1 episode   Time in AT/AF <0.1 hr/day (<0.1%)  Observations (2) (09-Mar-2018 to 20-Mar-2018)  AT/AF >= 6 hr for 1 days.  Patient Activity less than 1 hr/day for 1 weeks.      Attempted call to patient and unable to reach.  Left message to return call regarding transmission.  Transmission reviewed. Cardioversion 03/09/2018   Thoracic impedance abnormal suggesting fluid accumulation starting 02/13/2018.  Prescribed dosage: Furosemide40 mgtake 1 tabletevery Monday, Wednesday and Friday. Take extra 40 mg (1 tab) as needed.Potassium 10 mEqTake 1 tablet (10 mEq total) by mouth as needed (take with lasix only).  Labs: 11/28/2017 Creatinine 1.20, BUN 15, Potassium 3.9, Sodium 134, EGFR >60 08/01/2017 Creatinine 0.97, BUN 13, Potassium 4.2, Sodium 136, EGFR >60 05/18/2017 Creatinine1.16, BUN11, Potassium4.4, [id number], [id number] 04/29/2017 Creatinine1.00, BUN13, Potassium3.8, Sodium132, EGFR>60  03/17/2017 Creatinine0.87, BUN16, Potassium3.4, [id number], EGFR>60 03/08/2017 Creatinine0.78, BUN15, Potassium4.0, Sodium138, [id number]  02/23/2017 Creatinine0.80, BUN13, Potassium4.6, Sodium134, EGFR>60 A complete set of results can be found in Results Review.  Recommendations: NONE - Unable to reach.  If patient reached will discussing taking extra Furosemide as prescribed  Follow-up plan: ICM clinic phone appointment on 03/24/2018 (manual send) to recheck fluid levels.   Office appointment scheduled 04/21/2018 with Dr. Rayann Heman.    Copy of ICM check sent to Dr. Rayann Heman and Dr Haroldine Laws.   3 month ICM trend:  03/20/2018    AT/AF   1 Year ICM trend:       [name], RN 03/20/2018 2:50 PM

## 2018-03-20 NOTE — Progress Notes (Signed)
Attempted call back to patient.

## 2018-03-20 NOTE — Progress Notes (Signed)
Increase lasix to 40 daily. Repeat racing 1 week

## 2018-03-20 NOTE — Telephone Encounter (Signed)
Remote ICM transmission received.  Attempted call to patient and left detailed message, per DPR, to return call regarding transmission.    

## 2018-03-21 NOTE — Progress Notes (Addendum)
Patient returned call.  He stated he is feeling fine. Current weight is stable at 179 lbs.  He reported forgetting Lasix dosage since cardioversion and last dose was 03/08/2018.  Patient has taken 40 mg of Furosemide today.   Advised Dr Haroldine Laws has increased Lasix to 40 mg daily.  Repeat racing 1 week.  Patient will continue to take daily as prescribed unless recommendations are revised.    Advised Dr Haroldine Laws increased Lasix to 40 mg daily based on patient was taking the Mon, Wed and Friday dose as prescribed. Will advise Dr Haroldine Laws he has not been taking Lasix as prescribed and ask for confirmation that he still wants to increase the long term Furosemide to 40 mg daily.  Will also clarify order of repeat racing in a week.

## 2018-03-21 NOTE — Progress Notes (Signed)
Clarified w/Dr Bensimhon, he meant reading and not racing.  He would like pt to recheck his Optivol reading in 1 week

## 2018-03-23 MED ORDER — FUROSEMIDE 40 MG PO TABS: 40.0000 mg | ORAL_TABLET | Freq: Every day | ORAL | 3 refills | Status: DC

## 2018-03-23 NOTE — Progress Notes (Signed)
Call to patient and advised to continue with Dr Bensimhon's order to take Furosemide 40 mg 1 tablet daily as ordered.  Will check fluid levels on 03/28/2018.  Advised to call back if he has any trouble taking daily dosage of Furosemide as ordered.  Med list updated to reflect change in dosage.

## 2018-03-23 NOTE — Addendum Note (Signed)
Addended by: [name] on: 03/23/2018 11:03 AM   Modules accepted: Orders

## 2018-03-28 ENCOUNTER — Ambulatory Visit (INDEPENDENT_AMBULATORY_CARE_PROVIDER_SITE_OTHER): Payer: Medicare Other

## 2018-03-28 DIAGNOSIS — Z95810 Presence of automatic (implantable) cardiac defibrillator: Secondary | ICD-10-CM

## 2018-03-28 DIAGNOSIS — I5022 Chronic systolic (congestive) heart failure: Secondary | ICD-10-CM

## 2018-03-28 NOTE — Progress Notes (Signed)
EPIC Encounter for ICM Monitoring  Patient Name: Arsalan Brisbin is a 62 y.o. male Date: 03/28/2018 Primary Care Physican: Patient, No Pcp Per Primary Cardiologist:Bensimhon Electrophysiologist:Allred Dry Weight:179lbs (baseline 183 lbs)weighs daily      Heart Failure questions reviewed, pt asymptomatic.  Weight decreased in response to increased Furosemide dosage.    Thoracic impedance returned to normal in response to long term Furosemide doseage increased to 40 mg daily as ordered by Dr Haroldine Laws  Prescribed dosage: Furosemide40 mgtake 1 tabletdaily. Take extra 40 mg (1 tab) as needed.Potassium 10 mEqTake 1 tablet (10 mEq total) by mouth as needed (take with lasix only).  Labs: 11/28/2017 Creatinine 1.20, BUN 15, Potassium 3.9, Sodium 134, EGFR >60 08/01/2017 Creatinine 0.97, BUN 13, Potassium 4.2, Sodium 136, EGFR >60 05/18/2017 Creatinine1.16, BUN11, Potassium4.4, [id number], [id number] 04/29/2017 Creatinine1.00, BUN13, Potassium3.8, Sodium132, EGFR>60  03/17/2017 Creatinine0.87, BUN16, Potassium3.4, [id number], EGFR>60 03/08/2017 Creatinine0.78, BUN15, Potassium4.0, Sodium138, [id number]  02/23/2017 Creatinine0.80, BUN13, Potassium4.6, Sodium134, EGFR>60 A complete set of results can be found in Results Review.  Recommendations: No changes.   Encouraged to call for fluid symptoms.  Follow-up plan: ICM clinic phone appointment on 05/22/2018.   Office appointment scheduled 04/21/2018 with Dr. Rayann Heman.    Copy of ICM check sent to Dr. Rayann Heman.   3 month ICM trend: 03/28/2018    1 Year ICM trend:       [name], RN 03/28/2018 1:30 PM

## 2018-04-03 ENCOUNTER — Encounter (HOSPITAL_COMMUNITY): Payer: Self-pay | Admitting: *Deleted

## 2018-04-03 ENCOUNTER — Other Ambulatory Visit (HOSPITAL_COMMUNITY): Payer: Self-pay | Admitting: *Deleted

## 2018-04-03 DIAGNOSIS — I5043 Acute on chronic combined systolic (congestive) and diastolic (congestive) heart failure: Secondary | ICD-10-CM

## 2018-04-04 ENCOUNTER — Telehealth (HOSPITAL_COMMUNITY): Payer: Self-pay | Admitting: *Deleted

## 2018-04-04 NOTE — Telephone Encounter (Signed)
Left message on Stephen Cardenas's cell phone requesting him to call the VAD office to discuss adding PFTs this Thursday at 2pm, following his scheduled CPX and VAD consultation appointments.   Emerson Monte RN [place] Coordinator  24/7 Pager [phone]

## 2018-04-05 ENCOUNTER — Telehealth (HOSPITAL_COMMUNITY): Payer: Self-pay | Admitting: *Deleted

## 2018-04-05 ENCOUNTER — Other Ambulatory Visit: Payer: Self-pay

## 2018-04-05 NOTE — Telephone Encounter (Signed)
Spoke with Mr. Waltner regarding his CPX/VAD consult/PFT appointment tomorrow. He says he is unable to stay for the PFT portion of the appointment. (Will cancel PFT appointment and reschedule once he checks his schedule for availability.) Instructed him to eat a light breakfast, with no caffeine in the morning prior to CPX. Also instructed him to hold his morning dose of Lasix prior to CPX, but to bring his dose with him to take once his test is completed. He verbalized understanding of all instructions.   Emerson Monte RN [place] Coordinator  Office: [phone] 24/7 Pager: [phone]

## 2018-04-05 NOTE — Telephone Encounter (Signed)
Left message for Mr. Lutzke instructing him to call the VAD clinic so I could go over instructions for his appointment tomorrow.   Emerson Monte RN [place] Coordinator  24/7 Pager [phone]

## 2018-04-06 ENCOUNTER — Other Ambulatory Visit (HOSPITAL_COMMUNITY): Payer: Self-pay | Admitting: *Deleted

## 2018-04-06 ENCOUNTER — Encounter (HOSPITAL_COMMUNITY): Payer: Self-pay

## 2018-04-06 ENCOUNTER — Encounter (HOSPITAL_COMMUNITY): Payer: Medicare Other

## 2018-04-06 ENCOUNTER — Other Ambulatory Visit (HOSPITAL_COMMUNITY): Payer: Self-pay | Admitting: Unknown Physician Specialty

## 2018-04-06 ENCOUNTER — Ambulatory Visit (HOSPITAL_COMMUNITY): Payer: Medicare Other

## 2018-04-06 ENCOUNTER — Ambulatory Visit (HOSPITAL_COMMUNITY)
Admission: RE | Admit: 2018-04-06 | Discharge: 2018-04-06 | Disposition: A | Discharge status: Home / Self Care | Payer: Medicare Other | Source: Ambulatory Visit | Attending: Internal Medicine | Admitting: Internal Medicine

## 2018-04-06 VITALS — BP 113/73 | HR 77 | Ht 70.0 in | Wt 185.2 lb

## 2018-04-06 DIAGNOSIS — I5022 Chronic systolic (congestive) heart failure: Secondary | ICD-10-CM

## 2018-04-06 DIAGNOSIS — J449 Chronic obstructive pulmonary disease, unspecified: Secondary | ICD-10-CM | POA: Diagnosis not present

## 2018-04-06 DIAGNOSIS — J90 Pleural effusion, not elsewhere classified: Secondary | ICD-10-CM | POA: Diagnosis not present

## 2018-04-06 DIAGNOSIS — Z95810 Presence of automatic (implantable) cardiac defibrillator: Secondary | ICD-10-CM | POA: Insufficient documentation

## 2018-04-06 DIAGNOSIS — I2510 Atherosclerotic heart disease of native coronary artery without angina pectoris: Secondary | ICD-10-CM | POA: Diagnosis not present

## 2018-04-06 DIAGNOSIS — Z87891 Personal history of nicotine dependence: Secondary | ICD-10-CM | POA: Diagnosis not present

## 2018-04-06 DIAGNOSIS — I110 Hypertensive heart disease with heart failure: Secondary | ICD-10-CM | POA: Insufficient documentation

## 2018-04-06 DIAGNOSIS — J9611 Chronic respiratory failure with hypoxia: Secondary | ICD-10-CM

## 2018-04-06 DIAGNOSIS — Z955 Presence of coronary angioplasty implant and graft: Secondary | ICD-10-CM | POA: Diagnosis not present

## 2018-04-06 DIAGNOSIS — Z7901 Long term (current) use of anticoagulants: Secondary | ICD-10-CM | POA: Diagnosis not present

## 2018-04-06 DIAGNOSIS — I252 Old myocardial infarction: Secondary | ICD-10-CM | POA: Insufficient documentation

## 2018-04-06 DIAGNOSIS — I255 Ischemic cardiomyopathy: Secondary | ICD-10-CM | POA: Diagnosis not present

## 2018-04-06 DIAGNOSIS — Z8249 Family history of ischemic heart disease and other diseases of the circulatory system: Secondary | ICD-10-CM | POA: Diagnosis not present

## 2018-04-06 DIAGNOSIS — Z79899 Other long term (current) drug therapy: Secondary | ICD-10-CM | POA: Insufficient documentation

## 2018-04-06 DIAGNOSIS — E039 Hypothyroidism, unspecified: Secondary | ICD-10-CM | POA: Diagnosis not present

## 2018-04-06 DIAGNOSIS — I480 Paroxysmal atrial fibrillation: Secondary | ICD-10-CM | POA: Insufficient documentation

## 2018-04-06 DIAGNOSIS — R590 Localized enlarged lymph nodes: Secondary | ICD-10-CM

## 2018-04-06 DIAGNOSIS — G4733 Obstructive sleep apnea (adult) (pediatric): Secondary | ICD-10-CM | POA: Diagnosis not present

## 2018-04-06 DIAGNOSIS — Z01818 Encounter for other preprocedural examination: Secondary | ICD-10-CM

## 2018-04-06 DIAGNOSIS — R531 Weakness: Secondary | ICD-10-CM | POA: Diagnosis not present

## 2018-04-06 DIAGNOSIS — E785 Hyperlipidemia, unspecified: Secondary | ICD-10-CM | POA: Insufficient documentation

## 2018-04-06 DIAGNOSIS — I5043 Acute on chronic combined systolic (congestive) and diastolic (congestive) heart failure: Secondary | ICD-10-CM

## 2018-04-06 LAB — PROTIME-INR
INR: 1.29
Prothrombin Time: 16 seconds — ABNORMAL HIGH (ref 11.4–15.2)

## 2018-04-06 LAB — CBC
HCT: 38 % — ABNORMAL LOW (ref 39.0–52.0)
Hemoglobin: 12.9 g/dL — ABNORMAL LOW (ref 13.0–17.0)
MCH: 31.9 pg (ref 26.0–34.0)
MCHC: 33.9 g/dL (ref 30.0–36.0)
MCV: 93.8 fL (ref 78.0–100.0)
Platelets: 299 10*3/uL (ref 150–400)
RBC: 4.05 MIL/uL — ABNORMAL LOW (ref 4.22–5.81)
RDW: 14 % (ref 11.5–15.5)
WBC: 7.8 10*3/uL (ref 4.0–10.5)

## 2018-04-06 NOTE — Progress Notes (Signed)
MCS EDUCATION NOTE:   VAD evaluation consent reviewed by Gracelyn Nurse and designated caregiver Katharine Look. Initial VAD teaching completed with pt and caregiver. Will have patient sign consent at next visit.   VAD educational packet including "HM III Patient Handbook", "HM III Left Ventricular Assist System" packet, and "[place] HM III Patient Education" reviewed in detail with me and given to patient for continued reference.  All questions answered regarding VAD implant,hospital stay, and what to expect when discharged home living with a heart pump. Pt identified Katharine Look as his primary caregiver. Explained need for 24/7 care when pt is discharged home due to sternal precautions, adaptation to living on support, emotional support, consistent and meticulous exit site care and management, medication adherence and high volume of follow up visits with the [facility] after discharge;  both pt and caregiver verbalized understanding of above.   Explained that LVAD can be implanted for two indications in the setting of advanced left ventricular heart failure treatment:  1. Bridge to transplant - used for patients who cannot safely wait for heart transplant without this device.  Or   2. Destination therapy - used for patients until end of life or recovery of heart function.  Patient and caregiver(s) acknowledge that the indication at this point in time for LVAD therapy would be for Destination Therapy due to lack of social support.   Provided brief equipment overview and demonstration with HeartMate III training loop including discussion on the following:  a) power module  b) system controller  c) universal [employer]  d) battery clips  e) Batteries  f) Perc lock  g) Percutaneous lead    One of our current patients visited with them today to answer questions about living on and caring for someone on MCS.   The patient understands that from this discussion it does not  mean that they will receive the device, but that depends on an extensive evaluation process. The patient is aware of the fact that if at anytime they want to stop the evaluation process they can.  All questions have been answered at this time and contact information was provided should they encounter any further questions.  They are both agreeable at this time to the evaluation process and will move forward.    Total Session Time: 60 minutes  Emerson Monte, RN VAD Coordinator   Office: [phone] 24/7 VAD Pager: [phone]

## 2018-04-07 ENCOUNTER — Encounter (HOSPITAL_COMMUNITY): Payer: Self-pay | Admitting: *Deleted

## 2018-04-07 LAB — ABO/RH: ABO/RH(D): A POS

## 2018-04-08 ENCOUNTER — Other Ambulatory Visit (HOSPITAL_COMMUNITY): Payer: Self-pay | Admitting: Internal Medicine

## 2018-04-09 NOTE — Progress Notes (Signed)
Advanced Heart Failure Clinic Note   Referring Physician: Dr. Orpah Greek, [facility] Primary Care: Dr. Clementeen Graham Primary Cardiologist: New to Dr. Haroldine Laws.   HPI: Mr. Meininger is a 62 year old male with a past medical history of chronic systolic CHF (EF 42%) due to ischemic cardiomyopathy with a history of MI. AICD, COPD, OSA on CPAP, HTN, and hypothyroidism.   He has a history of multiple MI's. One in 2007 and one in 2014 that resulted in cardiogenic shock and a prolonged ICU hospitalization.   He was admitted to [facility] in June 2018 with hypoglycemia and discharged to Rehab. He was discharged from [facility] but quickly readmitted to [facility] with chest pain and SOB. He was told that he had a fluid on his lung and then transferred back to rehab and eventually discharged on 02/07/17. He presented to the ED at [facility] on 02/09/17 with SOB and weakness. Chest X ray with large pleural effusion, he was therefore transferred to [facility] where he underwent a left thoracentesis with transudative fluid.   He underwent repeat right and left heart cath on 02/14/17 that showed severe 3 vessel disease with multiple areas of in-stent restenosis. Right heart with Fick output/index 5.9/3.1. He underwent DES placement x 2 to LCx and mid - RCA. Had a 24-hour Thallium viability study with minimal to no viability in all segments except for the septum. Discharge weight was 158 pounds.   In 7/19 seen in HF Clinic with recurrent PAF and NYHA IIIB symptoms. Underwent DC-CV on 7/25. Remains in NSR on amio.   Present today for evaluation for advanced therapies. Feeling better since DC-CV. Can do ADLs and go to store without too much difficult. If goes up steps orr tries to hurry gets SOB. No CP. No orthopnea or PND. Edema well controlled. Very compliant with meds. No bleeding on eliquis.   CPX today (04/06/18)  FVC 3.58 (79%)    FEV1 2.91 (84%)     FEV1/FVC 81 (105%)    MVV 128 (94%)  Resting  HR: 67 Peak HR: 118  (76% age predicted max HR) BP rest: 94/58 BP peak: 102/60 Peak VO2: 14.8 (50% predicted peak VO2) VE/VCO2 slope: 36  Echo 6/19 EF 20% RV ok Personally reviewed   Past Medical History:  Diagnosis Date  . AICD (automatic cardioverter/defibrillator) present 2014  . CAD (coronary artery disease)    S/p multiple prior MIs/PCIs // amdx in 2014 with acute stent thrombosis c/b CGS // Admx to [facility] in 12/9561 2/2 a/c systolic HF >> [facility] 03/23/55: mLAD 95 ISR, dLAD 30, D2 99; OM2 95 ISR; pRCA 30/50, mRCA 90/80, dRCA 30 EF 15 >> min to no viability on Thallium study (not surgical candidate) - PCI>> 3.5 x 20 mm Promus DES to the LCx 3 x 38 mm Promus DES to the proximal mid RCA  . Chronic systolic heart failure (HCC)    2/2 to ischemic CM - Echo 02/12/17:  Mild LVH, EF 15-20, mod to severe MR, severe LAE, mild RVE, mod reduced RVSF, mild RAE, mod TR, PASP 65  . COPD (chronic obstructive pulmonary disease) ([place])   . History of MI (myocardial infarction)   . History of pneumonia   . HTN (hypertension)   . Hyperlipidemia 03/08/2017  . Hypothyroidism   . OSA (obstructive sleep apnea) 03/08/2017    Current Outpatient Medications  Medication Sig Dispense Refill  . ALPRAZolam (XANAX) 0.5 MG tablet Take 0.5-1 mg by mouth at bedtime.     Marland Kitchen  amiodarone (PACERONE) 200 MG tablet Take 1 tablet (200 mg) twice daily for 2 weeks. On 03/23/18, decrease to 1 tablet (200 mg) ONCE daily. 42 tablet 3  . apixaban (ELIQUIS) 5 MG TABS tablet Take 1 tablet (5 mg total) by mouth 2 (two) times daily. 60 tablet 6  . aspirin EC 81 MG tablet Take 81 mg by mouth daily.    Marland Kitchen atorvastatin (LIPITOR) 20 MG tablet Take 20 mg by mouth at bedtime.    . carvedilol (COREG) 3.125 MG tablet Take 1 tablet (3.125 mg total) by mouth 2 (two) times daily with a meal. 60 tablet 3  . Cholecalciferol (VITAMIN D3) 2000 units TABS Take 2,000 Units by mouth daily.    . digoxin (LANOXIN) 0.125 MG tablet Take 1 tablet (0.125 mg total) by  mouth daily. 30 tablet 3  . DULoxetine (CYMBALTA) 30 MG capsule Take 90 mg by mouth at bedtime.     . ferrous sulfate 325 (65 FE) MG tablet Take 1 tablet (325 mg total) by mouth 2 (two) times daily with a meal. 60 tablet 3  . furosemide (LASIX) 40 MG tablet Take 1 tablet (40 mg total) by mouth daily. (Patient taking differently: Take 40 mg by mouth every Monday, Wednesday, and Friday. ) 90 tablet 3  . Levothyroxine Sodium 175 MCG CAPS Take 175 mcg by mouth daily before breakfast.     . Multiple Vitamins-Minerals (MULTIVITAMIN PO) Take 1 tablet by mouth daily.    . pantoprazole (PROTONIX) 40 MG tablet Take 1 tablet (40 mg total) by mouth at bedtime. 30 tablet 3  . potassium chloride (K-DUR,KLOR-CON) 10 MEQ tablet Take 1 tablet (10 mEq total) by mouth as needed (take with lasix only). 15 tablet 3  . sacubitril-valsartan (ENTRESTO) 24-26 MG Take 1 tablet by mouth 2 (two) times daily. 60 tablet 3  . spironolactone (ALDACTONE) 25 MG tablet Take 1 tablet (25 mg total) by mouth daily. 30 tablet 3  . vitamin B-12 (CYANOCOBALAMIN) 1000 MCG tablet Take 2,000 mcg by mouth daily.    Marland Kitchen ipratropium-albuterol (DUONEB) 0.5-2.5 (3) MG/3ML SOLN Take 3 mLs by nebulization every 6 (six) hours as needed (for shortness of breath). (Patient not taking: Reported on 02/01/2018) 360 mL 0   No current facility-administered medications for this encounter.     Allergies  Allergen Reactions  . Codeine Other (See Comments) and Nausea Only    Increased work of breathing, diaphoretic  . Mirtazapine     Other reaction(s): Other - See Comments      Social History   Socioeconomic History  . Marital status: Widowed    Spouse name: Not on file  . Number of children: Not on file  . Years of education: Not on file  . Highest education level: Not on file  Occupational History  . Not on file  Social Needs  . Financial resource strain: Not on file  . Food insecurity:    Worry: Not on file    Inability: Not on file  .  Transportation needs:    Medical: Not on file    Non-medical: Not on file  Tobacco Use  . Smoking status: Former Smoker    Packs/day: 1.00    Years: 28.00    Pack years: 28.00    Types: Cigarettes    Start date: 1968    Last attempt to quit: 1996    Years since quitting: 23.6  . Smokeless tobacco: Never Used  Substance and Sexual Activity  . Alcohol use: No  .  Drug use: No  . Sexual activity: Not on file  Lifestyle  . Physical activity:    Days per week: Not on file    Minutes per session: Not on file  . Stress: Not on file  Relationships  . Social connections:    Talks on phone: Not on file    Gets together: Not on file    Attends religious service: Not on file    Active member of club or organization: Not on file    Attends meetings of clubs or organizations: Not on file    Relationship status: Not on file  . Intimate partner violence:    Fear of current or ex partner: Not on file    Emotionally abused: Not on file    Physically abused: Not on file    Forced sexual activity: Not on file  Other Topics Concern  . Not on file  Social History Narrative   Lives in [place]   Attends [employer]   Disabled carpenter      Family History  Problem Relation Age of Onset  . Diabetes Mother   . Heart disease Mother   . Stroke Mother   . Heart attack Mother   . Heart disease Father   . Heart attack Father   . Stroke Father   . COPD Sister   . Congestive Heart Failure Sister   . Hypertension Sister   . Diabetes Sister   . Hypertension Brother   . Diabetes Sister   . Hypertension Sister   . Diabetes Sister   . Hypertension Sister     Vitals:   04/06/18 0958  BP: 113/73  Pulse: 77  SpO2: 98%  Weight: 84 kg (185 lb 3.2 oz)  Height: 5\' 10"  (1.778 m)     PHYSICAL EXAM: General:  Well appearing. No resp difficulty HEENT: normal Neck: supple. no JVD. Carotids 2+ bilat; no bruits. No lymphadenopathy or thryomegaly appreciated. Cor: PMI laterally  displaced. Regular rate & rhythm. No rubs, gallops or murmurs. Lungs: clear with decrease BS throughout  No wheeze Abdomen: soft, nontender, nondistended. No hepatosplenomegaly. No bruits or masses. Good bowel sounds. Extremities: no cyanosis, clubbing, rash, edema Neuro: alert & orientedx3, cranial nerves grossly intact. moves all 4 extremities w/o difficulty. Affect pleasant    ASSESSMENT & PLAN:  1.Chronic systolic CHF: ICM, EF 52-77% June 2018 s/p MDT ICD - Echo 6/19 reviewed personally. EF 20%. LV markedly dilated and spherical. RV ok  - Symptoms improved with restoration of NSR - NYHA III-IIIB - CPX test reviewed with him today. Indices borderline for advanced therapies but probably a little bit early - Long discussion with VAD coordinators about screening process for advanced therapies. We will send packet to [facility] for consideration of transplant but I suspect lung disease will be prohibitive. Will follow closely for VAD. Repeat full PFTs with DLCO. Arrange for scans. Blood type A-pos - Continue Entresto 24/26 bid - unable to tolerate higher doses due to hypotension. - Volume status ok. Conitnue lasix 40 MWF. Can take extra as needed.  - Continue spiro (despite mild gynecomastia - he does not think [facility] will approve Inspra)  - Continue digoxin 0.125 mg  - Continue Coreg 3.125 mg BID.    2. CAD s/p previous MI - Cath 7/1 with severe 3 v CAD with multiple areas of ISR - s/p 2v PCI 02/22/17 by Dr. Burt Knack.  - Anterior wall is non-viable given previous LAD stent thrombosis. 24-hour Thallium viability study  showed minimal or no viability in all segments except for septum - No s/s of angina.  - Off ASA and Brilinta. Continue Eliqiuis 5 bid. Continue Crestor.   3. PAF - CHADSVASC = 3 (CHF, HTN, CAD) - s/p  DC-CV 03/09/18. M4aintaining NSR - Continue Eliquis 5 bid and amio 200 daily  4  Chronic hypoxic respiratory failure with large left pleural effusion - S/p thoracentesis in 6/18 -  No recurrence  5. COPD - Stable.  - Follows with PCP, no wheeze.  - Follows with Pulmonary  - Repeat PFTs with DLCO pending. (DLCO was quite low previously  Total time spent 45 minutes. Over half that time spent discussing above.    Glori Bickers, MD 04/09/18

## 2018-04-10 ENCOUNTER — Other Ambulatory Visit (HOSPITAL_COMMUNITY): Payer: Self-pay | Admitting: *Deleted

## 2018-04-10 MED ORDER — SACUBITRIL-VALSARTAN 24-26 MG PO TABS: 1.0000 | ORAL_TABLET | Freq: Two times a day (BID) | ORAL | 6 refills | Status: DC

## 2018-04-10 MED ORDER — PANTOPRAZOLE SODIUM 40 MG PO TBEC: 40.0000 mg | DELAYED_RELEASE_TABLET | Freq: Every day | ORAL | 6 refills | Status: DC

## 2018-04-11 ENCOUNTER — Ambulatory Visit (INDEPENDENT_AMBULATORY_CARE_PROVIDER_SITE_OTHER): Payer: Medicare Other | Admitting: *Deleted

## 2018-04-11 ENCOUNTER — Other Ambulatory Visit (HOSPITAL_COMMUNITY): Payer: Self-pay | Admitting: *Deleted

## 2018-04-11 ENCOUNTER — Telehealth (HOSPITAL_COMMUNITY): Payer: Self-pay | Admitting: *Deleted

## 2018-04-11 ENCOUNTER — Telehealth: Payer: Self-pay | Admitting: Cardiology

## 2018-04-11 DIAGNOSIS — I255 Ischemic cardiomyopathy: Secondary | ICD-10-CM | POA: Diagnosis not present

## 2018-04-11 MED ORDER — SACUBITRIL-VALSARTAN 24-26 MG PO TABS: 1.0000 | ORAL_TABLET | Freq: Two times a day (BID) | ORAL | 6 refills | Status: DC

## 2018-04-11 MED ORDER — PANTOPRAZOLE SODIUM 40 MG PO TBEC: 40.0000 mg | DELAYED_RELEASE_TABLET | Freq: Every day | ORAL | 6 refills | Status: AC

## 2018-04-11 NOTE — Telephone Encounter (Signed)
Spoke with Mr. Mark to notify him of the need to pick up his oral contrast in the Radiology department prior to his appointment on September 19th. Also let him know that we were able to schedule all of his test for the same day, and that he would be receiving a letter in the mail with a schedule. He verbalized understanding of this.   Emerson Monte RN [place] Coordinator  Office: [phone]  24/7 Pager: [phone]

## 2018-04-11 NOTE — Telephone Encounter (Signed)
LMOVM reminding pt to send remote transmission.   

## 2018-04-12 NOTE — Progress Notes (Signed)
Remote ICD transmission.   

## 2018-04-13 ENCOUNTER — Encounter: Payer: Self-pay | Admitting: Cardiology

## 2018-04-21 ENCOUNTER — Ambulatory Visit (INDEPENDENT_AMBULATORY_CARE_PROVIDER_SITE_OTHER): Payer: Medicare Other | Admitting: Internal Medicine

## 2018-04-21 ENCOUNTER — Encounter: Payer: Self-pay | Admitting: Internal Medicine

## 2018-04-21 VITALS — BP 118/78 | HR 72 | Ht 70.0 in | Wt 191.0 lb

## 2018-04-21 DIAGNOSIS — I5022 Chronic systolic (congestive) heart failure: Secondary | ICD-10-CM

## 2018-04-21 DIAGNOSIS — I255 Ischemic cardiomyopathy: Secondary | ICD-10-CM

## 2018-04-21 DIAGNOSIS — I4819 Other persistent atrial fibrillation: Secondary | ICD-10-CM

## 2018-04-21 DIAGNOSIS — I481 Persistent atrial fibrillation: Secondary | ICD-10-CM | POA: Diagnosis not present

## 2018-04-21 NOTE — Patient Instructions (Signed)
Medication Instructions:  Continue all current medications.  Labwork: none  Testing/Procedures: none  Follow-Up: Your physician wants you to follow up in:  1 year.  You will receive a reminder letter in the mail one-two months in advance.  If you don't receive a letter, please call our office to schedule the follow up appointment   Any Other Special Instructions Will Be Listed Below (If Applicable). Remote monitoring is used to monitor your Pacemaker of ICD from home. This monitoring reduces the number of office visits required to check your device to one time per year. It allows [place] to keep an eye on the functioning of your device to ensure it is working properly. You are scheduled for a device check from home on 07-11-2018. You may send your transmission at any time that day. If you have a wireless device, the transmission will be sent automatically. After your physician reviews your transmission, you will receive a postcard with your next transmission date.  If you need a refill on your cardiac medications before your next appointment, please call your pharmacy.

## 2018-04-21 NOTE — Progress Notes (Signed)
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 male who presents today for routine electrophysiology followup.  Since last being seen in our clinic, the patient reports doing reasonably well.  He had afib in July for which he was cardioverted and placed on amiodarone.  He is being considered for VAD.  Today, he denies symptoms of palpitations, chest pain, shortness of breath,  lower extremity edema, dizziness, presyncope, syncope, or ICD shocks.  The patient is otherwise without complaint today.   Past Medical History:  Diagnosis Date  . AICD (automatic cardioverter/defibrillator) present 2014  . CAD (coronary artery disease)    S/p multiple prior MIs/PCIs // amdx in 2014 with acute stent thrombosis c/b CGS // Admx to [facility] in 01/7671 2/2 a/c systolic HF >> [facility] 0/9/47: mLAD 95 ISR, dLAD 30, D2 99; OM2 95 ISR; pRCA 30/50, mRCA 90/80, dRCA 30 EF 15 >> min to no viability on Thallium study (not surgical candidate) - PCI>> 3.5 x 20 mm Promus DES to the LCx 3 x 38 mm Promus DES to the proximal mid RCA  . Chronic systolic heart failure (HCC)    2/2 to ischemic CM - Echo 02/12/17:  Mild LVH, EF 15-20, mod to severe MR, severe LAE, mild RVE, mod reduced RVSF, mild RAE, mod TR, PASP 65  . COPD (chronic obstructive pulmonary disease) ([place])   . History of MI (myocardial infarction)   . History of pneumonia   . HTN (hypertension)   . Hyperlipidemia 03/08/2017  . Hypothyroidism   . OSA (obstructive sleep apnea) 03/08/2017   Past Surgical History:  Procedure Laterality Date  . CARDIAC DEFIBRILLATOR PLACEMENT  2014   Medtronic Every XT DR ICD implanted at the [facility] for primary prevention  . CARDIOVERSION N/A 03/09/2018   Procedure: CARDIOVERSION;  Surgeon: Jolaine Artist, MD;  Location: [facility] ENDOSCOPY;  Service: Cardiovascular;  Laterality: N/A;  . CORONARY STENT INTERVENTION N/A 02/22/2017   Procedure: Coronary  Stent Intervention;  Surgeon: Sherren Mocha, MD;  Location: [facility] CV LAB;  Service: Cardiovascular;  Laterality: N/A;  CFX and RCA  . FACIAL RECONSTRUCTION SURGERY Right 1984  . FEMUR FRACTURE SURGERY Right 1984  . RIGHT/LEFT HEART CATH AND CORONARY ANGIOGRAPHY N/A 02/14/2017   Procedure: Right/Left Heart Cath and Coronary Angiography;  Surgeon: Jolaine Artist, MD;  Location: [facility] CV LAB;  Service: Cardiovascular;  Laterality: N/A;  . WRIST FRACTURE SURGERY Right 2015    ROS- all systems are reviewed and negative except as per HPI above  Current Outpatient Medications  Medication Sig Dispense Refill  . ALPRAZolam (XANAX) 0.5 MG tablet Take 0.5-1 mg by mouth at bedtime.     Marland Kitchen amiodarone (PACERONE) 200 MG tablet Take 1 tablet (200 mg) twice daily for 2 weeks. On 03/23/18, decrease to 1 tablet (200 mg) ONCE daily. 42 tablet 3  . apixaban (ELIQUIS) 5 MG TABS tablet Take 1 tablet (5 mg total) by mouth 2 (two) times daily. 60 tablet 6  . aspirin EC 81 MG tablet Take 81 mg by mouth daily.    Marland Kitchen atorvastatin (LIPITOR) 20 MG tablet Take 20 mg by mouth at bedtime.    . carvedilol (COREG) 3.125 MG tablet Take 1 tablet (3.125 mg total) by mouth 2 (two) times daily with a meal. 60 tablet 3  . Cholecalciferol (VITAMIN D3) 2000 units TABS Take 2,000 Units by mouth daily.    . digoxin (  LANOXIN) 0.125 MG tablet Take 1 tablet (0.125 mg total) by mouth daily. 30 tablet 3  . DULoxetine (CYMBALTA) 30 MG capsule Take 90 mg by mouth at bedtime.     . ferrous sulfate 325 (65 FE) MG tablet Take 1 tablet (325 mg total) by mouth 2 (two) times daily with a meal. 60 tablet 3  . furosemide (LASIX) 40 MG tablet Take 1 tablet (40 mg total) by mouth daily. (Patient taking differently: Take 40 mg by mouth every Monday, Wednesday, and Friday. ) 90 tablet 3  . Levothyroxine Sodium 175 MCG CAPS Take 175 mcg by mouth daily before breakfast.     . Multiple Vitamins-Minerals (MULTIVITAMIN PO) Take 1 tablet by mouth  daily.    . pantoprazole (PROTONIX) 40 MG tablet Take 1 tablet (40 mg total) by mouth at bedtime. 30 tablet 6  . potassium chloride (K-DUR,KLOR-CON) 10 MEQ tablet Take 1 tablet (10 mEq total) by mouth as needed (take with lasix only). 15 tablet 3  . sacubitril-valsartan (ENTRESTO) 24-26 MG Take 1 tablet by mouth 2 (two) times daily. 60 tablet 6  . spironolactone (ALDACTONE) 25 MG tablet Take 1 tablet (25 mg total) by mouth daily. 30 tablet 3  . vitamin B-12 (CYANOCOBALAMIN) 1000 MCG tablet Take 2,000 mcg by mouth daily.     No current facility-administered medications for this visit.     Physical Exam: Vitals:   04/21/18 0909  BP: 118/78  Pulse: 72  SpO2: 98%  Weight: 191 lb (86.6 kg)  Height: 5\' 10"  (1.778 m)    GEN- The patient is well appearing, alert and oriented x 3 today.   Head- normocephalic, atraumatic Eyes-  Sclera clear, conjunctiva pink Ears- hearing intact Oropharynx- clear Lungs- Clear to ausculation bilaterally, normal work of breathing Chest- ICD pocket is well healed Heart- Regular rate and rhythm, no murmurs, rubs or gallops, PMI not laterally displaced GI- soft, NT, ND, + BS Extremities- no clubbing, cyanosis, or edema  ICD interrogation- reviewed in detail today,  See PACEART report  Wt Readings from Last 3 Encounters:  04/21/18 191 lb (86.6 kg)  04/06/18 185 lb 3.2 oz (84 kg)  03/09/18 178 lb (80.7 kg)   Assessment and Plan:  1.  Chronic systolic dysfunction/ CAD/ ischemic CM euvolemic today NYHA Class III Followed in CHF clinic and being considered for advanced therapies No ischemic symptoms Stable on an appropriate medical regimen Normal ICD function See Pace Art report No changes today followed in ICM device clinic V paces only 4%.  Has IVCD,  Currently, I doubt that CRT would be of benefit.  2. Persistent afib S/p cardioversion 7/19 Started on amiodarone by VAD team On eliquis for chads2vasc score of 3 LFTs, TFTs, PFTs to be followed by  VAD team If he has further afib, could consider ablation ([place] AF) For now, will continue current management  [facility] Return in a year  Thompson Grayer MD, [facility] 04/21/2018 9:58 AM

## 2018-04-23 LAB — CUP PACEART INCLINIC DEVICE CHECK
Battery Remaining Longevity: 60 mo
Battery Voltage: 2.99 V
Brady Statistic AS VP Percent: 3.68 %
Brady Statistic RA Percent Paced: 9.17 %
Brady Statistic RV Percent Paced: 4.27 %
Date Time Interrogation Session: 20190906133007
HighPow Impedance: 65 Ohm
Implantable Lead Implant Date: 20140813
Implantable Lead Location: 753860
Lead Channel Impedance Value: 342 Ohm
Lead Channel Impedance Value: 418 Ohm
Lead Channel Pacing Threshold Amplitude: 0.875 V
Lead Channel Pacing Threshold Pulse Width: 0.4 ms
Lead Channel Sensing Intrinsic Amplitude: 20.25 mV
Lead Channel Sensing Intrinsic Amplitude: 4.125 mV
Lead Channel Sensing Intrinsic Amplitude: 4.875 mV
MDC IDC LEAD IMPLANT DT: 20140813
MDC IDC LEAD LOCATION: 753859
MDC IDC MSMT LEADCHNL RA IMPEDANCE VALUE: 399 Ohm
MDC IDC MSMT LEADCHNL RV PACING THRESHOLD AMPLITUDE: 0.875 V
MDC IDC MSMT LEADCHNL RV PACING THRESHOLD PULSEWIDTH: 0.4 ms
MDC IDC MSMT LEADCHNL RV SENSING INTR AMPL: 27 mV
MDC IDC PG IMPLANT DT: 20140813
MDC IDC SET LEADCHNL RA PACING AMPLITUDE: 1.75 V
MDC IDC SET LEADCHNL RV PACING AMPLITUDE: 2 V
MDC IDC SET LEADCHNL RV PACING PULSEWIDTH: 0.4 ms
MDC IDC SET LEADCHNL RV SENSING SENSITIVITY: 0.3 mV
MDC IDC STAT BRADY AP VP PERCENT: 0.5 %
MDC IDC STAT BRADY AP VS PERCENT: 8.76 %
MDC IDC STAT BRADY AS VS PERCENT: 87.05 %

## 2018-04-26 LAB — CUP PACEART REMOTE DEVICE CHECK
Battery Remaining Longevity: 65 mo
Brady Statistic AP VS Percent: 11.29 %
Brady Statistic AS VP Percent: 3.31 %
Brady Statistic AS VS Percent: 85.22 %
HighPow Impedance: 69 Ohm
Implantable Lead Implant Date: 20140813
Implantable Lead Implant Date: 20140813
Implantable Lead Location: 753859
Implantable Lead Model: 5076
Lead Channel Impedance Value: 399 Ohm
Lead Channel Pacing Threshold Amplitude: 0.875 V
Lead Channel Pacing Threshold Amplitude: 0.875 V
Lead Channel Pacing Threshold Pulse Width: 0.4 ms
Lead Channel Sensing Intrinsic Amplitude: 2.625 mV
Lead Channel Sensing Intrinsic Amplitude: 2.625 mV
Lead Channel Sensing Intrinsic Amplitude: 22 mV
Lead Channel Setting Pacing Amplitude: 2 V
Lead Channel Setting Pacing Pulse Width: 0.4 ms
MDC IDC LEAD LOCATION: 753860
MDC IDC MSMT BATTERY VOLTAGE: 2.99 V
MDC IDC MSMT LEADCHNL RA PACING THRESHOLD PULSEWIDTH: 0.4 ms
MDC IDC MSMT LEADCHNL RV IMPEDANCE VALUE: 342 Ohm
MDC IDC MSMT LEADCHNL RV IMPEDANCE VALUE: 418 Ohm
MDC IDC MSMT LEADCHNL RV SENSING INTR AMPL: 22 mV
MDC IDC PG IMPLANT DT: 20140813
MDC IDC SESS DTM: 20190828015345
MDC IDC SET LEADCHNL RA PACING AMPLITUDE: 1.75 V
MDC IDC SET LEADCHNL RV SENSING SENSITIVITY: 0.3 mV
MDC IDC STAT BRADY AP VP PERCENT: 0.18 %
MDC IDC STAT BRADY RA PERCENT PACED: 11.43 %
MDC IDC STAT BRADY RV PERCENT PACED: 3.51 %

## 2018-04-28 ENCOUNTER — Other Ambulatory Visit (HOSPITAL_COMMUNITY): Payer: Self-pay | Admitting: *Deleted

## 2018-04-28 MED ORDER — DIGOXIN 125 MCG PO TABS: 0.1250 mg | ORAL_TABLET | Freq: Every day | ORAL | 3 refills | Status: DC

## 2018-04-28 MED ORDER — FUROSEMIDE 40 MG PO TABS: ORAL_TABLET | ORAL | 3 refills | Status: DC

## 2018-04-29 ENCOUNTER — Other Ambulatory Visit (HOSPITAL_COMMUNITY): Payer: Self-pay | Admitting: *Deleted

## 2018-04-29 DIAGNOSIS — I5022 Chronic systolic (congestive) heart failure: Secondary | ICD-10-CM

## 2018-04-29 DIAGNOSIS — Z01818 Encounter for other preprocedural examination: Secondary | ICD-10-CM

## 2018-04-29 DIAGNOSIS — Z114 Encounter for screening for human immunodeficiency virus [HIV]: Secondary | ICD-10-CM

## 2018-04-29 DIAGNOSIS — Z131 Encounter for screening for diabetes mellitus: Secondary | ICD-10-CM

## 2018-04-29 DIAGNOSIS — Z125 Encounter for screening for malignant neoplasm of prostate: Secondary | ICD-10-CM

## 2018-05-01 ENCOUNTER — Other Ambulatory Visit (HOSPITAL_COMMUNITY): Payer: Self-pay | Admitting: *Deleted

## 2018-05-01 DIAGNOSIS — I5022 Chronic systolic (congestive) heart failure: Secondary | ICD-10-CM

## 2018-05-01 DIAGNOSIS — Z131 Encounter for screening for diabetes mellitus: Secondary | ICD-10-CM

## 2018-05-01 DIAGNOSIS — Z01818 Encounter for other preprocedural examination: Secondary | ICD-10-CM

## 2018-05-01 DIAGNOSIS — Z125 Encounter for screening for malignant neoplasm of prostate: Secondary | ICD-10-CM

## 2018-05-04 ENCOUNTER — Ambulatory Visit (HOSPITAL_BASED_OUTPATIENT_CLINIC_OR_DEPARTMENT_OTHER)
Admission: RE | Admit: 2018-05-04 | Discharge: 2018-05-04 | Disposition: A | Discharge status: Home / Self Care | Payer: Medicare Other | Source: Ambulatory Visit | Attending: Internal Medicine | Admitting: Internal Medicine

## 2018-05-04 ENCOUNTER — Ambulatory Visit (HOSPITAL_COMMUNITY)
Admission: RE | Admit: 2018-05-04 | Discharge: 2018-05-04 | Disposition: A | Discharge status: Home / Self Care | Payer: Medicare Other | Source: Ambulatory Visit | Attending: Internal Medicine | Admitting: Internal Medicine

## 2018-05-04 ENCOUNTER — Telehealth (HOSPITAL_COMMUNITY): Payer: Self-pay

## 2018-05-04 ENCOUNTER — Other Ambulatory Visit: Payer: Self-pay | Admitting: Internal Medicine

## 2018-05-04 ENCOUNTER — Other Ambulatory Visit: Payer: Self-pay

## 2018-05-04 ENCOUNTER — Other Ambulatory Visit (HOSPITAL_COMMUNITY): Payer: Self-pay | Admitting: *Deleted

## 2018-05-04 ENCOUNTER — Encounter (HOSPITAL_COMMUNITY): Payer: Self-pay

## 2018-05-04 VITALS — BP 130/88 | HR 70 | Wt 195.0 lb

## 2018-05-04 DIAGNOSIS — J9611 Chronic respiratory failure with hypoxia: Secondary | ICD-10-CM | POA: Diagnosis not present

## 2018-05-04 DIAGNOSIS — G4733 Obstructive sleep apnea (adult) (pediatric): Secondary | ICD-10-CM | POA: Insufficient documentation

## 2018-05-04 DIAGNOSIS — I2510 Atherosclerotic heart disease of native coronary artery without angina pectoris: Secondary | ICD-10-CM | POA: Diagnosis not present

## 2018-05-04 DIAGNOSIS — Z87891 Personal history of nicotine dependence: Secondary | ICD-10-CM | POA: Diagnosis not present

## 2018-05-04 DIAGNOSIS — I480 Paroxysmal atrial fibrillation: Secondary | ICD-10-CM | POA: Diagnosis not present

## 2018-05-04 DIAGNOSIS — K5730 Diverticulosis of large intestine without perforation or abscess without bleeding: Secondary | ICD-10-CM | POA: Insufficient documentation

## 2018-05-04 DIAGNOSIS — I5022 Chronic systolic (congestive) heart failure: Secondary | ICD-10-CM

## 2018-05-04 DIAGNOSIS — Z79890 Hormone replacement therapy (postmenopausal): Secondary | ICD-10-CM | POA: Diagnosis not present

## 2018-05-04 DIAGNOSIS — Z888 Allergy status to other drugs, medicaments and biological substances status: Secondary | ICD-10-CM | POA: Diagnosis not present

## 2018-05-04 DIAGNOSIS — I10 Essential (primary) hypertension: Secondary | ICD-10-CM | POA: Diagnosis not present

## 2018-05-04 DIAGNOSIS — Z7982 Long term (current) use of aspirin: Secondary | ICD-10-CM | POA: Diagnosis not present

## 2018-05-04 DIAGNOSIS — Z114 Encounter for screening for human immunodeficiency virus [HIV]: Secondary | ICD-10-CM

## 2018-05-04 DIAGNOSIS — Z833 Family history of diabetes mellitus: Secondary | ICD-10-CM | POA: Diagnosis not present

## 2018-05-04 DIAGNOSIS — Z885 Allergy status to narcotic agent status: Secondary | ICD-10-CM | POA: Diagnosis not present

## 2018-05-04 DIAGNOSIS — I5043 Acute on chronic combined systolic (congestive) and diastolic (congestive) heart failure: Secondary | ICD-10-CM

## 2018-05-04 DIAGNOSIS — Z823 Family history of stroke: Secondary | ICD-10-CM | POA: Diagnosis not present

## 2018-05-04 DIAGNOSIS — I255 Ischemic cardiomyopathy: Secondary | ICD-10-CM | POA: Insufficient documentation

## 2018-05-04 DIAGNOSIS — Z125 Encounter for screening for malignant neoplasm of prostate: Secondary | ICD-10-CM | POA: Diagnosis not present

## 2018-05-04 DIAGNOSIS — I110 Hypertensive heart disease with heart failure: Secondary | ICD-10-CM | POA: Insufficient documentation

## 2018-05-04 DIAGNOSIS — J449 Chronic obstructive pulmonary disease, unspecified: Secondary | ICD-10-CM

## 2018-05-04 DIAGNOSIS — Z131 Encounter for screening for diabetes mellitus: Secondary | ICD-10-CM

## 2018-05-04 DIAGNOSIS — Z95811 Presence of heart assist device: Secondary | ICD-10-CM | POA: Diagnosis not present

## 2018-05-04 DIAGNOSIS — I252 Old myocardial infarction: Secondary | ICD-10-CM | POA: Diagnosis not present

## 2018-05-04 DIAGNOSIS — I700 Atherosclerosis of aorta: Secondary | ICD-10-CM | POA: Insufficient documentation

## 2018-05-04 DIAGNOSIS — Z01818 Encounter for other preprocedural examination: Secondary | ICD-10-CM | POA: Diagnosis not present

## 2018-05-04 DIAGNOSIS — Z7901 Long term (current) use of anticoagulants: Secondary | ICD-10-CM | POA: Insufficient documentation

## 2018-05-04 DIAGNOSIS — Z8249 Family history of ischemic heart disease and other diseases of the circulatory system: Secondary | ICD-10-CM | POA: Insufficient documentation

## 2018-05-04 DIAGNOSIS — I2583 Coronary atherosclerosis due to lipid rich plaque: Secondary | ICD-10-CM | POA: Diagnosis not present

## 2018-05-04 DIAGNOSIS — E785 Hyperlipidemia, unspecified: Secondary | ICD-10-CM | POA: Diagnosis not present

## 2018-05-04 DIAGNOSIS — I5020 Unspecified systolic (congestive) heart failure: Secondary | ICD-10-CM

## 2018-05-04 DIAGNOSIS — Z95810 Presence of automatic (implantable) cardiac defibrillator: Secondary | ICD-10-CM | POA: Insufficient documentation

## 2018-05-04 DIAGNOSIS — R590 Localized enlarged lymph nodes: Secondary | ICD-10-CM

## 2018-05-04 DIAGNOSIS — Z79899 Other long term (current) drug therapy: Secondary | ICD-10-CM | POA: Insufficient documentation

## 2018-05-04 DIAGNOSIS — E039 Hypothyroidism, unspecified: Secondary | ICD-10-CM | POA: Insufficient documentation

## 2018-05-04 DIAGNOSIS — I440 Atrioventricular block, first degree: Secondary | ICD-10-CM | POA: Insufficient documentation

## 2018-05-04 DIAGNOSIS — Z955 Presence of coronary angioplasty implant and graft: Secondary | ICD-10-CM | POA: Diagnosis not present

## 2018-05-04 LAB — COMPREHENSIVE METABOLIC PANEL
ALBUMIN: 4.5 g/dL (ref 3.5–5.0)
ALT: 19 U/L (ref 0–44)
ANION GAP: 12 (ref 5–15)
AST: 18 U/L (ref 15–41)
Alkaline Phosphatase: 76 U/L (ref 38–126)
BILIRUBIN TOTAL: 1 mg/dL (ref 0.3–1.2)
BUN: 9 mg/dL (ref 8–23)
CO2: 30 mmol/L (ref 22–32)
Calcium: 9.2 mg/dL (ref 8.9–10.3)
Chloride: 95 mmol/L — ABNORMAL LOW (ref 98–111)
Creatinine, Ser: 1.2 mg/dL (ref 0.61–1.24)
GFR calc non Af Amer: >60 mL/min (ref >60)
Glucose, Bld: 131 mg/dL — ABNORMAL HIGH (ref 70–99)
POTASSIUM: 3.3 mmol/L — AB (ref 3.5–5.1)
SODIUM: 137 mmol/L (ref 135–145)
TOTAL PROTEIN: 8.1 g/dL (ref 6.5–8.1)

## 2018-05-04 LAB — URINALYSIS, ROUTINE W REFLEX MICROSCOPIC
BACTERIA UA: NONE SEEN
Bilirubin Urine: NEGATIVE
Glucose, UA: NEGATIVE mg/dL
KETONES UR: NEGATIVE mg/dL
LEUKOCYTES UA: NEGATIVE
Nitrite: NEGATIVE
PROTEIN: 30 mg/dL — AB
pH: 5 (ref 5.0–8.0)

## 2018-05-04 LAB — PULMONARY FUNCTION TEST
DL/VA % PRED: 61 %
DL/VA: 2.82 ml/min/mmHg/L
DLCO UNC: 14.4 ml/min/mmHg
DLCO unc % pred: 44 %
FEF 25-75 Pre: 3.86 L/sec
FEF2575-%Pred-Pre: 139 %
FEV1-%Pred-Pre: 87 %
FEV1-Pre: 3.03 L
FEV1FVC-%Pred-Pre: 112 %
FEV6-%PRED-PRE: 81 %
FEV6-Pre: 3.57 L
FEV6FVC-%PRED-PRE: 104 %
FVC-%Pred-Pre: 77 %
PRE FEV6/FVC RATIO: 100 %
Pre FEV1/FVC ratio: 84 %
RV % pred: 103 %
RV: 2.41 L
TLC % PRED: 87 %
TLC: 6.14 L

## 2018-05-04 LAB — PSA: PROSTATIC SPECIFIC ANTIGEN: 0.24 ng/mL (ref 0.00–4.00)

## 2018-05-04 LAB — CBC WITH DIFFERENTIAL/PLATELET
ABS IMMATURE GRANULOCYTES: 0 10*3/uL (ref 0.0–0.1)
BASOS ABS: 0.1 10*3/uL (ref 0.0–0.1)
Basophils Relative: 1 %
EOS PCT: 3 %
Eosinophils Absolute: 0.3 10*3/uL (ref 0.0–0.7)
HEMATOCRIT: 40.8 % (ref 39.0–52.0)
Hemoglobin: 13.7 g/dL (ref 13.0–17.0)
IMMATURE GRANULOCYTES: 0 %
LYMPHS ABS: 2 10*3/uL (ref 0.7–4.0)
LYMPHS PCT: 21 %
MCH: 31.9 pg (ref 26.0–34.0)
MCHC: 33.6 g/dL (ref 30.0–36.0)
MCV: 94.9 fL (ref 78.0–100.0)
Monocytes Absolute: 1 10*3/uL (ref 0.1–1.0)
Monocytes Relative: 11 %
NEUTROS ABS: 6.2 10*3/uL (ref 1.7–7.7)
Neutrophils Relative %: 64 %
Platelets: 273 10*3/uL (ref 150–400)
RBC: 4.3 MIL/uL (ref 4.22–5.81)
RDW: 14.4 % (ref 11.5–15.5)
WBC: 9.7 10*3/uL (ref 4.0–10.5)

## 2018-05-04 LAB — MAGNESIUM: MAGNESIUM: 1.5 mg/dL — AB (ref 1.7–2.4)

## 2018-05-04 LAB — TSH: TSH: 10.531 u[IU]/mL — ABNORMAL HIGH (ref 0.350–4.500)

## 2018-05-04 LAB — TYPE AND SCREEN
ABO/RH(D): A POS
Antibody Screen: NEGATIVE

## 2018-05-04 LAB — URIC ACID: Uric Acid, Serum: 7.2 mg/dL (ref 3.7–8.6)

## 2018-05-04 LAB — LACTATE DEHYDROGENASE: LDH: 154 U/L (ref 98–192)

## 2018-05-04 LAB — PROTIME-INR
INR: 1.25
PROTHROMBIN TIME: 15.6 s — AB (ref 11.4–15.2)

## 2018-05-04 LAB — HEMOGLOBIN A1C
Hgb A1c MFr Bld: 6.1 % — ABNORMAL HIGH (ref 4.8–5.6)
MEAN PLASMA GLUCOSE: 128.37 mg/dL

## 2018-05-04 LAB — ANTITHROMBIN III: ANTITHROMB III FUNC: 100 % (ref 75–120)

## 2018-05-04 LAB — APTT: aPTT: 35 seconds (ref 24–36)

## 2018-05-04 LAB — LIPID PANEL
CHOL/HDL RATIO: 4.7 ratio
Cholesterol: 179 mg/dL (ref 0–200)
HDL: 38 mg/dL — AB (ref >40)
LDL Cholesterol: 99 mg/dL (ref 0–99)
TRIGLYCERIDES: 212 mg/dL — AB (ref <150)
VLDL: 42 mg/dL — ABNORMAL HIGH (ref 0–40)

## 2018-05-04 LAB — POCT I-STAT CREATININE: Creatinine, Ser: 1.1 mg/dL (ref 0.61–1.24)

## 2018-05-04 LAB — PREALBUMIN: Prealbumin: 22.3 mg/dL (ref 18–38)

## 2018-05-04 MED ORDER — IOPAMIDOL (ISOVUE-M 300) INJECTION 61%
15.0000 mL | Freq: Once | INTRAMUSCULAR | Status: AC | PRN
  Administered 2018-05-04: 100 mL via INTRATHECAL

## 2018-05-04 NOTE — Progress Notes (Addendum)
Advanced Heart Failure Clinic Note   Referring Physician: Dr. Orpah Greek, [facility] Primary Care: Dr. Clementeen Graham Primary Cardiologist: New to Dr. Haroldine Laws.   HPI: Stephen Cardenas is a 62 y.o. male with a past medical history of chronic systolic CHF (EF 32%) due to ischemic cardiomyopathy with a history of MI. AICD, COPD, OSA on CPAP, HTN, and hypothyroidism.   He has a history of multiple MI's. One in 2007 and one in 2014 that resulted in cardiogenic shock and a prolonged ICU hospitalization.   He was admitted to [facility] in June 2018 with hypoglycemia and discharged to Rehab. He was discharged from [facility] but quickly readmitted to [facility] with chest pain and SOB. He was told that he had a fluid on his lung and then transferred back to rehab and eventually discharged on 02/07/17. He presented to the ED at [facility] on 02/09/17 with SOB and weakness. Chest X ray with large pleural effusion, he was therefore transferred to [facility] where he underwent a left thoracentesis with transudative fluid.   He underwent repeat right and left heart cath on 02/14/17 that showed severe 3 vessel disease with multiple areas of in-stent restenosis. Right heart with Fick output/index 5.9/3.1. He underwent DES placement x 2 to LCx and mid - RCA. Had a 24-hour Thallium viability study with minimal to no viability in all segments except for the septum. Discharge weight was 158 pounds.   In 7/19 seen in HF Clinic with recurrent PAF and NYHA IIIB symptoms. Underwent DC-CV on 7/25. Remains in NSR on amio.   He presents today for regular follow up. Today had CT Chest/Abd/Pelvis, PFTs, and is planned for multiple vascular studies this afternoon for VAD/Transplant work up. He is feeling good overall. Remains in NSR by EKG. Starts cardiac rehab in [place] on 10/1. Denies orthopnea or PND. Taking all medications as directed. Denies bleeding on Eliquis. Denies CP, palpitations, fevers, chills, lightheadedness or  dizziness.   EKG 05/04/18 NSR with 1st degree AV block, 71 bpm, QRS 180 ms, PR interval 308 ms  CPX (04/06/18)  FVC 3.58 (79%)    FEV1 2.91 (84%)     FEV1/FVC 81 (105%)    MVV 128 (94%)  Resting HR: 67 Peak HR: 118  (76% age predicted max HR) BP rest: 94/58 BP peak: 102/60 Peak VO2: 14.8 (50% predicted peak VO2) VE/VCO2 slope: 36  Echo 6/19 EF 20% RV ok Personally reviewed  Review of systems complete and found to be negative unless listed in HPI.    Past Medical History:  Diagnosis Date  . AICD (automatic cardioverter/defibrillator) present 2014  . CAD (coronary artery disease)    S/p multiple prior MIs/PCIs // amdx in 2014 with acute stent thrombosis c/b CGS // Admx to [facility] in 10/5571 2/2 a/c systolic HF >> [facility] 09/17/00: mLAD 95 ISR, dLAD 30, D2 99; OM2 95 ISR; pRCA 30/50, mRCA 90/80, dRCA 30 EF 15 >> min to no viability on Thallium study (not surgical candidate) - PCI>> 3.5 x 20 mm Promus DES to the LCx 3 x 38 mm Promus DES to the proximal mid RCA  . Chronic systolic heart failure (HCC)    2/2 to ischemic CM - Echo 02/12/17:  Mild LVH, EF 15-20, mod to severe MR, severe LAE, mild RVE, mod reduced RVSF, mild RAE, mod TR, PASP 65  . COPD (chronic obstructive pulmonary disease) ([place])   . History of MI (myocardial infarction)   . History of pneumonia   . HTN (hypertension)   .  Hyperlipidemia 03/08/2017  . Hypothyroidism   . OSA (obstructive sleep apnea) 03/08/2017    Current Outpatient Medications  Medication Sig Dispense Refill  . ALPRAZolam (XANAX) 0.5 MG tablet Take 0.5-1 mg by mouth at bedtime.     Marland Kitchen amiodarone (PACERONE) 200 MG tablet Take 1 tablet (200 mg) twice daily for 2 weeks. On 03/23/18, decrease to 1 tablet (200 mg) ONCE daily. 42 tablet 3  . apixaban (ELIQUIS) 5 MG TABS tablet Take 1 tablet (5 mg total) by mouth 2 (two) times daily. 60 tablet 6  . aspirin EC 81 MG tablet Take 81 mg by mouth daily.    Marland Kitchen atorvastatin (LIPITOR) 20 MG tablet Take 20 mg by mouth at  bedtime.    . carvedilol (COREG) 3.125 MG tablet Take 1 tablet (3.125 mg total) by mouth 2 (two) times daily with a meal. 60 tablet 3  . Cholecalciferol (VITAMIN D3) 2000 units TABS Take 2,000 Units by mouth daily.    . digoxin (LANOXIN) 0.125 MG tablet Take 1 tablet (0.125 mg total) by mouth daily. 30 tablet 3  . DULoxetine (CYMBALTA) 30 MG capsule Take 90 mg by mouth at bedtime.     . ferrous sulfate 325 (65 FE) MG tablet Take 1 tablet (325 mg total) by mouth 2 (two) times daily with a meal. 60 tablet 3  . furosemide (LASIX) 40 MG tablet Take 1 Tablet on Mon, Wed, and Fridays.  May take extra tablet as needed for wt gain. 30 tablet 3  . Levothyroxine Sodium 175 MCG CAPS Take 175 mcg by mouth daily before breakfast.     . Multiple Vitamins-Minerals (MULTIVITAMIN PO) Take 1 tablet by mouth daily.    . pantoprazole (PROTONIX) 40 MG tablet Take 1 tablet (40 mg total) by mouth at bedtime. 30 tablet 6  . potassium chloride (K-DUR,KLOR-CON) 10 MEQ tablet Take 1 tablet (10 mEq total) by mouth as needed (take with lasix only). 15 tablet 3  . sacubitril-valsartan (ENTRESTO) 24-26 MG Take 1 tablet by mouth 2 (two) times daily. 60 tablet 6  . spironolactone (ALDACTONE) 25 MG tablet Take 1 tablet (25 mg total) by mouth daily. 30 tablet 3  . vitamin B-12 (CYANOCOBALAMIN) 1000 MCG tablet Take 2,000 mcg by mouth daily.     No current facility-administered medications for this encounter.     Allergies  Allergen Reactions  . Codeine Other (See Comments) and Nausea Only    Increased work of breathing, diaphoretic  . Mirtazapine     Other reaction(s): Other - See Comments      Social History   Socioeconomic History  . Marital status: Widowed    Spouse name: Not on file  . Number of children: Not on file  . Years of education: Not on file  . Highest education level: Not on file  Occupational History  . Not on file  Social Needs  . Financial resource strain: Not on file  . Food insecurity:     Worry: Not on file    Inability: Not on file  . Transportation needs:    Medical: Not on file    Non-medical: Not on file  Tobacco Use  . Smoking status: Former Smoker    Packs/day: 1.00    Years: 28.00    Pack years: 28.00    Types: Cigarettes    Start date: 1968    Last attempt to quit: 1996    Years since quitting: 23.7  . Smokeless tobacco: Never Used  Substance and Sexual  Activity  . Alcohol use: No  . Drug use: No  . Sexual activity: Not on file  Lifestyle  . Physical activity:    Days per week: Not on file    Minutes per session: Not on file  . Stress: Not on file  Relationships  . Social connections:    Talks on phone: Not on file    Gets together: Not on file    Attends religious service: Not on file    Active member of club or organization: Not on file    Attends meetings of clubs or organizations: Not on file    Relationship status: Not on file  . Intimate partner violence:    Fear of current or ex partner: Not on file    Emotionally abused: Not on file    Physically abused: Not on file    Forced sexual activity: Not on file  Other Topics Concern  . Not on file  Social History Narrative   Lives in [place]   Attends [employer]   Disabled carpenter      Family History  Problem Relation Age of Onset  . Diabetes Mother   . Heart disease Mother   . Stroke Mother   . Heart attack Mother   . Heart disease Father   . Heart attack Father   . Stroke Father   . COPD Sister   . Congestive Heart Failure Sister   . Hypertension Sister   . Diabetes Sister   . Hypertension Brother   . Diabetes Sister   . Hypertension Sister   . Diabetes Sister   . Hypertension Sister     There were no vitals filed for this visit.  Wt Readings from Last 3 Encounters:  04/21/18 86.6 kg (191 lb)  04/06/18 84 kg (185 lb 3.2 oz)  03/09/18 80.7 kg (178 lb)     PHYSICAL EXAM: General: Well appearing. No resp difficulty. HEENT: Normal Neck: Supple. JVP  5-6. Carotids 2+ bilat; no bruits. No thyromegaly or nodule noted. Cor: PMI nondisplaced. RRR, No M/G/R noted Lungs: CTAB, normal effort. Abdomen: Soft, non-tender, non-distended, no HSM. No bruits or masses. +BS  Extremities: No cyanosis, clubbing, or rash. R and LLE no edema.  Neuro: Alert & orientedx3, cranial nerves grossly intact. moves all 4 extremities w/o difficulty. Affect pleasant   ASSESSMENT & PLAN:  1.Chronic systolic CHF: ICM, EF 41-66% June 2018 s/p MDT ICD - Echo 6/19 reviewed personally. EF 20%. LV markedly dilated and spherical. RV ok  - Symptoms improved with restoration of NSR - NYHA II-III symptoms currently. - Volume status stable on exam.   - Continue lasix 40 mg M/W/F.  - Continue Entresto 24/26 bid - unable to tolerate higher doses due to hypotension. - Continue spiro (despite mild gynecomastia - he does not think [facility] will approve Inspra)  - Continue digoxin 0.125 mg  - Continue Coreg 3.125 mg BID.  - CPX test previously reviewed.  Indices borderline for advanced therapies but probably a little bit early - Long discussion with VAD coordinators about screening process for advanced therapies. We will send packet to [facility] for consideration of transplant but I suspect lung disease will be prohibitive.  - Following closely for VAD consideration. Full PFTs and imaging pending today. Blood type A-pos - Reinforced fluid restriction to < 2 L daily, sodium restriction to less than 2000 mg daily, and the importance of daily weights.   - Will send LVAD work up labs today.  2. CAD s/p previous MI - Cath 7/1 with severe 3 v CAD with multiple areas of ISR - s/p 2v PCI 02/22/17 by Dr. Burt Knack.  - Anterior wall is non-viable given previous LAD stent thrombosis. 24-hour Thallium viability study showed minimal or no viability in all segments except for septum - No s/s of ischemia.    - Off ASA and Brilinta. Continue Eliqiuis 5 bid. Continue Crestor.   3. PAF - CHADSVASC = 3  (CHF, HTN, CAD) - s/p  DC-CV 03/09/18. Maintaining NSR.  - Continue Eliquis 5 bid and amio 200 daily  4  Chronic hypoxic respiratory failure with large left pleural effusion - S/p thoracentesis in 6/18 - No recurrence.   5. COPD - Per PCP and pulmonary. No wheeze on exam.  - Repeat PFTs with DLCO done today. Results pending.  - Ambulated in clinic and O2 sat remained > 96% for the entirety of walking.   Doing well overall. Multiple imaging studies and PFTs today. Will also send work up labs.  RTC 6-8 weeks with MD.   Shirley Friar, PA-C 05/04/18   Greater than 50% of the 25 minute visit was spent in counseling/coordination of care regarding disease state education, salt/fluid restriction, sliding scale diuretics, and medication compliance.

## 2018-05-04 NOTE — Progress Notes (Signed)
c 

## 2018-05-04 NOTE — Addendum Note (Signed)
Encounter addended by: Shirley Friar, PA-C on: 05/04/2018 2:41 PM  Actions taken: Sign clinical note

## 2018-05-04 NOTE — Telephone Encounter (Signed)
Opened in error

## 2018-05-04 NOTE — Progress Notes (Signed)
Patient ambulated around clinic X's 2 laps.  Oxygen remained between 96-97% with HR 88-92.

## 2018-05-04 NOTE — Progress Notes (Signed)
Pre VAD workup  Preliminary  BUE and BLE venous duplex: negative for DVT. Carotid duplex: no significant ICA stenosis. ABI: WNL.   Landry Mellow, RDMS, RVT

## 2018-05-04 NOTE — Patient Instructions (Signed)
Labs today (will call for abnormal results, otherwise no news is good news)  Follow up as scheduled with Dr. Haroldine Laws.

## 2018-05-05 LAB — HEPATITIS B SURFACE ANTIGEN: HEP B S AG: NEGATIVE

## 2018-05-05 LAB — T4: T4 TOTAL: 10.6 ug/dL (ref 4.5–12.0)

## 2018-05-05 LAB — HIV ANTIBODY (ROUTINE TESTING W REFLEX): HIV Screen 4th Generation wRfx: NONREACTIVE

## 2018-05-05 LAB — HEPATITIS B CORE ANTIBODY, TOTAL: Hep B Core Total Ab: POSITIVE — AB

## 2018-05-05 LAB — HEPATITIS B SURFACE ANTIBODY,QUALITATIVE: HEP B S AB: REACTIVE

## 2018-05-05 LAB — HEPATITIS C ANTIBODY: HCV Ab: >11 s/co ratio — ABNORMAL HIGH (ref 0.0–0.9)

## 2018-05-06 LAB — DRVVT MIX: dRVVT Mix: 48.2 s — ABNORMAL HIGH (ref 0.0–47.0)

## 2018-05-06 LAB — LUPUS ANTICOAGULANT PANEL
DRVVT: 58 s — AB (ref 0.0–47.0)
PTT LA: 37.1 s (ref 0.0–51.9)

## 2018-05-06 LAB — DRVVT CONFIRM: dRVVT Confirm: 1.1 ratio (ref 0.8–1.2)

## 2018-05-10 LAB — FACTOR 5 LEIDEN

## 2018-05-17 ENCOUNTER — Telehealth (HOSPITAL_COMMUNITY): Payer: Self-pay | Admitting: Licensed Clinical Social Worker

## 2018-05-17 NOTE — Telephone Encounter (Signed)
CSW called pt to schedule appointment to complete CSW LVAD assessment.  Pt agreed to meet with CSW on 10/30 at 9am prior to appointment with Dr. Prescott Gum  CSW will continue to follow and assist as needed  Jorge Ny, [place] Social Worker [phone]

## 2018-05-18 ENCOUNTER — Telehealth (HOSPITAL_COMMUNITY): Payer: Self-pay | Admitting: Unknown Physician Specialty

## 2018-05-18 NOTE — Telephone Encounter (Signed)
Pt called x 2 and left messages regarding increased TSH level at VAD evaluation. I called to ask pt who is managing his thyroid medication. We would like to refer the pt to endo if appropriate.  Tanda Rockers RN, BSN VAD Coordinator 24/7 Pager [phone]

## 2018-05-19 ENCOUNTER — Telehealth (HOSPITAL_COMMUNITY): Payer: Self-pay | Admitting: Unknown Physician Specialty

## 2018-05-19 DIAGNOSIS — Z01818 Encounter for other preprocedural examination: Secondary | ICD-10-CM

## 2018-05-19 NOTE — Telephone Encounter (Signed)
Pt being referred to endo per Dr. Haroldine Laws.  Tanda Rockers RN, BSN VAD Coordinator 24/7 Pager [phone]

## 2018-05-22 ENCOUNTER — Ambulatory Visit (INDEPENDENT_AMBULATORY_CARE_PROVIDER_SITE_OTHER): Payer: Medicare Other

## 2018-05-22 DIAGNOSIS — I5022 Chronic systolic (congestive) heart failure: Secondary | ICD-10-CM

## 2018-05-22 DIAGNOSIS — Z95810 Presence of automatic (implantable) cardiac defibrillator: Secondary | ICD-10-CM

## 2018-05-22 NOTE — Progress Notes (Signed)
EPIC Encounter for ICM Monitoring  Patient Name: Stephen Cardenas is a 62 y.o. male Date: 05/22/2018 Primary Care Physican: [facility], [facility] Primary Care Physican: Patient, No Pcp Per Primary Cardiologist:Bensimhon Electrophysiologist:Allred Dry Weight:190lbs weighs daily         Heart Failure questions reviewed, pt reporting he gained ~8 lbs in the last couple of months but thought it could be related to fluid. He has been taking extra Furosemide for the last 2 days.  Advised the report suggests dryness after taking extra Furosemide and does not appear he has fluid at this time.  Patient said he is not sure if he wants to have the LVAD procedure but will follow up with Dr Lawson Fiscal this month.    Thoracic impedance normal but did suggesting fluid accumulation from 05/12/18 through 05/17/2018.  Prescribed: Furosemide40 mgtake 1 tabletdaily. Take extra 40 mg (1 tab) as needed.Potassium 10 mEqTake 1 tablet (10 mEq total) by mouth as needed (take with lasix only).  Labs: 11/28/2017 Creatinine 1.20, BUN 15, Potassium 3.9, Sodium 134, EGFR >60 08/01/2017 Creatinine 0.97, BUN 13, Potassium 4.2, Sodium 136, EGFR >60 05/18/2017 Creatinine1.16, BUN11, Potassium4.4, [id number], [id number] 04/29/2017 Creatinine1.00, BUN13, Potassium3.8, Sodium132, EGFR>60  03/17/2017 Creatinine0.87, BUN16, Potassium3.4, [id number], EGFR>60 03/08/2017 Creatinine0.78, BUN15, Potassium4.0, Sodium138, [id number]  02/23/2017 Creatinine0.80, BUN13, Potassium4.6, Sodium134, EGFR>60 A complete set of results can be found in Results Review.  Recommendations: No changes.  Encouraged to call for fluid symptoms.  Follow-up plan: ICM clinic phone appointment on 06/22/2018.   Office appointment scheduled 06/26/2018 with Dr. Haroldine Laws.   Appt with Dr Lawson Fiscal 10/30 to discuss LVAD option.   Copy of ICM check sent to Dr. Rayann Heman and Dr Haroldine Laws.   3 month ICM trend: 05/22/2018    1 Year  ICM trend:       [name], RN 05/22/2018 9:54 AM

## 2018-05-26 ENCOUNTER — Telehealth (HOSPITAL_COMMUNITY): Payer: Self-pay | Admitting: Vascular Surgery

## 2018-05-26 NOTE — Telephone Encounter (Signed)
Left pt message to move appt from 06/26/18 to 11/7 afternoon

## 2018-05-31 ENCOUNTER — Encounter (HOSPITAL_COMMUNITY): Payer: Self-pay | Admitting: *Deleted

## 2018-05-31 NOTE — Progress Notes (Signed)
Called Dr. Chrissie Noa office regarding consult to see Stephen Cardenas. Per referral coordinator, Stephen Cardenas medical records will be forwarded to Dr. Renne Crigler for review, and determination of acceptance of patient. Asked office to call VAD clinic and let us know when the patient will be scheduled.   Emerson Monte RN [place] Coordinator  Office: [phone]  24/7 Pager: [phone]

## 2018-06-09 ENCOUNTER — Other Ambulatory Visit (HOSPITAL_COMMUNITY): Payer: Self-pay | Admitting: Internal Medicine

## 2018-06-13 ENCOUNTER — Encounter: Payer: Self-pay | Admitting: Emergency Medicine

## 2018-06-13 ENCOUNTER — Ambulatory Visit (INDEPENDENT_AMBULATORY_CARE_PROVIDER_SITE_OTHER): Payer: Medicare Other | Admitting: Emergency Medicine

## 2018-06-13 ENCOUNTER — Telehealth (HOSPITAL_COMMUNITY): Payer: Self-pay | Admitting: Licensed Clinical Social Worker

## 2018-06-13 DIAGNOSIS — R911 Solitary pulmonary nodule: Secondary | ICD-10-CM | POA: Diagnosis not present

## 2018-06-13 DIAGNOSIS — I255 Ischemic cardiomyopathy: Secondary | ICD-10-CM

## 2018-06-13 DIAGNOSIS — J449 Chronic obstructive pulmonary disease, unspecified: Secondary | ICD-10-CM | POA: Diagnosis not present

## 2018-06-13 NOTE — Patient Instructions (Signed)
Your pulmonary function tests show only some evidence for mild COPD.  You can continue to keep your DuoNeb available in case she needed for shortness of breath.  You do not need to take it on a schedule. Your CT scan of the chest from 05/04/2018 shows that your left lower lobe scar is smaller compared with last year.  This is good news.  No need to perform repeat scanning. Follow with Dr Lamonte Sakai if needed

## 2018-06-13 NOTE — Progress Notes (Signed)
Subjective:    Patient ID: Stephen Cardenas, male    DOB: [birth year], 62 y.o.   MRN: [id number]  HPI 62 year old former smoker with coronary artery disease, ischemic cardiomyopathy with an AICD, hypothyroidism, hyperlipidemia. Also cares a history of COPD, has duoneb available for prn use. He underwent pulmonary function testing 02/18/17 that I personally reviewed. Spirometry was most consistent with restriction response. Lung volumes were restricted. Diffusion capacity decreased and did not correct when adjusted for alveolar volume. Difficult to quantify his degree of obstruction based on that study.  He was admitted in the hospital late June into July for volume overload, possible left-sided pneumonia with associated left effusion and mediastinal, paratracheal lymphadenopathy. A left thoracentesis showed mesothelial cells without evidence of malignancy. He was treated with antibiotics for possible pneumonia. He also underwent PTCI and treatment of severe in-stent restenosis of his left circumflex and RCA. Since hospitalization he feels well. He rarely needs DuoNeb. May have gained a little weight but no edema.   He underwent a follow-up CT scan of the chest 05/06/17 to follow mediastinal lymphadenopathy and left-sided infiltrate. I have reviewed the scan, it shows resolution of his left greater than right alveolar infiltrates, resolution of his left lower lobe collapse, significant improvement in his left effusion with only a small amount of residual fluid. There is now identified a 2.0 x 1.7 cm rounded area at the posterior left lower lobe with a possible area of cavitation versus air bronchogram. The mediastinal and right paratracheal lymphadenopathy was noted on his June CT scan are both decreased in size.  ROV 06/09/17 --patient has a history of former tobacco, coronary disease with an ischemic cardiomyopathy and AICD, COPD.  As above he was admitted for a left-sided pneumonia and intervention for his  coronary disease in July 2018.  A follow-up CT scan 05/06/17 showed some aches significant improvement but a 2.0 x 1.7 cm left lower lobe rounded opacity.  We performed a PET scan to better characterize this area, done on 06/02/17 and I have reviewed.  This shows no evidence for hypermetabolic activity in the left lower lobe nodular opacity that abuts the pleura.  The lesion has become smaller compared with 9/21, now measures 1.6 cm.   ROV 06/13/18 --this is a follow-up visit for patient with a history of tobacco use, COPD with minimal respiratory symptoms. He has DuoNeb available but does not require.  He also has a cardiomyopathy followed by the advanced heart failure team, being considered for possible ventricular assist device.  We have also been following a left lower lobe nodular opacity by CT scan and PET scan as noted above.  He had pulmonary function testing done on 05/04/2018 that I reviewed.  This showed mild mixed obstruction and restriction, normal lung volumes and a decreased diffusion capacity that does not fully correct when adjusted for alveolar volume.  A repeat CT scan of his chest was done on 05/04/2018 that I reviewed.  This shows that the area of left-sided opacity has further decreased in size consistent with scar.  Should not require any further follow-up.    Review of Systems  Constitutional: Positive for unexpected weight change. Negative for fever.  HENT: Positive for ear pain. Negative for congestion, dental problem, nosebleeds, postnasal drip, rhinorrhea, sinus pressure, sneezing, sore throat and trouble swallowing.   Eyes: Negative for redness and itching.  Respiratory: Positive for cough, chest tightness and shortness of breath. Negative for wheezing.   Cardiovascular: Negative for palpitations and leg swelling.  Gastrointestinal: Negative for nausea and vomiting.  Genitourinary: Negative for dysuria.  Musculoskeletal: Negative for joint swelling.  Skin: Negative for rash.    Allergic/Immunologic: Negative.  Negative for environmental allergies, food allergies and immunocompromised state.  Neurological: Positive for headaches.  Hematological: Bruises/bleeds easily.  Psychiatric/Behavioral: Positive for dysphoric mood. The patient is nervous/anxious.     Past Medical History:  Diagnosis Date  . AICD (automatic cardioverter/defibrillator) present 2014  . CAD (coronary artery disease)    S/p multiple prior MIs/PCIs // amdx in 2014 with acute stent thrombosis c/b CGS // Admx to [facility] in 10/9765 2/2 a/c systolic HF >> [facility] 10/17/17: mLAD 95 ISR, dLAD 30, D2 99; OM2 95 ISR; pRCA 30/50, mRCA 90/80, dRCA 30 EF 15 >> min to no viability on Thallium study (not surgical candidate) - PCI>> 3.5 x 20 mm Promus DES to the LCx 3 x 38 mm Promus DES to the proximal mid RCA  . Chronic systolic heart failure (HCC)    2/2 to ischemic CM - Echo 02/12/17:  Mild LVH, EF 15-20, mod to severe MR, severe LAE, mild RVE, mod reduced RVSF, mild RAE, mod TR, PASP 65  . COPD (chronic obstructive pulmonary disease) ([place])   . History of MI (myocardial infarction)   . History of pneumonia   . HTN (hypertension)   . Hyperlipidemia 03/08/2017  . Hypothyroidism   . OSA (obstructive sleep apnea) 03/08/2017     Family History  Problem Relation Age of Onset  . Diabetes Mother   . Heart disease Mother   . Stroke Mother   . Heart attack Mother   . Heart disease Father   . Heart attack Father   . Stroke Father   . COPD Sister   . Congestive Heart Failure Sister   . Hypertension Sister   . Diabetes Sister   . Hypertension Brother   . Diabetes Sister   . Hypertension Sister   . Diabetes Sister   . Hypertension Sister      Social History   Socioeconomic History  . Marital status: Widowed    Spouse name: Not on file  . Number of children: Not on file  . Years of education: Not on file  . Highest education level: Not on file  Occupational History  . Not on file  Social Needs  . Financial  resource strain: Not on file  . Food insecurity:    Worry: Not on file    Inability: Not on file  . Transportation needs:    Medical: Not on file    Non-medical: Not on file  Tobacco Use  . Smoking status: Former Smoker    Packs/day: 1.00    Years: 28.00    Pack years: 28.00    Types: Cigarettes    Start date: 1968    Last attempt to quit: 1996    Years since quitting: 23.8  . Smokeless tobacco: Never Used  Substance and Sexual Activity  . Alcohol use: No  . Drug use: No  . Sexual activity: Not on file  Lifestyle  . Physical activity:    Days per week: Not on file    Minutes per session: Not on file  . Stress: Not on file  Relationships  . Social connections:    Talks on phone: Not on file    Gets together: Not on file    Attends religious service: Not on file    Active member of club or organization: Not on file    Attends  meetings of clubs or organizations: Not on file    Relationship status: Not on file  . Intimate partner violence:    Fear of current or ex partner: Not on file    Emotionally abused: Not on file    Physically abused: Not on file    Forced sexual activity: Not on file  Other Topics Concern  . Not on file  Social History Narrative   Lives in [place]   Attends [employer]   Disabled carpenter     Allergies  Allergen Reactions  . Codeine Other (See Comments) and Nausea Only    Increased work of breathing, diaphoretic  . Mirtazapine     Other reaction(s): Other - See Comments     Outpatient Medications Prior to Visit  Medication Sig Dispense Refill  . ALPRAZolam (XANAX) 0.5 MG tablet Take 0.5-1 mg by mouth at bedtime.     Marland Kitchen amiodarone (PACERONE) 200 MG tablet Take 1 tablet (200 mg) twice daily for 2 weeks. On 03/23/18, decrease to 1 tablet (200 mg) ONCE daily. 42 tablet 3  . apixaban (ELIQUIS) 5 MG TABS tablet Take 1 tablet (5 mg total) by mouth 2 (two) times daily. 60 tablet 6  . aspirin EC 81 MG tablet Take 81 mg by mouth daily.     Marland Kitchen atorvastatin (LIPITOR) 20 MG tablet Take 20 mg by mouth at bedtime.    . carvedilol (COREG) 3.125 MG tablet Take 1 tablet (3.125 mg total) by mouth 2 (two) times daily with a meal. 60 tablet 3  . Cholecalciferol (VITAMIN D3) 2000 units TABS Take 2,000 Units by mouth daily.    . digoxin (LANOXIN) 0.125 MG tablet Take 1 tablet (0.125 mg total) by mouth daily. 30 tablet 3  . DULoxetine (CYMBALTA) 30 MG capsule Take 90 mg by mouth at bedtime.     . ferrous sulfate 325 (65 FE) MG tablet Take 1 tablet (325 mg total) by mouth 2 (two) times daily with a meal. 60 tablet 3  . furosemide (LASIX) 40 MG tablet Take 1 Tablet on Mon, Wed, and Fridays.  May take extra tablet as needed for wt gain. 30 tablet 3  . Levothyroxine Sodium 175 MCG CAPS Take 175 mcg by mouth daily before breakfast.     . Multiple Vitamins-Minerals (MULTIVITAMIN PO) Take 1 tablet by mouth daily.    . pantoprazole (PROTONIX) 40 MG tablet Take 1 tablet (40 mg total) by mouth at bedtime. 30 tablet 6  . potassium chloride (K-DUR,KLOR-CON) 10 MEQ tablet Take 1 tablet (10 mEq total) by mouth as needed (take with lasix only). 15 tablet 3  . sacubitril-valsartan (ENTRESTO) 24-26 MG Take 1 tablet by mouth 2 (two) times daily. 60 tablet 6  . spironolactone (ALDACTONE) 25 MG tablet Take 1 tablet (25 mg total) by mouth daily. 30 tablet 3  . vitamin B-12 (CYANOCOBALAMIN) 1000 MCG tablet Take 2,000 mcg by mouth daily.     No facility-administered medications prior to visit.          Objective:   Physical Exam Vitals:   06/13/18 1440  BP: 128/70  Pulse: 70  SpO2: 99%  Weight: 197 lb (89.4 kg)  Height: 5' 7.25" (1.708 m)   Gen: Pleasant, well-nourished, in no distress,  normal affect  ENT: No lesions,  mouth clear,  oropharynx clear, no postnasal drip  Neck: No JVD, no Stridor  Lungs: No use of accessory muscles, Clear bilaterally, no wheezing  Cardiovascular: RRR, distant, no murmur  Musculoskeletal: No deformities, no cyanosis  or clubbing  Neuro: alert, non focal  Skin: Warm, no lesions or rashes      Assessment & Plan:  COPD (chronic obstructive pulmonary disease) ([place]) I reviewed his pulmonary function testing, shows mild mixed obstruction and restriction.  I do not think he needs to be on scheduled bronchodilators based on his symptomatology.  He has DuoNeb's available in case he would not need them.  Pulmonary nodule, left Smaller on CT scan from 05/04/2018, consistent with scar after his left lower lobe pneumonia in October 2018.  He should not need any follow-up scans.  Baltazar Apo, MD, PhD 06/13/2018, 2:58 PM  Pulmonary and Critical Care [phone] or if no answer [phone]

## 2018-06-13 NOTE — Assessment & Plan Note (Signed)
I reviewed his pulmonary function testing, shows mild mixed obstruction and restriction.  I do not think he needs to be on scheduled bronchodilators based on his symptomatology.  He has DuoNeb's available in case he would not need them.

## 2018-06-13 NOTE — Telephone Encounter (Signed)
Patient was scheduled to come into clinic tomorrow morning to complete CSW LVAD assessment.  Patient reports that he is sure he does not want an LVAD and does not have any doubts about this at this time- agreeable to canceling appointment to speak with CSW further regarding LVAD since does not anticipate wanting one in the future.  Pt still plans to meet with Dr. Prescott Gum tomorrow and will discuss further with him- CSW can reschedule LVAD assessment if pt changes his mind  Jorge Ny, [place] Clinical Social Worker [facility] [phone]

## 2018-06-13 NOTE — Assessment & Plan Note (Signed)
Smaller on CT scan from 05/04/2018, consistent with scar after his left lower lobe pneumonia in October 2018.  He should not need any follow-up scans.

## 2018-06-14 ENCOUNTER — Other Ambulatory Visit: Payer: Self-pay

## 2018-06-14 ENCOUNTER — Other Ambulatory Visit (HOSPITAL_COMMUNITY): Payer: Medicare Other

## 2018-06-14 ENCOUNTER — Encounter: Payer: Self-pay | Admitting: Cardiothoracic Surgery

## 2018-06-14 ENCOUNTER — Institutional Professional Consult (permissible substitution) (INDEPENDENT_AMBULATORY_CARE_PROVIDER_SITE_OTHER): Payer: Medicare Other | Admitting: Cardiothoracic Surgery

## 2018-06-14 VITALS — BP 106/78 | HR 74 | Resp 16 | Ht 67.25 in | Wt 197.0 lb

## 2018-06-14 DIAGNOSIS — I255 Ischemic cardiomyopathy: Secondary | ICD-10-CM

## 2018-06-14 DIAGNOSIS — I2510 Atherosclerotic heart disease of native coronary artery without angina pectoris: Secondary | ICD-10-CM

## 2018-06-14 DIAGNOSIS — I252 Old myocardial infarction: Secondary | ICD-10-CM

## 2018-06-14 DIAGNOSIS — I5022 Chronic systolic (congestive) heart failure: Secondary | ICD-10-CM | POA: Diagnosis not present
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
## 2018-06-19 ENCOUNTER — Other Ambulatory Visit (HOSPITAL_COMMUNITY): Payer: Self-pay | Admitting: *Deleted

## 2018-06-19 DIAGNOSIS — Z01818 Encounter for other preprocedural examination: Secondary | ICD-10-CM

## 2018-06-19 DIAGNOSIS — Z1159 Encounter for screening for other viral diseases: Secondary | ICD-10-CM

## 2018-06-22 ENCOUNTER — Encounter (HOSPITAL_COMMUNITY): Payer: Self-pay | Admitting: Internal Medicine

## 2018-06-22 ENCOUNTER — Other Ambulatory Visit: Payer: Self-pay

## 2018-06-22 ENCOUNTER — Ambulatory Visit (INDEPENDENT_AMBULATORY_CARE_PROVIDER_SITE_OTHER): Payer: Medicare Other

## 2018-06-22 ENCOUNTER — Ambulatory Visit (HOSPITAL_COMMUNITY)
Admission: RE | Admit: 2018-06-22 | Discharge: 2018-06-22 | Disposition: A | Discharge status: Home / Self Care | Payer: Medicare Other | Source: Ambulatory Visit | Attending: Internal Medicine | Admitting: Internal Medicine

## 2018-06-22 ENCOUNTER — Telehealth: Payer: Self-pay

## 2018-06-22 VITALS — BP 105/71 | HR 89 | Wt 195.2 lb

## 2018-06-22 DIAGNOSIS — Z79890 Hormone replacement therapy (postmenopausal): Secondary | ICD-10-CM | POA: Insufficient documentation

## 2018-06-22 DIAGNOSIS — J90 Pleural effusion, not elsewhere classified: Secondary | ICD-10-CM | POA: Diagnosis not present

## 2018-06-22 DIAGNOSIS — Z888 Allergy status to other drugs, medicaments and biological substances status: Secondary | ICD-10-CM | POA: Insufficient documentation

## 2018-06-22 DIAGNOSIS — Z885 Allergy status to narcotic agent status: Secondary | ICD-10-CM | POA: Diagnosis not present

## 2018-06-22 DIAGNOSIS — G4733 Obstructive sleep apnea (adult) (pediatric): Secondary | ICD-10-CM | POA: Insufficient documentation

## 2018-06-22 DIAGNOSIS — I252 Old myocardial infarction: Secondary | ICD-10-CM | POA: Diagnosis not present

## 2018-06-22 DIAGNOSIS — Z8619 Personal history of other infectious and parasitic diseases: Secondary | ICD-10-CM | POA: Diagnosis not present

## 2018-06-22 DIAGNOSIS — I5022 Chronic systolic (congestive) heart failure: Secondary | ICD-10-CM | POA: Diagnosis not present

## 2018-06-22 DIAGNOSIS — I2510 Atherosclerotic heart disease of native coronary artery without angina pectoris: Secondary | ICD-10-CM | POA: Diagnosis not present

## 2018-06-22 DIAGNOSIS — J9611 Chronic respiratory failure with hypoxia: Secondary | ICD-10-CM | POA: Insufficient documentation

## 2018-06-22 DIAGNOSIS — B1920 Unspecified viral hepatitis C without hepatic coma: Secondary | ICD-10-CM | POA: Diagnosis not present

## 2018-06-22 DIAGNOSIS — E039 Hypothyroidism, unspecified: Secondary | ICD-10-CM | POA: Diagnosis not present

## 2018-06-22 DIAGNOSIS — Z955 Presence of coronary angioplasty implant and graft: Secondary | ICD-10-CM | POA: Insufficient documentation

## 2018-06-22 DIAGNOSIS — J449 Chronic obstructive pulmonary disease, unspecified: Secondary | ICD-10-CM | POA: Diagnosis not present

## 2018-06-22 DIAGNOSIS — Z95810 Presence of automatic (implantable) cardiac defibrillator: Secondary | ICD-10-CM | POA: Diagnosis not present

## 2018-06-22 DIAGNOSIS — Z8249 Family history of ischemic heart disease and other diseases of the circulatory system: Secondary | ICD-10-CM | POA: Insufficient documentation

## 2018-06-22 DIAGNOSIS — I480 Paroxysmal atrial fibrillation: Secondary | ICD-10-CM | POA: Diagnosis not present

## 2018-06-22 DIAGNOSIS — E785 Hyperlipidemia, unspecified: Secondary | ICD-10-CM | POA: Diagnosis not present

## 2018-06-22 DIAGNOSIS — Z7901 Long term (current) use of anticoagulants: Secondary | ICD-10-CM | POA: Diagnosis not present

## 2018-06-22 DIAGNOSIS — Z87891 Personal history of nicotine dependence: Secondary | ICD-10-CM | POA: Diagnosis not present

## 2018-06-22 DIAGNOSIS — I110 Hypertensive heart disease with heart failure: Secondary | ICD-10-CM | POA: Diagnosis not present

## 2018-06-22 DIAGNOSIS — Z833 Family history of diabetes mellitus: Secondary | ICD-10-CM | POA: Diagnosis not present

## 2018-06-22 DIAGNOSIS — Z7982 Long term (current) use of aspirin: Secondary | ICD-10-CM | POA: Insufficient documentation

## 2018-06-22 DIAGNOSIS — Z79899 Other long term (current) drug therapy: Secondary | ICD-10-CM | POA: Diagnosis not present

## 2018-06-22 DIAGNOSIS — I10 Essential (primary) hypertension: Secondary | ICD-10-CM | POA: Diagnosis not present

## 2018-06-22 LAB — DIGOXIN LEVEL: Digoxin Level: 1.2 ng/mL (ref 0.8–2.0)

## 2018-06-22 LAB — BASIC METABOLIC PANEL
Anion gap: 8 (ref 5–15)
BUN: 11 mg/dL (ref 8–23)
CALCIUM: 9.5 mg/dL (ref 8.9–10.3)
CO2: 28 mmol/L (ref 22–32)
Chloride: 99 mmol/L (ref 98–111)
Creatinine, Ser: 1.16 mg/dL (ref 0.61–1.24)
GFR calc Af Amer: >60 mL/min (ref >60)
GLUCOSE: 103 mg/dL — AB (ref 70–99)
Potassium: 4.1 mmol/L (ref 3.5–5.1)
Sodium: 135 mmol/L (ref 135–145)

## 2018-06-22 NOTE — Telephone Encounter (Signed)
LMOVM reminding pt to send remote transmission.   

## 2018-06-22 NOTE — Patient Instructions (Signed)
Labs drawn today.  Office will call you if results are abnormal.  Your physician recommends that you schedule a follow-up appointment in: 2-3 months

## 2018-06-22 NOTE — Progress Notes (Signed)
Advanced Heart Failure Clinic Note   Referring Physician: Dr. Orpah Greek, [facility] Primary Care: Dr. Clementeen Graham Primary Cardiologist: New to Dr. Haroldine Laws.   HPI: Stephen Cardenas is a 62 y.o. male with a past medical history of chronic systolic CHF (EF 35%) due to ischemic cardiomyopathy with a history of MI. AICD, COPD, OSA on CPAP, HTN, and hypothyroidism.   He has a history of multiple MI's. One in 2007 and one in 2014 that resulted in cardiogenic shock and a prolonged ICU hospitalization.   He was admitted to [facility] in June 2018 with hypoglycemia and discharged to Rehab. He was discharged from [facility] but quickly readmitted to [facility] with chest pain and SOB. He was told that he had a fluid on his lung and then transferred back to rehab and eventually discharged on 02/07/17. He presented to the ED at [facility] on 02/09/17 with SOB and weakness. Chest X ray with large pleural effusion, he was therefore transferred to [facility] where he underwent a left thoracentesis with transudative fluid.   He underwent repeat right and left heart cath on 02/14/17 that showed severe 3 vessel disease with multiple areas of in-stent restenosis. Right heart with Fick output/index 5.9/3.1. He underwent DES placement x 2 to LCx and mid - RCA. Had a 24-hour Thallium viability study with minimal to no viability in all segments except for the septum. Discharge weight was 158 pounds.   In 7/19 seen in HF Clinic with recurrent PAF and NYHA IIIB symptoms. Underwent DC-CV on 7/25. Remains in NSR on amio.   He presents today for follow up. Seen by Dr. Prescott Gum 06/14/18 and deemed appropriate candidate for VAD if pt wishes to proceed. Hepatitis C Antibody positive during work up labs. He has met a VAD patient now, and at this time he does not want to proceed with VAD implant. He does not like the idea of a big surgery or being attached to something constantly. Overall says he feels pretty good. Has mild SOB  with climbing a hill or moderate exertion. Denies orthopnea or PND. Has occasional chest pressure, but likens it to gas. Not like his previous angina. Denies lightheadedness or dizziness. He has not started Cardiac Rehab as he can't, Medicare would only cover 80% and he couldn't afford the rest of it. ($188 a month).   EKG 05/04/18 NSR with 1st degree AV block, 71 bpm, QRS 180 ms, PR interval 308 ms  CPX (04/06/18)  FVC 3.58 (79%)    FEV1 2.91 (84%)     FEV1/FVC 81 (105%)    MVV 128 (94%)  Resting HR: 67 Peak HR: 118  (76% age predicted max HR) BP rest: 94/58 BP peak: 102/60 Peak VO2: 14.8 (50% predicted peak VO2) VE/VCO2 slope: 36  Echo 6/19 EF 20% RV ok Personally reviewed  Review of systems complete and found to be negative unless listed in HPI.    Past Medical History:  Diagnosis Date  . AICD (automatic cardioverter/defibrillator) present 2014  . CAD (coronary artery disease)    S/p multiple prior MIs/PCIs // amdx in 2014 with acute stent thrombosis c/b CGS // Admx to [facility] in 12/9739 2/2 a/c systolic HF >> [facility] 01/17/83: mLAD 95 ISR, dLAD 30, D2 99; OM2 95 ISR; pRCA 30/50, mRCA 90/80, dRCA 30 EF 15 >> min to no viability on Thallium study (not surgical candidate) - PCI>> 3.5 x 20 mm Promus DES to the LCx 3 x 38 mm Promus DES to the proximal mid  RCA  . Chronic systolic heart failure ([place])    2/2 to ischemic CM - Echo 02/12/17:  Mild LVH, EF 15-20, mod to severe MR, severe LAE, mild RVE, mod reduced RVSF, mild RAE, mod TR, PASP 65  . COPD (chronic obstructive pulmonary disease) ([place])   . History of MI (myocardial infarction)   . History of pneumonia   . HTN (hypertension)   . Hyperlipidemia 03/08/2017  . Hypothyroidism   . OSA (obstructive sleep apnea) 03/08/2017    Current Outpatient Medications  Medication Sig Dispense Refill  . ALPRAZolam (XANAX) 0.5 MG tablet Take 0.5-1 mg by mouth at bedtime.     Marland Kitchen amiodarone (PACERONE) 200 MG tablet Take 1 tablet (200 mg) twice daily  for 2 weeks. On 03/23/18, decrease to 1 tablet (200 mg) ONCE daily. 42 tablet 3  . apixaban (ELIQUIS) 5 MG TABS tablet Take 1 tablet (5 mg total) by mouth 2 (two) times daily. 60 tablet 6  . aspirin EC 81 MG tablet Take 81 mg by mouth daily.    Marland Kitchen atorvastatin (LIPITOR) 20 MG tablet Take 20 mg by mouth at bedtime.    . carvedilol (COREG) 3.125 MG tablet Take 1 tablet (3.125 mg total) by mouth 2 (two) times daily with a meal. 60 tablet 3  . Cholecalciferol (VITAMIN D3) 2000 units TABS Take 2,000 Units by mouth daily.    . digoxin (LANOXIN) 0.125 MG tablet Take 1 tablet (0.125 mg total) by mouth daily. 30 tablet 3  . DULoxetine (CYMBALTA) 30 MG capsule Take 90 mg by mouth at bedtime.     . ferrous sulfate 325 (65 FE) MG tablet Take 1 tablet (325 mg total) by mouth 2 (two) times daily with a meal. 60 tablet 3  . furosemide (LASIX) 40 MG tablet Take 1 Tablet on Mon, Wed, and Fridays.  May take extra tablet as needed for wt gain. 30 tablet 3  . Levothyroxine Sodium 175 MCG CAPS Take 175 mcg by mouth daily before breakfast.     . Multiple Vitamins-Minerals (MULTIVITAMIN PO) Take 1 tablet by mouth daily.    . pantoprazole (PROTONIX) 40 MG tablet Take 1 tablet (40 mg total) by mouth at bedtime. 30 tablet 6  . potassium chloride (K-DUR,KLOR-CON) 10 MEQ tablet Take 1 tablet (10 mEq total) by mouth as needed (take with lasix only). 15 tablet 3  . sacubitril-valsartan (ENTRESTO) 24-26 MG Take 1 tablet by mouth 2 (two) times daily. 60 tablet 6  . spironolactone (ALDACTONE) 25 MG tablet Take 1 tablet (25 mg total) by mouth daily. 30 tablet 3  . vitamin B-12 (CYANOCOBALAMIN) 1000 MCG tablet Take 2,000 mcg by mouth daily.     No current facility-administered medications for this encounter.     Allergies  Allergen Reactions  . Codeine Other (See Comments) and Nausea Only    Increased work of breathing, diaphoretic  . Mirtazapine     Other reaction(s): Other - See Comments      Social History    Socioeconomic History  . Marital status: Widowed    Spouse name: Not on file  . Number of children: Not on file  . Years of education: Not on file  . Highest education level: Not on file  Occupational History  . Not on file  Social Needs  . Financial resource strain: Not on file  . Food insecurity:    Worry: Not on file    Inability: Not on file  . Transportation needs:    Medical: Not  on file    Non-medical: Not on file  Tobacco Use  . Smoking status: Former Smoker    Packs/day: 1.00    Years: 28.00    Pack years: 28.00    Types: Cigarettes    Start date: 1968    Last attempt to quit: 1996    Years since quitting: 23.8  . Smokeless tobacco: Never Used  Substance and Sexual Activity  . Alcohol use: No  . Drug use: No  . Sexual activity: Not on file  Lifestyle  . Physical activity:    Days per week: Not on file    Minutes per session: Not on file  . Stress: Not on file  Relationships  . Social connections:    Talks on phone: Not on file    Gets together: Not on file    Attends religious service: Not on file    Active member of club or organization: Not on file    Attends meetings of clubs or organizations: Not on file    Relationship status: Not on file  . Intimate partner violence:    Fear of current or ex partner: Not on file    Emotionally abused: Not on file    Physically abused: Not on file    Forced sexual activity: Not on file  Other Topics Concern  . Not on file  Social History Narrative   Lives in [place]   Attends [employer]   Disabled carpenter      Family History  Problem Relation Age of Onset  . Diabetes Mother   . Heart disease Mother   . Stroke Mother   . Heart attack Mother   . Heart disease Father   . Heart attack Father   . Stroke Father   . COPD Sister   . Congestive Heart Failure Sister   . Hypertension Sister   . Diabetes Sister   . Hypertension Brother   . Diabetes Sister   . Hypertension Sister   .  Diabetes Sister   . Hypertension Sister     Vitals:   06/22/18 1340  BP: 105/71  Pulse: 89  SpO2: 100%  Weight: 88.5 kg (195 lb 3.2 oz)    Wt Readings from Last 3 Encounters:  06/22/18 88.5 kg (195 lb 3.2 oz)  06/14/18 89.4 kg (197 lb)  06/13/18 89.4 kg (197 lb)     PHYSICAL EXAM: General: Well appearing. No resp difficulty. HEENT: Normal anicteric  Neck: Supple. JVP 5-6. Carotids 2+ bilat; no bruits. No thyromegaly or nodule noted. Cor: PMI nondisplaced. RRR, No M/G/R noted Lungs: CTAB with decreased BS throughout normal effort. Abdomen: Obese Soft, non-tender, non-distended, no HSM. No bruits or masses. +BS  Extremities: no cyanosis, clubbing, rash, edema Neuro: alert & oriented x 3, cranial nerves grossly intact. moves all 4 extremities w/o difficulty. Affect pleasant   ASSESSMENT & PLAN:  1.Chronic systolic CHF: ICM, EF 56-25% June 2018 s/p MDT ICD - Echo 6/19 reviewed personally. EF 20%. LV markedly dilated and spherical. RV ok  - Symptoms improved with restoration of NSR - NYHA II-III symptoms - Volume status stable on exam.    - Continue lasix 40 mg M/W/F.  - Continue Entresto 24/26 bid - unable to tolerate higher doses due to hypotension. - Continue spiro (despite mild gynecomastia - he does not think [facility] will approve Inspra)  - Continue digoxin 0.125 mg  - Continue Coreg 3.125 mg BID.  - CPX test previously reviewed.  Indices borderline  for advanced therapies but probably a little bit early - Continue with VAD work up. Packet sent to [facility] for consideration of transplant.  - Following closely for VAD consideration. Full PFTs and imaging pending today. Blood type A-pos - Reinforced fluid restriction to < 2 L daily, sodium restriction to less than 2000 mg daily, and the importance of daily weights.   - Will send LVAD work up labs today.    2. CAD s/p previous MI - Cath 7/1 with severe 3 v CAD with multiple areas of ISR - s/p 2v PCI 02/22/17 by Dr. Burt Knack.  -  Anterior wall is non-viable given previous LAD stent thrombosis. 24-hour Thallium viability study showed minimal or no viability in all segments except for septum - No s/s of ischemia.    - Off ASA and Brilinta. Continue Eliqiuis 5 mg BID. Denies bleeding - Continue Crestor.   3. PAF - CHADSVASC = 3 (CHF, HTN, CAD) - s/p  DC-CV 03/09/18. Maintaining NSR.   - Continue Eliquis 5 mg BID and amio 200 mg daily.   4  Chronic hypoxic respiratory failure with large left pleural effusion - S/p thoracentesis in 6/18 - No recurrence. Did not de-sat when walked around office 05/04/18.   5. COPD - Per PCP and pulmonary. No wheeze on exam.  - PFTs 05/04/18 FEV1 3.03 (87%), FVC 77%, TLC 6.14 (87%), DLCO 14.4 (44%) - Ambulated in clinic and O2 sat remained > 96% for the entirety of walking.   6. Hepatitis C - Hepatitis C Antibody positive during work up labs. He states he underwent therapy with Harvoni in 2015 through the [place].   - No further testing warranted at this time with Harvoni. Pt states he can bring his certificate that says he is "cured" if needed.   Labs today. RTC 2-3 months. Sooner with symptoms.   Shirley Friar, PA-C 06/22/18   Patient seen and examined with the above-signed Advanced Practice Provider and/or Housestaff. I personally reviewed laboratory data, imaging studies and relevant notes. I independently examined the patient and formulated the important aspects of the plan. I have edited the note to reflect any of my changes or salient points. I have personally discussed the plan with the patient and/or family.  Overall stable NYHA III HF. We discussed recent testing and fact that he would likely benefit from LVAD but he has met with a VAD patient and also been seen by Dr. Prescott Gum but ahs decided he does not want to proceed at this point as he does not want to be "hooked up to batteries." He is not transplant candidate due to comorbidities. Volume status ok. BP too low to  titrate meds. Will continue to follow in HF Clinic. Maintaining NSR. No anginal sx.   Glori Bickers, MD  6:08 PM

## 2018-06-26 ENCOUNTER — Encounter (HOSPITAL_COMMUNITY): Payer: Medicare Other | Admitting: Internal Medicine

## 2018-06-27 ENCOUNTER — Telehealth: Payer: Self-pay

## 2018-06-27 NOTE — Progress Notes (Signed)
EPIC Encounter for ICM Monitoring  Patient Name: Stephen Cardenas is a 62 y.o. male Date: 06/27/2018 Primary Care Physican: [facility] Primary Cardiologist:Bensimhon Electrophysiologist:Allred Last Weight:190lbs weighs daily         Attempted call to patient and unable to reach.  Left detailed message, per DPR, regarding transmission.  Transmission reviewed.    Thoracic impedance normal.   Prescribed: Furosemide40 mgtake 1 tabletdaily. Take extra 40 mg (1 tab) as needed.Potassium 10 mEqTake 1 tablet (10 mEq total) by mouth as needed (take with lasix only).  Labs: 11/28/2017 Creatinine 1.20, BUN 15, Potassium 3.9, Sodium 134, EGFR >60 08/01/2017 Creatinine 0.97, BUN 13, Potassium 4.2, Sodium 136, EGFR >60 05/18/2017 Creatinine1.16, BUN11, Potassium4.4, [id number], [id number] 04/29/2017 Creatinine1.00, BUN13, Potassium3.8, Sodium132, EGFR>60  03/17/2017 Creatinine0.87, BUN16, Potassium3.4, POLIDC301, EGFR>60 03/08/2017 Creatinine0.78, BUN15, Potassium4.0, Sodium138, [id number]  02/23/2017 Creatinine0.80, BUN13, Potassium4.6, Sodium134, EGFR>60 A complete set of results can be found in Results Review.  Recommendations: Left voice mail with ICM number and encouraged to call if experiencing any fluid symptoms.  Follow-up plan: ICM clinic phone appointment on 07/27/2018.    Copy of ICM check sent to Dr. Rayann Heman.   3 month ICM trend: 06/24/2018    1 Year ICM trend:       [name], RN 06/27/2018 5:59 PM

## 2018-06-27 NOTE — Telephone Encounter (Signed)
Remote ICM transmission received.  Attempted call to patient regarding ICM remote transmission and left detailed message, per DPR, with next ICM remote transmission date of 07/27/2018.  Advised to return call for any fluid symptoms or questions.   

## 2018-06-29 ENCOUNTER — Encounter: Payer: Self-pay | Admitting: Internal Medicine

## 2018-07-07 ENCOUNTER — Telehealth: Payer: Self-pay

## 2018-07-07 NOTE — Telephone Encounter (Signed)
Patient called stating he feels bloated and weight remains at 190 lbs. He is unsure if he is developing fluid accumulation.  Transmission sent and received. Optivol thoracic impedance is currently at baseline suggesting normal.  Impedance has a pattern decreasing below baseline for a few days - a week and returns to baseline.  He takes Furosemide M,W,F weekly.  Advised to continue to monitor weight call back if any weight increase or other symptoms develop.    Optivol

## 2018-07-11 ENCOUNTER — Ambulatory Visit (INDEPENDENT_AMBULATORY_CARE_PROVIDER_SITE_OTHER): Payer: Medicare Other

## 2018-07-11 DIAGNOSIS — I5022 Chronic systolic (congestive) heart failure: Secondary | ICD-10-CM | POA: Diagnosis not present

## 2018-07-11 DIAGNOSIS — I255 Ischemic cardiomyopathy: Secondary | ICD-10-CM

## 2018-07-11 NOTE — Progress Notes (Signed)
Remote ICD transmission.   

## 2018-07-24 ENCOUNTER — Other Ambulatory Visit (HOSPITAL_COMMUNITY): Payer: Self-pay | Admitting: Internal Medicine

## 2018-07-27 ENCOUNTER — Ambulatory Visit (INDEPENDENT_AMBULATORY_CARE_PROVIDER_SITE_OTHER): Payer: Medicare Other

## 2018-07-27 DIAGNOSIS — Z95810 Presence of automatic (implantable) cardiac defibrillator: Secondary | ICD-10-CM | POA: Diagnosis not present

## 2018-07-27 DIAGNOSIS — I5022 Chronic systolic (congestive) heart failure: Secondary | ICD-10-CM | POA: Diagnosis not present

## 2018-07-28 ENCOUNTER — Ambulatory Visit: Payer: Medicare Other | Admitting: Internal Medicine

## 2018-07-28 NOTE — Progress Notes (Signed)
EPIC Encounter for ICM Monitoring  Patient Name: Stephen Cardenas is a 62 y.o. male Date: 07/28/2018 Primary Care Physican: [facility] Primary Cardiologist:Bensimhon Electrophysiologist:Allred Last Weight:190lbs weighs daily                                                        Transmission reviewed.    Thoracic impedance normal.   Prescribed: Furosemide40 mgtake 1 tabletdaily. Take extra 40 mg (1 tab) as needed.Potassium 10 mEqTake 1 tablet (10 mEq total) by mouth as needed (take with lasix only).  Labs: 11/28/2017 Creatinine 1.20, BUN 15, Potassium 3.9, Sodium 134, EGFR >60 08/01/2017 Creatinine 0.97, BUN 13, Potassium 4.2, Sodium 136, EGFR >60 05/18/2017 Creatinine1.16, BUN11, Potassium4.4, Sodium142, [id number] 04/29/2017 Creatinine1.00, BUN13, Potassium3.8, Sodium132, EGFR>60  03/17/2017 Creatinine0.87, BUN16, Potassium3.4, Sodium137, EGFR>60 03/08/2017 Creatinine0.78, BUN15, Potassium4.0, Sodium138, [id number]  02/23/2017 Creatinine0.80, BUN13, Potassium4.6, Sodium134, EGFR>60 A complete set of results can be found in Results Review.  Recommendations:  None  Follow-up plan: ICM clinic phone appointment on 09/04/2018  Copy of ICM check sent to Dr. Allred.   3 month ICM trend: 07/27/2018    1 Year ICM trend:       Laurie S Short, RN 07/28/2018 4:30 PM   

## 2018-07-28 NOTE — Progress Notes (Deleted)
Patient ID: Stephen Cardenas, male   DOB: 1956-02-11, 62 y.o.   MRN: [id number]    HPI  Stephen Cardenas is a 62 y.o.-year-old male, referred by his PCP, Dr.***, for management of hypothyroidism.  Pt. has been dx with hypothyroidism in *** >> on Levothyroxine 175 mcg (Tirosint).  A recent TSH level returned high and he was referred to endocrinology for further management of his hypothyroidism.  Of note, he is on amiodarone, most recently 200 mg daily.  He takes the thyroid hormone: - fasting - with water - separated by >30 min from b'fast  - no calcium, iron, PPIs, multivitamins   I reviewed pt's thyroid tests: Lab Results  Component Value Date   TSH 10.531 (H) 05/04/2018   Antithyroid antibodies: No results found for: THGAB No components found for: TPOAB  Pt describes: - weight gain - fatigue - cold intolerance - depression - constipation - dry skin - hair loss  Pt denies feeling nodules in neck, hoarseness, dysphagia/odynophagia, SOB with lying down.  She has + FH of thyroid disorders in: ***. No FH of thyroid cancer.  No h/o radiation tx to head or neck. No recent use of iodine supplements.  Pt. also has a history of ***.  ROS: Constitutional: no weight gain/loss, no fatigue, no subjective hyperthermia/hypothermia Eyes: no blurry vision, no xerophthalmia ENT: no sore throat, no nodules palpated in throat, no dysphagia/odynophagia, no hoarseness Cardiovascular: no CP/SOB/palpitations/leg swelling Respiratory: no cough/SOB Gastrointestinal: no N/V/D/C Musculoskeletal: no muscle/joint aches Skin: no rashes Neurological: no tremors/numbness/tingling/dizziness Psychiatric: no depression/anxiety  PE: There were no vitals taken for this visit. Wt Readings from Last 3 Encounters:  06/22/18 195 lb 3.2 oz (88.5 kg)  06/14/18 197 lb (89.4 kg)  06/13/18 197 lb (89.4 kg)   Constitutional: overweight, in NAD Eyes: PERRLA, EOMI, no exophthalmos ENT: moist mucous  membranes, no thyromegaly, no cervical lymphadenopathy Cardiovascular: RRR, No MRG Respiratory: CTA B Gastrointestinal: abdomen soft, NT, ND, BS+ Musculoskeletal: no deformities, strength intact in all 4 Skin: moist, warm, no rashes Neurological: no tremor with outstretched hands, DTR normal in all 4  ASSESSMENT: 1. Hypothyroidism  PLAN:  1. Patient with long-standing hypothyroidism, on levothyroxine therapy.  - he appears euthyroid.  - he does not appear to have a goiter, thyroid nodules, or neck compression symptoms - We discussed about correct intake of levothyroxine, fasting, with water, separated by at least 30 minutes from breakfast, and separated by more than 4 hours from calcium, iron, multivitamins, acid reflux medications (PPIs). - will check thyroid tests today: TSH, free T4 and will also add antithyroid antibodies to screen for Hashimoto's thyroiditis. - If labs today are abnormal, he will need to return in ~6 weeks for repeat labs - Otherwise, I will see him back in 4 months  Patient instructions: For now, please continue the current dose of levothyroxine, 175 mcg daily, pending the new results.  Take the thyroid hormone every day, with water, at least 30 minutes before breakfast, separated by at least 4 hours from: - acid reflux medications - calcium - iron - multivitamins  Please come back for a follow-up appointment in 6 months, however, we may need to have you back sooner, for labs.   Philemon Kingdom, MD PhD [facility] Endocr[facility]

## 2018-07-31 ENCOUNTER — Ambulatory Visit (INDEPENDENT_AMBULATORY_CARE_PROVIDER_SITE_OTHER): Payer: Medicare Other | Admitting: Internal Medicine

## 2018-07-31 ENCOUNTER — Encounter: Payer: Self-pay | Admitting: Internal Medicine

## 2018-07-31 VITALS — BP 110/78 | HR 82 | Ht 67.25 in | Wt 198.0 lb

## 2018-07-31 DIAGNOSIS — E039 Hypothyroidism, unspecified: Secondary | ICD-10-CM

## 2018-07-31 DIAGNOSIS — I255 Ischemic cardiomyopathy: Secondary | ICD-10-CM

## 2018-07-31 MED ORDER — LEVOTHYROXINE SODIUM 150 MCG PO TABS: 150.0000 ug | ORAL_TABLET | Freq: Every day | ORAL | 3 refills | Status: DC

## 2018-07-31 NOTE — Progress Notes (Signed)
Patient ID: Stephen Cardenas, male   DOB: 1955/08/22, 62 y.o.   MRN: [id number]    HPI  Stephen Cardenas is a 62 y.o.-year-old male, referred by his PCP, Dr. Haroldine Laws, for management of hypothyroidism.  Previously, his hypothyroidism was managed at the [place].  She has a h/o CAD (s/p 3x AMIs), now CHF >> will need to have a LVAD.  Pt. has been dx with hypothyroidism in 2000 >> on Levothyroxine 175 mcg (last dose change: 01/2018 at the [facility]).  A recent TSH level returned high and he was referred to endocrinology for further management of his hypothyroidism.  Of note, he is on amiodarone, dose most recently changed to 200 mg daily.  She takes the thyroid hormone: - fasting - with water - separated by >30 min from b'fast  - + calcium, multivitamins, iron, PPIs - all together with LT4!  I reviewed pt's thyroid tests -TSH was high 3 months ago: Lab Results  Component Value Date   TSH 10.531 (H) 05/04/2018   Antithyroid antibodies: No results found for: THGAB No components found for: TPOAB  Pt describes: - + Weight gain and loss - + fatigue - + depression - No cold intolerance - No constipation  Pt denies feeling nodules in neck, hoarseness, dysphagia/odynophagia, SOB with lying down.  She has + FH of thyroid disorders - sister with hypothyroidism. No FH of thyroid cancer.  No h/o radiation tx to head or neck. No recent use of iodine supplements.  Pt. also has a history of OSA, COPD, HTN, HL.  ROS: Constitutional: + weight gain and loss, + fatigue, + hot flushes after he eats, + poor sleep, + nocturia Eyes: + blurry vision, no xerophthalmia ENT: no sore throat, + see HPI + tinnitus, + hypoacusis Cardiovascular: + CP/+ SOB/+ palpitations/+ leg swelling Respiratory: + Cough/+ SOB/+ wheezing Gastrointestinal: No N/no V/no D/no C, + heartburn Musculoskeletal: + muscle aches/+ joint aches Skin: no rashes, no easy bruising, no hair loss Neurological: no  tremors/numbness/tingling/dizziness, no HA Psych: + both: anxiety/depression  Past Medical History:  Diagnosis Date  . AICD (automatic cardioverter/defibrillator) present 2014  . CAD (coronary artery disease)    S/p multiple prior MIs/PCIs // amdx in 2014 with acute stent thrombosis c/b CGS // Admx to [facility] in 10/5359 2/2 a/c systolic HF >> [facility] 11/17/29: mLAD 95 ISR, dLAD 30, D2 99; OM2 95 ISR; pRCA 30/50, mRCA 90/80, dRCA 30 EF 15 >> min to no viability on Thallium study (not surgical candidate) - PCI>> 3.5 x 20 mm Promus DES to the LCx 3 x 38 mm Promus DES to the proximal mid RCA  . Chronic systolic heart failure (HCC)    2/2 to ischemic CM - Echo 02/12/17:  Mild LVH, EF 15-20, mod to severe MR, severe LAE, mild RVE, mod reduced RVSF, mild RAE, mod TR, PASP 65  . COPD (chronic obstructive pulmonary disease) ([place])   . History of MI (myocardial infarction)   . History of pneumonia   . HTN (hypertension)   . Hyperlipidemia 03/08/2017  . Hypothyroidism   . OSA (obstructive sleep apnea) 03/08/2017   Past Surgical History:  Procedure Laterality Date  . CARDIAC DEFIBRILLATOR PLACEMENT  2014   Medtronic Every XT DR ICD implanted at the [facility] for primary prevention  . CARDIOVERSION N/A 03/09/2018   Procedure: CARDIOVERSION;  Surgeon: Jolaine Artist, MD;  Location: [facility] ENDOSCOPY;  Service: Cardiovascular;  Laterality: N/A;  . CORONARY STENT INTERVENTION N/A 02/22/2017   Procedure: Coronary Stent Intervention;  Surgeon: Sherren Mocha, MD;  Location: [place] CV LAB;  Service: Cardiovascular;  Laterality: N/A;  CFX and RCA  . FACIAL RECONSTRUCTION SURGERY Right 1984  . FEMUR FRACTURE SURGERY Right 1984  . RIGHT/LEFT HEART CATH AND CORONARY ANGIOGRAPHY N/A 02/14/2017   Procedure: Right/Left Heart Cath and Coronary Angiography;  Surgeon: Jolaine Artist, MD;  Location: [place] CV LAB;  Service: Cardiovascular;  Laterality: N/A;  . WRIST FRACTURE SURGERY Right 2015   Social History    Socioeconomic History  . Marital status: Widowed    Spouse name: Not on file  . Number of children: Not on file  . Years of education: Not on file  . Highest education level: Not on file  Occupational History  . Not on file  Social Needs  . Financial resource strain: Not on file  . Food insecurity:    Worry: Not on file    Inability: Not on file  . Transportation needs:    Medical: Not on file    Non-medical: Not on file  Tobacco Use  . Smoking status: Former Smoker    Packs/day: 1.00    Years: 28.00    Pack years: 28.00    Types: Cigarettes    Start date: 1968    Last attempt to quit: 1996    Years since quitting: 23.9  . Smokeless tobacco: Never Used  Substance and Sexual Activity  . Alcohol use: No  . Drug use: No  . Sexual activity: Not on file  Lifestyle  . Physical activity:    Days per week: Not on file    Minutes per session: Not on file  . Stress: Not on file  Relationships  . Social connections:    Talks on phone: Not on file    Gets together: Not on file    Attends religious service: Not on file    Active member of club or organization: Not on file    Attends meetings of clubs or organizations: Not on file    Relationship status: Not on file  . Intimate partner violence:    Fear of current or ex partner: Not on file    Emotionally abused: Not on file    Physically abused: Not on file    Forced sexual activity: Not on file  Other Topics Concern  . Not on file  Social History Narrative   Lives in [place]   Attends [employer]   Disabled carpenter   Current Outpatient Medications on File Prior to Visit  Medication Sig Dispense Refill  . ALPRAZolam (XANAX) 0.5 MG tablet Take 0.5-1 mg by mouth at bedtime.     Marland Kitchen amiodarone (PACERONE) 200 MG tablet Take 1 tablet (200 mg) twice daily for 2 weeks. On 03/23/18, decrease to 1 tablet (200 mg) ONCE daily. 42 tablet 3  . apixaban (ELIQUIS) 5 MG TABS tablet Take 1 tablet (5 mg total) by mouth 2  (two) times daily. 60 tablet 6  . aspirin EC 81 MG tablet Take 81 mg by mouth daily.    Marland Kitchen atorvastatin (LIPITOR) 20 MG tablet Take 20 mg by mouth at bedtime.    . carvedilol (COREG) 3.125 MG tablet Take 1 tablet (3.125 mg total) by mouth 2 (two) times daily with a meal. 60 tablet 3  . Cholecalciferol (VITAMIN D3) 2000 units TABS Take 2,000 Units by mouth daily.    . digoxin (LANOXIN) 0.125 MG tablet Take 1 tablet (0.125 mg total) by mouth daily. 30 tablet  3  . DULoxetine (CYMBALTA) 30 MG capsule Take 90 mg by mouth at bedtime.     . Ferrous Sulfate (IRON) 325 (65 Fe) MG TABS TAKE 1 TABLET BY MOUTH TWICE DAILY WITH A MEAL 60 tablet 3  . furosemide (LASIX) 40 MG tablet Take 1 Tablet on Mon, Wed, and Fridays.  May take extra tablet as needed for wt gain. 30 tablet 3  . Multiple Vitamins-Minerals (MULTIVITAMIN PO) Take 1 tablet by mouth daily.    . pantoprazole (PROTONIX) 40 MG tablet Take 1 tablet (40 mg total) by mouth at bedtime. 30 tablet 6  . potassium chloride (K-DUR,KLOR-CON) 10 MEQ tablet Take 1 tablet (10 mEq total) by mouth as needed (take with lasix only). 15 tablet 3  . sacubitril-valsartan (ENTRESTO) 24-26 MG Take 1 tablet by mouth 2 (two) times daily. 60 tablet 6  . spironolactone (ALDACTONE) 25 MG tablet Take 1 tablet (25 mg total) by mouth daily. 30 tablet 3  . vitamin B-12 (CYANOCOBALAMIN) 1000 MCG tablet Take 2,000 mcg by mouth daily.     No current facility-administered medications on file prior to visit.    Allergies  Allergen Reactions  . Codeine Other (See Comments) and Nausea Only    Increased work of breathing, diaphoretic  . Mirtazapine     Other reaction(s): Other - See Comments   Family History  Problem Relation Age of Onset  . Diabetes Mother   . Heart disease Mother   . Stroke Mother   . Heart attack Mother   . Heart disease Father   . Heart attack Father   . Stroke Father   . COPD Sister   . Congestive Heart Failure Sister   . Hypertension Sister   .  Diabetes Sister   . Hypertension Brother   . Diabetes Sister   . Hypertension Sister   . Diabetes Sister   . Hypertension Sister     PE: BP 110/78   Pulse 82   Ht 5' 7.25" (1.708 m)   Wt 198 lb (89.8 kg)   SpO2 98%   BMI 30.78 kg/m  Wt Readings from Last 3 Encounters:  07/31/18 198 lb (89.8 kg)  06/22/18 195 lb 3.2 oz (88.5 kg)  06/14/18 197 lb (89.4 kg)   Constitutional: overweight, in NAD Eyes: PERRLA, EOMI, no exophthalmos ENT: moist mucous membranes, no thyromegaly, no cervical lymphadenopathy Cardiovascular: RRR, No MRG Respiratory: CTA B Gastrointestinal: abdomen soft, NT, ND, BS+ Musculoskeletal: no deformities, strength intact in all 4 Skin: moist, warm, no rashes Neurological: no tremor with outstretched hands, DTR normal in all 4  ASSESSMENT: 1. Hypothyroidism  PLAN:  1. Patient with longstanding hypothyroidism, on levothyroxine therapy.  He is on a relatively high dose of levothyroxine, however, he is not taking this correctly.  He is taking it with calcium, iron, multivitamins, and PPIs.  We discussed that, to be absorbed fully, he needs to take levothyroxine on an empty stomach and to separate the other medicines from it.  I suspect that his high TSH was related to not absorbing the levothyroxine correctly. -He appears euthyroid, but does have some symptoms that correlate with hypothyroidism: Fatigue, depression, weight fluctuations -He does not appear to have a goiter, thyroid nodules or neck compression symptoms -We discussed about correct intake of levothyroxine, fasting, with water, separated by at least 30 minutes from breakfast, separated by more than 4 hours from calcium, iron, multivitamins, and acid reflux medications.  I advised him to move all his supplements to lunchtime or  later.  He can continue to eat breakfast at least 30 minutes after he takes the levothyroxine. -Since he will start to absorb the entire dose of levothyroxine, I will advise him to  decrease the levothyroxine dose from 175 mcg daily to 150 mcg daily. -will have him back in 5 to 6 weeks to check his TFTs - I will see him back in 4 months  Orders Placed This Encounter  Procedures  . TSH  . T4, free  . T3, free    Philemon Kingdom, MD PhD [facility] Endocrinology

## 2018-07-31 NOTE — Patient Instructions (Addendum)
Decrease the dose of levothyroxine.  Take the thyroid hormone every day, with water, at least 30 minutes before breakfast, separated by at least 4 hours from: - acid reflux medications - calcium - iron - multivitamins  Please come back for labs in 5-6 weeks.  Please come back for a follow-up appointment in 4 months.

## 2018-08-04 ENCOUNTER — Other Ambulatory Visit: Payer: Self-pay | Admitting: Internal Medicine

## 2018-08-24 ENCOUNTER — Encounter (HOSPITAL_COMMUNITY): Payer: Medicare Other | Admitting: Internal Medicine

## 2018-08-29 ENCOUNTER — Encounter (HOSPITAL_COMMUNITY): Payer: Medicare Other | Admitting: Internal Medicine

## 2018-08-31 ENCOUNTER — Ambulatory Visit (HOSPITAL_COMMUNITY)
Admission: RE | Admit: 2018-08-31 | Discharge: 2018-08-31 | Disposition: A | Discharge status: Home / Self Care | Payer: Medicare Other | Source: Ambulatory Visit | Attending: Internal Medicine | Admitting: Internal Medicine

## 2018-08-31 ENCOUNTER — Encounter (HOSPITAL_COMMUNITY): Payer: Self-pay | Admitting: Internal Medicine

## 2018-08-31 VITALS — BP 124/88 | HR 80 | Wt 197.0 lb

## 2018-08-31 DIAGNOSIS — J449 Chronic obstructive pulmonary disease, unspecified: Secondary | ICD-10-CM | POA: Diagnosis not present

## 2018-08-31 DIAGNOSIS — I110 Hypertensive heart disease with heart failure: Secondary | ICD-10-CM | POA: Insufficient documentation

## 2018-08-31 DIAGNOSIS — Z79890 Hormone replacement therapy (postmenopausal): Secondary | ICD-10-CM | POA: Insufficient documentation

## 2018-08-31 DIAGNOSIS — Z955 Presence of coronary angioplasty implant and graft: Secondary | ICD-10-CM | POA: Diagnosis not present

## 2018-08-31 DIAGNOSIS — I5022 Chronic systolic (congestive) heart failure: Secondary | ICD-10-CM | POA: Diagnosis not present

## 2018-08-31 DIAGNOSIS — I480 Paroxysmal atrial fibrillation: Secondary | ICD-10-CM | POA: Insufficient documentation

## 2018-08-31 DIAGNOSIS — J9611 Chronic respiratory failure with hypoxia: Secondary | ICD-10-CM | POA: Diagnosis not present

## 2018-08-31 DIAGNOSIS — I252 Old myocardial infarction: Secondary | ICD-10-CM | POA: Diagnosis not present

## 2018-08-31 DIAGNOSIS — I255 Ischemic cardiomyopathy: Secondary | ICD-10-CM | POA: Insufficient documentation

## 2018-08-31 DIAGNOSIS — I2510 Atherosclerotic heart disease of native coronary artery without angina pectoris: Secondary | ICD-10-CM | POA: Insufficient documentation

## 2018-08-31 DIAGNOSIS — Z79899 Other long term (current) drug therapy: Secondary | ICD-10-CM | POA: Diagnosis not present

## 2018-08-31 DIAGNOSIS — Z95810 Presence of automatic (implantable) cardiac defibrillator: Secondary | ICD-10-CM | POA: Diagnosis not present

## 2018-08-31 DIAGNOSIS — Z87891 Personal history of nicotine dependence: Secondary | ICD-10-CM | POA: Diagnosis not present

## 2018-08-31 DIAGNOSIS — E039 Hypothyroidism, unspecified: Secondary | ICD-10-CM | POA: Diagnosis not present

## 2018-08-31 DIAGNOSIS — E785 Hyperlipidemia, unspecified: Secondary | ICD-10-CM | POA: Diagnosis not present

## 2018-08-31 DIAGNOSIS — Z8249 Family history of ischemic heart disease and other diseases of the circulatory system: Secondary | ICD-10-CM | POA: Diagnosis not present

## 2018-08-31 DIAGNOSIS — N62 Hypertrophy of breast: Secondary | ICD-10-CM | POA: Insufficient documentation

## 2018-08-31 DIAGNOSIS — Z7982 Long term (current) use of aspirin: Secondary | ICD-10-CM | POA: Diagnosis not present

## 2018-08-31 DIAGNOSIS — G4733 Obstructive sleep apnea (adult) (pediatric): Secondary | ICD-10-CM | POA: Insufficient documentation

## 2018-08-31 DIAGNOSIS — Z7901 Long term (current) use of anticoagulants: Secondary | ICD-10-CM | POA: Insufficient documentation

## 2018-08-31 DIAGNOSIS — Z885 Allergy status to narcotic agent status: Secondary | ICD-10-CM | POA: Insufficient documentation

## 2018-08-31 DIAGNOSIS — Z8619 Personal history of other infectious and parasitic diseases: Secondary | ICD-10-CM | POA: Insufficient documentation

## 2018-08-31 LAB — BASIC METABOLIC PANEL
Anion gap: 10 (ref 5–15)
BUN: 14 mg/dL (ref 8–23)
CO2: 28 mmol/L (ref 22–32)
Calcium: 8.9 mg/dL (ref 8.9–10.3)
Chloride: 100 mmol/L (ref 98–111)
Creatinine, Ser: 1.34 mg/dL — ABNORMAL HIGH (ref 0.61–1.24)
GFR calc Af Amer: >60 mL/min (ref >60)
GFR calc non Af Amer: 56 mL/min — ABNORMAL LOW (ref >60)
GLUCOSE: 122 mg/dL — AB (ref 70–99)
POTASSIUM: 4.1 mmol/L (ref 3.5–5.1)
Sodium: 138 mmol/L (ref 135–145)

## 2018-08-31 LAB — BRAIN NATRIURETIC PEPTIDE: B Natriuretic Peptide: 841.1 pg/mL — ABNORMAL HIGH (ref 0.0–100.0)

## 2018-08-31 LAB — DIGOXIN LEVEL: Digoxin Level: 0.5 ng/mL — ABNORMAL LOW (ref 0.8–2.0)

## 2018-08-31 MED ORDER — FUROSEMIDE 40 MG PO TABS: 40.0000 mg | ORAL_TABLET | ORAL | 0 refills | Status: DC | PRN

## 2018-08-31 MED ORDER — SACUBITRIL-VALSARTAN 49-51 MG PO TABS: 1.0000 | ORAL_TABLET | Freq: Two times a day (BID) | ORAL | 6 refills | Status: AC

## 2018-08-31 NOTE — Patient Instructions (Signed)
Labs done today. We will call you for any abnormal labs  STOP Furosemide. You may take 40mg  (1 tab) as needed for swelling.  INCREASE Entresto 49-51mg  (1 tab) twice daily  Your physician has requested that you have an echocardiogram. Echocardiography is a painless test that uses sound waves to create images of your heart. It provides your doctor with information about the size and shape of your heart and how well your heart's chambers and valves are working. This procedure takes approximately one hour. There are no restrictions for this procedure.  Follow up with Dr. Haroldine Laws in 4 months

## 2018-08-31 NOTE — Progress Notes (Signed)
Advanced Heart Failure Clinic Note   Referring Physician: Dr. Orpah Greek, [facility] Primary Care: Dr. Clementeen Graham Primary Cardiologist: New to Dr. Haroldine Laws.   HPI: Stephen Cardenas is a 63 y.o. male with a past medical history of chronic systolic CHF (EF 93%) due to ischemic cardiomyopathy with a history of MI. AICD, COPD, OSA on CPAP, HTN, and hypothyroidism.   He has a history of multiple MI's. One in 2007 and one in 2014 that resulted in cardiogenic shock and a prolonged ICU hospitalization.   He was admitted to [facility] in June 2018 with hypoglycemia and discharged to Rehab. He was discharged from [facility] but quickly readmitted to [facility] with chest pain and SOB. He was told that he had a fluid on his lung and then transferred back to rehab and eventually discharged on 02/07/17. He presented to the ED at [facility] on 02/09/17 with SOB and weakness. Chest X ray with large pleural effusion, he was therefore transferred to [facility] where he underwent a left thoracentesis with transudative fluid.   He underwent repeat right and left heart cath on 02/14/17 that showed severe 3 vessel disease with multiple areas of in-stent restenosis. Right heart with Fick output/index 5.9/3.1. He underwent DES placement x 2 to LCx and mid - RCA. Had a 24-hour Thallium viability study with minimal to no viability in all segments except for the septum. Discharge weight was 158 pounds.   In 7/19 seen in HF Clinic with recurrent PAF and NYHA IIIB symptoms. Underwent DC-CV on 7/25. Remains in NSR on amio.   Seen by Dr. Prescott Gum 06/14/18 and deemed appropriate candidate for VAD if pt wishes to proceed.He has met a VAD patient and does not want to proceed with VAD implant. He does not like the idea of a big surgery or being attached to something constantly.   Here for routine f/u. Overall says he is doing fine. Can do all ADLs without too much problem but will get SOB if walking up hill or steps. No CP. No  edema, orthopnea or PND. Saw Dr. Monna Fam recently and meds adjusted. Could not afford CR. Taking lasix 40 M/W/F. Gets dizzy occasionally if stands up too fast.   CPX (04/06/18)  FVC 3.58 (79%)    FEV1 2.91 (84%)     FEV1/FVC 81 (105%)    MVV 128 (94%)  Resting HR: 67 Peak HR: 118  (76% age predicted max HR) BP rest: 94/58 BP peak: 102/60 Peak VO2: 14.8 (50% predicted peak VO2) VE/VCO2 slope: 36  Echo 6/19 EF 20% RV ok Personally reviewed  Review of systems complete and found to be negative unless listed in HPI.    Past Medical History:  Diagnosis Date  . AICD (automatic cardioverter/defibrillator) present 2014  . CAD (coronary artery disease)    S/p multiple prior MIs/PCIs // amdx in 2014 with acute stent thrombosis c/b CGS // Admx to [facility] in 09/3555 2/2 a/c systolic HF >> [facility] 10/16/18: mLAD 95 ISR, dLAD 30, D2 99; OM2 95 ISR; pRCA 30/50, mRCA 90/80, dRCA 30 EF 15 >> min to no viability on Thallium study (not surgical candidate) - PCI>> 3.5 x 20 mm Promus DES to the LCx 3 x 38 mm Promus DES to the proximal mid RCA  . Chronic systolic heart failure (HCC)    2/2 to ischemic CM - Echo 02/12/17:  Mild LVH, EF 15-20, mod to severe MR, severe LAE, mild RVE, mod reduced RVSF, mild RAE, mod TR, PASP 65  .  COPD (chronic obstructive pulmonary disease) ([place])   . History of MI (myocardial infarction)   . History of pneumonia   . HTN (hypertension)   . Hyperlipidemia 03/08/2017  . Hypothyroidism   . OSA (obstructive sleep apnea) 03/08/2017    Current Outpatient Medications  Medication Sig Dispense Refill  . ALPRAZolam (XANAX) 0.5 MG tablet Take 0.5-1 mg by mouth at bedtime.     Marland Kitchen amiodarone (PACERONE) 200 MG tablet Take 1 tablet (200 mg) twice daily for 2 weeks. On 03/23/18, decrease to 1 tablet (200 mg) ONCE daily. 42 tablet 3  . apixaban (ELIQUIS) 5 MG TABS tablet Take 1 tablet (5 mg total) by mouth 2 (two) times daily. 60 tablet 6  . aspirin EC 81 MG tablet Take 81 mg by mouth daily.      Marland Kitchen atorvastatin (LIPITOR) 20 MG tablet Take 20 mg by mouth at bedtime.    . carvedilol (COREG) 3.125 MG tablet Take 1 tablet (3.125 mg total) by mouth 2 (two) times daily with a meal. 60 tablet 3  . Cholecalciferol (VITAMIN D3) 2000 units TABS Take 2,000 Units by mouth daily.    . digoxin (LANOXIN) 0.125 MG tablet Take 1 tablet (0.125 mg total) by mouth daily. 30 tablet 3  . DULoxetine (CYMBALTA) 30 MG capsule Take 90 mg by mouth at bedtime.     . Ferrous Sulfate (IRON) 325 (65 Fe) MG TABS TAKE 1 TABLET BY MOUTH TWICE DAILY WITH A MEAL 60 tablet 3  . furosemide (LASIX) 40 MG tablet Take 1 Tablet on Mon, Wed, and Fridays.  May take extra tablet as needed for wt gain. 30 tablet 3  . levothyroxine (SYNTHROID, LEVOTHROID) 150 MCG tablet Take 1 tablet (150 mcg total) by mouth daily. 45 tablet 3  . Multiple Vitamins-Minerals (MULTIVITAMIN PO) Take 1 tablet by mouth daily.    . pantoprazole (PROTONIX) 40 MG tablet Take 1 tablet (40 mg total) by mouth at bedtime. 30 tablet 6  . potassium chloride (K-DUR,KLOR-CON) 10 MEQ tablet Take 1 tablet (10 mEq total) by mouth as needed (take with lasix only). 15 tablet 3  . sacubitril-valsartan (ENTRESTO) 24-26 MG Take 1 tablet by mouth 2 (two) times daily. 60 tablet 6  . spironolactone (ALDACTONE) 25 MG tablet Take 1 tablet (25 mg total) by mouth daily. 30 tablet 3  . vitamin B-12 (CYANOCOBALAMIN) 1000 MCG tablet Take 2,000 mcg by mouth daily.     No current facility-administered medications for this encounter.     Allergies  Allergen Reactions  . Codeine Other (See Comments) and Nausea Only    Increased work of breathing, diaphoretic  . Mirtazapine     Other reaction(s): Other - See Comments      Social History   Socioeconomic History  . Marital status: Widowed    Spouse name: Not on file  . Number of children: Not on file  . Years of education: Not on file  . Highest education level: Not on file  Occupational History  . Not on file  Social Needs   . Financial resource strain: Not on file  . Food insecurity:    Worry: Not on file    Inability: Not on file  . Transportation needs:    Medical: Not on file    Non-medical: Not on file  Tobacco Use  . Smoking status: Former Smoker    Packs/day: 1.00    Years: 28.00    Pack years: 28.00    Types: Cigarettes    Start  date: 1968    Last attempt to quit: 1996    Years since quitting: 24.0  . Smokeless tobacco: Never Used  Substance and Sexual Activity  . Alcohol use: No  . Drug use: No  . Sexual activity: Not on file  Lifestyle  . Physical activity:    Days per week: Not on file    Minutes per session: Not on file  . Stress: Not on file  Relationships  . Social connections:    Talks on phone: Not on file    Gets together: Not on file    Attends religious service: Not on file    Active member of club or organization: Not on file    Attends meetings of clubs or organizations: Not on file    Relationship status: Not on file  . Intimate partner violence:    Fear of current or ex partner: Not on file    Emotionally abused: Not on file    Physically abused: Not on file    Forced sexual activity: Not on file  Other Topics Concern  . Not on file  Social History Narrative   Lives in [place]   Attends [employer]   Disabled carpenter      Family History  Problem Relation Age of Onset  . Diabetes Mother   . Heart disease Mother   . Stroke Mother   . Heart attack Mother   . Heart disease Father   . Heart attack Father   . Stroke Father   . COPD Sister   . Congestive Heart Failure Sister   . Hypertension Sister   . Diabetes Sister   . Hypertension Brother   . Diabetes Sister   . Hypertension Sister   . Diabetes Sister   . Hypertension Sister     Vitals:   08/31/18 1057  BP: 124/88  Pulse: 80  SpO2: 98%  Weight: 89.4 kg (197 lb)    Wt Readings from Last 3 Encounters:  08/31/18 89.4 kg (197 lb)  07/31/18 89.8 kg (198 lb)  06/22/18 88.5 kg  (195 lb 3.2 oz)     PHYSICAL EXAM: General:  Well appearing. No resp difficulty HEENT: normal Neck: supple. no JVD. Carotids 2+ bilat; no bruits. No lymphadenopathy or thryomegaly appreciated. Cor: PMI nondisplaced. Regular rate & rhythm. No rubs, gallops or murmurs. Lungs: clear with decreased breath sounds Abdomen: soft, nontender, nondistended. No hepatosplenomegaly. No bruits or masses. Good bowel sounds. Extremities: no cyanosis, clubbing, rash, edema Neuro: alert & orientedx3, cranial nerves grossly intact. moves all 4 extremities w/o difficulty. Affect pleasant    ASSESSMENT & PLAN:  1.Chronic systolic CHF: ICM, EF 46-96% June 2018 s/p MDT ICD - Echo 6/19 reviewed personally. EF 20%. LV markedly dilated and spherical. RV ok  - Symptoms improved with restoration of NSR - Stable NYHA II-early III symptoms - Volume status stable on exam.    - Stop lasix. Increase Entresto to 49/51. Watch closely as previously unable to tolerate higher doses due to hypotension. - Continue spiro (despite mild gynecomastia - he does not think [facility] will approve Inspra)  - Continue digoxin 0.125 mg  - Continue Coreg 3.125 mg BID.  - Refuses VAD - Check labs today. Get echo in 4 months at return visit  2. CAD s/p previous MI - Cath 7/1 with severe 3 v CAD with multiple areas of ISR - s/p 2v PCI 02/22/17 by Dr. Burt Knack.  - Anterior wall is non-viable given previous LAD stent  thrombosis. 24-hour Thallium viability study showed minimal or no viability in all segments except for septum - No s/s of ischemia - Off ASA and Brilinta. Continue Eliqiuis 5 mg BID. Denies bleeding - Continue Crestor.   3. PAF - CHADSVASC = 3 (CHF, HTN, CAD) - s/p  DC-CV 03/09/18.  - maintaining NSR  - Continue Eliquis 5 mg BID and amio 200 mg daily.   4  Chronic hypoxic respiratory failure with large left pleural effusion - S/p thoracentesis in 6/18 - No recurrence. Did not de-sat when walked around office 05/04/18.    5. COPD - Per PCP and pulmonary. No wheeze on exam.  - PFTs 05/04/18 FEV1 3.03 (87%), FVC 77%, TLC 6.14 (87%), DLCO 14.4 (44%) - Ambulated in clinic and O2 sat remained > 96% for the entirety of walking.   6. Hepatitis C - Hepatitis C Antibody positive during work up labs. He states he underwent therapy with Harvoni in 2015 through the [place].   - No further testing warranted at this time with Harvoni. Pt states he can bring his certificate that says he is "cured" if needed.     Glori Bickers, MD 08/31/18   Patient seen and examined with the above-signed Advanced Practice Provider and/or Housestaff. I personally reviewed laboratory data, imaging studies and relevant notes. I independently examined the patient and formulated the important aspects of the plan. I have edited the note to reflect any of my changes or salient points. I have personally discussed the plan with the patient and/or family.  Overall stable NYHA III HF. We discussed recent testing and fact that he would likely benefit from LVAD but he has met with a VAD patient and also been seen by Dr. Prescott Gum but ahs decided he does not want to proceed at this point as he does not want to be "hooked up to batteries." He is not transplant candidate due to comorbidities. Volume status ok. BP too low to titrate meds. Will continue to follow in HF Clinic. Maintaining NSR. No anginal sx.   Glori Bickers, MD  11:32 AM

## 2018-08-31 NOTE — Addendum Note (Signed)
Encounter addended by: Marlise Eves, RN on: 08/31/2018 11:50 AM  Actions taken: Charge Capture section accepted, Clinical Note Signed, Order list changed, Diagnosis association updated

## 2018-09-01 LAB — CUP PACEART REMOTE DEVICE CHECK
Battery Remaining Longevity: 68 mo
Battery Voltage: 2.98 V
Brady Statistic AP VP Percent: 0.19 %
Brady Statistic AS VP Percent: 2.83 %
Brady Statistic AS VS Percent: 77.9 %
Brady Statistic RA Percent Paced: 19.17 %
Brady Statistic RV Percent Paced: 3.1 %
Date Time Interrogation Session: 20191126083425
HighPow Impedance: 64 Ohm
Implantable Lead Implant Date: 20140813
Implantable Lead Implant Date: 20140813
Implantable Lead Location: 753859
Implantable Lead Location: 753860
Implantable Lead Model: 5076
Implantable Pulse Generator Implant Date: 20140813
Lead Channel Impedance Value: 361 Ohm
Lead Channel Impedance Value: 361 Ohm
Lead Channel Pacing Threshold Amplitude: 0.75 V
Lead Channel Pacing Threshold Pulse Width: 0.4 ms
Lead Channel Pacing Threshold Pulse Width: 0.4 ms
Lead Channel Sensing Intrinsic Amplitude: 17.25 mV
Lead Channel Sensing Intrinsic Amplitude: 17.25 mV
Lead Channel Sensing Intrinsic Amplitude: 4.875 mV
Lead Channel Sensing Intrinsic Amplitude: 4.875 mV
Lead Channel Setting Pacing Amplitude: 1.5 V
Lead Channel Setting Sensing Sensitivity: 0.3 mV
MDC IDC MSMT LEADCHNL RV IMPEDANCE VALUE: 285 Ohm
MDC IDC MSMT LEADCHNL RV PACING THRESHOLD AMPLITUDE: 1 V
MDC IDC SET LEADCHNL RV PACING AMPLITUDE: 2 V
MDC IDC SET LEADCHNL RV PACING PULSEWIDTH: 0.4 ms
MDC IDC STAT BRADY AP VS PERCENT: 19.08 %

## 2018-09-04 ENCOUNTER — Other Ambulatory Visit (HOSPITAL_COMMUNITY): Payer: Self-pay

## 2018-09-04 ENCOUNTER — Telehealth: Payer: Self-pay

## 2018-09-04 ENCOUNTER — Ambulatory Visit (INDEPENDENT_AMBULATORY_CARE_PROVIDER_SITE_OTHER): Payer: Medicare Other

## 2018-09-04 DIAGNOSIS — I5022 Chronic systolic (congestive) heart failure: Secondary | ICD-10-CM | POA: Diagnosis not present

## 2018-09-04 DIAGNOSIS — Z95810 Presence of automatic (implantable) cardiac defibrillator: Secondary | ICD-10-CM

## 2018-09-04 MED ORDER — FUROSEMIDE 40 MG PO TABS: 40.0000 mg | ORAL_TABLET | ORAL | 0 refills | Status: DC | PRN

## 2018-09-04 NOTE — Telephone Encounter (Signed)
Remote ICM transmission received.  Attempted call to patient regarding ICM remote transmission and left detailed message, per DPR, to return call.    

## 2018-09-04 NOTE — Progress Notes (Signed)
EPIC Encounter for ICM Monitoring  Patient Name: Stephen Cardenas is a 63 y.o. male Date: 09/04/2018 Primary Care Physican: [facility], [facility] Primary Cardiologist:Bensimhon Electrophysiologist:Allred LastWeight:190lbs weighs daily Today's Weight:  unknown       Attempted call to patient.  Left message to return call regarding transmission. Transmission reviewed.   Report: Thoracic impedance abnormal suggesting fluid accumulation starting 08/28/2018. Furosemide changed to PRN at 08/31/2018 office visit with Dr Haroldine Laws  Prescribed: Furosemide 40 mg Take 1 tablet (40 mg total) by mouth as needed. Take 1 Tablet on Mon, Wed, and Fridays. May take extra tablet as needed for wt gain.  Labs:  08/31/2018 Creatinine 1.34, BUN 14, Potassium 4.1, Sodium 138, eGFR 56->60  07/02/2018 Creatinine 1.16, BUN 11, Potassium 4.1, Sodium 135, eGFR >60 05/04/2018 Creatinine 1.20, BUN 9,   Potassium 3.3, Sodium 137, eGFR >60  03/09/2018 Creatinine 1.22, BUN 10, Potassium 4.2, Sodium 139, eGFR >60  02/01/2018 Creatinine 1.14, BUN 11, Potassium 4.2, Sodium 140, eGFR >60  11/28/2017 Creatinine 1.20, BUN 15, Potassium 3.9, Sodium 134, EGFR >60 A complete set of results can be found in Results Review.  Recommendations: Unable to reach.   Will advised patient to take PRN Furosemide if reached.  Follow-up plan: ICM clinic phone appointment on 09/12/2018 (manual send) to recheck fluid levels.       Copy of ICM check sent to Dr. Rayann Heman and Dr Haroldine Laws.   3 month ICM trend: 09/04/2018    1 Year ICM trend:       [name], RN 09/04/2018 1:13 PM

## 2018-09-14 ENCOUNTER — Other Ambulatory Visit: Payer: Medicare Other

## 2018-09-18 NOTE — Progress Notes (Signed)
No ICM remote transmission received for 09/12/2018 and next ICM transmission scheduled for 10/09/2018.

## 2018-09-19 ENCOUNTER — Telehealth: Payer: Self-pay

## 2018-09-19 NOTE — Telephone Encounter (Signed)
Attempted return call to patient as requested by voice mail message.  Left message was returning his call.

## 2018-09-20 NOTE — Telephone Encounter (Signed)
Patient called to give update regarding he is retaining fluid.  SYMPTOMS: Patient reports he started retaining fluid after Furosemide was changed to PRN when he started Entresto at last HF clinic visit 08/31/2018.  Symptoms include abdominal swelling, shortness of breath and swelling in one of his arms.    He started taking the PRN 40 mg Furosemide yesterday and plans on taking it for several days in a row to see if symptoms will resolve.  He is at his sister's house and unable to send remote transmission to recheck fluid levels since. The last transmission 09/04/2018 suggests fluid accumulation.   RECOMMENDATIONS: Advised to follow low salt diet and if condition worsens to use ER.  He will send a remote transmission on 2/7 or 2/10 when he returns home.    Advised I would let Dr Haroldine Laws know that he is taking PRN Furosemide for several consecutive days and if he has any recommendations then I will call him back.

## 2018-09-20 NOTE — Telephone Encounter (Signed)
Attempted to return patient call.  Left message to call back if needed.

## 2018-09-20 NOTE — Telephone Encounter (Signed)
Thanks. Please resume lasix 40mg  daily

## 2018-09-21 ENCOUNTER — Other Ambulatory Visit: Payer: Medicare Other

## 2018-09-21 MED ORDER — FUROSEMIDE 40 MG PO TABS: ORAL_TABLET | ORAL | Status: DC

## 2018-09-21 NOTE — Telephone Encounter (Signed)
I left a detailed message on the patient's voice mail (ok per DPR) of Dr. Clayborne Dana recommendations to resume lasix 40 mg once daily. I asked that the patient please transmit when he returns home as previously discussed with Sharman Cheek, RN on 2/5.  I advised that he could call me back today with any further questions, or if needed, Margarita Grizzle will be back in the office tomorrow.

## 2018-09-25 ENCOUNTER — Telehealth: Payer: Self-pay

## 2018-09-25 ENCOUNTER — Ambulatory Visit (INDEPENDENT_AMBULATORY_CARE_PROVIDER_SITE_OTHER): Payer: Medicare Other

## 2018-09-25 DIAGNOSIS — I5022 Chronic systolic (congestive) heart failure: Secondary | ICD-10-CM

## 2018-09-25 DIAGNOSIS — Z95810 Presence of automatic (implantable) cardiac defibrillator: Secondary | ICD-10-CM

## 2018-09-25 NOTE — Telephone Encounter (Signed)
Duplicate-opened in error. 

## 2018-09-25 NOTE — Progress Notes (Signed)
EPIC Encounter for ICM Monitoring  Patient Name: Stephen Cardenas is a 63 y.o. male Date: 09/25/2018 Primary Care Physican: [employer], [employer] Primary Cardiologist:Bensimhon Electrophysiologist:Allred LastWeight:190lbs weighs daily Today's Weight:  unknown                                                           Attempted call to patient and unable to reach.  Left message to return call regarding transmission. Transmission reviewed.   Report: Thoracic impedance continues to be abnormal but has improved after Furosemide was changed from PRN to 40 daily as advised by Dr Haroldine Laws on 09/21/2018.  Prescribed: Furosemide 40 mg Take 1 tablet (40 mg total) daily.  Labs:  08/31/2018 Creatinine 1.34, BUN 14, Potassium 4.1, Sodium 138, GFR 56->60  07/02/2018 Creatinine 1.16, BUN 11, Potassium 4.1, Sodium 135, GFR >60 05/04/2018 Creatinine 1.20, BUN 9,   Potassium 3.3, Sodium 137, GFR >60  03/09/2018 Creatinine 1.22, BUN 10, Potassium 4.2, Sodium 139, GFR >60  02/01/2018 Creatinine 1.14, BUN 11, Potassium 4.2, Sodium 140, GFR >60  11/28/2017 Creatinine 1.20, BUN 15, Potassium 3.9, Sodium 134, GFR >60 A complete set of results can be found in Results Review.  Recommendations: Unable to reach.  Follow-up plan: ICM clinic phone appointment on 09/29/2018 to recheck fluid levels.       Copy of ICM check sent to Dr. Rayann Heman and Dr Haroldine Laws.     3 month ICM trend: 09/25/2018    1 Year ICM trend:       [name], RN 09/25/2018 2:53 PM

## 2018-09-25 NOTE — Telephone Encounter (Signed)
Remote ICM transmission received.  Attempted call to patient regarding ICM remote transmission and left message, per DPR, to return call.    

## 2018-09-26 NOTE — Progress Notes (Signed)
Attempted call to patient and left detailed message.  Encouraged to call back for any symptoms, questions, or concerns.  Recheck Optivol 09/29/2018.

## 2018-09-28 ENCOUNTER — Other Ambulatory Visit (INDEPENDENT_AMBULATORY_CARE_PROVIDER_SITE_OTHER): Payer: Medicare Other

## 2018-09-28 DIAGNOSIS — E039 Hypothyroidism, unspecified: Secondary | ICD-10-CM | POA: Diagnosis not present

## 2018-09-28 LAB — T3, FREE: T3, Free: 2.5 pg/mL (ref 2.3–4.2)

## 2018-09-28 LAB — TSH: TSH: 62.51 u[IU]/mL — AB (ref 0.35–4.50)

## 2018-09-28 LAB — T4, FREE: Free T4: 0.89 ng/dL (ref 0.60–1.60)

## 2018-09-29 ENCOUNTER — Ambulatory Visit (INDEPENDENT_AMBULATORY_CARE_PROVIDER_SITE_OTHER): Payer: Medicare Other

## 2018-09-29 DIAGNOSIS — Z95810 Presence of automatic (implantable) cardiac defibrillator: Secondary | ICD-10-CM

## 2018-09-29 DIAGNOSIS — I5022 Chronic systolic (congestive) heart failure: Secondary | ICD-10-CM

## 2018-09-29 NOTE — Progress Notes (Signed)
EPIC Encounter for ICM Monitoring  Patient Name: Stephen Cardenas is a 63 y.o. male Date: 09/29/2018 Primary Care Physican: [employer], [employer] Primary Cardiologist:Bensimhon Electrophysiologist:Allred  Last Weight: 192 Today's Weight: 186 lbs        Heart Failure questions reviewed.  Pt has been symptomatic with weight gain. He has taken Furosemide twice this week and lost 6 pounds. Patient has been out of town and unable to limit salt intake.  His brother in law passed away.   Report: Thoracic impedance starting to trend toward baseline but still remains abnormal.  Prescribed: Furosemide40 mgTake 1 tablet (40 mg total) daily (increased on 09/21/2018 from 3 times a week to daily).  Labs: 08/31/2018 Creatinine1.34, BUN14, Potassium4.1, Sodium138, GFR56->60  07/02/2018 Creatinine1.16, BUN11, Potassium4.1, Sodium135, GFR>60 05/04/2018 Creatinine1.20, BUN9,Potassium3.3, Sodium137, GFR>60  03/09/2018 Creatinine1.22, BUN10, Potassium4.2, Sodium139, GFR>60  06/19/2019Creatinine 1.14, BUN11, Potassium4.2, Sodium140, GFR>60 11/28/2017 Creatinine 1.20, BUN 15, Potassium 3.9, Sodium 134, GFR >60 A complete set of results can be found in Results Review.  Recommendations: Advised would send copy of ICM report to Dr Rayann Heman and Dr Haroldine Laws.  Patient has been taking Furosemide daily as instructed since 09/21/2018.  Follow-up plan: ICM clinic phone appointment on 10/04/2018 (manual send) to recheck fluid levels.       Copy of ICM check sent to Dr. Rayann Heman and Dr Haroldine Laws.   3 month ICM trend: 09/29/2018    1 Year ICM trend:       [name], RN 09/29/2018 9:29 AM

## 2018-09-29 NOTE — Progress Notes (Signed)
Received: Yesterday  Message Contents  Bensimhon, Shaune Pascal, MD  Short, Laurie Panda, RN        Please repeat on Friday and see if he continues to improve. thanks

## 2018-10-02 ENCOUNTER — Telehealth: Payer: Self-pay

## 2018-10-02 MED ORDER — LEVOTHYROXINE SODIUM 175 MCG PO TABS: 175.0000 ug | ORAL_TABLET | Freq: Every day | ORAL | 0 refills | Status: DC

## 2018-10-02 NOTE — Telephone Encounter (Signed)
-----   Message from Philemon Kingdom, MD sent at 09/28/2018  5:08 PM EST ----- Gissella Niblack, can you please call pt: Patient's TSH has increased significantly since last visit.  We did decrease his levothyroxine dose a little, from 175 to 150 mcg daily, but we also optimize how he was taking it so she is TSH do not increase this much.  Did he stop the medication completely?  Did he change when he is taking it?  Please remove calcium or multivitamins or acid reflux medicines close to it?.  If not, let us go back to the 175 mcg daily and will recheck his test when he comes back in April.

## 2018-10-02 NOTE — Telephone Encounter (Signed)
Spoke to patient, he is taking his thyroid medication every day and exactly as he has been told.  Will send RX for the 175.  Patient agrees.

## 2018-10-03 ENCOUNTER — Ambulatory Visit (INDEPENDENT_AMBULATORY_CARE_PROVIDER_SITE_OTHER): Payer: Medicare Other

## 2018-10-03 DIAGNOSIS — Z95810 Presence of automatic (implantable) cardiac defibrillator: Secondary | ICD-10-CM

## 2018-10-03 DIAGNOSIS — I5022 Chronic systolic (congestive) heart failure: Secondary | ICD-10-CM

## 2018-10-03 NOTE — Progress Notes (Signed)
EPIC Encounter for ICM Monitoring  Patient Name: Stephen Cardenas is a 63 y.o. male Date: 10/03/2018 Primary Care Physican: [employer], [employer] Primary Cardiologist:Bensimhon Electrophysiologist:Allred        Last Weight: 189 lbs Today's Weight: 180 lbs                                                            Heart Failure questions reviewed.  Pt reported losing about 10 lbs of fluid and abdominal bloating has resolved.  He feels much better   Report: Thoracic impedance returned to normal after taking Lasix 40 mg daily as prescribed for maintenance dosage..  Prescribed: Furosemide40 mgTake 1 tablet (40 mg total)daily (increased on 09/21/2018 from 3 times a week to daily).  Labs: 08/31/2018 Creatinine1.34, BUN14, Potassium4.1, Sodium138, GFR56->60  07/02/2018 Creatinine1.16, BUN11, Potassium4.1, Sodium135, GFR>60 05/04/2018 Creatinine1.20, BUN9,Potassium3.3, Sodium137, GFR>60  03/09/2018 Creatinine1.22, BUN10, Potassium4.2, Sodium139, GFR>60  06/19/2019Creatinine 1.14, BUN11, Potassium4.2, Sodium140, GFR>60 11/28/2017 Creatinine 1.20, BUN 15, Potassium 3.9, Sodium 134, GFR >60 A complete set of results can be found in Results Review.  Recommendations:  Encouraged to call for any further fluid symptoms.   Follow-up plan: ICM clinic phone appointment on 11/06/2018.       Copy of ICM check sent to Dr. Rayann Heman.  3 month ICM trend: 10/03/2018    1 Year ICM trend:       [name], RN 10/03/2018 2:51 PM

## 2018-10-10 ENCOUNTER — Ambulatory Visit (INDEPENDENT_AMBULATORY_CARE_PROVIDER_SITE_OTHER): Payer: Medicare Other | Admitting: *Deleted

## 2018-10-10 DIAGNOSIS — I255 Ischemic cardiomyopathy: Secondary | ICD-10-CM

## 2018-10-10 DIAGNOSIS — I5022 Chronic systolic (congestive) heart failure: Secondary | ICD-10-CM

## 2018-10-11 LAB — CUP PACEART REMOTE DEVICE CHECK
Battery Voltage: 2.98 V
Brady Statistic AP VP Percent: 0.11 %
Brady Statistic AS VP Percent: 0.84 %
Brady Statistic AS VS Percent: 74.85 %
Brady Statistic RV Percent Paced: 0.96 %
HighPow Impedance: 68 Ohm
Implantable Lead Implant Date: 20140813
Implantable Lead Implant Date: 20140813
Implantable Lead Location: 753859
Lead Channel Impedance Value: 342 Ohm
Lead Channel Impedance Value: 399 Ohm
Lead Channel Pacing Threshold Amplitude: 0.75 V
Lead Channel Pacing Threshold Amplitude: 1 V
Lead Channel Pacing Threshold Pulse Width: 0.4 ms
Lead Channel Pacing Threshold Pulse Width: 0.4 ms
Lead Channel Sensing Intrinsic Amplitude: 2.25 mV
Lead Channel Sensing Intrinsic Amplitude: 21.125 mV
Lead Channel Sensing Intrinsic Amplitude: 21.125 mV
Lead Channel Setting Pacing Amplitude: 1.5 V
Lead Channel Setting Pacing Amplitude: 2 V
Lead Channel Setting Pacing Pulse Width: 0.4 ms
Lead Channel Setting Sensing Sensitivity: 0.3 mV
MDC IDC LEAD LOCATION: 753860
MDC IDC MSMT BATTERY REMAINING LONGEVITY: 62 mo
MDC IDC MSMT LEADCHNL RA SENSING INTR AMPL: 2.25 mV
MDC IDC MSMT LEADCHNL RV IMPEDANCE VALUE: 399 Ohm
MDC IDC PG IMPLANT DT: 20140813
MDC IDC SESS DTM: 20200225092407
MDC IDC STAT BRADY AP VS PERCENT: 24.2 %
MDC IDC STAT BRADY RA PERCENT PACED: 24.3 %

## 2018-10-17 NOTE — Progress Notes (Signed)
Remote ICD transmission.   

## 2018-10-30 ENCOUNTER — Telehealth: Payer: Self-pay

## 2018-10-30 NOTE — Telephone Encounter (Signed)
Returned patient call as requested by voice mail message.  Patient has had diarrhea for past 7-10 days and feels dehydrated.  Weight dropped from 180 to 174 lbs and feeling a little weak.  He reported the diarrhea comes and goes and think imodium is helping.  Sent remote transmission which suggests dryness since 06/17/2019.  He denies nausea and advised to increase fluid intake from the recommended 64 oz fir heart patient to approximately 75-80 oz for the next couple of days. Encouraged to eat bland light meals for the next couple of days and avoid foods that may aggravate his GI system such as spicy foods.  Advised if weight loss, diarrhea or weakness increases to call his PCP for an appointment or use ER if needed.  He said he will do so.  He thinks he is okay for now and will increase fluid intake.

## 2018-11-06 ENCOUNTER — Ambulatory Visit (INDEPENDENT_AMBULATORY_CARE_PROVIDER_SITE_OTHER): Payer: Medicare Other

## 2018-11-06 ENCOUNTER — Other Ambulatory Visit: Payer: Self-pay

## 2018-11-06 DIAGNOSIS — Z95810 Presence of automatic (implantable) cardiac defibrillator: Secondary | ICD-10-CM | POA: Diagnosis not present

## 2018-11-06 DIAGNOSIS — I5022 Chronic systolic (congestive) heart failure: Secondary | ICD-10-CM | POA: Diagnosis not present

## 2018-11-07 DIAGNOSIS — K922 Gastrointestinal hemorrhage, unspecified: Secondary | ICD-10-CM | POA: Diagnosis not present

## 2018-11-08 ENCOUNTER — Other Ambulatory Visit (HOSPITAL_COMMUNITY): Payer: Self-pay | Admitting: Student

## 2018-11-08 ENCOUNTER — Telehealth: Payer: Self-pay

## 2018-11-08 NOTE — Telephone Encounter (Signed)
Remote ICM transmission received.  Attempted call to patient regarding ICM remote transmission and left detailed message, per DPR, with next ICM remote transmission date of 12/11/2018.  Advised to return call for any fluid symptoms or questions.    

## 2018-11-08 NOTE — Progress Notes (Signed)
EPIC Encounter for ICM Monitoring  Patient Name: Stephen Cardenas is a 63 y.o. male Date: 11/08/2018 Primary Care Physican: [employer], [employer] Primary Cardiologist:Bensimhon Electrophysiologist:Allred 10/03/2018 Weight:180lbs  11/08/2018 Weight:unknown   Attempted call to patient and unable to reach.  Left detailed message per DPR regarding transmission. Transmission reviewed.   Report: Thoracic impedance returned to baselinenormal.  Pt was having GI issues with diarrhea which previous report showed dryness.    Prescribed:Furosemide40 mgTake 1 tablet (40 mg total)daily(increased on 09/21/2018 from 3 times a week to daily).  Labs: 08/31/2018 Creatinine1.34, BUN14, Potassium4.1, Sodium138, GFR56->60  07/02/2018 Creatinine1.16, BUN11, Potassium4.1, Sodium135, GFR>60 05/04/2018 Creatinine1.20, BUN9,Potassium3.3, Sodium137, GFR>60  03/09/2018 Creatinine1.22, BUN10, Potassium4.2, Sodium139, GFR>60  06/19/2019Creatinine 1.14, BUN11, Potassium4.2, Sodium140, GFR>60 11/28/2017 Creatinine 1.20, BUN 15, Potassium 3.9, Sodium 134, GFR >60 A complete set of results can be found in Results Review.  Recommendations: Left voice mail with ICM number and encouraged to call if experiencing any fluid symptoms.   Follow-up plan: ICM clinic phone appointment on 12/11/2018. Office visit with Dr Haroldine Laws 01/02/2019.  Copy of ICM check sent to Dr.Allred.  3 month ICM trend: 11/06/2018    1 Year ICM trend:       [name], RN 11/08/2018 9:34 AM

## 2018-11-10 ENCOUNTER — Telehealth (HOSPITAL_COMMUNITY): Payer: Self-pay | Admitting: *Deleted

## 2018-11-10 NOTE — Telephone Encounter (Signed)
Most recent office visit and documentation of stent faxed to [facility] at [facility] health 276 [phone] (fax #)

## 2018-11-30 ENCOUNTER — Ambulatory Visit: Payer: Medicare Other | Admitting: Internal Medicine

## 2018-12-01 ENCOUNTER — Other Ambulatory Visit: Payer: Self-pay

## 2018-12-01 ENCOUNTER — Encounter (HOSPITAL_COMMUNITY): Payer: Self-pay | Admitting: Emergency Medicine

## 2018-12-01 ENCOUNTER — Emergency Department (HOSPITAL_COMMUNITY): Payer: Medicare Other

## 2018-12-01 ENCOUNTER — Observation Stay (HOSPITAL_COMMUNITY)
Admission: EM | Admit: 2018-12-01 | Discharge: 2018-12-03 | Disposition: A | Discharge status: Home / Self Care | Payer: Medicare Other | Attending: Internal Medicine | Admitting: Internal Medicine

## 2018-12-01 DIAGNOSIS — I10 Essential (primary) hypertension: Secondary | ICD-10-CM | POA: Diagnosis present

## 2018-12-01 DIAGNOSIS — F419 Anxiety disorder, unspecified: Secondary | ICD-10-CM | POA: Insufficient documentation

## 2018-12-01 DIAGNOSIS — I2510 Atherosclerotic heart disease of native coronary artery without angina pectoris: Secondary | ICD-10-CM | POA: Diagnosis not present

## 2018-12-01 DIAGNOSIS — N183 Chronic kidney disease, stage 3 unspecified: Secondary | ICD-10-CM

## 2018-12-01 DIAGNOSIS — E039 Hypothyroidism, unspecified: Secondary | ICD-10-CM | POA: Insufficient documentation

## 2018-12-01 DIAGNOSIS — R0602 Shortness of breath: Secondary | ICD-10-CM | POA: Diagnosis not present

## 2018-12-01 DIAGNOSIS — Z79899 Other long term (current) drug therapy: Secondary | ICD-10-CM | POA: Insufficient documentation

## 2018-12-01 DIAGNOSIS — I5023 Acute on chronic systolic (congestive) heart failure: Secondary | ICD-10-CM | POA: Diagnosis present

## 2018-12-01 DIAGNOSIS — Z7982 Long term (current) use of aspirin: Secondary | ICD-10-CM | POA: Diagnosis not present

## 2018-12-01 DIAGNOSIS — I480 Paroxysmal atrial fibrillation: Secondary | ICD-10-CM | POA: Diagnosis not present

## 2018-12-01 DIAGNOSIS — I509 Heart failure, unspecified: Secondary | ICD-10-CM | POA: Diagnosis not present

## 2018-12-01 DIAGNOSIS — Z87891 Personal history of nicotine dependence: Secondary | ICD-10-CM | POA: Insufficient documentation

## 2018-12-01 DIAGNOSIS — Z7901 Long term (current) use of anticoagulants: Secondary | ICD-10-CM | POA: Insufficient documentation

## 2018-12-01 DIAGNOSIS — I5022 Chronic systolic (congestive) heart failure: Principal | ICD-10-CM | POA: Insufficient documentation

## 2018-12-01 DIAGNOSIS — I130 Hypertensive heart and chronic kidney disease with heart failure and stage 1 through stage 4 chronic kidney disease, or unspecified chronic kidney disease: Secondary | ICD-10-CM | POA: Diagnosis not present

## 2018-12-01 DIAGNOSIS — I4510 Unspecified right bundle-branch block: Secondary | ICD-10-CM | POA: Diagnosis not present

## 2018-12-01 DIAGNOSIS — F329 Major depressive disorder, single episode, unspecified: Secondary | ICD-10-CM

## 2018-12-01 DIAGNOSIS — I110 Hypertensive heart disease with heart failure: Secondary | ICD-10-CM | POA: Diagnosis not present

## 2018-12-01 DIAGNOSIS — F32A Depression, unspecified: Secondary | ICD-10-CM

## 2018-12-01 DIAGNOSIS — E785 Hyperlipidemia, unspecified: Secondary | ICD-10-CM | POA: Diagnosis not present

## 2018-12-01 LAB — CBC
HCT: 40.6 % (ref 39.0–52.0)
Hemoglobin: 13.2 g/dL (ref 13.0–17.0)
MCH: 32.5 pg (ref 26.0–34.0)
MCHC: 32.5 g/dL (ref 30.0–36.0)
MCV: 100 fL (ref 80.0–100.0)
Platelets: 351 10*3/uL (ref 150–400)
RBC: 4.06 MIL/uL — ABNORMAL LOW (ref 4.22–5.81)
RDW: 16.7 % — ABNORMAL HIGH (ref 11.5–15.5)
WBC: 7.8 10*3/uL (ref 4.0–10.5)
nRBC: 0 % (ref 0.0–0.2)

## 2018-12-01 LAB — COMPREHENSIVE METABOLIC PANEL
ALT: 100 U/L — ABNORMAL HIGH (ref 0–44)
AST: 30 U/L (ref 15–41)
Albumin: 4.2 g/dL (ref 3.5–5.0)
Alkaline Phosphatase: 103 U/L (ref 38–126)
Anion gap: 13 (ref 5–15)
BUN: 18 mg/dL (ref 8–23)
CO2: 21 mmol/L — ABNORMAL LOW (ref 22–32)
Calcium: 9 mg/dL (ref 8.9–10.3)
Chloride: 103 mmol/L (ref 98–111)
Creatinine, Ser: 1.27 mg/dL — ABNORMAL HIGH (ref 0.61–1.24)
GFR calc Af Amer: >60 mL/min (ref >60)
GFR calc non Af Amer: 59 mL/min — ABNORMAL LOW (ref >60)
Glucose, Bld: 124 mg/dL — ABNORMAL HIGH (ref 70–99)
Potassium: 4 mmol/L (ref 3.5–5.1)
Sodium: 137 mmol/L (ref 135–145)
Total Bilirubin: 2.6 mg/dL — ABNORMAL HIGH (ref 0.3–1.2)
Total Protein: 8.2 g/dL — ABNORMAL HIGH (ref 6.5–8.1)

## 2018-12-01 LAB — BRAIN NATRIURETIC PEPTIDE: B Natriuretic Peptide: >4500 pg/mL — ABNORMAL HIGH (ref 0.0–100.0)

## 2018-12-01 LAB — TSH: TSH: 38.051 u[IU]/mL — ABNORMAL HIGH (ref 0.350–4.500)

## 2018-12-01 LAB — TROPONIN I: Troponin I: 0.03 ng/mL (ref <0.03)

## 2018-12-01 LAB — DIGOXIN LEVEL
Digoxin Level: 0.9 ng/mL (ref 0.8–2.0)
Digoxin Level: 1 ng/mL (ref 0.8–2.0)

## 2018-12-01 LAB — MAGNESIUM: Magnesium: 2.2 mg/dL (ref 1.7–2.4)

## 2018-12-01 MED ORDER — DIGOXIN 125 MCG PO TABS
0.1250 mg | ORAL_TABLET | Freq: Every day | ORAL | Status: DC
  Administered 2018-12-01 – 2018-12-03 (×3): 0.125 mg via ORAL
  Filled 2018-12-01 (×7): qty 1

## 2018-12-01 MED ORDER — CARVEDILOL 3.125 MG PO TABS
3.1250 mg | ORAL_TABLET | Freq: Two times a day (BID) | ORAL | Status: DC
  Administered 2018-12-01 – 2018-12-03 (×4): 3.125 mg via ORAL
  Filled 2018-12-01 (×10): qty 1

## 2018-12-01 MED ORDER — ALPRAZOLAM 0.5 MG PO TABS
0.5000 mg | ORAL_TABLET | Freq: Every evening | ORAL | Status: DC | PRN
  Administered 2018-12-01 – 2018-12-02 (×2): 0.5 mg via ORAL
  Filled 2018-12-01 (×2): qty 1

## 2018-12-01 MED ORDER — VITAMIN B-12 1000 MCG PO TABS
2000.0000 ug | ORAL_TABLET | Freq: Every day | ORAL | Status: DC
  Administered 2018-12-01 – 2018-12-03 (×3): 2000 ug via ORAL
  Filled 2018-12-01 (×2): qty 2

## 2018-12-01 MED ORDER — VITAMIN D 25 MCG (1000 UNIT) PO TABS
2000.0000 [IU] | ORAL_TABLET | Freq: Every day | ORAL | Status: DC
  Administered 2018-12-01 – 2018-12-03 (×3): 2000 [IU] via ORAL
  Filled 2018-12-01 (×2): qty 2

## 2018-12-01 MED ORDER — LEVOTHYROXINE SODIUM 75 MCG PO TABS
175.0000 ug | ORAL_TABLET | Freq: Every day | ORAL | Status: DC
  Administered 2018-12-01 – 2018-12-02 (×2): 175 ug via ORAL
  Filled 2018-12-01 (×2): qty 1

## 2018-12-01 MED ORDER — AMIODARONE HCL 200 MG PO TABS
200.0000 mg | ORAL_TABLET | Freq: Every day | ORAL | Status: DC
  Administered 2018-12-01 – 2018-12-03 (×3): 200 mg via ORAL
  Filled 2018-12-01 (×3): qty 1

## 2018-12-01 MED ORDER — FUROSEMIDE 10 MG/ML IJ SOLN
40.0000 mg | Freq: Once | INTRAMUSCULAR | Status: AC
  Administered 2018-12-01: 09:00:00 40 mg via INTRAVENOUS
  Filled 2018-12-01: qty 4

## 2018-12-01 MED ORDER — DULOXETINE HCL 60 MG PO CPEP
90.0000 mg | ORAL_CAPSULE | Freq: Every day | ORAL | Status: DC
  Administered 2018-12-01 – 2018-12-02 (×2): 90 mg via ORAL
  Filled 2018-12-01 (×2): qty 1

## 2018-12-01 MED ORDER — SODIUM CHLORIDE 0.9 % IV SOLN: 250.0000 mL | INTRAVENOUS | Status: DC | PRN

## 2018-12-01 MED ORDER — FUROSEMIDE 10 MG/ML IJ SOLN
40.0000 mg | Freq: Once | INTRAMUSCULAR | Status: AC
  Administered 2018-12-01: 40 mg via INTRAVENOUS
  Filled 2018-12-01: qty 4

## 2018-12-01 MED ORDER — SODIUM CHLORIDE 0.9% FLUSH: 3.0000 mL | INTRAVENOUS | Status: DC | PRN

## 2018-12-01 MED ORDER — PANTOPRAZOLE SODIUM 40 MG PO TBEC
40.0000 mg | DELAYED_RELEASE_TABLET | Freq: Every day | ORAL | Status: DC
  Administered 2018-12-01 – 2018-12-02 (×2): 40 mg via ORAL
  Filled 2018-12-01 (×2): qty 1

## 2018-12-01 MED ORDER — ATORVASTATIN CALCIUM 20 MG PO TABS
20.0000 mg | ORAL_TABLET | Freq: Every day | ORAL | Status: DC
  Administered 2018-12-01 – 2018-12-02 (×2): 20 mg via ORAL
  Filled 2018-12-01 (×2): qty 1

## 2018-12-01 MED ORDER — SACUBITRIL-VALSARTAN 49-51 MG PO TABS
1.0000 | ORAL_TABLET | Freq: Two times a day (BID) | ORAL | Status: DC
  Administered 2018-12-01 – 2018-12-03 (×4): 1 via ORAL
  Filled 2018-12-01 (×10): qty 1

## 2018-12-01 MED ORDER — ONDANSETRON HCL 4 MG/2ML IJ SOLN: 4.0000 mg | Freq: Four times a day (QID) | INTRAMUSCULAR | Status: DC | PRN

## 2018-12-01 MED ORDER — SODIUM CHLORIDE 0.9% FLUSH
3.0000 mL | Freq: Two times a day (BID) | INTRAVENOUS | Status: DC
  Administered 2018-12-01 – 2018-12-03 (×5): 3 mL via INTRAVENOUS

## 2018-12-01 MED ORDER — ACETAMINOPHEN 325 MG PO TABS: 650.0000 mg | ORAL_TABLET | ORAL | Status: DC | PRN

## 2018-12-01 MED ORDER — APIXABAN 5 MG PO TABS
5.0000 mg | ORAL_TABLET | Freq: Two times a day (BID) | ORAL | Status: DC
  Administered 2018-12-01 – 2018-12-03 (×5): 5 mg via ORAL
  Filled 2018-12-01 (×5): qty 1

## 2018-12-01 NOTE — ED Provider Notes (Addendum)
[facility] EMERGENCY DEPARTMENT Provider Note   CSN: [id number] Arrival date & time: 12/01/18  0300    History   Chief Complaint Chief Complaint  Patient presents with  . Shortness of Breath    HPI Stephen Cardenas is a 63 y.o. male.     HPI Patient presents with shortness of breath.  History of CHF and COPD.  States this feels like a CHF.  For the last 3 or 4 days he has been short of breath.  States he has been off his Lasix for around 3 days also.  States he has not checked his weight in around 5 days.  Appears his ejection fraction is around 15%.  He has refused a VAD.  No chest pain.  States worse with lying down.  Mild swelling in his legs.  No cough or fevers. Past Medical History:  Diagnosis Date  . AICD (automatic cardioverter/defibrillator) present 2014  . CAD (coronary artery disease)    S/p multiple prior MIs/PCIs // amdx in 2014 with acute stent thrombosis c/b CGS // Admx to [facility] in 04/2329 2/2 a/c systolic HF >> [facility] 0/7/62: mLAD 95 ISR, dLAD 30, D2 99; OM2 95 ISR; pRCA 30/50, mRCA 90/80, dRCA 30 EF 15 >> min to no viability on Thallium study (not surgical candidate) - PCI>> 3.5 x 20 mm Promus DES to the LCx 3 x 38 mm Promus DES to the proximal mid RCA  . Chronic systolic heart failure (HCC)    2/2 to ischemic CM - Echo 02/12/17:  Mild LVH, EF 15-20, mod to severe MR, severe LAE, mild RVE, mod reduced RVSF, mild RAE, mod TR, PASP 65  . COPD (chronic obstructive pulmonary disease) ([place])   . History of MI (myocardial infarction)   . History of pneumonia   . HTN (hypertension)   . Hyperlipidemia 03/08/2017  . Hypothyroidism   . OSA (obstructive sleep apnea) 03/08/2017    Patient Active Problem List   Diagnosis Date Noted  . PAF (paroxysmal atrial fibrillation) ([place]) 10/10/2017  . Pulmonary nodule, left 05/23/2017  . COPD (chronic obstructive pulmonary disease) ([place]) 05/23/2017  . ICD (implantable cardioverter-defibrillator) in place 03/08/2017  . Essential  hypertension 03/08/2017  . Hyperlipidemia 03/08/2017  . OSA (obstructive sleep apnea) 03/08/2017  . CAD (coronary artery disease)   . Pleural effusion   . Chronic systolic heart failure ([place])   . Acute respiratory failure with hypoxia ([place]) 02/09/2017  . Ventilator dependent ([place])   . Shortness of breath   . SOB (shortness of breath)     Past Surgical History:  Procedure Laterality Date  . CARDIAC DEFIBRILLATOR PLACEMENT  2014   Medtronic Every XT DR ICD implanted at the [facility] for primary prevention  . CARDIOVERSION N/A 03/09/2018   Procedure: CARDIOVERSION;  Surgeon: Jolaine Artist, MD;  Location: [facility] ENDOSCOPY;  Service: Cardiovascular;  Laterality: N/A;  . CORONARY STENT INTERVENTION N/A 02/22/2017   Procedure: Coronary Stent Intervention;  Surgeon: Sherren Mocha, MD;  Location: [place] CV LAB;  Service: Cardiovascular;  Laterality: N/A;  CFX and RCA  . FACIAL RECONSTRUCTION SURGERY Right 1984  . FEMUR FRACTURE SURGERY Right 1984  . RIGHT/LEFT HEART CATH AND CORONARY ANGIOGRAPHY N/A 02/14/2017   Procedure: Right/Left Heart Cath and Coronary Angiography;  Surgeon: Jolaine Artist, MD;  Location: [place] CV LAB;  Service: Cardiovascular;  Laterality: N/A;  . WRIST FRACTURE SURGERY Right 2015        Home Medications    Prior to Admission medications  Medication Sig Start Date End Date Taking? Authorizing Provider  ALPRAZolam Duanne Moron) 0.5 MG tablet Take 0.5-1 mg by mouth at bedtime.     [provider]  amiodarone (PACERONE) 200 MG tablet 1  TABLET  ONCE  DAILY 11/08/18   Bensimhon, Shaune Pascal, MD  apixaban (ELIQUIS) 5 MG TABS tablet Take 1 tablet (5 mg total) by mouth 2 (two) times daily. 02/13/18   Bensimhon, Shaune Pascal, MD  aspirin EC 81 MG tablet Take 81 mg by mouth daily.    [provider]  atorvastatin (LIPITOR) 20 MG tablet Take 20 mg by mouth at bedtime.    [provider]  carvedilol (COREG) 3.125 MG tablet Take 1 tablet (3.125 mg  total) by mouth 2 (two) times daily with a meal. 03/17/17   Arbutus Leas, NP  Cholecalciferol (VITAMIN D3) 2000 units TABS Take 2,000 Units by mouth daily.    [provider]  digoxin (LANOXIN) 0.125 MG tablet Take 1 tablet (0.125 mg total) by mouth daily. 04/28/18   Bensimhon, Shaune Pascal, MD  DULoxetine (CYMBALTA) 30 MG capsule Take 90 mg by mouth at bedtime.     [provider]  Ferrous Sulfate (IRON) 325 (65 Fe) MG TABS TAKE 1 TABLET BY MOUTH TWICE DAILY WITH A MEAL 07/25/18   Bensimhon, Shaune Pascal, MD  furosemide (LASIX) 40 MG tablet Take 1 tablet (40 mg) by mouth once daily 09/21/18   Bensimhon, Shaune Pascal, MD  levothyroxine (SYNTHROID, LEVOTHROID) 175 MCG tablet Take 1 tablet (175 mcg total) by mouth daily before breakfast. 10/02/18   Philemon Kingdom, MD  Multiple Vitamins-Minerals (MULTIVITAMIN PO) Take 1 tablet by mouth daily.    [provider]  pantoprazole (PROTONIX) 40 MG tablet Take 1 tablet (40 mg total) by mouth at bedtime. 04/11/18   Bensimhon, Shaune Pascal, MD  potassium chloride (K-DUR,KLOR-CON) 10 MEQ tablet Take 1 tablet (10 mEq total) by mouth as needed (take with lasix only). 04/29/17   Bensimhon, Shaune Pascal, MD  sacubitril-valsartan (ENTRESTO) 49-51 MG Take 1 tablet by mouth 2 (two) times daily. 08/31/18   Bensimhon, Shaune Pascal, MD  spironolactone (ALDACTONE) 25 MG tablet Take 1 tablet (25 mg total) by mouth daily. 03/17/17   Arbutus Leas, NP  vitamin B-12 (CYANOCOBALAMIN) 1000 MCG tablet Take 2,000 mcg by mouth daily.    [provider]    Family History Family History  Problem Relation Age of Onset  . Diabetes Mother   . Heart disease Mother   . Stroke Mother   . Heart attack Mother   . Heart disease Father   . Heart attack Father   . Stroke Father   . COPD Sister   . Congestive Heart Failure Sister   . Hypertension Sister   . Diabetes Sister   . Hypertension Brother   . Diabetes Sister   . Hypertension Sister   . Diabetes Sister   .  Hypertension Sister     Social History Social History   Tobacco Use  . Smoking status: Former Smoker    Packs/day: 1.00    Years: 28.00    Pack years: 28.00    Types: Cigarettes    Start date: 1968    Last attempt to quit: 1996    Years since quitting: 24.3  . Smokeless tobacco: Never Used  Substance Use Topics  . Alcohol use: No  . Drug use: No     Allergies   Codeine and Mirtazapine   Review of Systems Review of  Systems  Constitutional: Negative for chills.  HENT: Negative for congestion.   Eyes: Negative for visual disturbance.  Respiratory: Positive for shortness of breath.   Cardiovascular: Negative for chest pain and leg swelling.  Gastrointestinal: Positive for abdominal pain. Negative for blood in stool.  Genitourinary: Negative for flank pain.  Musculoskeletal: Negative for back pain.  Skin: Negative for rash.  Neurological: Positive for weakness.  Psychiatric/Behavioral: Negative for confusion.     Physical Exam Updated Vital Signs BP (!) 154/116   Pulse 92   Temp 98.6 F (37 C)   Resp (!) 30   SpO2 96%   Physical Exam Vitals signs and nursing note reviewed.  HENT:     Head: Normocephalic.  Cardiovascular:     Rate and Rhythm: Regular rhythm.  Pulmonary:     Comments: Few rales at bases.  Some dyspnea with pursed lip breathing.  No wheezing. Chest:     Chest wall: No tenderness.  Abdominal:     Tenderness: There is no abdominal tenderness.  Musculoskeletal:     Right lower leg: No edema.     Left lower leg: No edema.  Skin:    General: Skin is warm.     Capillary Refill: Capillary refill takes less than 2 seconds.  Neurological:     General: No focal deficit present.     Mental Status: He is alert.      ED Treatments / Results  Labs (all labs ordered are listed, but only abnormal results are displayed) Labs Reviewed  CBC - Abnormal; Notable for the following components:      Result Value   RBC 4.06 (*)    RDW 16.7 (*)     All other components within normal limits  BRAIN NATRIURETIC PEPTIDE  TROPONIN I  COMPREHENSIVE METABOLIC PANEL  DIGOXIN LEVEL    EKG EKG Interpretation  Date/Time:  Friday December 01 2018 06:42:38 EDT Ventricular Rate:  91 PR Interval:    QRS Duration: 181 QT Interval:  421 QTC Calculation: 518 R Axis:   -40 Text Interpretation:  Sinus or ectopic atrial rhythm Prolonged PR interval Consider left atrial enlargement RBBB and LAFB Borderline ST depression, lateral leads Baseline wander in lead(s) I III aVL V3 V6 difficult to interpret secondary to baseline wander but no obvious acute changes Confirmed by Merrily Pew [phone]) on 12/01/2018 7:05:26 AM   Radiology No results found.  Procedures Procedures (including critical care time)  Medications Ordered in ED Medications - No data to display   Initial Impression / Assessment and Plan / ED Course  I have reviewed the triage vital signs and the nursing notes.  Pertinent labs & imaging results that were available during my care of the patient were reviewed by me and considered in my medical decision making (see chart for details).        Patient with shortness of breath.  Has been off his Lasix for a few days.  History of CHF with a bad ejection fraction.  Dyspneic at rest and gets very dyspneic even with exertion such as standing up for his weight.  Weight actually is down around 4 kg from most recent weight which was a few months ago.  However BNP is very elevated and into the pacemaker interrogation did show volume overload.  X-ray shows some vascular congestion.  Questionable pneumonia on x-ray, however does not have a productive cough or fevers. Will admit to hospitalist.  Dr. Harl Bowie from cardiology is coming to see the patient  Final Clinical Impressions(s) / ED Diagnoses   Final diagnoses:  None    ED Discharge Orders    None       Davonna Belling, MD 12/01/18 [id number]    Davonna Belling, MD 12/01/18 [phone]

## 2018-12-01 NOTE — ED Notes (Signed)
EDP Notified of troponin 0.03

## 2018-12-01 NOTE — ED Notes (Signed)
Patient complaining of lower back pain. Repositioned patient, patient states "it did help a little bit."

## 2018-12-01 NOTE — ED Notes (Signed)
Pacemaker report given to EDP

## 2018-12-01 NOTE — ED Triage Notes (Addendum)
Pt C/O SOB and abdominal swelling for 3 days. Pt states "this feels like my CHF." Denies fevers or cough. Pt reports not taking his lasix for past 3 days.

## 2018-12-01 NOTE — H&P (Signed)
History and Physical    Stephen Cardenas ZDG:[id number] DOB: July 31, 1956 DOA: 12/01/2018  Referring MD/NP/PA: Dr. Alvino Chapel PCP: [facility]  Patient coming from: Home  Chief Complaint: Shortness of breath, dyspnea on exertion and abdominal tightness  HPI: Stephen Cardenas is a 63 y.o. male with a past medical history significant for coronary artery disease, chronic systolic heart failure (ejection fraction 10-15%), paroxysmal atrial fibrillation, hypertension, chronic kidney disease is stage II-III, hyperlipidemia, hypothyroidism and gastroesophageal reflux disease; who presented to the hospital secondary to increased shortness of breath, dyspnea on exertion and abdominal tightness.  Patient reports visiting family members and no having his Lasix with pain for the last 3-4 days prior to admission.  Patient also expressed some diet indiscretion.  Patient denies any chest pain, no fever, no chills, no cough; no hemoptysis, no melena, no hematuria, no dysuria, no nausea, no vomiting, no focal weakness.  Patient expressed good compliance with the rest of his medications. In the ED chest x-ray demonstrated vascular congestion without frank pulmonary edema.  BNP was significantly elevated and the patient was found short of breath with positive orthopnea during evaluation. Troponin mildly elevated at 0.03 but no acute ischemic changes on EKG.  Hemoglobin 13.2 WBCs 7.8 creatinine 1.2 (patient chronically with chronic kidney disease stage II-III).  Cardiology was consulted, IV Lasix 40 mg IV given in the ED and TRH contacted to place in observation for further diuresis and management of his heart failure exacerbation.  Past Medical/Surgical History: Past Medical History:  Diagnosis Date  . AICD (automatic cardioverter/defibrillator) present 2014  . CAD (coronary artery disease)    S/p multiple prior MIs/PCIs // amdx in 2014 with acute stent thrombosis c/b CGS // Admx to [facility] in 12/9561 2/2 a/c  systolic HF >> [facility] 03/23/55: mLAD 95 ISR, dLAD 30, D2 99; OM2 95 ISR; pRCA 30/50, mRCA 90/80, dRCA 30 EF 15 >> min to no viability on Thallium study (not surgical candidate) - PCI>> 3.5 x 20 mm Promus DES to the LCx 3 x 38 mm Promus DES to the proximal mid RCA  . Chronic systolic heart failure (HCC)    2/2 to ischemic CM - Echo 02/12/17:  Mild LVH, EF 15-20, mod to severe MR, severe LAE, mild RVE, mod reduced RVSF, mild RAE, mod TR, PASP 65  . COPD (chronic obstructive pulmonary disease) ([place])   . History of MI (myocardial infarction)   . History of pneumonia   . HTN (hypertension)   . Hyperlipidemia 03/08/2017  . Hypothyroidism   . OSA (obstructive sleep apnea) 03/08/2017    Past Surgical History:  Procedure Laterality Date  . CARDIAC DEFIBRILLATOR PLACEMENT  2014   Medtronic Every XT DR ICD implanted at the [facility] for primary prevention  . CARDIOVERSION N/A 03/09/2018   Procedure: CARDIOVERSION;  Surgeon: Jolaine Artist, MD;  Location: [facility] ENDOSCOPY;  Service: Cardiovascular;  Laterality: N/A;  . CORONARY STENT INTERVENTION N/A 02/22/2017   Procedure: Coronary Stent Intervention;  Surgeon: Sherren Mocha, MD;  Location: [place] CV LAB;  Service: Cardiovascular;  Laterality: N/A;  CFX and RCA  . FACIAL RECONSTRUCTION SURGERY Right 1984  . FEMUR FRACTURE SURGERY Right 1984  . RIGHT/LEFT HEART CATH AND CORONARY ANGIOGRAPHY N/A 02/14/2017   Procedure: Right/Left Heart Cath and Coronary Angiography;  Surgeon: Jolaine Artist, MD;  Location: [place] CV LAB;  Service: Cardiovascular;  Laterality: N/A;  . WRIST FRACTURE SURGERY Right 2015    Social History:  reports that he quit smoking about 24 years  ago. His smoking use included cigarettes. He started smoking about 52 years ago. He has a 28.00 pack-year smoking history. He has never used smokeless tobacco. He reports that he does not drink alcohol or use drugs.  Allergies: Allergies  Allergen Reactions  . Codeine Other (See  Comments) and Nausea Only    Increased work of breathing, diaphoretic  . Mirtazapine     Other reaction(s): Other - See Comments    Family History:  Family History  Problem Relation Age of Onset  . Diabetes Mother   . Heart disease Mother   . Stroke Mother   . Heart attack Mother   . Heart disease Father   . Heart attack Father   . Stroke Father   . COPD Sister   . Congestive Heart Failure Sister   . Hypertension Sister   . Diabetes Sister   . Hypertension Brother   . Diabetes Sister   . Hypertension Sister   . Diabetes Sister   . Hypertension Sister     Prior to Admission medications   Medication Sig Start Date End Date Taking? Authorizing Provider  ALPRAZolam Duanne Moron) 0.5 MG tablet Take 0.5-1 mg by mouth at bedtime.    Yes [provider]  amiodarone (PACERONE) 200 MG tablet 1  TABLET  ONCE  DAILY 11/08/18  Yes Bensimhon, Shaune Pascal, MD  apixaban (ELIQUIS) 5 MG TABS tablet Take 1 tablet (5 mg total) by mouth 2 (two) times daily. 02/13/18  Yes Bensimhon, Shaune Pascal, MD  atorvastatin (LIPITOR) 20 MG tablet Take 20 mg by mouth at bedtime.   Yes [provider]  carvedilol (COREG) 3.125 MG tablet Take 1 tablet (3.125 mg total) by mouth 2 (two) times daily with a meal. 03/17/17  Yes Arbutus Leas, NP  Cholecalciferol (VITAMIN D3) 2000 units TABS Take 2,000 Units by mouth daily.   Yes [provider]  digoxin (LANOXIN) 0.125 MG tablet Take 1 tablet (0.125 mg total) by mouth daily. 04/28/18  Yes Bensimhon, Shaune Pascal, MD  DULoxetine (CYMBALTA) 30 MG capsule Take 90 mg by mouth at bedtime.    Yes [provider]  Ferrous Sulfate (IRON) 325 (65 Fe) MG TABS TAKE 1 TABLET BY MOUTH TWICE DAILY WITH A MEAL 07/25/18  Yes Bensimhon, Shaune Pascal, MD  furosemide (LASIX) 40 MG tablet Take 1 tablet (40 mg) by mouth once daily 09/21/18  Yes Bensimhon, Shaune Pascal, MD  levothyroxine (SYNTHROID, LEVOTHROID) 175 MCG tablet Take 1 tablet (175 mcg total) by mouth daily before  breakfast. 10/02/18  Yes Philemon Kingdom, MD  Multiple Vitamins-Minerals (MULTIVITAMIN PO) Take 1 tablet by mouth daily.   Yes [provider]  pantoprazole (PROTONIX) 40 MG tablet Take 1 tablet (40 mg total) by mouth at bedtime. 04/11/18  Yes Bensimhon, Shaune Pascal, MD  potassium chloride (K-DUR,KLOR-CON) 10 MEQ tablet Take 1 tablet (10 mEq total) by mouth as needed (take with lasix only). 04/29/17  Yes Bensimhon, Shaune Pascal, MD  sacubitril-valsartan (ENTRESTO) 49-51 MG Take 1 tablet by mouth 2 (two) times daily. 08/31/18  Yes Bensimhon, Shaune Pascal, MD  spironolactone (ALDACTONE) 25 MG tablet Take 1 tablet (25 mg total) by mouth daily. 03/17/17  Yes Arbutus Leas, NP  vitamin B-12 (CYANOCOBALAMIN) 1000 MCG tablet Take 2,000 mcg by mouth daily.   Yes [provider]  aspirin EC 81 MG tablet Take 81 mg by mouth daily.    [provider]    Review of Systems:  Negative except as otherwise mentioned in  HPI.  Physical Exam: Vitals:   12/01/18 1130 12/01/18 1200 12/01/18 1322 12/01/18 1340  BP: (!) 144/105 (!) 149/113 (!) 145/103   Pulse: 89  93   Resp: (!) 23 (!) 29 20   Temp:   98.3 F (36.8 C)   TempSrc:   Oral   SpO2: 93%  97%   Weight:    84.5 kg  Height:    5\' 8"  (1.727 m)   Constitutional: Mild distress secondary to shortness of breath with minimal exertion.  Denies chest pain.  No nausea, no vomiting.  Patient expressed no fever or chills; poor appetite.   Eyes: PERRL, lids and conjunctivae normal, no icterus. ENMT: Mucous membranes are moist. Posterior pharynx clear of any exudate or lesions.  Neck: normal, supple, no masses, no thyromegaly, mild JVD appreciated on exam. Respiratory: Positive fine crackles at the bases, no wheezing; mild tachypnea.  Cardiovascular: Rate controlled, no rubs, no gallops. Trace edema bilaterally. Abdomen: no tenderness, no masses palpated. Positive distension and reported tightness. Positive Bowel sounds positive.  Musculoskeletal:  no clubbing / cyanosis. No joint deformity upper and lower extremities. Good ROM, no contractures. Normal muscle tone.  Skin: no rashes, lesions, ulcers. No induration Neurologic: CN 2-12 grossly intact. Sensation intact, DTR normal. Strength 5/5 in all 4.  Psychiatric: Normal judgment and insight. Alert and oriented x 3. Normal mood.    Labs on Admission: I have personally reviewed the following labs and imaging studies  CBC: Recent Labs  Lab 12/01/18 0652  WBC 7.8  HGB 13.2  HCT 40.6  MCV 100.0  PLT 194   Basic Metabolic Panel: Recent Labs  Lab 12/01/18 0652  NA 137  K 4.0  CL 103  CO2 21*  GLUCOSE 124*  BUN 18  CREATININE 1.27*  CALCIUM 9.0   GFR: Estimated Creatinine Clearance: 62.2 mL/min (A) (by C-G formula based on SCr of 1.27 mg/dL (H)).   Liver Function Tests: Recent Labs  Lab 12/01/18 0652  AST 30  ALT 100*  ALKPHOS 103  BILITOT 2.6*  PROT 8.2*  ALBUMIN 4.2   Cardiac Enzymes: Recent Labs  Lab 12/01/18 0652  TROPONINI 0.03*   Urine analysis:    Component Value Date/Time   COLORURINE YELLOW 05/04/2018 [place] 05/04/2018 1308   LABSPEC >1.046 (H) 05/04/2018 1308   PHURINE 5.0 05/04/2018 1308   GLUCOSEU NEGATIVE 05/04/2018 1308   HGBUR MODERATE (A) 05/04/2018 1308   BILIRUBINUR NEGATIVE 05/04/2018 1308   KETONESUR NEGATIVE 05/04/2018 1308   PROTEINUR 30 (A) 05/04/2018 1308   NITRITE NEGATIVE 05/04/2018 1308   LEUKOCYTESUR NEGATIVE 05/04/2018 1308   Radiological Exams on Admission: Dg Chest 2 View  Result Date: 12/01/2018 CLINICAL DATA:  Shortness of breath EXAM: CHEST - 2 VIEW COMPARISON:  Chest radiograph February 17, 2017 and chest CT May 04, 2018 FINDINGS: There is airspace consolidation in the left base with small left pleural effusion. There is mild atelectatic change in the medial right base. There is cardiomegaly with pulmonary venous hypertension. Pacemaker leads are attached to the right atrium and right ventricle.  No evident adenopathy. No pneumothorax. There is marked collapse of the L1 vertebral body, similar to previous study with mild retropulsion of bone into the canal at the L1 level. IMPRESSION: Airspace consolidation, likely representing pneumonia, in the left base with left pleural effusion. Mild right base atelectasis. Pulmonary vascular congestion evident without demonstrable interstitial edema. Pacemaker lead tips attached to right atrium and right ventricle. Chronic wedging of the  L1 vertebral body with retropulsion of bone into the anterior canal, grossly stable. Electronically Signed   By: Lowella Grip III M.D.   On: 12/01/2018 08:07    EKG: Independently reviewed. No acute ischemic changes seen. Chronic RBBB; borderline QT prolongation.  Assessment/Plan 1-Acute on chronic systolic HF (heart failure) (HCC) -In the setting of medication noncompliance and diet indiscretion -Patient reports no having his Lasix for the last 3-4 days after he went visiting some family members. -Reports feeling of abdominal tightness and ongoing shortness of breath (worse with exertion) -Elevated BNP and chest x-ray demonstrating vascular congestion -Patient reports improvement after IV Lasix given in the ED -Will place on telemetry bed -Continue IV Lasix as recommended by cardiology service -Will check TSH (see below) -There was mild elevation in his troponin (but chronically with elevated troponin; Most likely demand ischemia from heart failure exacerbation. -Follow daily weights, strict intake and output and low-sodium diet -Resume the use of beta-blocker and Entresto -Will hold Aldactone overnight while giving IV diuresis. -Patient denies chest pain currently. -Reassess volume status in a.m. and ifstable discharged home on daily lasix usage. (he was using PRN lasix prior to admission; but reported needing medication almost everyday) -last EF 10-15%; status post ICD  2-Shortness of breath -In the  setting of acute on chronic systolic heart failure -Patient is afebrile reports no cough -Chest x-ray demonstrating vascular congestion -Elevated BNP suggesting CHF exacerbation as cause for SOB -not needing O2 supplementation currently -Continue monitoring.  3-CAD (coronary artery disease) -Continue beta-blocker and statins  4-paroxysmal atrial fibrillation -Rate controlled -Continue pacerone, carvedilol and Eliquis. -Actively monitoring on telemetry  5-hyperlipidemia -Continue statins  6-essential hypertension -Blood pressure stable and well-controlled currently -continue current antihypertensive regimen.  7-hypothyroidism -Continue Synthroid -Will check TSH  8-GERD -continue PPI  9-anxiety -continue PRN xanax.  10-chronic kidney disease is stage II-III -Appears to be stable and overall at baseline -Will monitor closely electrolytes and renal function while actively diuresing him -Hold Aldactone overnight.    DVT prophylaxis: Chronically on Eliquis Code Status: Full code Family Communication: No family at bedside Disposition Plan: Hopefully discharge back home in the next 24 hours after stabilizing volume status and breathing. Consults called: Cardiology service (Dr. Harl Bowie) Admission status: Observation, length of stay less than 2 midnights; telemetry bed.   Time Spent: 60 minutes  Barton Dubois MD [facility] Pager [phone]  12/01/2018, 3:03 PM

## 2018-12-01 NOTE — ED Notes (Signed)
Pacemaker interogated °

## 2018-12-01 NOTE — Consult Note (Signed)
Cardiology Consultation:   Patient ID: Stephen Cardenas MRN: [id number]; DOB: 07/02/1956  Admit date: 12/01/2018 Date of Consult: 12/01/2018  Primary Care Provider: [facility] Primary Cardiologist: Dr Haroldine Laws CHF clinic Primary Electrophysiologist:  Thompson Grayer, MD    Patient Profile:   Stephen Cardenas is a 63 y.o. male with a hx of chronic systolic HF/CAD who is being seen today for the evaluation of SOB at the request of Dr Davonna Belling.  History of Present Illness:   Stephen Cardenas 63 yo male history of chronic systolic HF, ICM with multiple prior MIs, AICD, COPD, OSA on CPAP, HTN. He was thought to be a good VAD candidate but he turned down. Last coronary intervention 02/2017 received DES to LCX area of ISR, DES to RCA.    Presents with progressing SOB/DOE and abdominal distension since Monday. Reports was at his sisters house and did not have his lasix, usually takes prn when he feels his abdomen swelling. Denies any chest pain. Reports compliance with his other meds.    Clinic weight Jan 2020 89.4 kg ER weight 85 kg BNP >4500 K 4 Cr 1.27 BUN 18 WBC 7.8 Hgb 13.2 Plt 351 Dig level 0.9 Trop 0.03--> CXR airspace consolidation left base with left pleural effusion, possible pnuemonia. Pulm vascular congestion.  EKG SR nonspecific conduction delay  ER device interrogation: normal function, no arrhythmias, possible fluid accumationl based on optivol 01/2018 echo LVEF 15-20%,  02/2017 cath: Mid RCA-2 lesion, 80 %stenosed.  Mid RCA-1 lesion, 90 %stenosed.  Prox RCA-2 lesion, 50 %stenosed.  Prox RCA-1 lesion, 30 %stenosed.  Dist RCA lesion, 30 %stenosed.  2nd Mrg lesion, 95 %stenosed.  Mid LAD to Dist LAD lesion, 95 %stenosed.  Dist LAD lesion, 30 %stenosed.  2nd Diag lesion, 99 %stenosed.  There is severe left ventricular systolic dysfunction.   Findings:  Ao = 108/78 (91) LV = 108/20 RA =  6 RV = 54/9 PA = 57/33 (41) PCW = 19 Fick cardiac  output/index = 5.9/3.1 PVR = 3.7 WU SVR = 1150  Ao sat = 94% PA sat = 58%, 60%  Assessment: 1. Severe 3 v CAD with multiple areas of in-stent restenosis 2. Severe iCM with EF 15% in setting of previous large anterior MI 3. Relatively well-compensated hemodynamics after diuresis  Plan/Discussion:  He has severe 3vCAD with multiple areas of ISR. Likely not candidate for CABG. Will review options with interventional team. Hold IV lasix today. Begin po in am.      Past Medical History:  Diagnosis Date   AICD (automatic cardioverter/defibrillator) present 2014   CAD (coronary artery disease)    S/p multiple prior MIs/PCIs // amdx in 2014 with acute stent thrombosis c/b CGS // Admx to [facility] in 11/6268 2/2 a/c systolic HF >> [facility] 10/19/98: mLAD 95 ISR, dLAD 30, D2 99; OM2 95 ISR; pRCA 30/50, mRCA 90/80, dRCA 30 EF 15 >> min to no viability on Thallium study (not surgical candidate) - PCI>> 3.5 x 20 mm Promus DES to the LCx 3 x 38 mm Promus DES to the proximal mid RCA   Chronic systolic heart failure (HCC)    2/2 to ischemic CM - Echo 02/12/17:  Mild LVH, EF 15-20, mod to severe MR, severe LAE, mild RVE, mod reduced RVSF, mild RAE, mod TR, PASP 65   COPD (chronic obstructive pulmonary disease) (HCC)    History of MI (myocardial infarction)    History of pneumonia    HTN (hypertension)    Hyperlipidemia 03/08/2017  Hypothyroidism    OSA (obstructive sleep apnea) 03/08/2017    Past Surgical History:  Procedure Laterality Date   CARDIAC DEFIBRILLATOR PLACEMENT  2014   Medtronic Every XT DR ICD implanted at the [facility] for primary prevention   CARDIOVERSION N/A 03/09/2018   Procedure: CARDIOVERSION;  Surgeon: Jolaine Artist, MD;  Location: [facility] ENDOSCOPY;  Service: Cardiovascular;  Laterality: N/A;   CORONARY STENT INTERVENTION N/A 02/22/2017   Procedure: Coronary Stent Intervention;  Surgeon: Sherren Mocha, MD;  Location: [place] CV LAB;  Service: Cardiovascular;   Laterality: N/A;  CFX and RCA   FACIAL RECONSTRUCTION SURGERY Right 1984   FEMUR FRACTURE SURGERY Right 1984   RIGHT/LEFT HEART CATH AND CORONARY ANGIOGRAPHY N/A 02/14/2017   Procedure: Right/Left Heart Cath and Coronary Angiography;  Surgeon: Jolaine Artist, MD;  Location: [place] CV LAB;  Service: Cardiovascular;  Laterality: N/A;   WRIST FRACTURE SURGERY Right 2015     Inpatient Medications: Scheduled Meds:  Continuous Infusions:  PRN Meds:   Allergies:    Allergies  Allergen Reactions   Codeine Other (See Comments) and Nausea Only    Increased work of breathing, diaphoretic   Mirtazapine     Other reaction(s): Other - See Comments    Social History:   Social History   Socioeconomic History   Marital status: Widowed    Spouse name: Not on file   Number of children: Not on file   Years of education: Not on file   Highest education level: Not on file  Occupational History   Not on file  Social Needs   Financial resource strain: Not on file   Food insecurity:    Worry: Not on file    Inability: Not on file   Transportation needs:    Medical: Not on file    Non-medical: Not on file  Tobacco Use   Smoking status: Former Smoker    Packs/day: 1.00    Years: 28.00    Pack years: 28.00    Types: Cigarettes    Start date: 1968    Last attempt to quit: 1996    Years since quitting: 24.3   Smokeless tobacco: Never Used  Substance and Sexual Activity   Alcohol use: No   Drug use: No   Sexual activity: Not on file  Lifestyle   Physical activity:    Days per week: Not on file    Minutes per session: Not on file   Stress: Not on file  Relationships   Social connections:    Talks on phone: Not on file    Gets together: Not on file    Attends religious service: Not on file    Active member of club or organization: Not on file    Attends meetings of clubs or organizations: Not on file    Relationship status: Not on file   Intimate  partner violence:    Fear of current or ex partner: Not on file    Emotionally abused: Not on file    Physically abused: Not on file    Forced sexual activity: Not on file  Other Topics Concern   Not on file  Social History Narrative   Lives in [place]   Attends [employer]   Disabled carpenter    Family History:    Family History  Problem Relation Age of Onset   Diabetes Mother    Heart disease Mother    Stroke Mother    Heart attack  Mother    Heart disease Father    Heart attack Father    Stroke Father    COPD Sister    Congestive Heart Failure Sister    Hypertension Sister    Diabetes Sister    Hypertension Brother    Diabetes Sister    Hypertension Sister    Diabetes Sister    Hypertension Sister      ROS:  Please see the history of present illness.  All other ROS reviewed and negative.     Physical Exam/Data:   Vitals:   12/01/18 0830 12/01/18 0900 12/01/18 0930 12/01/18 1000  BP: (!) 157/118 (!) 158/115 (!) 165/105 (!) 154/115  Pulse: 91 92 90 91  Resp: (!) 24 (!) 31 (!) 29 (!) 34  Temp:      SpO2: 92% 94% 93% 92%  Weight:        Intake/Output Summary (Last 24 hours) at 12/01/2018 1030 Last data filed at 12/01/2018 1006 Gross per 24 hour  Intake --  Output 210 ml  Net -210 ml   Last 3 Weights 12/01/2018 08/31/2018 07/31/2018  Weight (lbs) 187 lb 9.6 oz 197 lb 198 lb  Weight (kg) 85.095 kg 89.359 kg 89.812 kg     Body mass index is 29.16 kg/m.  General:  Well nourished, well developed, in no acute distress HEENT: normal Lymph: no adenopathy Neck: elevated JVD Endocrine:  No thryomegaly  Cardiac:  normal S1, S2; RRR; no murmur  Lungs:  clear to auscultation bilaterally, no wheezing, rhonchi or rales  Abd: soft, nontender, no hepatomegaly  Ext: no edema Musculoskeletal:  No deformities, BUE and BLE strength normal and equal Skin: warm and dry  Neuro:  CNs 2-12 intact, no focal abnormalities noted Psych:  Normal  affect     Laboratory Data:  Chemistry Recent Labs  Lab 12/01/18 0652  NA 137  K 4.0  CL 103  CO2 21*  GLUCOSE 124*  BUN 18  CREATININE 1.27*  CALCIUM 9.0  GFRNONAA 59*  GFRAA >60  ANIONGAP 13    Recent Labs  Lab 12/01/18 0652  PROT 8.2*  ALBUMIN 4.2  AST 30  ALT 100*  ALKPHOS 103  BILITOT 2.6*   Hematology Recent Labs  Lab 12/01/18 0652  WBC 7.8  RBC 4.06*  HGB 13.2  HCT 40.6  MCV 100.0  MCH 32.5  MCHC 32.5  RDW 16.7*  PLT 351   Cardiac Enzymes Recent Labs  Lab 12/01/18 0652  TROPONINI 0.03*   No results for input(s): TROPIPOC in the last 168 hours.  BNP Recent Labs  Lab 12/01/18 0652  BNP >4,500.0*    DDimer No results for input(s): DDIMER in the last 168 hours.  Radiology/Studies:  Dg Chest 2 View  Result Date: 12/01/2018 CLINICAL DATA:  Shortness of breath EXAM: CHEST - 2 VIEW COMPARISON:  Chest radiograph February 17, 2017 and chest CT May 04, 2018 FINDINGS: There is airspace consolidation in the left base with small left pleural effusion. There is mild atelectatic change in the medial right base. There is cardiomegaly with pulmonary venous hypertension. Pacemaker leads are attached to the right atrium and right ventricle. No evident adenopathy. No pneumothorax. There is marked collapse of the L1 vertebral body, similar to previous study with mild retropulsion of bone into the canal at the L1 level. IMPRESSION: Airspace consolidation, likely representing pneumonia, in the left base with left pleural effusion. Mild right base atelectasis. Pulmonary vascular congestion evident without demonstrable interstitial edema. Pacemaker lead tips attached  to right atrium and right ventricle. Chronic wedging of the L1 vertebral body with retropulsion of bone into the anterior canal, grossly stable. Electronically Signed   By: Lowella Grip III M.D.   On: 12/01/2018 08:07    Assessment and Plan:   1. Acute on chronic systolic HF - usually takes lasix  prn, did not have with him where he was staying and progressive abdominal distension and SOB/DOE over the last few days. No specific chest pain.  - from clinic notes medical therapy limited by hypotension - has previously refused VAD, deemed not a transplant candidate - weights misleading, recent GI illness with overall weight loss. Unclear what his baseline is. - ICD check shows normal function, no arrhythmias. Optivol suggests fluid accumulation  In ER has received IV lasix 40mg  x 1, will repeat dose later today. Would reassess volume status tomorrow. Was not diuretic failure at home and therefore would not adjust dose, would resume his prn lasix at discharge.  - continue other CHF meds   2. CAD - - Anterior wall is non-viable given previous LAD stent thrombosis. 24-hour Thallium viability study showed minimal or no viability in all segments except for septum - off antiplatelet agets since starting eliquis - no active issues, continue home regimen   3. PAF - - s/p  DC-CV 03/09/18.  - on amiodarone, coreg, eliquis - SR today, continue current meds     For questions or updates, please contact [place] Please consult [email or web address] for contact info under     Signed, [name], MD  12/01/2018 10:30 AM

## 2018-12-01 NOTE — Progress Notes (Signed)
Patient declined instruction on use of IS at this time stating he was very sleepy and wanted to rest. He stated he had one a while back. RT will return tomorrow to instruct and have patient demonstrate his understanding with the IS.

## 2018-12-02 DIAGNOSIS — K219 Gastro-esophageal reflux disease without esophagitis: Secondary | ICD-10-CM | POA: Diagnosis not present

## 2018-12-02 DIAGNOSIS — E785 Hyperlipidemia, unspecified: Secondary | ICD-10-CM | POA: Diagnosis not present

## 2018-12-02 DIAGNOSIS — I5023 Acute on chronic systolic (congestive) heart failure: Secondary | ICD-10-CM | POA: Diagnosis not present

## 2018-12-02 DIAGNOSIS — I2510 Atherosclerotic heart disease of native coronary artery without angina pectoris: Secondary | ICD-10-CM | POA: Diagnosis not present

## 2018-12-02 DIAGNOSIS — I480 Paroxysmal atrial fibrillation: Secondary | ICD-10-CM | POA: Diagnosis not present

## 2018-12-02 DIAGNOSIS — I10 Essential (primary) hypertension: Secondary | ICD-10-CM | POA: Diagnosis not present

## 2018-12-02 LAB — BASIC METABOLIC PANEL
Anion gap: 13 (ref 5–15)
BUN: 24 mg/dL — ABNORMAL HIGH (ref 8–23)
CO2: 20 mmol/L — ABNORMAL LOW (ref 22–32)
Calcium: 8.7 mg/dL — ABNORMAL LOW (ref 8.9–10.3)
Chloride: 104 mmol/L (ref 98–111)
Creatinine, Ser: 1.24 mg/dL (ref 0.61–1.24)
GFR calc Af Amer: >60 mL/min (ref >60)
GFR calc non Af Amer: >60 mL/min (ref >60)
Glucose, Bld: 102 mg/dL — ABNORMAL HIGH (ref 70–99)
Potassium: 4 mmol/L (ref 3.5–5.1)
Sodium: 137 mmol/L (ref 135–145)

## 2018-12-02 MED ORDER — ZOLPIDEM TARTRATE 5 MG PO TABS
5.0000 mg | ORAL_TABLET | Freq: Once | ORAL | Status: AC | PRN
  Administered 2018-12-02: 02:00:00 5 mg via ORAL
  Filled 2018-12-02: qty 1

## 2018-12-02 MED ORDER — FUROSEMIDE 20 MG PO TABS
30.0000 mg | ORAL_TABLET | Freq: Every day | ORAL | Status: DC
  Administered 2018-12-02 – 2018-12-03 (×2): 30 mg via ORAL
  Filled 2018-12-02 (×2): qty 2

## 2018-12-02 MED ORDER — LEVOTHYROXINE SODIUM 100 MCG PO TABS
200.0000 ug | ORAL_TABLET | Freq: Every day | ORAL | Status: DC
  Administered 2018-12-03: 05:00:00 200 ug via ORAL
  Filled 2018-12-02: qty 2

## 2018-12-02 MED ORDER — RAMELTEON 8 MG PO TABS: 8.0000 mg | ORAL_TABLET | Freq: Once | ORAL | Status: DC | PRN

## 2018-12-02 MED ORDER — SPIRONOLACTONE 25 MG PO TABS
25.0000 mg | ORAL_TABLET | Freq: Every day | ORAL | Status: DC
  Administered 2018-12-02 – 2018-12-03 (×2): 25 mg via ORAL
  Filled 2018-12-02 (×2): qty 1

## 2018-12-02 NOTE — Progress Notes (Signed)
PROGRESS NOTE    Stephen Cardenas  KXF:[id number] DOB: 04-30-1956 DOA: 12/01/2018 [facility]     Brief Narrative:  63 y.o. male with a past medical history significant for coronary artery disease, chronic systolic heart failure (ejection fraction 10-15%), paroxysmal atrial fibrillation, hypertension, chronic kidney disease is stage II-III, hyperlipidemia, hypothyroidism and gastroesophageal reflux disease; who presented to the hospital secondary to increased shortness of breath, dyspnea on exertion and abdominal tightness.  Patient reports visiting family members and no having his Lasix with pain for the last 3-4 days prior to admission.  Patient also expressed some diet indiscretion.  Patient denies any chest pain, no fever, no chills, no cough; no hemoptysis, no melena, no hematuria, no dysuria, no nausea, no vomiting, no focal weakness.  Patient expressed good compliance with the rest of his medications. In the ED chest x-ray demonstrated vascular congestion without frank pulmonary edema.  BNP was significantly elevated and the patient was found short of breath with positive orthopnea during evaluation. Troponin mildly elevated at 0.03 but no acute ischemic changes on EKG.  Hemoglobin 13.2 WBCs 7.8 creatinine 1.2 (patient chronically with chronic kidney disease stage II-III).  Cardiology was consulted, IV Lasix 40 mg IV given in the ED and TRH contacted to place in observation    Assessment & Plan: 1-acute on chronic systolic heart failure -Patient reports improvement in his breathing and hearing output -Less tightness in his abdomen has been described today. -Will transition diuretics to oral regimen and resume Aldactone. -Continue to follow daily weights, low-sodium diet and strict input and output -Continue beta-blocker and Entresto.  2-hypothyroidism -TSH with ADA -Improved from 62.5 about a month ago -At this moment will increase Synthroid to 200 mcg daily -Patient  is still symptomatic and over 8 weeks of treatment still not at goal.  3-coronary artery disease -No chest pain -Continue beta-blocker and statins.  4-paroxysmal atrial fibrillation -Rate controlled -Continue Pacerone, carvedilol and Eliquis  5-hyperlipidemia -Continue statins  6-essential hypertension -Blood pressure has remained stable control -Continue current antihypertensive regimen -Heart healthy diet has been ordered.  7-gastroesophageal reflux disease -Continue PPI  8-anxiety/insomnia -Continue PRN Xanax.  9-chronic kidney disease is stage II-III -Creatinine has remained stable overall at baseline -Continue to monitor on daily basis while acutely providing diuresis.  DVT prophylaxis: eliquis. Code Status: Full code Family Communication: No family at bedside. Disposition Plan: Given improvement in his breathing will transition diuretics to oral regimen, adjust Synthroid medication and follow urine output/daily weights.  Hopefully discharge home in the next 24 hours.  Consultants:   Cardiology  Procedures:   See below for x-ray report.  Antimicrobials:  Anti-infectives (From admission, onward)   None     Subjective: Afebrile, reporting feeling tired and weak; no orthopnea.  Breathing overall improved.  There is no nausea, no vomiting, no chest pain, no palpitations.  Objective: Vitals:   12/01/18 2133 12/02/18 0500 12/02/18 0528 12/02/18 1519  BP: (!) 142/102  (!) 145/105 (!) 126/95  Pulse: 79  84 85  Resp: 20  20   Temp: (!) 97.4 F (36.3 C)  97.9 F (36.6 C) 98 F (36.7 C)  TempSrc: Oral  Oral Oral  SpO2: 91%  93% 94%  Weight:  82.8 kg    Height:        Intake/Output Summary (Last 24 hours) at 12/02/2018 1658 Last data filed at 12/02/2018 1336 Gross per 24 hour  Intake 720 ml  Output 1250 ml  Net -530 ml   [employer]  12/01/18 0731 12/01/18 1340 12/02/18 0500  Weight: 85.1 kg 84.5 kg 82.8 kg    Examination: General exam: Alert,  awake, oriented x 3; no chest pain, no fever, no nausea, no vomiting.  Patient expressed having chills, feeling significantly weak and very tired.  No palpitations.  Overall breathing better and no requiring oxygen supplementation. Respiratory system: No frank crackles on exam, improved air movement bilaterally; normal respiratory effort.  Cardiovascular system:RRR. No murmurs, rubs, gallops. Gastrointestinal system: Abdomen is nondistended, soft and nontender. No organomegaly or masses felt. Normal bowel sounds heard. Central nervous system: Alert and oriented. No focal neurological deficits. Extremities: No cyanosis or clubbing. Skin: No rashes, lesions or ulcers Psychiatry: Judgement and insight appear normal.  Flat affect depressed mood.  Data Reviewed: I have personally reviewed following labs and imaging studies  CBC: Recent Labs  Lab 12/01/18 0652  WBC 7.8  HGB 13.2  HCT 40.6  MCV 100.0  PLT 793   Basic Metabolic Panel: Recent Labs  Lab 12/01/18 0652 12/02/18 0631  NA 137 137  K 4.0 4.0  CL 103 104  CO2 21* 20*  GLUCOSE 124* 102*  BUN 18 24*  CREATININE 1.27* 1.24  CALCIUM 9.0 8.7*  MG 2.2  --    GFR: Estimated Creatinine Clearance: 63.2 mL/min (by C-G formula based on SCr of 1.24 mg/dL).   Liver Function Tests: Recent Labs  Lab 12/01/18 0652  AST 30  ALT 100*  ALKPHOS 103  BILITOT 2.6*  PROT 8.2*  ALBUMIN 4.2   Cardiac Enzymes: Recent Labs  Lab 12/01/18 0652  TROPONINI 0.03*   Thyroid Function Tests: Recent Labs    12/01/18 0652  TSH 38.051*   Urine analysis:    Component Value Date/Time   COLORURINE YELLOW 05/04/2018 1308   APPEARANCEUR CLEAR 05/04/2018 1308   LABSPEC >1.046 (H) 05/04/2018 1308   PHURINE 5.0 05/04/2018 1308   GLUCOSEU NEGATIVE 05/04/2018 1308   HGBUR MODERATE (A) 05/04/2018 1308   BILIRUBINUR NEGATIVE 05/04/2018 1308   KETONESUR NEGATIVE 05/04/2018 1308   PROTEINUR 30 (A) 05/04/2018 1308   NITRITE NEGATIVE 05/04/2018  1308   LEUKOCYTESUR NEGATIVE 05/04/2018 1308   Radiology Studies: Dg Chest 2 View  Result Date: 12/01/2018 CLINICAL DATA:  Shortness of breath EXAM: CHEST - 2 VIEW COMPARISON:  Chest radiograph February 17, 2017 and chest CT May 04, 2018 FINDINGS: There is airspace consolidation in the left base with small left pleural effusion. There is mild atelectatic change in the medial right base. There is cardiomegaly with pulmonary venous hypertension. Pacemaker leads are attached to the right atrium and right ventricle. No evident adenopathy. No pneumothorax. There is marked collapse of the L1 vertebral body, similar to previous study with mild retropulsion of bone into the canal at the L1 level. IMPRESSION: Airspace consolidation, likely representing pneumonia, in the left base with left pleural effusion. Mild right base atelectasis. Pulmonary vascular congestion evident without demonstrable interstitial edema. Pacemaker lead tips attached to right atrium and right ventricle. Chronic wedging of the L1 vertebral body with retropulsion of bone into the anterior canal, grossly stable. Electronically Signed   By: Lowella Grip III M.D.   On: 12/01/2018 08:07    Scheduled Meds: . amiodarone  200 mg Oral Daily  . apixaban  5 mg Oral BID  . atorvastatin  20 mg Oral QHS  . carvedilol  3.125 mg Oral BID WC  . cholecalciferol  2,000 Units Oral Daily  . digoxin  0.125 mg Oral Daily  . DULoxetine  90 mg Oral QHS  . furosemide  30 mg Oral Daily  . levothyroxine  175 mcg Oral Q0600  . pantoprazole  40 mg Oral QHS  . sacubitril-valsartan  1 tablet Oral BID  . sodium chloride flush  3 mL Intravenous Q12H  . spironolactone  25 mg Oral Daily  . vitamin B-12  2,000 mcg Oral Daily   Continuous Infusions: . sodium chloride       LOS: 0 days    Time spent: 30 minutes   Barton Dubois, MD [facility] Pager [phone]  12/02/2018, 4:58 PM

## 2018-12-02 NOTE — Plan of Care (Signed)
  Problem: Activity: Goal: Risk for activity intolerance will decrease Outcome: Progressing   Problem: Nutrition: Goal: Adequate nutrition will be maintained Outcome: Progressing   Problem: Coping: Goal: Level of anxiety will decrease Outcome: Progressing   Problem: Elimination: Goal: Will not experience complications related to bowel motility Outcome: Progressing   Problem: Elimination: Goal: Will not experience complications related to bowel motility Outcome: Progressing Goal: Will not experience complications related to urinary retention Outcome: Progressing   Problem: Safety: Goal: Ability to remain free from injury will improve Outcome: Adequate for Discharge   Problem: Skin Integrity: Goal: Risk for impaired skin integrity will decrease Outcome: Adequate for Discharge

## 2018-12-03 DIAGNOSIS — F419 Anxiety disorder, unspecified: Secondary | ICD-10-CM

## 2018-12-03 DIAGNOSIS — I5023 Acute on chronic systolic (congestive) heart failure: Secondary | ICD-10-CM | POA: Diagnosis not present

## 2018-12-03 DIAGNOSIS — F329 Major depressive disorder, single episode, unspecified: Secondary | ICD-10-CM

## 2018-12-03 DIAGNOSIS — I480 Paroxysmal atrial fibrillation: Secondary | ICD-10-CM | POA: Diagnosis not present

## 2018-12-03 DIAGNOSIS — R0602 Shortness of breath: Secondary | ICD-10-CM | POA: Diagnosis not present

## 2018-12-03 DIAGNOSIS — I10 Essential (primary) hypertension: Secondary | ICD-10-CM | POA: Diagnosis not present

## 2018-12-03 DIAGNOSIS — E785 Hyperlipidemia, unspecified: Secondary | ICD-10-CM | POA: Diagnosis not present

## 2018-12-03 DIAGNOSIS — N183 Chronic kidney disease, stage 3 unspecified: Secondary | ICD-10-CM

## 2018-12-03 DIAGNOSIS — F32A Depression, unspecified: Secondary | ICD-10-CM

## 2018-12-03 DIAGNOSIS — I509 Heart failure, unspecified: Secondary | ICD-10-CM

## 2018-12-03 LAB — BASIC METABOLIC PANEL
Anion gap: 12 (ref 5–15)
BUN: 27 mg/dL — ABNORMAL HIGH (ref 8–23)
CO2: 22 mmol/L (ref 22–32)
Calcium: 8.8 mg/dL — ABNORMAL LOW (ref 8.9–10.3)
Chloride: 102 mmol/L (ref 98–111)
Creatinine, Ser: 1.25 mg/dL — ABNORMAL HIGH (ref 0.61–1.24)
GFR calc Af Amer: >60 mL/min (ref >60)
GFR calc non Af Amer: >60 mL/min (ref >60)
Glucose, Bld: 102 mg/dL — ABNORMAL HIGH (ref 70–99)
Potassium: 3.8 mmol/L (ref 3.5–5.1)
Sodium: 136 mmol/L (ref 135–145)

## 2018-12-03 MED ORDER — FUROSEMIDE 20 MG PO TABS: 30.0000 mg | ORAL_TABLET | Freq: Every day | ORAL | 1 refills | Status: DC

## 2018-12-03 MED ORDER — LEVOTHYROXINE SODIUM 200 MCG PO TABS: 200.0000 ug | ORAL_TABLET | Freq: Every day | ORAL | 1 refills | Status: AC

## 2018-12-03 MED ORDER — POTASSIUM CHLORIDE CRYS ER 10 MEQ PO TBCR: 10.0000 meq | EXTENDED_RELEASE_TABLET | Freq: Every day | ORAL | 1 refills | Status: DC

## 2018-12-03 NOTE — Discharge Summary (Signed)
Physician Discharge Summary  Stephen Cardenas FYB:[id number] DOB: 1955/12/08 DOA: 12/01/2018  [facility]  Admit date: 12/01/2018 Discharge date: 12/03/2018  Time spent: 35 minutes  Recommendations for Outpatient Follow-up:  1. Repeat basic metabolic panel to follow electrolytes and renal function 2. Repeat thyroid panel in 6 weeks with further adjustment to Synthroid medication as needed 3. Reassess blood pressure and adjust antihypertensive medication as required.   Discharge Diagnoses:  Principal Problem:   Acute on chronic systolic HF (heart failure) (HCC) Active Problems:   Shortness of breath   CAD (coronary artery disease)   Essential hypertension   Hyperlipidemia   PAF (paroxysmal atrial fibrillation) (HCC)   Acute on chronic congestive heart failure (HCC)   Chronic renal disease, stage III (HCC)   Anxiety and depression   Discharge Condition: Stable and improved.  Patient discharged home with instruction to follow-up with PCP in 1 week and to follow-up with cardiology service as previously instructed.  Diet recommendation: Heart healthy/low-sodium diet  Filed Weights   12/01/18 1340 12/02/18 0500 12/03/18 0544  Weight: 84.5 kg 82.8 kg 83.3 kg    History of present illness:  63 y.o.malewith a past medical history significant for coronary artery disease, chronic systolic heart failure (ejection fraction 10-15%), paroxysmal atrial fibrillation, hypertension, chronic kidney disease is stage II-III, hyperlipidemia, hypothyroidism and gastroesophageal reflux disease; who presented to the hospital secondary to increased shortness of breath, dyspnea on exertion and abdominal tightness.Patient reports visiting family members and no having his Lasix with pain for the last 3-4 days prior to admission. Patient also expressed some diet indiscretion. Patient denies any chest pain, no fever, no chills, no cough; no hemoptysis, no melena, no hematuria, no dysuria,  no nausea, no vomiting, no focal weakness. Patient expressed good compliance with the rest of his medications. In the ED chest x-ray demonstrated vascular congestion without frank pulmonary edema. BNP was significantly elevated and the patient was found short of breath with positive orthopnea during evaluation. Troponin mildly elevated at 0.03 but no acute ischemic changes on EKG. Hemoglobin 13.2 WBCs 7.8 creatinine 1.2 (patient chronically with chronic kidney disease stage II-III). Cardiology was consulted, IV Lasix 40 mg IV given in the ED and TRH contacted to place in observation   Hospital Course:  1-acute on chronic systolic heart failure -Good urine output on current regimen appreciated. -Less tightness in his abdomen has been described; patient also expressed no shortness of breath, no orthopnea and feeling ready for discharge.  -Will discharge on 30 mg of Lasix on daily basis and resumption of Aldactone. -Continue to follow daily weights and low-sodium diet -Continue beta-blocker and Entresto.  2-hypothyroidism -TSH Improved from 62.5 about a month ago to 38.5 -At this moment will increase Synthroid to 200 mcg daily -Patient is still symptomatic and over 8 weeks of treatment still not at goal. -Repeat thyroid panel in 6 weeks.  3-coronary artery disease -No chest pain. -Continue beta-blocker and statins.  4-paroxysmal atrial fibrillation -Rate controlled. -Continue Pacerone, carvedilol and Eliquis  5-hyperlipidemia -Continue statins  6-essential hypertension -Blood pressure has remained stable control -Continue current antihypertensive regimen -Heart healthy diet has been encouraged.    7-gastroesophageal reflux disease -Continue PPI  8-anxiety/insomnia -Continue PRN Xanax.  9-chronic kidney disease is stage II-III -Creatinine has remained stable overall at baseline -Continue to monitor on daily basis while acutely providing diuresis.  Procedures:  See  below for x-ray reports  Consultations:  Cardiology  Discharge Exam: Vitals:   12/03/18 0544 12/03/18 0756  BP: Marland Kitchen)  144/95   Pulse: 76   Resp: 20   Temp:    SpO2: 94% 95%   General exam: Alert, awake, oriented x 3; no chest pain, no fever, no nausea, no vomiting.  Denies orthopnea or shortness of breath.  Overall feeling better and improved.  Still generalized weakness, chills and fatigue reported.  No palpitations Respiratory system: No frank crackles on exam, improved air movement bilaterally; normal respiratory effort.  Cardiovascular system:Rate controlled; No murmurs, rubs or gallops. Gastrointestinal system: Abdomen is nondistended, soft and nontender. No organomegaly or masses felt. Normal bowel sounds heard. Central nervous system: Alert and oriented. No focal neurological deficits. Extremities: No cyanosis or clubbing. Skin: No rashes, lesions or ulcers Psychiatry: Judgement and insight appear normal.  Flat affect, depressed mood.   Discharge Instructions   Discharge Instructions    (HEART FAILURE PATIENTS) Call MD:  Anytime you have any of the following symptoms: 1) 3 pound weight gain in 24 hours or 5 pounds in 1 week 2) shortness of breath, with or without a dry hacking cough 3) swelling in the hands, feet or stomach 4) if you have to sleep on extra pillows at night in order to breathe.   Complete by:  As directed    Diet - low sodium heart healthy   Complete by:  As directed    Discharge instructions   Complete by:  As directed    Take medications as prescribed Follow low-sodium diet (less than 2 g daily) Check your weight on daily basis Arrange follow-up with PCP in 10 days Follow-up with cardiology service as previously instructed Keep in mind that your Synthroid medication dose has been adjusted and that you will be using 30 mg of Lasix on daily basis.     Allergies as of 12/03/2018      Reactions   Codeine Other (See Comments), Nausea Only   Increased  work of breathing, diaphoretic   Mirtazapine    Other reaction(s): Other - See Comments      Medication List    TAKE these medications   ALPRAZolam 0.5 MG tablet Commonly known as:  XANAX Take 0.5-1 mg by mouth at bedtime.   amiodarone 200 MG tablet Commonly known as:  PACERONE 1  TABLET  ONCE  DAILY   apixaban 5 MG Tabs tablet Commonly known as:  Eliquis Take 1 tablet (5 mg total) by mouth 2 (two) times daily.   aspirin EC 81 MG tablet Take 81 mg by mouth daily.   atorvastatin 20 MG tablet Commonly known as:  LIPITOR Take 20 mg by mouth at bedtime.   carvedilol 3.125 MG tablet Commonly known as:  COREG Take 1 tablet (3.125 mg total) by mouth 2 (two) times daily with a meal.   digoxin 0.125 MG tablet Commonly known as:  LANOXIN Take 1 tablet (0.125 mg total) by mouth daily.   DULoxetine 30 MG capsule Commonly known as:  CYMBALTA Take 90 mg by mouth at bedtime.   furosemide 20 MG tablet Commonly known as:  LASIX Take 1.5 tablets (30 mg total) by mouth daily. Start taking on:  December 04, 2018 What changed:    medication strength  how much to take  how to take this  when to take this  additional instructions   Iron 325 (65 Fe) MG Tabs TAKE 1 TABLET BY MOUTH TWICE DAILY WITH A MEAL   levothyroxine 200 MCG tablet Commonly known as:  SYNTHROID Take 1 tablet (200 mcg total) by mouth daily at  6 (six) AM. Start taking on:  December 04, 2018 What changed:    medication strength  how much to take  when to take this   MULTIVITAMIN PO Take 1 tablet by mouth daily.   pantoprazole 40 MG tablet Commonly known as:  PROTONIX Take 1 tablet (40 mg total) by mouth at bedtime.   potassium chloride 10 MEQ tablet Commonly known as:  K-DUR Take 1 tablet (10 mEq total) by mouth daily. What changed:    when to take this  reasons to take this   sacubitril-valsartan 49-51 MG Commonly known as:  ENTRESTO Take 1 tablet by mouth 2 (two) times daily.    spironolactone 25 MG tablet Commonly known as:  ALDACTONE Take 1 tablet (25 mg total) by mouth daily.   vitamin B-12 1000 MCG tablet Commonly known as:  CYANOCOBALAMIN Take 2,000 mcg by mouth daily.   Vitamin D3 50 MCG (2000 UT) Tabs Take 2,000 Units by mouth daily.      Allergies  Allergen Reactions  . Codeine Other (See Comments) and Nausea Only    Increased work of breathing, diaphoretic  . Mirtazapine     Other reaction(s): Other - See Comments   Follow-up Information    [facility] Schedule an appointment as soon as possible for a visit in 1 week(s).   Contact information: [place] [place] [address] [phone]        Thompson Grayer, MD .   Specialty:  Cardiology Contact information: [place] [address] [phone]           The results of significant diagnostics from this hospitalization (including imaging, microbiology, ancillary and laboratory) are listed below for reference.    Significant Diagnostic Studies: Dg Chest 2 View  Result Date: 12/01/2018 CLINICAL DATA:  Shortness of breath EXAM: CHEST - 2 VIEW COMPARISON:  Chest radiograph February 17, 2017 and chest CT May 04, 2018 FINDINGS: There is airspace consolidation in the left base with small left pleural effusion. There is mild atelectatic change in the medial right base. There is cardiomegaly with pulmonary venous hypertension. Pacemaker leads are attached to the right atrium and right ventricle. No evident adenopathy. No pneumothorax. There is marked collapse of the L1 vertebral body, similar to previous study with mild retropulsion of bone into the canal at the L1 level. IMPRESSION: Airspace consolidation, likely representing pneumonia, in the left base with left pleural effusion. Mild right base atelectasis. Pulmonary vascular congestion evident without demonstrable interstitial edema. Pacemaker lead tips attached to right atrium and right ventricle.  Chronic wedging of the L1 vertebral body with retropulsion of bone into the anterior canal, grossly stable. Electronically Signed   By: Lowella Grip III M.D.   On: 12/01/2018 08:07    Microbiology: No results found for this or any previous visit (from the past 240 hour(s)).   Labs: Basic Metabolic Panel: Recent Labs  Lab 12/01/18 0652 12/02/18 0631 12/03/18 0608  NA 137 137 136  K 4.0 4.0 3.8  CL 103 104 102  CO2 21* 20* 22  GLUCOSE 124* 102* 102*  BUN 18 24* 27*  CREATININE 1.27* 1.24 1.25*  CALCIUM 9.0 8.7* 8.8*  MG 2.2  --   --    Liver Function Tests: Recent Labs  Lab 12/01/18 0652  AST 30  ALT 100*  ALKPHOS 103  BILITOT 2.6*  PROT 8.2*  ALBUMIN 4.2   CBC: Recent Labs  Lab 12/01/18 0652  WBC 7.8  HGB  13.2  HCT 40.6  MCV 100.0  PLT 351   Cardiac Enzymes: Recent Labs  Lab 12/01/18 0652  TROPONINI 0.03*   BNP: BNP (last 3 results) Recent Labs    02/01/18 1556 08/31/18 1144 12/01/18 0652  BNP 2,093.1* 841.1* >4,500.0*   Signed:  Barton Dubois MD.  [facility] 12/03/2018, 2:52 PM

## 2018-12-05 ENCOUNTER — Other Ambulatory Visit: Payer: Self-pay

## 2018-12-05 ENCOUNTER — Ambulatory Visit (HOSPITAL_COMMUNITY)
Admission: RE | Admit: 2018-12-05 | Discharge: 2018-12-05 | Disposition: A | Discharge status: Home / Self Care | Payer: Medicare Other | Source: Ambulatory Visit | Attending: Adult Health | Admitting: Adult Health

## 2018-12-05 ENCOUNTER — Encounter (HOSPITAL_COMMUNITY): Payer: Self-pay

## 2018-12-05 VITALS — Wt 179.0 lb

## 2018-12-05 DIAGNOSIS — I5022 Chronic systolic (congestive) heart failure: Secondary | ICD-10-CM

## 2018-12-05 DIAGNOSIS — I480 Paroxysmal atrial fibrillation: Secondary | ICD-10-CM | POA: Diagnosis not present

## 2018-12-05 DIAGNOSIS — I255 Ischemic cardiomyopathy: Secondary | ICD-10-CM | POA: Diagnosis not present

## 2018-12-05 MED ORDER — DIGOXIN 125 MCG PO TABS: 0.1250 mg | ORAL_TABLET | ORAL | 3 refills | Status: DC

## 2018-12-05 NOTE — Addendum Note (Signed)
Encounter addended by: Marlise Eves, RN on: 12/05/2018 1:12 PM  Actions taken: Order list changed, Clinical Note Signed

## 2018-12-05 NOTE — Patient Instructions (Signed)
Change digoxin to 0.125 mg every other day.   Please weight and record daily.    Recommended follow-up:  Follow up in 2 months

## 2018-12-05 NOTE — Progress Notes (Signed)
Heart Failure TeleHealth Note  Due to national recommendations of social distancing due to [place] 19, Audio/video telehealth visit is felt to be most appropriate for this patient at this time.  See MyChart message from today for patient consent regarding telehealth for [facility].  Date:  12/05/2018   ID:  Stephen Cardenas, DOB Dec 06, 1955, MRN [id number]  Location: Home  Provider location: [place] [facility] Type of Visit: Established patient   PCP:  Administration, [place]  Cardiologist:  No primary care provider on file. Primary HF: Dr Haroldine Laws   Chief Complaint: Heart Failure   History of Present Illness: Stephen Cardenas is a 63 y.o. male with a history of chronic systolic CHF (EF 41%) due to ischemic cardiomyopathy with a history of MI. AICD, COPD, OSA on CPAP, HTN, and hypothyroidism.   He has a history of multiple MI's. One in 2007 and one in 2014 that resulted in cardiogenic shock and a prolonged ICU hospitalization.   He was admitted to [facility] in June 2018 with hypoglycemia and discharged to Rehab. He was discharged from [place] but quickly readmitted to [facility] with chest pain and SOB. He was told that he had a fluid on his lung and then transferred back to rehab and eventually discharged on 02/07/17. He presented to the ED at [facility] on 02/09/17 with SOB and weakness. Chest X ray with large pleural effusion, he was therefore transferred to [facility] where he underwent a left thoracentesis with transudative fluid.   He underwent repeat right and left heart cath on 02/14/17 that showed severe 3 vessel disease with multiple areas of in-stent restenosis. Right heart with Fick output/index 5.9/3.1. He underwent DES placement x 2 to LCx and mid - RCA. Had a 24-hour Thallium viability study with minimal to no viability in all segments except for the septum. Discharge weight was 158 pounds.   In 7/19 seen in HF Clinic with recurrent PAF and NYHA IIIB  symptoms. Underwent DC-CV on 7/25. Remains in NSR on amio.   Seen by Dr. Prescott Gum 06/14/18 and deemed appropriate candidate for VAD if pt wishes to proceed.He has met a VAD patient and does not want to proceed with VAD implant. He does not like the idea of a big surgery or being attached to something constantly.   Admitted [facility] 12/01/18 with increased shortness of breath. Says he was not taking lasix and had not been weighing at home. Diuresed with IV lasix and transitioned to po lasix 30 mg daily. Digoxin level was 1.0. Discharge weight was 183 pounds.   He presents via Engineer, civil (consulting) for a telehealth visit today.  Overall feeling fine. He has ongoing mild dyspnea with exertion. Denies PND/Orthopnea. No chest pain. Appetite ok. Tries to follow low salt diet.  No fever or chills. No bleeding problems. Weight at home 179 pounds. Taking all medications. Lives alone.    he denies symptoms worrisome for COVID 19.   Past Medical History:  Diagnosis Date  . AICD (automatic cardioverter/defibrillator) present 2014  . CAD (coronary artery disease)    S/p multiple prior MIs/PCIs // amdx in 2014 with acute stent thrombosis c/b CGS // Admx to [facility] in 04/6221 2/2 a/c systolic HF >> [facility] 04/22/97: mLAD 95 ISR, dLAD 30, D2 99; OM2 95 ISR; pRCA 30/50, mRCA 90/80, dRCA 30 EF 15 >> min to no viability on Thallium study (not surgical candidate) - PCI>> 3.5 x 20 mm Promus DES to the LCx 3 x 38  mm Promus DES to the proximal mid RCA  . Chronic systolic heart failure (HCC)    2/2 to ischemic CM - Echo 02/12/17:  Mild LVH, EF 15-20, mod to severe MR, severe LAE, mild RVE, mod reduced RVSF, mild RAE, mod TR, PASP 65  . COPD (chronic obstructive pulmonary disease) ([place])   . History of MI (myocardial infarction)   . History of pneumonia   . HTN (hypertension)   . Hyperlipidemia 03/08/2017  . Hypothyroidism   . OSA (obstructive sleep apnea) 03/08/2017   Past Surgical History:  Procedure Laterality Date  . CARDIAC  DEFIBRILLATOR PLACEMENT  2014   Medtronic Every XT DR ICD implanted at the [facility] for primary prevention  . CARDIOVERSION N/A 03/09/2018   Procedure: CARDIOVERSION;  Surgeon: Jolaine Artist, MD;  Location: [facility] ENDOSCOPY;  Service: Cardiovascular;  Laterality: N/A;  . CORONARY STENT INTERVENTION N/A 02/22/2017   Procedure: Coronary Stent Intervention;  Surgeon: Sherren Mocha, MD;  Location: [place] CV LAB;  Service: Cardiovascular;  Laterality: N/A;  CFX and RCA  . FACIAL RECONSTRUCTION SURGERY Right 1984  . FEMUR FRACTURE SURGERY Right 1984  . RIGHT/LEFT HEART CATH AND CORONARY ANGIOGRAPHY N/A 02/14/2017   Procedure: Right/Left Heart Cath and Coronary Angiography;  Surgeon: Jolaine Artist, MD;  Location: [place] CV LAB;  Service: Cardiovascular;  Laterality: N/A;  . WRIST FRACTURE SURGERY Right 2015     Current Outpatient Medications  Medication Sig Dispense Refill  . ALPRAZolam (XANAX) 0.5 MG tablet Take 0.5-1 mg by mouth at bedtime.     Marland Kitchen amiodarone (PACERONE) 200 MG tablet 1  TABLET  ONCE  DAILY 90 tablet 0  . apixaban (ELIQUIS) 5 MG TABS tablet Take 1 tablet (5 mg total) by mouth 2 (two) times daily. 60 tablet 6  . aspirin EC 81 MG tablet Take 81 mg by mouth daily.    Marland Kitchen atorvastatin (LIPITOR) 20 MG tablet Take 20 mg by mouth at bedtime.    . carvedilol (COREG) 3.125 MG tablet Take 1 tablet (3.125 mg total) by mouth 2 (two) times daily with a meal. 60 tablet 3  . Cholecalciferol (VITAMIN D3) 2000 units TABS Take 2,000 Units by mouth daily.    . digoxin (LANOXIN) 0.125 MG tablet Take 1 tablet (0.125 mg total) by mouth daily. 30 tablet 3  . DULoxetine (CYMBALTA) 30 MG capsule Take 90 mg by mouth at bedtime.     . Ferrous Sulfate (IRON) 325 (65 Fe) MG TABS TAKE 1 TABLET BY MOUTH TWICE DAILY WITH A MEAL 60 tablet 3  . furosemide (LASIX) 20 MG tablet Take 1.5 tablets (30 mg total) by mouth daily. 45 tablet 1  . levothyroxine (SYNTHROID) 200 MCG tablet Take 1 tablet (200 mcg  total) by mouth daily at 6 (six) AM. 30 tablet 1  . Multiple Vitamins-Minerals (MULTIVITAMIN PO) Take 1 tablet by mouth daily.    . pantoprazole (PROTONIX) 40 MG tablet Take 1 tablet (40 mg total) by mouth at bedtime. 30 tablet 6  . potassium chloride (K-DUR) 10 MEQ tablet Take 1 tablet (10 mEq total) by mouth daily. 30 tablet 1  . sacubitril-valsartan (ENTRESTO) 49-51 MG Take 1 tablet by mouth 2 (two) times daily. 60 tablet 6  . spironolactone (ALDACTONE) 25 MG tablet Take 1 tablet (25 mg total) by mouth daily. 30 tablet 3  . vitamin B-12 (CYANOCOBALAMIN) 1000 MCG tablet Take 2,000 mcg by mouth daily.     No current facility-administered medications for this encounter.  Allergies:   Codeine and Mirtazapine   Social History:  The patient  reports that he quit smoking about 24 years ago. His smoking use included cigarettes. He started smoking about 52 years ago. He has a 28.00 pack-year smoking history. He has never used smokeless tobacco. He reports that he does not drink alcohol or use drugs.   Family History:  The patient's family history includes COPD in his sister; Congestive Heart Failure in his sister; Diabetes in his mother, sister, sister, and sister; Heart attack in his father and mother; Heart disease in his father and mother; Hypertension in his brother, sister, sister, and sister; Stroke in his father and mother.   ROS:  Please see the history of present illness.   All other systems are personally reviewed and negative.   Exam:  Tele Health Call; Exam is subjective General:  Speaks in full sentences. No resp difficulty. Lungs: Normal respiratory effort with conversation.  Abdomen: Non-distended per patient report Extremities: Pt denies edema. Neuro: Alert & oriented x 3.   Recent Labs: 12/01/2018: ALT 100; B Natriuretic Peptide >4,500.0; Hemoglobin 13.2; Magnesium 2.2; Platelets 351; TSH 38.051 12/03/2018: BUN 27; Creatinine, Ser 1.25; Potassium 3.8; Sodium 136  Personally  reviewed   Wt Readings from Last 3 Encounters:  12/03/18 83.3 kg (183 lb 10.3 oz)  08/31/18 89.4 kg (197 lb)  07/31/18 89.8 kg (198 lb)      ASSESSMENT AND PLAN:  1.Chronic systolic CHF: ICM, EF 14-78% June 2018 s/p MDT ICD - Echo 6/19 reviewed personally. EF 20%. LV markedly dilated and spherical. RV ok  -NYHA III, chronically.   Volume status sounds stable. Continue lasix 30 mg daily. - Continue Entresto to 49/51.  - Continue spiro (despite mild gynecomastia - he does not think [facility] will approve Inspra)  - Dig level 1.0 on 12/01/18. Cut back digoxin to 0.125 mg every other day.  - Continue Coreg 3.125 mg BID.  - Refuses VAD. He has been evaluated.   2. CAD s/p previous MI - Cath 7/1 with severe 3 v CAD with multiple areas of ISR - s/p 2v PCI 02/22/17 by Dr. Burt Knack.  - Anterior wall is non-viable given previous LAD stent thrombosis. 24-hour Thallium viability study showed minimal or no viability in all segments except for septum -No s/s ischemia.  - Off ASA and Brilinta. Continue Eliqiuis 5 mg BID.  - Continue Crestor.   3. PAF - CHADSVASC = 3 (CHF, HTN, CAD) - s/p  DC-CV 03/09/18.  - Continue Eliquis 5 mg BID and amio 200 mg daily.   4  Chronic hypoxic respiratory failure with large left pleural effusion - S/p thoracentesis in 6/18 - Resolved   5. COPD - Per PCP and pulmonary. No wheeze on exam.  - PFTs 05/04/18 FEV1 3.03 (87%), FVC 77%, TLC 6.14 (87%), DLCO 14.4 (44%) - Ambulated in clinic and O2 sat remained > 96% for the entirety of walking.   6. Hepatitis C - Hepatitis C Antibody positive during work up labs. He states he underwent therapy with Harvoni in 2015 through the [place].   - No further testing warranted at this time with Harvoni. Pt states he can bring his certificate that says he is "cured" if needed.    COVID screen The patient does not have any symptoms that suggest any further testing/ screening at this time.  Social distancing reinforced today.   Patient Risk: After full review of this patients clinical status, I feel that they are at moderate risk  for cardiac decompensation at this time.  Relevant cardiac medications were reviewed at length with the patient today. The patient does not have concerns regarding their medications at this time.   The following changes were made today:  Change digoxin to 0.125 mg every other day.  Recommended follow-up:  Follow up in 2 months   Today, I have spent 15  minutes with the patient with telehealth technology discussing the above issues .    Jeanmarie Hubert, NP  12/05/2018 11:51 AM  [place] [address] [facility] [place] [address] [phone] (office) [phone] (fax)

## 2018-12-05 NOTE — Progress Notes (Signed)
Spoke to pt to review after visit summary instruction. Pt verbalized understanding. No further needs at this time.

## 2018-12-11 ENCOUNTER — Other Ambulatory Visit: Payer: Self-pay

## 2018-12-11 ENCOUNTER — Ambulatory Visit (INDEPENDENT_AMBULATORY_CARE_PROVIDER_SITE_OTHER): Payer: Medicare Other

## 2018-12-11 DIAGNOSIS — I5022 Chronic systolic (congestive) heart failure: Secondary | ICD-10-CM

## 2018-12-11 DIAGNOSIS — Z95810 Presence of automatic (implantable) cardiac defibrillator: Secondary | ICD-10-CM

## 2018-12-12 ENCOUNTER — Telehealth: Payer: Self-pay

## 2018-12-12 MED ORDER — FUROSEMIDE 20 MG PO TABS: 40.0000 mg | ORAL_TABLET | Freq: Every day | ORAL | 2 refills | Status: DC

## 2018-12-12 MED ORDER — POTASSIUM CHLORIDE CRYS ER 10 MEQ PO TBCR: 20.0000 meq | EXTENDED_RELEASE_TABLET | Freq: Every day | ORAL | 2 refills | Status: DC

## 2018-12-12 NOTE — Telephone Encounter (Signed)
Remote ICM transmission received.  Attempted call to patient regarding ICM remote transmission and left detailed message, per DPR, to return call.    

## 2018-12-12 NOTE — Progress Notes (Signed)
Please increase lasix to 40 mg twice a day for 2 days then cut back to 40 mg daily.   He will need to increase potassium to 20 meq daily.   Please call.  Amy Clegg NP-C  2:08 PM

## 2018-12-12 NOTE — Progress Notes (Signed)
Call to patient and advised Darrick Grinder, NP recommended to increase lasix to 40 mg twice a day for 2 days then cut back to 40 mg daily.  Also increase potassium to 20 meq daily.  Patient repeated instructions back correctly.

## 2018-12-12 NOTE — Progress Notes (Signed)
EPIC Encounter for ICM Monitoring  Patient Name: Stephen Cardenas is a 63 y.o. male Date: 12/12/2018 Primary Care Physican: [facility], [facility] Primary Cardiologist:Bensimhon Electrophysiologist:Allred 12/12/2018 Weight:176 lbs (lower than 4/19 discharge weight)  He reports feeling better since he returned home from hospitalization 4/17 - 4/19 for fluid accumulation.  He denies fluid symptoms at this time and stressed the importance of calling the office when symptoms initially start.     OptiVol Thoracic impedance improved since 4/19 hospital discharge and returned close to baseline normal but still suggestive of some fluid accumulation.  Prescribed:Furosemide20 mgTake 1.5 tablet (30 mg total)daily.  Confirmed today he is taking Furosemide 30 mg daily as prescribed.  Labs: 08/31/2018 Creatinine1.34, BUN14, Potassium4.1, Sodium138, GFR56->60  07/02/2018 Creatinine1.16, BUN11, Potassium4.1, Sodium135, GFR>60 05/04/2018 Creatinine1.20, BUN9,Potassium3.3, Sodium137, GFR>60  03/09/2018 Creatinine1.22, BUN10, Potassium4.2, Sodium139, GFR>60  06/19/2019Creatinine 1.14, BUN11, Potassium4.2, Sodium140, GFR>60 11/28/2017 Creatinine 1.20, BUN 15, Potassium 3.9, Sodium 134, GFR >60 A complete set of results can be found in Results Review.  Recommendations: Discussed when to call regarding fluid symptoms, limit salt and fluid intake.   Follow-up plan: ICM clinic phone appointment on 12/18/2018 recheck fluid levels. Office visit with Dr Haroldine Laws 01/02/2019.  Copy of ICM check sent to Dr.Allred, Dr Haroldine Laws and Darrick Grinder, NP since she had virtual visit with pt on 12/05/2018.  3 month ICM trend: 12/11/2018    1 Year ICM trend:       [name], RN 12/12/2018 1:18 PM

## 2018-12-18 ENCOUNTER — Other Ambulatory Visit: Payer: Self-pay

## 2018-12-18 ENCOUNTER — Ambulatory Visit: Payer: Medicare Other

## 2018-12-18 DIAGNOSIS — I5022 Chronic systolic (congestive) heart failure: Secondary | ICD-10-CM

## 2018-12-18 DIAGNOSIS — Z95810 Presence of automatic (implantable) cardiac defibrillator: Secondary | ICD-10-CM

## 2018-12-19 NOTE — Progress Notes (Signed)
EPIC Encounter for ICM Monitoring  Patient Name: Stephen Cardenas is a 63 y.o. male Date: 12/19/2018 Primary Care Physican: [facility], [facility] Primary Cardiologist:Bensimhon Electrophysiologist:Allred 4/28/2020Weight:176 lbs (lower than 4/19 discharge weight) 12/19/2018 Weight:  176 lbs  Clinical Status (11-Dec-2018 to 18-Dec-2018)T  AT/AF  2 episodes  AT/AF  0.3 hr/day (1.4%)  Longest AT/AF      2 hours   Observations (1) (11-Dec-2018 to 18-Dec-2018)  Patient Activity less than 1 hr/day for 1 weeks      Patient reports he has been having some blood in his urine for last week but didn't think it was anything worried about.  He said the amount slightly increased today so he call the [place].  [facility] and [facility].  He does not have any other symptoms such as burning on urination, fever or pain.      OptiVol Thoracic impedance almost back at baseline after temporary Furosemide increase to 40 mg bid on 4/28 & 4/29.  He returned to prescribed dosage on 4/30  Prescribed:Furosemide20 mgTake 2 tablets (40 mg total)daily.   Labs: 08/31/2018 Creatinine1.34, BUN14, Potassium4.1, Sodium138, GFR56->60  07/02/2018 Creatinine1.16, BUN11, Potassium4.1, Sodium135, GFR>60 05/04/2018 Creatinine1.20, BUN9,Potassium3.3, Sodium137, GFR>60  03/09/2018 Creatinine1.22, BUN10, Potassium4.2, Sodium139, GFR>60  06/19/2019Creatinine 1.14, BUN11, Potassium4.2, Sodium140, GFR>60 11/28/2017 Creatinine 1.20, BUN 15, Potassium 3.9, Sodium 134, GFR >60 A complete set of results can be found in Results Review.  Recommendations: No changes but advised to drink 64 oz of fluid daily to stay hydrated.   Follow-up plan: ICM clinic phone appointment on5/18/2020 recheck fluid levels. Office visit with HF clinic 02/07/2019.  Copy of ICM check sent to Dr.Allred and Darrick Grinder, NP to show effectiveness of temporary Furosemide increase.  3  month ICM trend: 12/18/2018    1 Year ICM trend:       [name], RN 12/19/2018 4:32 PM

## 2018-12-20 NOTE — Progress Notes (Signed)
Attempted call to patient to provide Darrick Grinder NP recommendations.  Left message to return call.

## 2018-12-20 NOTE — Progress Notes (Signed)
Please call and increase lasix to 40 mg twice a day and increase potassium to 20 meq twice a day.    Check BMET in 1 week.   Stephen Cardenas 9:39 AM

## 2018-12-22 MED ORDER — FUROSEMIDE 20 MG PO TABS: 40.0000 mg | ORAL_TABLET | Freq: Two times a day (BID) | ORAL | 2 refills | Status: DC

## 2018-12-22 MED ORDER — POTASSIUM CHLORIDE CRYS ER 10 MEQ PO TBCR: 20.0000 meq | EXTENDED_RELEASE_TABLET | Freq: Two times a day (BID) | ORAL | 2 refills | Status: DC

## 2018-12-22 NOTE — Addendum Note (Signed)
Addended by: [name] on: 12/22/2018 11:37 AM   Modules accepted: Orders

## 2018-12-22 NOTE — Progress Notes (Addendum)
Call to patient and advised Stephen Grinder, NP recommended to increase lasix to 40 mg twice a day and increase potassium to 20 meq twice a day.  He has Furosemide 20 mg tablets and advised to take 2 for total of 40 mg twice a day.  His Potassium tabs are 10 mEq and advised he will need to take 2 tablets for total of 20 mEq twice a day. He verbalized understanding of new dosages and has plenty of medication on hand.  Advised to call pharmacy when refill is needed.  Advised will need a BMET in 1 week.  He will have drawn on 5/15 at [facility] in [place] and [place] order faxed to that facility.  Fax number confirmed with facility- [phone].  Received warning when increasing potassium since he is taking Spironolactone.  Stephen Grinder, NP recommended to continue with increase in Potassium while taking spironolactone.

## 2018-12-25 ENCOUNTER — Telehealth (HOSPITAL_COMMUNITY): Payer: Self-pay

## 2018-12-25 ENCOUNTER — Telehealth: Payer: Self-pay

## 2018-12-25 NOTE — Telephone Encounter (Signed)
Call back to patient.  Advised Darrick Grinder, NP wants him to continue current Lasix regimen of Lasix 40 mg twice a day and Potassium 20 mEq twice a day. Advised if weight loss continues, or he has dehydration symptoms of dizziness, lightheadedness, feeling faint, low BP, or decrease in urine output or turns very dark in color to call back.  He verbalized understanding.

## 2018-12-25 NOTE — Telephone Encounter (Signed)
Call to patient and requested a remote transmission be sent for review. He will send it now.   Advised will call him back after Amy reviews the report.

## 2018-12-25 NOTE — Telephone Encounter (Signed)
12/25/2018 Optivol Report sent to [facility] for review.

## 2018-12-25 NOTE — Telephone Encounter (Signed)
Yes please continue.   Can we get another transmission.   Thanks Shriyans Kuenzi NP-C

## 2018-12-25 NOTE — Telephone Encounter (Signed)
   Looking better!  Continue current regimen.   Darrick Grinder NP-C  1:57 PM

## 2018-12-25 NOTE — Telephone Encounter (Signed)
Got a call from the [facility] Mickel [facility] [place]) requesting most recent med list as well as labs from pt was admitted. They are managing the pt's synthroid and had question on what happened to have med change at hospital. Send last office visit, labs and d/c summary via fax to [phone]

## 2018-12-25 NOTE — Telephone Encounter (Signed)
Returned patient call as requested by voice mail message.   He reports losing 9 lbs (was 176 lbs 5/5 to 167 lbs) since starting Lasix 40 mg twice bid and Potassium 20 mEq bid on 5/5.  He feels fine but is concerned he may be losing too much weight too fast.  He wants to know if he should continue to take the evening dose of Lasix and Potassium as ordered on 5/4.  Advised will check with Darrick Grinder, NP HF Clinic for recommendation on Lasix and Potassium dosage.  Routed to Darrick Grinder, NP for recommendation if pt should continue to take increased dosage of Lasix 40 mg bid and Potassium 20 mEq bid that was started on 12/19/2018.

## 2019-01-01 ENCOUNTER — Ambulatory Visit (INDEPENDENT_AMBULATORY_CARE_PROVIDER_SITE_OTHER): Payer: Medicare Other

## 2019-01-01 ENCOUNTER — Other Ambulatory Visit: Payer: Self-pay

## 2019-01-01 DIAGNOSIS — I5022 Chronic systolic (congestive) heart failure: Secondary | ICD-10-CM

## 2019-01-01 DIAGNOSIS — Z95810 Presence of automatic (implantable) cardiac defibrillator: Secondary | ICD-10-CM

## 2019-01-02 ENCOUNTER — Other Ambulatory Visit (HOSPITAL_COMMUNITY): Payer: Medicare Other

## 2019-01-02 ENCOUNTER — Encounter (HOSPITAL_COMMUNITY): Payer: Medicare Other | Admitting: Internal Medicine

## 2019-01-03 ENCOUNTER — Telehealth: Payer: Self-pay

## 2019-01-03 NOTE — Progress Notes (Signed)
EPIC Encounter for ICM Monitoring  Patient Name: Stephen Cardenas is a 63 y.o. male Date: 01/03/2019 Primary Care Physican: [facility], [facility] Primary Cardiologist:Bensimhon Electrophysiologist:Allred 4/28/2020Weight: 176 lbs (lower than 4/19 discharge weight) 12/25/2018 Weight:  167 lbs 01/03/2019 Weight:  163 lbs  Clinical Status (11-Dec-2018 to 18-Dec-2018)T  AT/AF  2 episodes  AT/AF  0.3 hr/day (1.4%)  Longest AT/AF    2 hours   Observations (1) (11-Dec-2018 to 18-Dec-2018)  Patient Activity less than 1 hr/day for 1 weeks                                                        Attempted call to patient and unable to reach.  Left message to return call regarding transmission. Transmission reviewed.   OptiVolThoracic impedancetrending above baseline suggesting dryness.  Prescribed:Furosemide20 mgTake 2tablets (40 mg total)twice a day.Potassium 20 mEq take 1 tablet twice a day.  Labs: 08/31/2018 Creatinine1.34, BUN14, Potassium4.1, Sodium138, GFR56->60  07/02/2018 Creatinine1.16, BUN11, Potassium4.1, Sodium135, GFR>60 05/04/2018 Creatinine1.20, BUN9,Potassium3.3, Sodium137, GFR>60  03/09/2018 Creatinine1.22, BUN10, Potassium4.2, Sodium139, GFR>60  06/19/2019Creatinine 1.14, BUN11, Potassium4.2, Sodium140, GFR>60 11/28/2017 Creatinine 1.20, BUN 15, Potassium 3.9, Sodium 134, GFR >60 A complete set of results can be found in Results Review.  Recommendations: Unable to reach.  Follow-up plan: ICM clinic phone appointment on6/16/2020recheck fluid levels. Office visit with HF clinic 02/07/2019.  Copy of ICM check sent to Dr.Allred, Dr Haroldine Laws, and Darrick Grinder, NP HF clinic.   3 month ICM trend: 01/01/2019    1 Year ICM trend:       [name], RN 01/03/2019 10:20 AM

## 2019-01-03 NOTE — Progress Notes (Signed)
Patient returned call.  He reports feeling fine but he has continued to lose weight.  Weight loss of 13 pounds since 4/28 and the increase of Furosemide to 40 mg bid and Potassium 20 mEq bid.  Impedance is above baseline since 12/27/2018 suggesting dryness.  Advised will call back if any changes or recommendations from HF clinic.

## 2019-01-03 NOTE — Telephone Encounter (Signed)
Remote ICM transmission received.  Attempted call to patient regarding ICM remote transmission and left message, per DPR, to return call.    

## 2019-01-05 NOTE — Progress Notes (Signed)
Call to patient and left message Amy did not recommend any changes to current Furosemide dosage and to call back if has any concerns.  Left message with direct phone number.

## 2019-01-05 NOTE — Progress Notes (Signed)
Received: Yesterday  Message Contents  Conrad [place], NP  Short, Laurie Panda, RN        Thank you!

## 2019-01-09 ENCOUNTER — Telehealth: Payer: Self-pay

## 2019-01-09 NOTE — Telephone Encounter (Signed)
Attempted return call to patient as requested by voice mail message regarding next appt date for HF clinic.  Left message next appointment is 6/24 at 10:30 AM.  Advised to call back with any further questions.

## 2019-01-10 ENCOUNTER — Encounter: Payer: Medicare Other | Admitting: *Deleted

## 2019-01-11 ENCOUNTER — Telehealth: Payer: Self-pay

## 2019-01-11 NOTE — Telephone Encounter (Signed)
Left message for patient to remind of missed remote transmission.  

## 2019-01-15 ENCOUNTER — Telehealth: Payer: Self-pay

## 2019-01-15 NOTE — Telephone Encounter (Signed)
Returned patient call as requested by voice mail message.  He wanted to know when his appointments are scheduled for his Thyroid doctor and HF clinic.  Advised of appointment times and dates.  No further information needed.

## 2019-01-18 ENCOUNTER — Other Ambulatory Visit: Payer: Self-pay

## 2019-01-18 ENCOUNTER — Ambulatory Visit (INDEPENDENT_AMBULATORY_CARE_PROVIDER_SITE_OTHER): Payer: Medicare Other | Admitting: Internal Medicine

## 2019-01-18 ENCOUNTER — Encounter: Payer: Self-pay | Admitting: Internal Medicine

## 2019-01-18 DIAGNOSIS — E039 Hypothyroidism, unspecified: Secondary | ICD-10-CM | POA: Diagnosis not present

## 2019-01-18 NOTE — Progress Notes (Signed)
Patient ID: Stephen Cardenas, male   DOB: 1956/02/23, 63 y.o.   MRN: [id number]   Patient location: Home My location: Office  Referring Provider: [facility], [facility]  I connected with the patient on 01/18/19 at  9:50 EDT by telephone and verified that I am speaking with the correct person.   I discussed the limitations of evaluation and management by telephone and the availability of in person appointments. The patient expressed understanding and agreed to proceed.   Details of the encounter are shown below.  HPI  Stephen Cardenas is a 63 y.o.-year-old male, initially referred by his PCP, Dr. Haroldine Laws, returning for follow-up for hypothyroidism, dx'ed in 2000.  Last visit 6 months ago.  Of note, he has a h/o CAD (s/p 3x AMIs), and also CHF>> will need to have a LVAD.  She was admitted with acute on chronic CHF on 12/01/2018. He was diuresed >> now feeling much better. At that time, a TSH was still high.  He is on amiodarone 200 mg daily.  At last visit, he was on levothyroxine 175 mcg (latest dose change 01/2018 at the [facility]).  However, a TSH returned high and he was referred to endocrinology for further management of his hypothyroidism.  At that time, I noticed that he was taking all his supplements along with levothyroxine.  We moved all of these later in the day and I also advised him to decrease the dose of levothyroxine slightly to 150 mcg daily and come back for labs in 1.5 months.  His TSH increased significantly since then, to 62.5.  We increased the dose at that time to 175 mcg daily.  A subsequent TSH obtained almost 2 months ago was better, but still high.  He is now on 200 mcg levothyroxine daily.  Pt is on levothyroxine 200 mcg daily, taken: - not skipping doses - in am - fasting - at least 2h from b'fast - + Ca, Fe, MVI, PPIs - now moved 2h later - not on Biotin Takes vitamin D3, B12.  I reviewed patient's TFTs: Lab Results  Component Value Date   TSH 38.051 (H)  12/01/2018   TSH 62.51 (H) 09/28/2018   TSH 10.531 (H) 05/04/2018   FREET4 0.89 09/28/2018   T3FREE 2.5 09/28/2018   Pt denies: - feeling nodules in neck - hoarseness - dysphagia - choking - SOB with lying down  She has + FH of thyroid disorders - sister with hypothyroidism. No FH of thyroid cancer. No h/o radiation tx to head or neck.  No herbal supplements. No recent steroids use.   He also has a history of OSA, COPD, HTN, HL.  ROS: Constitutional: no weight gain/no weight loss, + fatigue (improved), no subjective hyperthermia, no subjective hypothermia Eyes: no blurry vision, no xerophthalmia ENT: no sore throat, + see HPI Cardiovascular: no CP/SOB/no palpitations/no leg swelling Respiratory: no cough/SOB/no wheezing Gastrointestinal: no N/no V/no D/no C/no acid reflux Musculoskeletal: + muscle aches/+ joint aches Skin: no rashes, no hair loss Neurological: no tremors/no numbness/no tingling/no dizziness  I reviewed pt's medications, allergies, PMH, social hx, family hx, and changes were documented in the history of present illness. Otherwise, unchanged from my initial visit note.  Psych: + both: anxiety/depression  Past Medical History:  Diagnosis Date  . AICD (automatic cardioverter/defibrillator) present 2014  . CAD (coronary artery disease)    S/p multiple prior MIs/PCIs // amdx in 2014 with acute stent thrombosis c/b CGS // Admx to [facility] in 03/1274 2/2 a/c systolic HF >> [facility] 08/22/98:  mLAD 95 ISR, dLAD 30, D2 99; OM2 95 ISR; pRCA 30/50, mRCA 90/80, dRCA 30 EF 15 >> min to no viability on Thallium study (not surgical candidate) - PCI>> 3.5 x 20 mm Promus DES to the LCx 3 x 38 mm Promus DES to the proximal mid RCA  . Chronic systolic heart failure (HCC)    2/2 to ischemic CM - Echo 02/12/17:  Mild LVH, EF 15-20, mod to severe MR, severe LAE, mild RVE, mod reduced RVSF, mild RAE, mod TR, PASP 65  . COPD (chronic obstructive pulmonary disease) ([place])   . History of MI  (myocardial infarction)   . History of pneumonia   . HTN (hypertension)   . Hyperlipidemia 03/08/2017  . Hypothyroidism   . OSA (obstructive sleep apnea) 03/08/2017   Past Surgical History:  Procedure Laterality Date  . CARDIAC DEFIBRILLATOR PLACEMENT  2014   Medtronic Every XT DR ICD implanted at the [facility] for primary prevention  . CARDIOVERSION N/A 03/09/2018   Procedure: CARDIOVERSION;  Surgeon: Jolaine Artist, MD;  Location: [facility] ENDOSCOPY;  Service: Cardiovascular;  Laterality: N/A;  . CORONARY STENT INTERVENTION N/A 02/22/2017   Procedure: Coronary Stent Intervention;  Surgeon: Sherren Mocha, MD;  Location: [place] CV LAB;  Service: Cardiovascular;  Laterality: N/A;  CFX and RCA  . FACIAL RECONSTRUCTION SURGERY Right 1984  . FEMUR FRACTURE SURGERY Right 1984  . RIGHT/LEFT HEART CATH AND CORONARY ANGIOGRAPHY N/A 02/14/2017   Procedure: Right/Left Heart Cath and Coronary Angiography;  Surgeon: Jolaine Artist, MD;  Location: [place] CV LAB;  Service: Cardiovascular;  Laterality: N/A;  . WRIST FRACTURE SURGERY Right 2015   Social History   Socioeconomic History  . Marital status: Widowed    Spouse name: Not on file  . Number of children: Not on file  . Years of education: Not on file  . Highest education level: Not on file  Occupational History  . Not on file  Social Needs  . Financial resource strain: Not on file  . Food insecurity:    Worry: Not on file    Inability: Not on file  . Transportation needs:    Medical: Not on file    Non-medical: Not on file  Tobacco Use  . Smoking status: Former Smoker    Packs/day: 1.00    Years: 28.00    Pack years: 28.00    Types: Cigarettes    Start date: 1968    Last attempt to quit: 1996    Years since quitting: 24.4  . Smokeless tobacco: Never Used  Substance and Sexual Activity  . Alcohol use: No  . Drug use: No  . Sexual activity: Not on file  Lifestyle  . Physical activity:    Days per week: Not on  file    Minutes per session: Not on file  . Stress: Not on file  Relationships  . Social connections:    Talks on phone: Not on file    Gets together: Not on file    Attends religious service: Not on file    Active member of club or organization: Not on file    Attends meetings of clubs or organizations: Not on file    Relationship status: Not on file  . Intimate partner violence:    Fear of current or ex partner: Not on file    Emotionally abused: Not on file    Physically abused: Not on file    Forced sexual activity: Not on file  Other  Topics Concern  . Not on file  Social History Narrative   Lives in [place]   Attends [employer]   Disabled carpenter   Current Outpatient Medications on File Prior to Visit  Medication Sig Dispense Refill  . ALPRAZolam (XANAX) 0.5 MG tablet Take 0.5-1 mg by mouth at bedtime.     Marland Kitchen amiodarone (PACERONE) 200 MG tablet 1  TABLET  ONCE  DAILY 90 tablet 0  . apixaban (ELIQUIS) 5 MG TABS tablet Take 1 tablet (5 mg total) by mouth 2 (two) times daily. 60 tablet 6  . aspirin EC 81 MG tablet Take 81 mg by mouth daily.    Marland Kitchen atorvastatin (LIPITOR) 20 MG tablet Take 20 mg by mouth at bedtime.    . carvedilol (COREG) 3.125 MG tablet Take 1 tablet (3.125 mg total) by mouth 2 (two) times daily with a meal. 60 tablet 3  . Cholecalciferol (VITAMIN D3) 2000 units TABS Take 2,000 Units by mouth daily.    . digoxin (LANOXIN) 0.125 MG tablet Take 1 tablet (0.125 mg total) by mouth every other day. 45 tablet 3  . DULoxetine (CYMBALTA) 30 MG capsule Take 90 mg by mouth at bedtime.     . Ferrous Sulfate (IRON) 325 (65 Fe) MG TABS TAKE 1 TABLET BY MOUTH TWICE DAILY WITH A MEAL 60 tablet 3  . furosemide (LASIX) 20 MG tablet Take 2 tablets (40 mg total) by mouth 2 (two) times daily. 360 tablet 2  . levothyroxine (SYNTHROID) 200 MCG tablet Take 1 tablet (200 mcg total) by mouth daily at 6 (six) AM. 30 tablet 1  . Multiple Vitamins-Minerals (MULTIVITAMIN PO)  Take 1 tablet by mouth daily.    . pantoprazole (PROTONIX) 40 MG tablet Take 1 tablet (40 mg total) by mouth at bedtime. 30 tablet 6  . potassium chloride (K-DUR) 10 MEQ tablet Take 2 tablets (20 mEq total) by mouth 2 (two) times daily. 360 tablet 2  . sacubitril-valsartan (ENTRESTO) 49-51 MG Take 1 tablet by mouth 2 (two) times daily. 60 tablet 6  . spironolactone (ALDACTONE) 25 MG tablet Take 1 tablet (25 mg total) by mouth daily. 30 tablet 3  . vitamin B-12 (CYANOCOBALAMIN) 1000 MCG tablet Take 2,000 mcg by mouth daily.     No current facility-administered medications on file prior to visit.    Allergies  Allergen Reactions  . Codeine Other (See Comments) and Nausea Only    Increased work of breathing, diaphoretic  . Mirtazapine     Other reaction(s): Other - See Comments   Family History  Problem Relation Age of Onset  . Diabetes Mother   . Heart disease Mother   . Stroke Mother   . Heart attack Mother   . Heart disease Father   . Heart attack Father   . Stroke Father   . COPD Sister   . Congestive Heart Failure Sister   . Hypertension Sister   . Diabetes Sister   . Hypertension Brother   . Diabetes Sister   . Hypertension Sister   . Diabetes Sister   . Hypertension Sister     PE: There were no vitals taken for this visit. Wt Readings from Last 3 Encounters:  12/05/18 179 lb (81.2 kg)  12/03/18 183 lb 10.3 oz (83.3 kg)  08/31/18 197 lb (89.4 kg)   Constitutional:  in NAD  The physical exam was not performed (telephone visit).  ASSESSMENT: 1. Hypothyroidism  PLAN:  1. Patient with longstanding hypothyroidism, on levothyroxine  therapy.  At last visit, he was not taking levothyroxine correctly.  We separated his supplements from levothyroxine and also decrease the dose slightly.  However, the next TSH was extremely high, disproportionate with a decreasing dose.  He cannot explain this.  It is unclear if he missed doses at that time (he is vague about this). We  increased levothyroxine dose afterwards and the TSH decreased but is still very high per last check in 11/2018.  At that time, the LT4 dose was increased further. - he continues on LT4 200 mcg daily - pt feels good on this dose, and especially feels much better after his hospitalization from 11/2018 and subsequent diuresis.  His fatigue has improved and he is not short of breath anymore. - we discussed about taking the thyroid hormone every day, with water, >30 minutes before breakfast, separated by >4 hours from acid reflux medications, calcium, iron, multivitamins. Pt. is still not taking his completely correctly, as he is taking the supplements 2 hours after LT4 >> advised him to move these 4 hours later at least. - will check thyroid tests as soon as possible (now coronavirus pandemic): TSH and fT4 - If labs are abnormal, he will need to return for repeat TFTs in 1.5 months -I would like to see the patient back in 3 months  Orders Placed This Encounter  Procedures  . TSH  . T4, free   - time spent with the patient: 11 minutes, of which >50% was spent in obtaining information about his symptoms, reviewing his previous labs, evaluations, and treatments, counseling him about his condition (please see the discussed topics above), and developing a plan to further investigate and treat it.  Philemon Kingdom, MD PhD [facility] Endocrinology

## 2019-01-18 NOTE — Patient Instructions (Signed)
  Please continue levothyroxine 200 mcg daily.   Take the thyroid hormone every day, with water, at least 30 minutes before breakfast, separated by at least 4 hours from: - acid reflux medications - calcium - iron - multivitamins  Please return in 3 months.

## 2019-01-19 ENCOUNTER — Encounter: Payer: Self-pay | Admitting: Cardiology

## 2019-01-30 ENCOUNTER — Telehealth: Payer: Self-pay

## 2019-01-30 ENCOUNTER — Ambulatory Visit (INDEPENDENT_AMBULATORY_CARE_PROVIDER_SITE_OTHER): Payer: Medicare Other

## 2019-01-30 DIAGNOSIS — Z95810 Presence of automatic (implantable) cardiac defibrillator: Secondary | ICD-10-CM | POA: Diagnosis not present

## 2019-01-30 DIAGNOSIS — I5022 Chronic systolic (congestive) heart failure: Secondary | ICD-10-CM

## 2019-01-30 NOTE — Telephone Encounter (Signed)
Left message for patient to remind of missed remote transmission.  

## 2019-01-31 NOTE — Progress Notes (Signed)
EPIC Encounter for ICM Monitoring  Patient Name: Stephen Cardenas is a 63 y.o. male Date: 01/31/2019 Primary Care Physican: Administration, [facility] Primary Cardiologist:Bensimhon Electrophysiologist:Allred 12/25/2018 Weight: 167 lbs 01/03/2019 Weight:  163 lbs 01/31/2019 Weight: 180 lbs  Clinical Status (01-Jan-2019 to 30-January-2019)  AT/AF 5 episodes  16.2 hr/day (67.4%)    Longest AT/AF 20 days  OBSERVATIONS (3)  AT/AF >= 6 hr for 20 days.  Possible OptiVol fluid accumulation: 20-Jan-2019 -- ongoing.  Patient Activity less than 1 hr/day for 4 weeks.  Spoke with patient. He's gained 13 lbs since 01/03/2019 and has had some dizziness with several falls in the last 2 weeks.  He missed 3 days of Furosemide x 3 days and taking wrong dosage.  He has been taking 30 mg of Furosemide daily instead of 40 mg twice a day since 01/01/2019.  Also taking wrong Potassium dosage since 5/18.  Furosemide and Potassium dosages were increased 5/8 and patient reported on 5/22 that he was taking the way it was prescribed.  He said he does not remember those dosages were changed.  He denied missing other prescribed medications.    OptiVolThoracic impedancesuggesting possible fluid accumulation since 5/24 and index > threshold since 6/6.    AT/AF increased from 1.4% on 11-Dec-2018 to 04-May report to 67.4% on today's report.  Prescribed:Furosemide20 mgtake2tablets(40 mg total)twice a day.Potassium 20 mEq take 2 tablets twice a day.  Labs: 08/31/2018 Creatinine1.34, BUN14, Potassium4.1, [id number], GFR56->60  07/02/2018 Creatinine1.16, BUN11, Potassium4.1, Sodium135, GFR>60 05/04/2018 Creatinine1.20, BUN9,Potassium3.3, Sodium137, GFR>60  A complete set of results can be found in Results Review.  Recommendations:Advised the importance of calling myself or Dr Bensimhon's office if he gains 3 lbs overnight or  5 pounds in a week.  Also reviewed multiple times the correct Furosemide and Potassium dosages he should be taking and to start the correct dosages today.  Will call back if any other recommendations from Dr Bensimhon's office.   Follow-up plan: ICM clinic phone appointment on6/23/2020recheck fluid levels. Office visit with HF clinic 02/07/2019.  Copy of ICM check sent to Dr.Allred and Dr Haroldine Laws for review and recommendations.   AT/AF   3 month ICM trend: 01/30/2019     1 Year ICM trend:       [name], RN 01/31/2019 9:44 AM

## 2019-02-02 ENCOUNTER — Telehealth: Payer: Self-pay

## 2019-02-02 NOTE — Progress Notes (Signed)
Patient call today and reports weight has decreased do 175 lbs since taking prescribed Furosemide correctly which is a loss of 5 lbs.  Next remote transmission 02/06/2019.

## 2019-02-02 NOTE — Telephone Encounter (Signed)
Patient called to ask when his next appointment is with HF clinic.  Advised 02/07/2019 at 10:30 and he repeated back the information.  He's weight has dropped to 175 lbs after starting to take Furosemide prescription correctly.

## 2019-02-06 ENCOUNTER — Ambulatory Visit (INDEPENDENT_AMBULATORY_CARE_PROVIDER_SITE_OTHER): Payer: Medicare Other

## 2019-02-06 DIAGNOSIS — Z95810 Presence of automatic (implantable) cardiac defibrillator: Secondary | ICD-10-CM

## 2019-02-06 DIAGNOSIS — I5022 Chronic systolic (congestive) heart failure: Secondary | ICD-10-CM

## 2019-02-06 NOTE — Progress Notes (Signed)
EPIC Encounter for ICM Monitoring  Patient Name: Stephen Cardenas is a 63 y.o. male Date: 02/06/2019 Primary Care Physican: [facility], [facility] Primary Cardiologist:Bensimhon Electrophysiologist:Allred 12/25/2018 Weight:167lbs 01/03/2019 Weight: 163 lbs 01/31/2019 Weight: 180 lbs 02/06/2019 Weight: 175 lbs  12/18/2018   report shows 1.4% time in AT/AF 02/06/2019 report shows 100% time in AT/AF   Clinical Status (30-Jan-2019 to 06-Feb-2019)  AT/AF                   1 episode  Time in AT/AF     24.0 hr/day (100.0%)  Longest AT/AF     26 days Observations (2) (30-Jan-2019 to 06-Feb-2019)  AT/AF >= 6 hr for 7 days.  Possible OptiVol fluid accumulation: 20-Jan-2019 -- ongoing.  Spoke with patient.   He decreased Furosemide dosage to 20 mg daily due to feeling dizziness and extreme leg weakness.  He c/o dry hacking cough.  Weight has decreased but still not as low as it was in May.  He is no longer sure what is his baseline weight.  He said he feels really strange and not feeling well but can not explain it.       OptiVolThoracic impedanceremains abnormal suggesting possible fluid accumulation since 5/24 and index > threshold since 6/6.    Prescribed:Furosemide20 mgtake2tablets(40 mg total)twice a day.Potassium 20 mEq take 2 tablets twice a day.  Labs: 08/31/2018 Creatinine1.34, BUN14, Potassium4.1, [id number], GFR56->60  07/02/2018 Creatinine1.16, BUN11, Potassium4.1, Sodium135, GFR>60 05/04/2018 Creatinine1.20, BUN9,Potassium3.3, Sodium137, GFR>60  A complete set of results can be found in Results Review.  Recommendations:Advised to call 911 or go to ER if he is not feeling well and thinks he needs to be evaluated.  He has a HF clinic appt tomorrow and hopes he will not need to go to ER.   Follow-up plan: ICM clinic phone appointment on6/29/2020recheck fluid levels. Office visit with HF  clinic 02/07/2019.  Copy of ICM check sent to Dr.Allred, Dr Haroldine Laws and Darrick Grinder, NP for HF clinic.  Message also sent to Dr Rayann Heman regarding percentage increase in AT/AF.   AT/AF     3 month ICM trend: 02/06/2019    1 Year ICM trend:       [name], RN 02/06/2019 1:15 PM

## 2019-02-07 ENCOUNTER — Other Ambulatory Visit: Payer: Self-pay

## 2019-02-07 ENCOUNTER — Encounter (HOSPITAL_COMMUNITY): Payer: Self-pay | Admitting: Emergency Medicine

## 2019-02-07 ENCOUNTER — Ambulatory Visit (HOSPITAL_BASED_OUTPATIENT_CLINIC_OR_DEPARTMENT_OTHER)
Admission: RE | Admit: 2019-02-07 | Discharge: 2019-02-07 | Disposition: A | Discharge status: Home / Self Care | Payer: Medicare Other | Source: Ambulatory Visit | Attending: Internal Medicine | Admitting: Internal Medicine

## 2019-02-07 ENCOUNTER — Emergency Department (HOSPITAL_COMMUNITY)
Admission: EM | Admit: 2019-02-07 | Discharge: 2019-02-07 | Disposition: A | Discharge status: Home / Self Care | Payer: Medicare Other | Attending: Emergency Medicine | Admitting: Emergency Medicine

## 2019-02-07 ENCOUNTER — Encounter (HOSPITAL_COMMUNITY): Payer: Self-pay

## 2019-02-07 VITALS — BP 115/70 | HR 78 | Wt 178.2 lb

## 2019-02-07 DIAGNOSIS — Z79899 Other long term (current) drug therapy: Secondary | ICD-10-CM | POA: Insufficient documentation

## 2019-02-07 DIAGNOSIS — Z7901 Long term (current) use of anticoagulants: Secondary | ICD-10-CM | POA: Diagnosis not present

## 2019-02-07 DIAGNOSIS — N183 Chronic kidney disease, stage 3 (moderate): Secondary | ICD-10-CM | POA: Insufficient documentation

## 2019-02-07 DIAGNOSIS — Z87891 Personal history of nicotine dependence: Secondary | ICD-10-CM | POA: Insufficient documentation

## 2019-02-07 DIAGNOSIS — I4891 Unspecified atrial fibrillation: Secondary | ICD-10-CM

## 2019-02-07 DIAGNOSIS — Z7982 Long term (current) use of aspirin: Secondary | ICD-10-CM | POA: Diagnosis not present

## 2019-02-07 DIAGNOSIS — I480 Paroxysmal atrial fibrillation: Secondary | ICD-10-CM | POA: Insufficient documentation

## 2019-02-07 DIAGNOSIS — I2510 Atherosclerotic heart disease of native coronary artery without angina pectoris: Secondary | ICD-10-CM | POA: Diagnosis not present

## 2019-02-07 DIAGNOSIS — R0602 Shortness of breath: Secondary | ICD-10-CM | POA: Diagnosis not present

## 2019-02-07 DIAGNOSIS — J449 Chronic obstructive pulmonary disease, unspecified: Secondary | ICD-10-CM | POA: Insufficient documentation

## 2019-02-07 DIAGNOSIS — I5022 Chronic systolic (congestive) heart failure: Secondary | ICD-10-CM | POA: Insufficient documentation

## 2019-02-07 DIAGNOSIS — R531 Weakness: Secondary | ICD-10-CM | POA: Diagnosis not present

## 2019-02-07 DIAGNOSIS — E039 Hypothyroidism, unspecified: Secondary | ICD-10-CM | POA: Diagnosis not present

## 2019-02-07 DIAGNOSIS — R002 Palpitations: Secondary | ICD-10-CM | POA: Diagnosis not present

## 2019-02-07 DIAGNOSIS — I130 Hypertensive heart and chronic kidney disease with heart failure and stage 1 through stage 4 chronic kidney disease, or unspecified chronic kidney disease: Secondary | ICD-10-CM | POA: Insufficient documentation

## 2019-02-07 LAB — BASIC METABOLIC PANEL
Anion gap: 11 (ref 5–15)
BUN: 31 mg/dL — ABNORMAL HIGH (ref 8–23)
CO2: 24 mmol/L (ref 22–32)
Calcium: 9.2 mg/dL (ref 8.9–10.3)
Chloride: 106 mmol/L (ref 98–111)
Creatinine, Ser: 1.52 mg/dL — ABNORMAL HIGH (ref 0.61–1.24)
GFR calc Af Amer: 55 mL/min — ABNORMAL LOW (ref >60)
GFR calc non Af Amer: 48 mL/min — ABNORMAL LOW (ref >60)
Glucose, Bld: 116 mg/dL — ABNORMAL HIGH (ref 70–99)
Potassium: 4.2 mmol/L (ref 3.5–5.1)
Sodium: 141 mmol/L (ref 135–145)

## 2019-02-07 LAB — CBC
HCT: 36.2 % — ABNORMAL LOW (ref 39.0–52.0)
Hemoglobin: 12.1 g/dL — ABNORMAL LOW (ref 13.0–17.0)
MCH: 32.8 pg (ref 26.0–34.0)
MCHC: 33.4 g/dL (ref 30.0–36.0)
MCV: 98.1 fL (ref 80.0–100.0)
Platelets: 276 10*3/uL (ref 150–400)
RBC: 3.69 MIL/uL — ABNORMAL LOW (ref 4.22–5.81)
RDW: 15.3 % (ref 11.5–15.5)
WBC: 9.4 10*3/uL (ref 4.0–10.5)
nRBC: 0 % (ref 0.0–0.2)

## 2019-02-07 MED ORDER — AMIODARONE HCL 200 MG PO TABS: ORAL_TABLET | ORAL | 0 refills | Status: DC

## 2019-02-07 MED ORDER — PROPOFOL 10 MG/ML IV BOLUS
0.5000 mg/kg | Freq: Once | INTRAVENOUS | Status: DC
  Filled 2019-02-07: qty 20

## 2019-02-07 MED ORDER — PROPOFOL 10 MG/ML IV BOLUS
INTRAVENOUS | Status: AC | PRN
  Administered 2019-02-07: 70 mg via INTRAVENOUS

## 2019-02-07 NOTE — ED Provider Notes (Signed)
[facility] EMERGENCY DEPARTMENT Provider Note   CSN: [id number] Arrival date & time: 02/07/19  1125    History   Chief Complaint Chief Complaint  Patient presents with   Atrial Fibrillation    HPI Stephen Cardenas is a 63 y.o. male.     46 yo M with a chief complaints of fatigue.  Going on for a few days.  Seen in heart failure clinic today and found to be in atrial fibrillation.  Thought to be very symptomatic A. fib sent down from clinic for electrical cardioversion.  The history is provided by the patient.  Atrial Fibrillation This is a recurrent problem. The current episode started 2 days ago. The problem occurs constantly. The problem has been gradually worsening. Associated symptoms include shortness of breath (occasionally with palpitations). Pertinent negatives include no chest pain, no abdominal pain and no headaches. Nothing aggravates the symptoms. Nothing relieves the symptoms. He has tried nothing for the symptoms. The treatment provided no relief.    Past Medical History:  Diagnosis Date   AICD (automatic cardioverter/defibrillator) present 2014   CAD (coronary artery disease)    S/p multiple prior MIs/PCIs // amdx in 2014 with acute stent thrombosis c/b CGS // Admx to [facility] in 0/9407 2/2 a/c systolic HF >> [facility] 01/22/07: mLAD 95 ISR, dLAD 30, D2 99; OM2 95 ISR; pRCA 30/50, mRCA 90/80, dRCA 30 EF 15 >> min to no viability on Thallium study (not surgical candidate) - PCI>> 3.5 x 20 mm Promus DES to the LCx 3 x 38 mm Promus DES to the proximal mid RCA   Chronic systolic heart failure (HCC)    2/2 to ischemic CM - Echo 02/12/17:  Mild LVH, EF 15-20, mod to severe MR, severe LAE, mild RVE, mod reduced RVSF, mild RAE, mod TR, PASP 65   COPD (chronic obstructive pulmonary disease) (HCC)    History of MI (myocardial infarction)    History of pneumonia    HTN (hypertension)    Hyperlipidemia 03/08/2017   Hypothyroidism    OSA (obstructive sleep  apnea) 03/08/2017    Patient Active Problem List   Diagnosis Date Noted   Acute on chronic congestive heart failure (HCC)    Chronic renal disease, stage III (HCC)    Anxiety and depression    Acute on chronic systolic HF (heart failure) ([place]) 12/01/2018   PAF (paroxysmal atrial fibrillation) ([place]) 10/10/2017   Pulmonary nodule, left 05/23/2017   COPD (chronic obstructive pulmonary disease) ([place]) 05/23/2017   ICD (implantable cardioverter-defibrillator) in place 03/08/2017   Essential hypertension 03/08/2017   Hyperlipidemia 03/08/2017   OSA (obstructive sleep apnea) 03/08/2017   CAD (coronary artery disease)    Pleural effusion    Chronic systolic heart failure ([place])    Acute respiratory failure with hypoxia ([place]) 02/09/2017   Ventilator dependent ([place])    Shortness of breath    SOB (shortness of breath)     Past Surgical History:  Procedure Laterality Date   CARDIAC DEFIBRILLATOR PLACEMENT  2014   Medtronic Every XT DR ICD implanted at the [facility] for primary prevention   CARDIOVERSION N/A 03/09/2018   Procedure: CARDIOVERSION;  Surgeon: Jolaine Artist, MD;  Location[facility] ENDOSCOPY;  Service: Cardiovascular;  Laterality: N/A;   CORONARY STENT INTERVENTION N/A 02/22/2017   Procedure: Coronary Stent Intervention;  Surgeon: Sherren Mocha, MD;  Location: [place] CV LAB;  Service: Cardiovascular;  Laterality: N/A;  CFX and RCA   FACIAL RECONSTRUCTION SURGERY Right 1984  FEMUR FRACTURE SURGERY Right 1984   RIGHT/LEFT HEART CATH AND CORONARY ANGIOGRAPHY N/A 02/14/2017   Procedure: Right/Left Heart Cath and Coronary Angiography;  Surgeon: Jolaine Artist, MD;  Location: [place] CV LAB;  Service: Cardiovascular;  Laterality: N/A;   WRIST FRACTURE SURGERY Right 2015        Home Medications    Prior to Admission medications   Medication Sig Start Date End Date Taking? Authorizing Provider  ALPRAZolam Duanne Moron) 0.5 MG tablet Take 0.5-1 mg by  mouth at bedtime.     [provider]  amiodarone (PACERONE) 200 MG tablet 1  TABLET  ONCE  DAILY 11/08/18   Bensimhon, Shaune Pascal, MD  apixaban (ELIQUIS) 5 MG TABS tablet Take 1 tablet (5 mg total) by mouth 2 (two) times daily. 02/13/18   Bensimhon, Shaune Pascal, MD  ascorbic acid (VITAMIN C) 500 MG tablet Take 500 mg by mouth daily.    [provider]  aspirin EC 81 MG tablet Take 81 mg by mouth daily.    [provider]  atorvastatin (LIPITOR) 20 MG tablet Take 20 mg by mouth at bedtime.    [provider]  carvedilol (COREG) 3.125 MG tablet Take 1 tablet (3.125 mg total) by mouth 2 (two) times daily with a meal. 03/17/17   Arbutus Leas, NP  Cholecalciferol (VITAMIN D3) 2000 units TABS Take 2,000 Units by mouth daily.    [provider]  digoxin (LANOXIN) 0.125 MG tablet Take 1 tablet (0.125 mg total) by mouth every other day. 12/05/18   Clegg, Amy D, NP  DULoxetine (CYMBALTA) 30 MG capsule Take 90 mg by mouth at bedtime.     [provider]  Ferrous Sulfate (IRON) 325 (65 Fe) MG TABS TAKE 1 TABLET BY MOUTH TWICE DAILY WITH A MEAL 07/25/18   Bensimhon, Shaune Pascal, MD  furosemide (LASIX) 20 MG tablet Take 2 tablets (40 mg total) by mouth 2 (two) times daily. 12/22/18   Clegg, Amy D, NP  levothyroxine (SYNTHROID) 200 MCG tablet Take 1 tablet (200 mcg total) by mouth daily at 6 (six) AM. 12/04/18   Barton Dubois, MD  Multiple Vitamins-Minerals (MULTIVITAMIN PO) Take 1 tablet by mouth daily.    [provider]  pantoprazole (PROTONIX) 40 MG tablet Take 1 tablet (40 mg total) by mouth at bedtime. 04/11/18   Bensimhon, Shaune Pascal, MD  potassium chloride (K-DUR) 10 MEQ tablet Take 2 tablets (20 mEq total) by mouth 2 (two) times daily. 12/22/18   Clegg, Amy D, NP  rosuvastatin (CRESTOR) 40 MG tablet Take 40 mg by mouth daily.    [provider]  sacubitril-valsartan (ENTRESTO) 49-51 MG Take 1 tablet by mouth 2 (two) times daily. 08/31/18   Bensimhon,  Shaune Pascal, MD  spironolactone (ALDACTONE) 25 MG tablet Take 1 tablet (25 mg total) by mouth daily. 03/17/17   Arbutus Leas, NP  vitamin B-12 (CYANOCOBALAMIN) 1000 MCG tablet Take 2,000 mcg by mouth daily.    [provider]    Family History Family History  Problem Relation Age of Onset   Diabetes Mother    Heart disease Mother    Stroke Mother    Heart attack Mother    Heart disease Father    Heart attack Father    Stroke Father    COPD Sister    Congestive Heart Failure Sister    Hypertension Sister    Diabetes Sister    Hypertension Brother    Diabetes Sister  Hypertension Sister    Diabetes Sister    Hypertension Sister     Social History Social History   Tobacco Use   Smoking status: Former Smoker    Packs/day: 1.00    Years: 28.00    Pack years: 28.00    Types: Cigarettes    Start date: 1968    Quit date: 1996    Years since quitting: 24.4   Smokeless tobacco: Never Used  Substance Use Topics   Alcohol use: No   Drug use: No     Allergies   Codeine, Mirtazapine, and Prednisone   Review of Systems Review of Systems  Constitutional: Negative for chills and fever.  HENT: Negative for congestion and facial swelling.   Eyes: Negative for discharge and visual disturbance.  Respiratory: Positive for shortness of breath (occasionally with palpitations).   Cardiovascular: Positive for palpitations. Negative for chest pain.  Gastrointestinal: Negative for abdominal pain, diarrhea and vomiting.  Musculoskeletal: Negative for arthralgias and myalgias.  Skin: Negative for color change and rash.  Neurological: Positive for weakness. Negative for tremors, syncope and headaches.  Psychiatric/Behavioral: Negative for confusion and dysphoric mood.     Physical Exam Updated Vital Signs BP 121/81    Pulse 81    Temp 98.7 F (37.1 C) (Oral)    Resp (!) 33    Ht 5\' 8"  (1.727 m)    Wt 80 kg    SpO2 98%    BMI 26.82 kg/m   Physical  Exam Vitals signs and nursing note reviewed.  Constitutional:      Appearance: He is well-developed.     Comments: Chronically ill-appearing.  HENT:     Head: Normocephalic and atraumatic.  Eyes:     Pupils: Pupils are equal, round, and reactive to light.  Neck:     Musculoskeletal: Normal range of motion and neck supple.     Vascular: No JVD.  Cardiovascular:     Rate and Rhythm: Normal rate and regular rhythm.     Heart sounds: No murmur. No friction rub. No gallop.   Pulmonary:     Effort: No respiratory distress.     Breath sounds: No wheezing.  Abdominal:     General: There is no distension.     Tenderness: There is no guarding or rebound.  Musculoskeletal: Normal range of motion.  Skin:    Coloration: Skin is not pale.     Findings: No rash.  Neurological:     Mental Status: He is alert and oriented to person, place, and time.  Psychiatric:        Behavior: Behavior normal.      ED Treatments / Results  Labs (all labs ordered are listed, but only abnormal results are displayed) Labs Reviewed - No data to display  EKG None  Radiology No results found.  Procedures .Sedation  Date/Time: 02/07/2019 2:00 PM Performed by: Deno Etienne, DO Authorized by: Deno Etienne, DO   Consent:    Consent obtained:  Verbal and written   Consent given by:  Patient   Risks discussed:  Allergic reaction, prolonged hypoxia resulting in organ damage, respiratory compromise necessitating ventilatory assistance and intubation, nausea, vomiting and inadequate sedation   Alternatives discussed:  Analgesia without sedation and anxiolysis Universal protocol:    Immediately prior to procedure a time out was called: yes     Patient identity confirmation method:  Verbally with patient Indications:    Procedure performed:  Cardioversion   Procedure necessitating sedation performed by:  Different physician Pre-sedation assessment:    Time since last food or drink:  Last night   ASA  classification: class 3 - patient with severe systemic disease     Neck mobility: normal     Mouth opening:  2 finger widths   Thyromental distance:  4 finger widths   Mallampati score:  II - soft palate, uvula, fauces visible   Pre-sedation assessments completed and reviewed: airway patency, cardiovascular function, hydration status, mental status, nausea/vomiting, pain level, respiratory function and temperature   Immediate pre-procedure details:    Reviewed: vital signs, relevant labs/tests and NPO status     Verified: bag valve mask available, emergency equipment available, intubation equipment available, IV patency confirmed, oxygen available, reversal medications available and suction available   Procedure details (see MAR for exact dosages):    Preoxygenation:  Nasal cannula   Sedation:  Propofol   Analgesia:  None   Intra-procedure monitoring:  Blood pressure monitoring, cardiac monitor, continuous pulse oximetry, frequent LOC assessments and frequent vital sign checks   Intra-procedure events: hypoxia     Intra-procedure management:  Airway repositioning and supplemental oxygen   Total Provider sedation time (minutes):  30 Post-procedure details:    Attendance: Constant attendance by certified staff until patient recovered     Recovery: Patient returned to pre-procedure baseline     Post-sedation assessments completed and reviewed: airway patency, cardiovascular function, hydration status, mental status, nausea/vomiting, pain level, respiratory function and temperature     Patient is stable for discharge or admission: yes     Patient tolerance:  Tolerated well, no immediate complications Comments:     70mg  propofol with adequate sedation.  Mild hypoxia into the upper 80s improved with supplemental O2.    (including critical care time)  Medications Ordered in ED Medications  propofol (DIPRIVAN) 10 mg/mL bolus/IV push 40 mg (has no administration in time range)  propofol  (DIPRIVAN) 10 mg/mL bolus/IV push (70 mg Intravenous Given 02/07/19 1343)     Initial Impression / Assessment and Plan / ED Course  I have reviewed the triage vital signs and the nursing notes.  Pertinent labs & imaging results that were available during my care of the patient were reviewed by me and considered in my medical decision making (see chart for details).        63 yo M with a chief complaint of fatigue.  Was seen today in the heart failure clinic and was sent here for cardioversion for symptomatic A. Fib.  Patient was sedated with 70 mg of propofol as per the last time he had cardioversion in the anesthesia suite.  Patient was cardioverted with 1 dose of 200 J biphasic.  Converted to normal sinus rhythm.  Discharge home.  2:03 PM:  I have discussed the diagnosis/risks/treatment options with the patient and believe the pt to be eligible for discharge home to follow-up with Cards. We also discussed returning to the ED immediately if new or worsening sx occur. We discussed the sx which are most concerning (e.g., sudden worsening pain, fever, inability to tolerate by mouth) that necessitate immediate return. Medications administered to the patient during their visit and any new prescriptions provided to the patient are listed below.  Medications given during this visit Medications  propofol (DIPRIVAN) 10 mg/mL bolus/IV push 40 mg (has no administration in time range)  propofol (DIPRIVAN) 10 mg/mL bolus/IV push (70 mg Intravenous Given 02/07/19 1343)     The patient appears reasonably screen and/or stabilized for discharge and  I doubt any other medical condition or other [facility] requiring further screening, evaluation, or treatment in the ED at this time prior to discharge.    Final Clinical Impressions(s) / ED Diagnoses   Final diagnoses:  Paroxysmal atrial fibrillation [facility])    ED Discharge Orders    None       Deno Etienne, DO 02/07/19 1403

## 2019-02-07 NOTE — Sedation Documentation (Signed)
Patient shocked at 200 J

## 2019-02-07 NOTE — Addendum Note (Signed)
Encounter addended by: Jolaine Artist, MD on: 02/07/2019 4:50 PM  Actions taken: Clinical Note Signed

## 2019-02-07 NOTE — Addendum Note (Signed)
Encounter addended by: Jovita Kussmaul, RN on: 02/07/2019 11:13 AM  Actions taken: LDA properties accepted

## 2019-02-07 NOTE — CV Procedure (Signed)
    DIRECT CURRENT CARDIOVERSION  NAME:  Stephen Cardenas   MRN: [id number] DOB:  1956/01/10   ADMIT DATE: 02/07/2019   INDICATIONS: Atrial fibrillation    PROCEDURE:   Informed consent was obtained prior to the procedure. The risks, benefits and alternatives for the procedure were discussed and the patient comprehended these risks. Once an appropriate time out was taken, the patient had the defibrillator pads placed in the anterior and posterior position. The patient then underwent sedation by Dr. Tyrone Nine in the ER. Once an appropriate level of sedation was achieved, the patient received a single biphasic, synchronized 200J shock with prompt conversion to sinus rhythm. No apparent complications.  Glori Bickers, MD  1:58 PM

## 2019-02-07 NOTE — Progress Notes (Addendum)
Advanced Heart Failure Clinic Note   Referring Physician: Dr. Orpah Greek, [facility] Primary Care: Dr. Clementeen Graham Primary Cardiologist: New to Dr. Haroldine Laws.   HPI: Stephen Cardenas is a 63 y.o. male with a past medical history of chronic systolic CHF (EF 96%) due to ischemic cardiomyopathy with a history of MI. AICD, COPD, OSA on CPAP, HTN, PAF, and hypothyroidism.   He has a history of multiple MI's. One in 2007 and one in 2014 that resulted in cardiogenic shock and a prolonged ICU hospitalization.   He was admitted to [facility] in June 2018 with hypoglycemia and discharged to Rehab. He was discharged from [facility] but quickly readmitted to [facility] with chest pain and SOB. He was told that he had a fluid on his lung and then transferred back to rehab and eventually discharged on 02/07/17. He presented to the ED at [facility] on 02/09/17 with SOB and weakness. Chest X ray with large pleural effusion, he was therefore transferred to [facility] where he underwent a left thoracentesis with transudative fluid.   He underwent repeat right and left heart cath on 02/14/17 that showed severe 3 vessel disease with multiple areas of in-stent restenosis. Right heart with Fick output/index 5.9/3.1. He underwent DES placement x 2 to LCx and mid - RCA. Had a 24-hour Thallium viability study with minimal to no viability in all segments except for the septum. Discharge weight was 158 pounds.   In 7/19 seen in HF Clinic with recurrent AF and NYHA IIIB symptoms. Underwent DC-CV on 7/25.   Seen by Dr. Prescott Gum 06/14/18 and deemed appropriate candidate for VAD if pt wishes to proceed.He has met a VAD patient and does not want to proceed with VAD implant. He does not like the idea of a big surgery or being attached to something constantly.   Today he returns for HF follow up. Overall feeling terrible. SOB with exertion over the last 3-4 days. Dizzy today.  Denies PND/Orthopnea. No chest pain. Appetite ok. No  fever or chills. Weight at home 175  pounds. Taking all medications. He has not missed any doses of eliquis.   CPX (04/06/18)  FVC 3.58 (79%)    FEV1 2.91 (84%)     FEV1/FVC 81 (105%)    MVV 128 (94%)  Resting HR: 67 Peak HR: 118  (76% age predicted max HR) BP rest: 94/58 BP peak: 102/60 Peak VO2: 14.8 (50% predicted peak VO2) VE/VCO2 slope: 36  Echo 6/19 EF 20% RV ok Personally reviewed  Review of systems complete and found to be negative unless listed in HPI.    Past Medical History:  Diagnosis Date   AICD (automatic cardioverter/defibrillator) present 2014   CAD (coronary artery disease)    S/p multiple prior MIs/PCIs // amdx in 2014 with acute stent thrombosis c/b CGS // Admx to [facility] in 02/8937 2/2 a/c systolic HF >> [facility] 1/0/17: mLAD 95 ISR, dLAD 30, D2 99; OM2 95 ISR; pRCA 30/50, mRCA 90/80, dRCA 30 EF 15 >> min to no viability on Thallium study (not surgical candidate) - PCI>> 3.5 x 20 mm Promus DES to the LCx 3 x 38 mm Promus DES to the proximal mid RCA   Chronic systolic heart failure (HCC)    2/2 to ischemic CM - Echo 02/12/17:  Mild LVH, EF 15-20, mod to severe MR, severe LAE, mild RVE, mod reduced RVSF, mild RAE, mod TR, PASP 65   COPD (chronic obstructive pulmonary disease) (HCC)    History of MI (  myocardial infarction)    History of pneumonia    HTN (hypertension)    Hyperlipidemia 03/08/2017   Hypothyroidism    OSA (obstructive sleep apnea) 03/08/2017    Current Outpatient Medications  Medication Sig Dispense Refill   ALPRAZolam (XANAX) 0.5 MG tablet Take 0.5-1 mg by mouth at bedtime.      amiodarone (PACERONE) 200 MG tablet 1  TABLET  ONCE  DAILY 90 tablet 0   apixaban (ELIQUIS) 5 MG TABS tablet Take 1 tablet (5 mg total) by mouth 2 (two) times daily. 60 tablet 6   ascorbic acid (VITAMIN C) 500 MG tablet Take 500 mg by mouth daily.     aspirin EC 81 MG tablet Take 81 mg by mouth daily.     atorvastatin (LIPITOR) 20 MG tablet Take 20 mg by  mouth at bedtime.     carvedilol (COREG) 3.125 MG tablet Take 1 tablet (3.125 mg total) by mouth 2 (two) times daily with a meal. 60 tablet 3   Cholecalciferol (VITAMIN D3) 2000 units TABS Take 2,000 Units by mouth daily.     digoxin (LANOXIN) 0.125 MG tablet Take 1 tablet (0.125 mg total) by mouth every other day. 45 tablet 3   DULoxetine (CYMBALTA) 30 MG capsule Take 90 mg by mouth at bedtime.      Ferrous Sulfate (IRON) 325 (65 Fe) MG TABS TAKE 1 TABLET BY MOUTH TWICE DAILY WITH A MEAL 60 tablet 3   furosemide (LASIX) 20 MG tablet Take 2 tablets (40 mg total) by mouth 2 (two) times daily. 360 tablet 2   levothyroxine (SYNTHROID) 200 MCG tablet Take 1 tablet (200 mcg total) by mouth daily at 6 (six) AM. 30 tablet 1   Multiple Vitamins-Minerals (MULTIVITAMIN PO) Take 1 tablet by mouth daily.     pantoprazole (PROTONIX) 40 MG tablet Take 1 tablet (40 mg total) by mouth at bedtime. 30 tablet 6   potassium chloride (K-DUR) 10 MEQ tablet Take 2 tablets (20 mEq total) by mouth 2 (two) times daily. 360 tablet 2   sacubitril-valsartan (ENTRESTO) 49-51 MG Take 1 tablet by mouth 2 (two) times daily. 60 tablet 6   spironolactone (ALDACTONE) 25 MG tablet Take 1 tablet (25 mg total) by mouth daily. 30 tablet 3   vitamin B-12 (CYANOCOBALAMIN) 1000 MCG tablet Take 2,000 mcg by mouth daily.     rosuvastatin (CRESTOR) 40 MG tablet Take 40 mg by mouth daily.     No current facility-administered medications for this encounter.     Allergies  Allergen Reactions   Codeine Other (See Comments) and Nausea Only    Increased work of breathing, diaphoretic   Mirtazapine     Other reaction(s): Other - See Comments   Prednisone     Anxiety and paranoia      Social History   Socioeconomic History   Marital status: Widowed    Spouse name: Not on file   Number of children: Not on file   Years of education: Not on file   Highest education level: Not on file  Occupational History   Not  on file  Social Needs   Financial resource strain: Not on file   Food insecurity    Worry: Not on file    Inability: Not on file   Transportation needs    Medical: Not on file    Non-medical: Not on file  Tobacco Use   Smoking status: Former Smoker    Packs/day: 1.00    Years: 28.00  Pack years: 28.00    Types: Cigarettes    Start date: 70    Quit date: 1996    Years since quitting: 24.4   Smokeless tobacco: Never Used  Substance and Sexual Activity   Alcohol use: No   Drug use: No   Sexual activity: Not on file  Lifestyle   Physical activity    Days per week: Not on file    Minutes per session: Not on file   Stress: Not on file  Relationships   Social connections    Talks on phone: Not on file    Gets together: Not on file    Attends religious service: Not on file    Active member of club or organization: Not on file    Attends meetings of clubs or organizations: Not on file    Relationship status: Not on file   Intimate partner violence    Fear of current or ex partner: Not on file    Emotionally abused: Not on file    Physically abused: Not on file    Forced sexual activity: Not on file  Other Topics Concern   Not on file  Social History Narrative   Lives in [place]   Attends [employer]   Disabled carpenter      Family History  Problem Relation Age of Onset   Diabetes Mother    Heart disease Mother    Stroke Mother    Heart attack Mother    Heart disease Father    Heart attack Father    Stroke Father    COPD Sister    Congestive Heart Failure Sister    Hypertension Sister    Diabetes Sister    Hypertension Brother    Diabetes Sister    Hypertension Sister    Diabetes Sister    Hypertension Sister     Vitals:   02/07/19 1004  BP: 115/70  Pulse: 78  SpO2: 97%  Weight: 80.8 kg (178 lb 3.2 oz)    Wt Readings from Last 3 Encounters:  02/07/19 80.8 kg (178 lb 3.2 oz)  12/05/18 81.2 kg (179 lb)    12/03/18 83.3 kg (183 lb 10.3 oz)     PHYSICAL EXAM: General:  Appears chronically ill.  No resp difficulty. Arrived in a wheel chair.  HEENT: normal Neck: supple. JVP 7-8. Carotids 2+ bilat; no bruits. No lymphadenopathy or thryomegaly appreciated. Cor: PMI nondisplaced. Irregular rate & rhythm. No rubs, gallops or murmurs. Lungs: clear with decreased breath sounds Abdomen: soft, nontender, nondistended. No hepatosplenomegaly. No bruits or masses. Good bowel sounds. Extremities: no cyanosis, clubbing, rash, edema Neuro: alert & orientedx3, cranial nerves grossly intact. moves all 4 extremities w/o difficulty. Affect pleasant  EKG: A fib 75 bpm Personally reviewed   ASSESSMENT & PLAN:  1.Chronic systolic CHF: ICM, EF 59-16% June 2018 s/p MDT ICD - Echo 6/19 reviewed personally. EF 20%. LV markedly dilated and spherical. RV ok  - Symptoms improved with restoration of NSR -NYHA IIIb. Functional decline and likely worse with A fib.,     COntinue Entresto to 49/51. Watch closely as previously unable to tolerate higher doses due to hypotension. - Continue spiro (despite mild gynecomastia - he does not think [facility] will approve Inspra)  - Continue digoxin 0.125 mg  - Continue Coreg 3.125 mg BID.  - Refuses VAD  2. CAD s/p previous MI - Cath 7/1 with severe 3 v CAD with multiple areas of ISR - s/p 2v PCI  02/22/17 by Dr. Burt Knack. -  Anterior wall is non-viable given previous LAD stent thrombosis. 24-hour Thallium viability study showed minimal or no viability in all segments except for septum - No s/s of ischemia - Off ASA and Brilinta. Continue Eliqiuis 5 mg BID. Denies bleeding - Continue Crestor.   3. PAF - CHADSVASC = 3 (CHF, HTN, CAD) - s/p  DC-CV 03/09/18.  Back in A Fib and with functional decline we need to try to restore NSR to prevent further decompensation.  - Continue Eliquis 5 mg BID and amio 200 mg daily. He has not missed any doses of eliquis.   4  Chronic hypoxic  respiratory failure with large left pleural effusion - S/p thoracentesis in 6/18 - No recurrence. Did not de-sat when walked around office 05/04/18.   5. COPD - Per PCP and pulmonary. No wheeze on exam.  - PFTs 05/04/18 FEV1 3.03 (87%), FVC 77%, TLC 6.14 (87%), DLCO 14.4 (44%) - Ambulated in clinic and O2 sat remained > 96% for the entirety of walking.   6. Hepatitis C - Hepatitis C Antibody positive during work up labs. He states he underwent therapy with Harvoni in 2015 through the [place].   - No further testing warranted at this time with Harvoni. Pt states he can bring his certificate that says he is "cured" if needed.    Discussed with Dr Haroldine Laws. Plan to cardiovert today. Check CBC, BMET now. He has not missed any doses of eliquis.    Darrick Grinder, NP 02/07/19   Patient seen and examined with the above-signed Advanced Practice Provider and/or Housestaff. I personally reviewed laboratory data, imaging studies and relevant notes. I independently examined the patient and formulated the important aspects of the plan. I have edited the note to reflect any of my changes or salient points. I have personally discussed the plan with the patient and/or family.  He is symptomatically worse in the setting of recurrent AF. Now NYHA IIIb. Volume status only mildly elevated. ICD interrogated and shows persistent AF. He has been anticoagulated with Eliquis without interruption. He is on amio 200 bid.   There is no room to do DC-CV in Endoscopy suite.  I have discussed with Dr. Deno Etienne in ER. We will transfer him to ER for labs. If labs stable with perform DC-CV in ER. Increase amio to 400 bid.   Total time spent 40 minutes. Over half that time spent discussing above.    Glori Bickers, MD  4:49 PM

## 2019-02-07 NOTE — Addendum Note (Signed)
Encounter addended by: Jolaine Artist, MD on: 02/07/2019 4:50 PM  Actions taken: Level of Service modified

## 2019-02-07 NOTE — Progress Notes (Signed)
Patient being cardioverted today by Dr Haroldine Laws.

## 2019-02-07 NOTE — Discharge Instructions (Signed)
Return for worsening symptoms.  Follow up with your cardiologist.  

## 2019-02-07 NOTE — ED Triage Notes (Signed)
Patient arrived from his Cardiologist office for Cardioversion. Patient denies chest pain.    He confirmed sister's phone number [phone]; states "she's my ride.

## 2019-02-07 NOTE — ED Notes (Signed)
Patient verbalizes understanding of discharge instructions. Opportunity for questioning and answers were provided. Armband removed by staff, pt discharged from ED.  

## 2019-02-09 ENCOUNTER — Telehealth (HOSPITAL_COMMUNITY): Payer: Self-pay | Admitting: *Deleted

## 2019-02-09 IMAGING — CT CT CHEST W/O CM
2 of 3 series · 15 of 36 positions shown, 18 images · non-contrast
Comparison: Radiograph 02/12/2017, CT 02/09/2017

CLINICAL DATA: Pleural effusion

EXAM:
CT CHEST WITHOUT CONTRAST
TECHNIQUE: Multidetector CT imaging of the chest was performed following the
standard protocol without IV contrast.

[Series 3: chest w/o 2mm st · axial · non-contrast · 0.74mm/px · z∈[+1139,+1435]mm · 12 of 174 slices shown, 15 images]
[im 13/174  mediastinal]
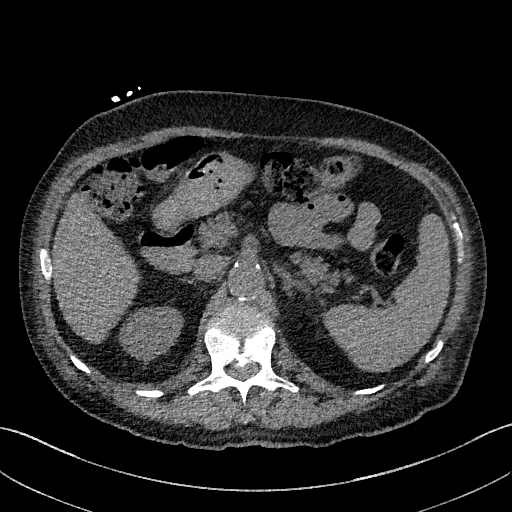
[im 13/174  lung]
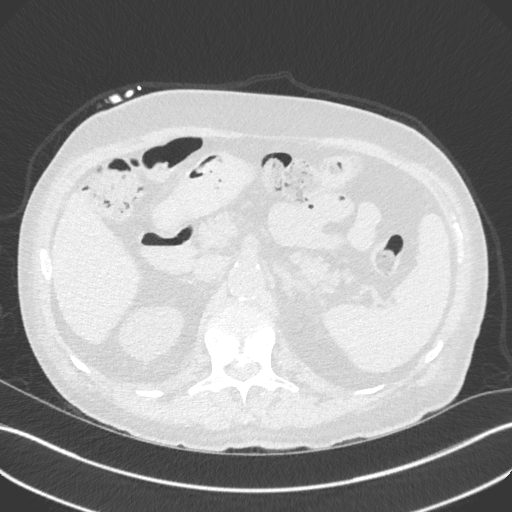
[im 26/174  lung]
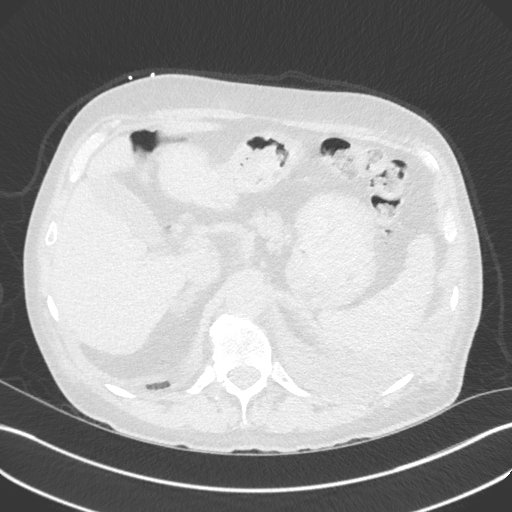
[im 39/174  lung]
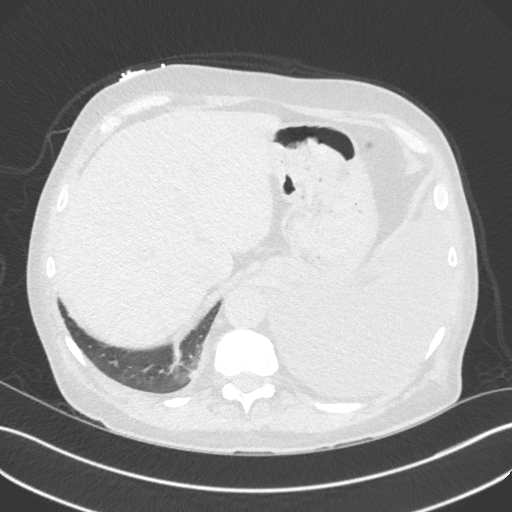
[im 52/174  lung]
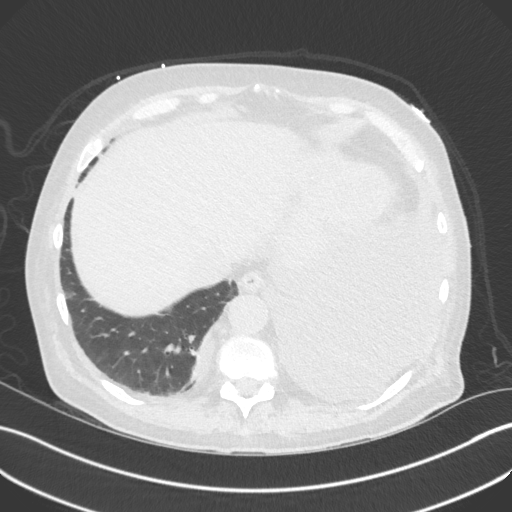
[im 65/174  mediastinal]
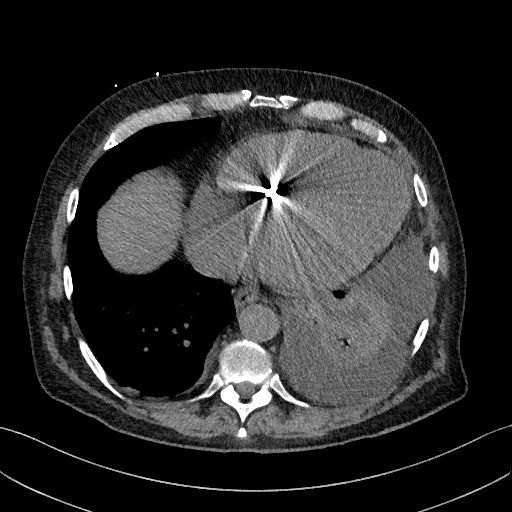
[im 65/174  lung]
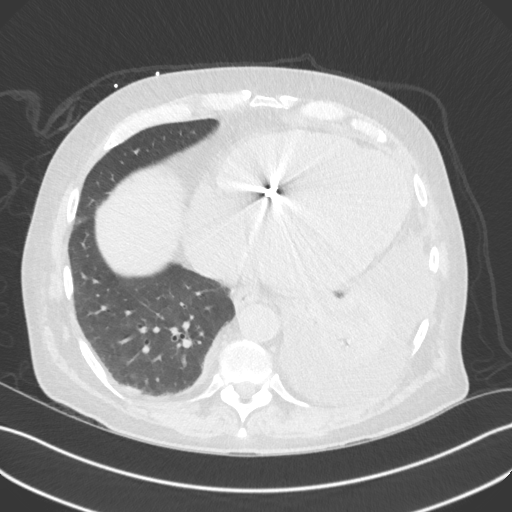
[im 77/174  lung]
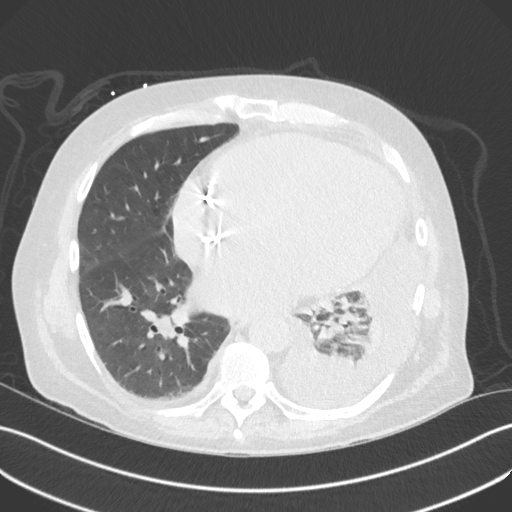
[im 97/174  lung]
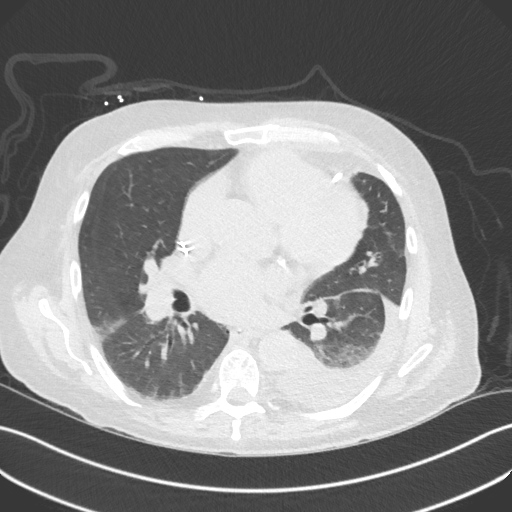
[im 109/174  lung]
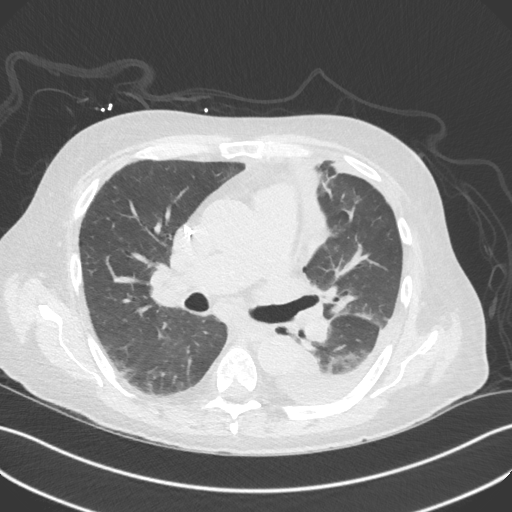
[im 122/174  mediastinal]
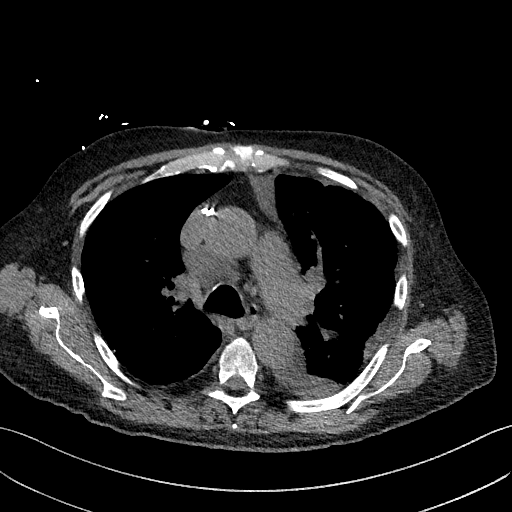
[im 122/174  lung]
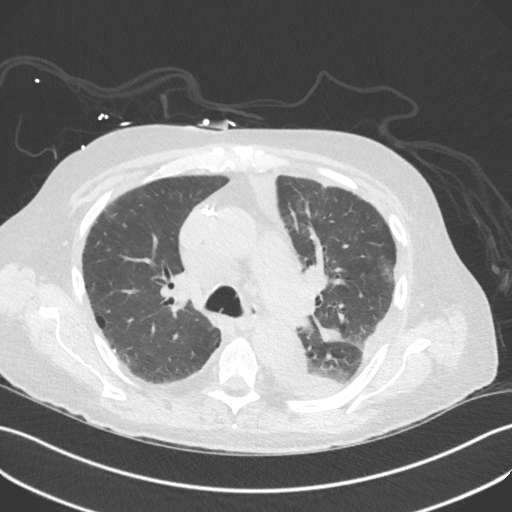
[im 135/174  lung]
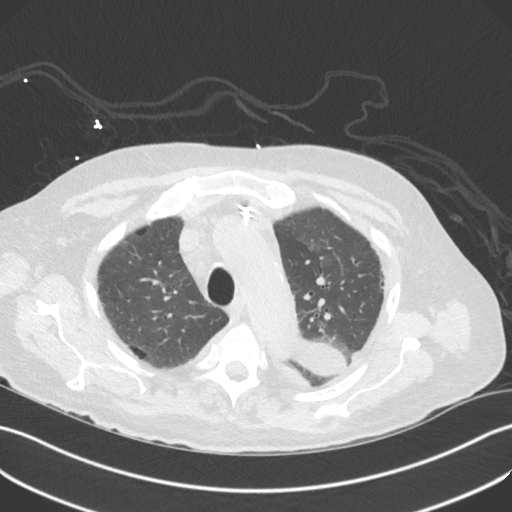
[im 148/174  lung]
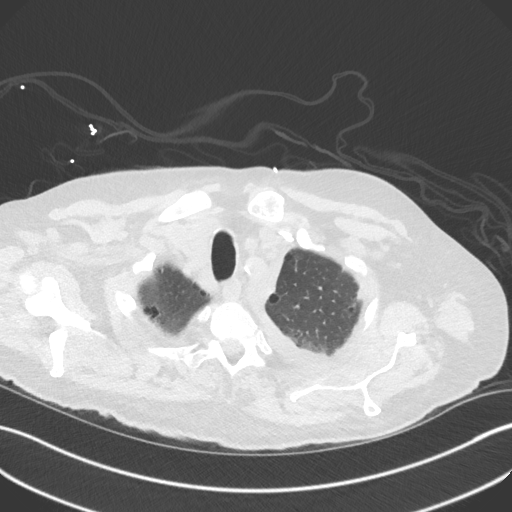
[im 161/174  lung]
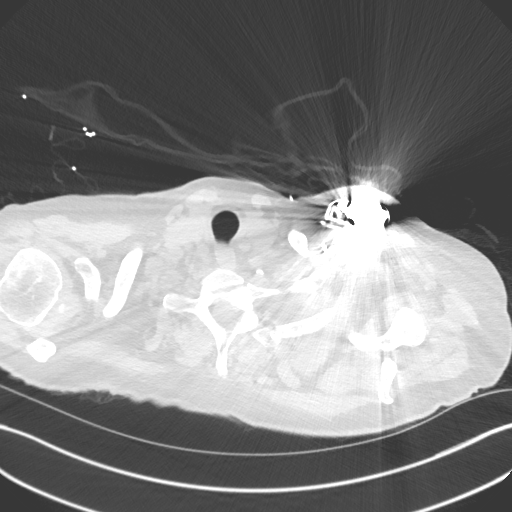

[Series 5: chest w/o 3mm st cor · coronal · non-contrast · 0.68mm/px · 3 of 91 slices shown]
[im 19/91  lung]
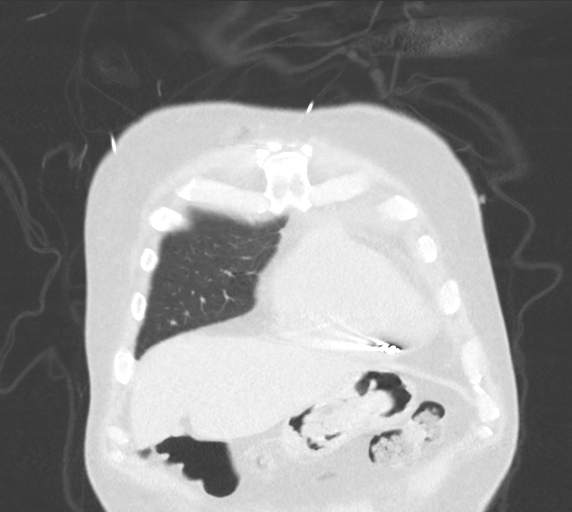
[im 37/91  lung]
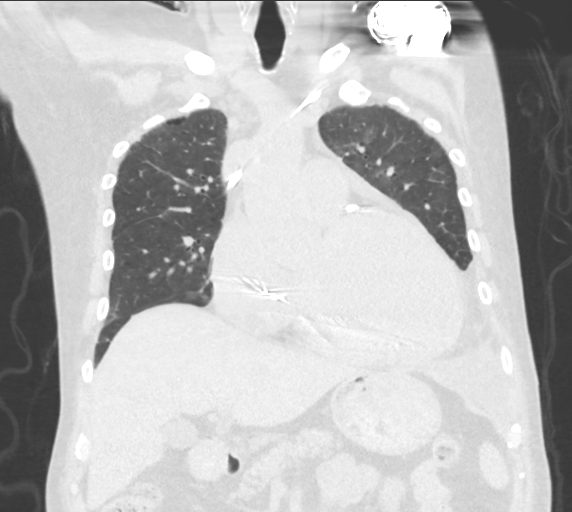
[im 55/91  lung]
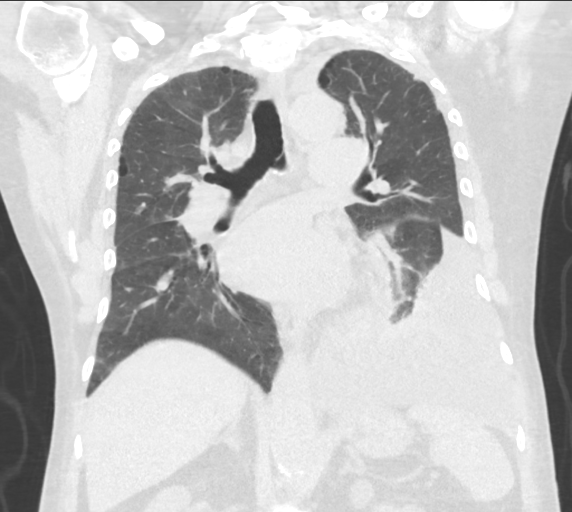

[15 of 36 positions shown; findings below may reference images not displayed]

FINDINGS: Cardiovascular: Limited evaluation without intravenous contrast.
Aortic atherosclerosis. No aneurysmal dilatation. Ectasia of the
ascending segment. Enlarged pulmonary arteries. Intracardiac pacing
leads. Mild cardiomegaly. No large pericardial effusion. Coronary
artery calcification.

Mediastinum/Nodes: Midline trachea. No thyroid mass. Bulky
mediastinal adenopathy re- demonstrated, right paratracheal node or
mass measures 3.5 cm compared with 3.5 cm previously. Esophagus
grossly unremarkable.

Lungs/Pleura: Emphysema at the upper lobes. No focal consolidation
or significant effusion on the right. Decreased pleural effusion in
the left upper chest with some loculation at the apex and fluid
along the fissure. Moderate subpulmonic pleural effusion remains.
Consolidation within the left lower lobe and lingula may reflect
atelectasis or pneumonia

Upper Abdomen: No acute abnormality. Partially visualized probable
1.9 cm cyst in the mid right kidney.

Musculoskeletal: Moderate severe compression fracture of L1,
uncertain chronicity, with retropulsion of 7 mm on sagittal views.
Estimated 50% loss of vertebral body height anteriorly. No other
significant osseous abnormality.
IMPRESSION: 1. Slight decreased pleural effusion at the left upper lobe with
fluid tracking along the left pulmonary fissure. Moderate residual
left pleural effusion at the lung base, largely subpulmonic.
2. No change in bulky mediastinal adenopathy, which may be secondary
to infection, inflammation, or neoplasm. Correlation with PET-CT or
possible tissue sampling previously recommended.
3. Patchy consolidation in the lingula and left lower lobe may
reflect atelectasis or pneumonia
4. Moderate severe compression fracture of L1, uncertain chronicity,
with 7 mm of retropulsion.

Aortic Atherosclerosis (JXZWL-EI3.3) and Emphysema (JXZWL-199.2).

## 2019-02-09 IMAGING — DX DG CHEST 1V PORT
1 series · 1 of 1 positions shown · non-contrast
Comparison: 02/10/2017

CLINICAL DATA: Shortness of breath, 4 days duration.  Follow-up.

EXAM:
PORTABLE CHEST 1 VIEW

[chest ap]
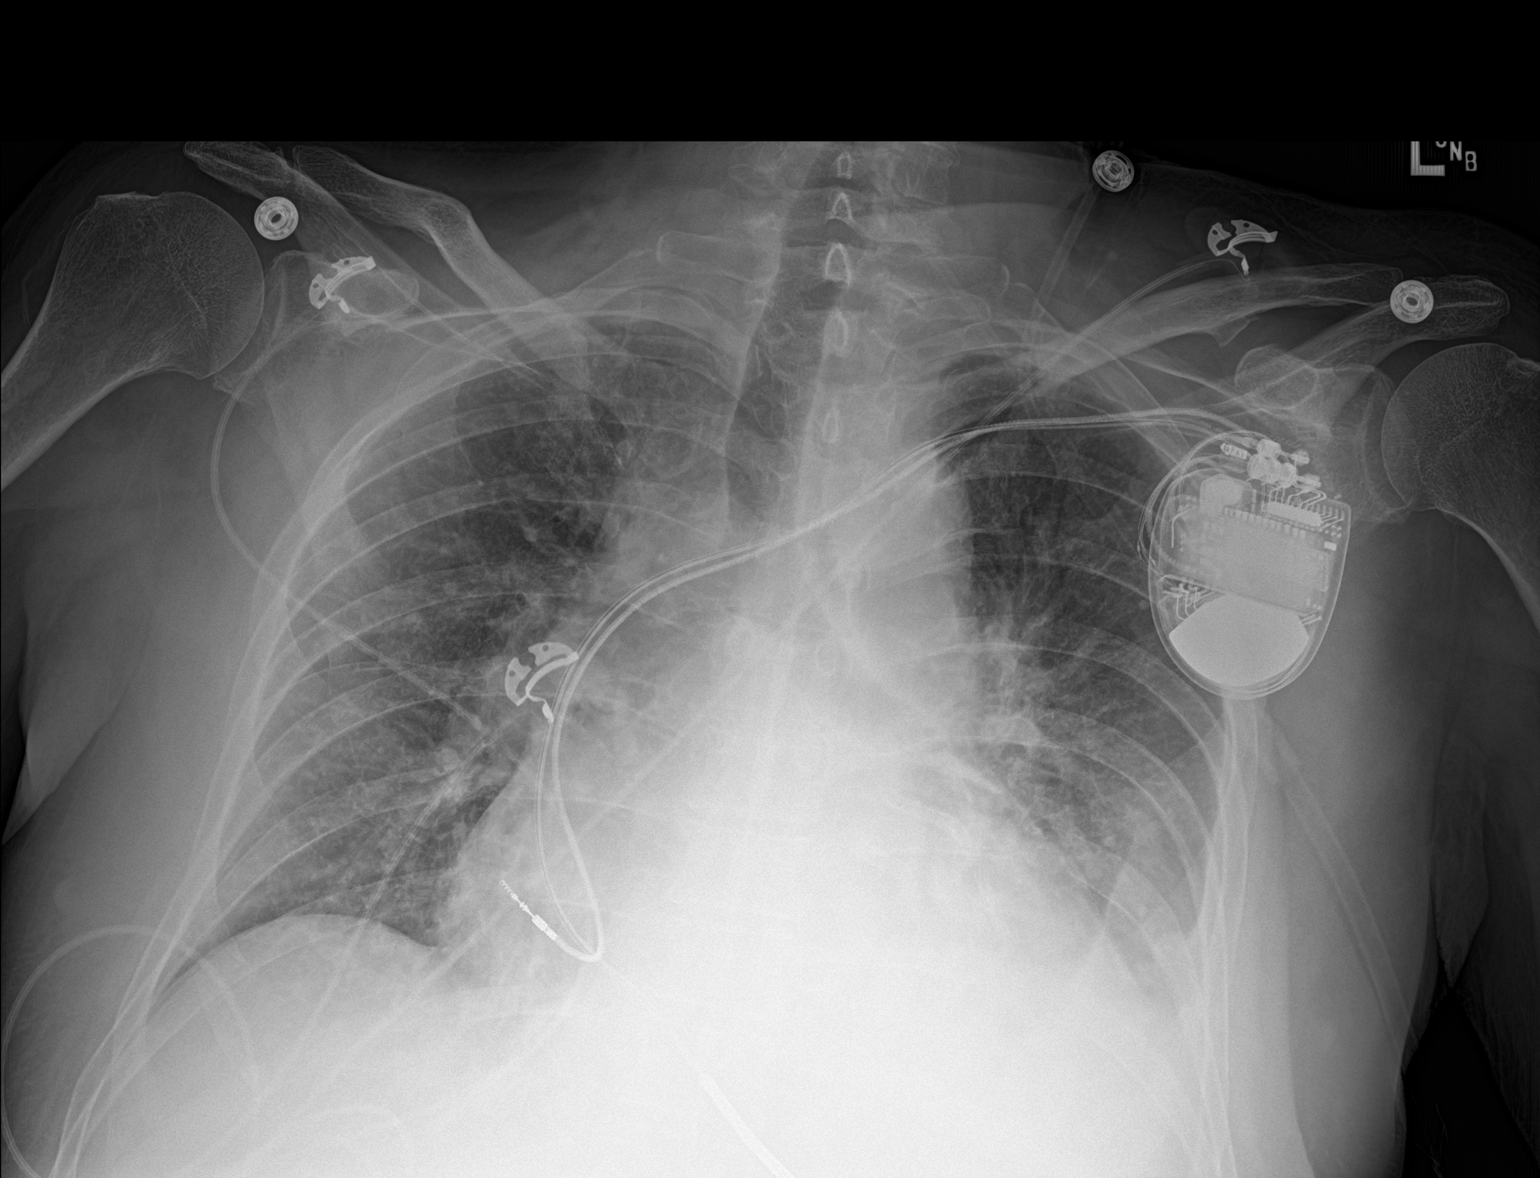

[1 of 1 positions shown; findings below may reference images not displayed]

FINDINGS: Pacemaker/AID appears unchanged. Cardiomegaly persists. Persistent
small to moderate size left effusion with left base atelectasis.
Venous hypertension with early interstitial edema.
IMPRESSION: Similar appearance to the study of 2 days ago. Persistent small to
moderate size left effusion with left base atelectasis. Mild
interstitial edema.

## 2019-02-09 NOTE — Telephone Encounter (Signed)
lft vcml for pt to [facility] to sched follow up.  Pt in ED with afib rvr

## 2019-02-13 ENCOUNTER — Ambulatory Visit (HOSPITAL_COMMUNITY): Payer: Medicare Other | Admitting: Nurse Practitioner

## 2019-02-14 NOTE — Progress Notes (Signed)
No ICM remote transmission received for 02/12/2019 and next ICM transmission scheduled for 03/06/2019.

## 2019-02-19 ENCOUNTER — Other Ambulatory Visit: Payer: Self-pay

## 2019-02-19 ENCOUNTER — Ambulatory Visit (HOSPITAL_COMMUNITY)
Admission: RE | Admit: 2019-02-19 | Discharge: 2019-02-19 | Disposition: A | Discharge status: Home / Self Care | Payer: Medicare Other | Source: Ambulatory Visit | Attending: Nurse Practitioner | Admitting: Nurse Practitioner

## 2019-02-19 ENCOUNTER — Encounter (HOSPITAL_COMMUNITY): Payer: Self-pay | Admitting: Nurse Practitioner

## 2019-02-19 VITALS — BP 142/82 | HR 75 | Ht 68.0 in | Wt 182.0 lb

## 2019-02-19 DIAGNOSIS — Z79899 Other long term (current) drug therapy: Secondary | ICD-10-CM | POA: Insufficient documentation

## 2019-02-19 DIAGNOSIS — G4733 Obstructive sleep apnea (adult) (pediatric): Secondary | ICD-10-CM | POA: Insufficient documentation

## 2019-02-19 DIAGNOSIS — R9431 Abnormal electrocardiogram [ECG] [EKG]: Secondary | ICD-10-CM | POA: Insufficient documentation

## 2019-02-19 DIAGNOSIS — Z7901 Long term (current) use of anticoagulants: Secondary | ICD-10-CM | POA: Insufficient documentation

## 2019-02-19 DIAGNOSIS — Z833 Family history of diabetes mellitus: Secondary | ICD-10-CM | POA: Insufficient documentation

## 2019-02-19 DIAGNOSIS — Z888 Allergy status to other drugs, medicaments and biological substances status: Secondary | ICD-10-CM | POA: Insufficient documentation

## 2019-02-19 DIAGNOSIS — I480 Paroxysmal atrial fibrillation: Secondary | ICD-10-CM | POA: Insufficient documentation

## 2019-02-19 DIAGNOSIS — I447 Left bundle-branch block, unspecified: Secondary | ICD-10-CM | POA: Diagnosis not present

## 2019-02-19 DIAGNOSIS — Z95810 Presence of automatic (implantable) cardiac defibrillator: Secondary | ICD-10-CM | POA: Diagnosis not present

## 2019-02-19 DIAGNOSIS — I2510 Atherosclerotic heart disease of native coronary artery without angina pectoris: Secondary | ICD-10-CM | POA: Diagnosis not present

## 2019-02-19 DIAGNOSIS — I255 Ischemic cardiomyopathy: Secondary | ICD-10-CM | POA: Insufficient documentation

## 2019-02-19 DIAGNOSIS — Z87891 Personal history of nicotine dependence: Secondary | ICD-10-CM | POA: Diagnosis not present

## 2019-02-19 DIAGNOSIS — I110 Hypertensive heart disease with heart failure: Secondary | ICD-10-CM | POA: Insufficient documentation

## 2019-02-19 DIAGNOSIS — I252 Old myocardial infarction: Secondary | ICD-10-CM | POA: Insufficient documentation

## 2019-02-19 DIAGNOSIS — I5022 Chronic systolic (congestive) heart failure: Secondary | ICD-10-CM | POA: Insufficient documentation

## 2019-02-19 DIAGNOSIS — E785 Hyperlipidemia, unspecified: Secondary | ICD-10-CM | POA: Diagnosis not present

## 2019-02-19 DIAGNOSIS — J449 Chronic obstructive pulmonary disease, unspecified: Secondary | ICD-10-CM | POA: Insufficient documentation

## 2019-02-19 DIAGNOSIS — Z885 Allergy status to narcotic agent status: Secondary | ICD-10-CM | POA: Diagnosis not present

## 2019-02-19 DIAGNOSIS — Z8249 Family history of ischemic heart disease and other diseases of the circulatory system: Secondary | ICD-10-CM | POA: Diagnosis not present

## 2019-02-19 DIAGNOSIS — E039 Hypothyroidism, unspecified: Secondary | ICD-10-CM | POA: Diagnosis not present

## 2019-02-19 NOTE — Progress Notes (Addendum)
Primary Care Physician: Administration, Veterans Referring Physician:Dr Bensimhon    Stephen Cardenas is a 63 y.o. male with a h/o chronic systolic CHF (EF 99%) due to ischemic cardiomyopathy with a history of MI. AICD, COPD, OSA on CPAP, HTN, PAF, and hypothyroidism.   He has a history of multiple MI's. One in 2007 and one in 2014 that resulted in cardiogenic shock and a prolonged ICU hospitalization.   He has recently been evaluated by Dr. Prescott Gum for VAD but pt declined. When he was seen by Darrick Grinder, NP 6/24, he was c/o of increasing shortness of breath. He was found to be in afib and sent to ER for cardioversion which was successful.  He is being seen today in the afib clinic  for f/u DCCV and EKG shows SR. He, however, does not feel improved in SR and still feels weak and short of breath.His weight is up 4 lbs from last visit in HF clinic..   Today, he denies symptoms of palpitations, chest pain, shortness of breath, orthopnea, PND, lower extremity edema, dizziness, presyncope, syncope, or neurologic sequela.+ for weakness, shortness of breath. The patient is tolerating medications without difficulties and is otherwise without complaint today.   Past Medical History:  Diagnosis Date  . AICD (automatic cardioverter/defibrillator) present 2014  . CAD (coronary artery disease)    S/p multiple prior MIs/PCIs // amdx in 2014 with acute stent thrombosis c/b CGS // Admx to [facility] in 10/7167 2/2 a/c systolic HF >> [facility] 01/21/88: mLAD 95 ISR, dLAD 30, D2 99; OM2 95 ISR; pRCA 30/50, mRCA 90/80, dRCA 30 EF 15 >> min to no viability on Thallium study (not surgical candidate) - PCI>> 3.5 x 20 mm Promus DES to the LCx 3 x 38 mm Promus DES to the proximal mid RCA  . Chronic systolic heart failure (HCC)    2/2 to ischemic CM - Echo 02/12/17:  Mild LVH, EF 15-20, mod to severe MR, severe LAE, mild RVE, mod reduced RVSF, mild RAE, mod TR, PASP 65  . COPD (chronic obstructive pulmonary disease) ([place])   .  History of MI (myocardial infarction)   . History of pneumonia   . HTN (hypertension)   . Hyperlipidemia 03/08/2017  . Hypothyroidism   . OSA (obstructive sleep apnea) 03/08/2017   Past Surgical History:  Procedure Laterality Date  . CARDIAC DEFIBRILLATOR PLACEMENT  2014   Medtronic Every XT DR ICD implanted at the [facility] for primary prevention  . CARDIOVERSION N/A 03/09/2018   Procedure: CARDIOVERSION;  Surgeon: Jolaine Artist, MD;  Location: [facility] ENDOSCOPY;  Service: Cardiovascular;  Laterality: N/A;  . CORONARY STENT INTERVENTION N/A 02/22/2017   Procedure: Coronary Stent Intervention;  Surgeon: Sherren Mocha, MD;  Location: [place] CV LAB;  Service: Cardiovascular;  Laterality: N/A;  CFX and RCA  . FACIAL RECONSTRUCTION SURGERY Right 1984  . FEMUR FRACTURE SURGERY Right 1984  . RIGHT/LEFT HEART CATH AND CORONARY ANGIOGRAPHY N/A 02/14/2017   Procedure: Right/Left Heart Cath and Coronary Angiography;  Surgeon: Jolaine Artist, MD;  Location: [place] CV LAB;  Service: Cardiovascular;  Laterality: N/A;  . WRIST FRACTURE SURGERY Right 2015    Current Outpatient Medications  Medication Sig Dispense Refill  . ALPRAZolam (XANAX) 0.5 MG tablet Take 0.5-1 mg by mouth at bedtime.     Marland Kitchen amiodarone (PACERONE) 200 MG tablet Take 200 mg by mouth daily.    Marland Kitchen apixaban (ELIQUIS) 5 MG TABS tablet Take 1 tablet (5 mg total) by mouth 2 (  two) times daily. 60 tablet 6  . ascorbic acid (VITAMIN C) 500 MG tablet Take 500 mg by mouth daily.    Marland Kitchen aspirin EC 81 MG tablet Take 81 mg by mouth daily.    Marland Kitchen atorvastatin (LIPITOR) 20 MG tablet Take 20 mg by mouth at bedtime. Pt taking about 3 times a week    . carvedilol (COREG) 3.125 MG tablet Take 1 tablet (3.125 mg total) by mouth 2 (two) times daily with a meal. 60 tablet 3  . Cholecalciferol (VITAMIN D3) 2000 units TABS Take 2,000 Units by mouth daily.    . digoxin (LANOXIN) 0.125 MG tablet Take 1 tablet (0.125 mg total) by mouth every other  day. 45 tablet 3  . DULoxetine (CYMBALTA) 30 MG capsule Take 90 mg by mouth at bedtime.     . Ferrous Sulfate (IRON) 325 (65 Fe) MG TABS TAKE 1 TABLET BY MOUTH TWICE DAILY WITH A MEAL 60 tablet 3  . furosemide (LASIX) 20 MG tablet Take 20 mg by mouth 2 (two) times daily.    Marland Kitchen levothyroxine (SYNTHROID) 200 MCG tablet Take 1 tablet (200 mcg total) by mouth daily at 6 (six) AM. 30 tablet 1  . Multiple Vitamins-Minerals (MULTIVITAMIN PO) Take 1 tablet by mouth daily.    . pantoprazole (PROTONIX) 40 MG tablet Take 1 tablet (40 mg total) by mouth at bedtime. 30 tablet 6  . potassium chloride (K-DUR) 10 MEQ tablet Take 10 mEq by mouth 2 (two) times daily.    . sacubitril-valsartan (ENTRESTO) 49-51 MG Take 1 tablet by mouth 2 (two) times daily. 60 tablet 6  . spironolactone (ALDACTONE) 25 MG tablet Take 1 tablet (25 mg total) by mouth daily. 30 tablet 3  . vitamin B-12 (CYANOCOBALAMIN) 1000 MCG tablet Take 2,000 mcg by mouth daily.     No current facility-administered medications for this encounter.     Allergies  Allergen Reactions  . Codeine Other (See Comments) and Nausea Only    Increased work of breathing, diaphoretic  . Mirtazapine     Other reaction(s): Other - See Comments  . Prednisone     Anxiety and paranoia    Social History   Socioeconomic History  . Marital status: Widowed    Spouse name: Not on file  . Number of children: Not on file  . Years of education: Not on file  . Highest education level: Not on file  Occupational History  . Not on file  Social Needs  . Financial resource strain: Not on file  . Food insecurity    Worry: Not on file    Inability: Not on file  . Transportation needs    Medical: Not on file    Non-medical: Not on file  Tobacco Use  . Smoking status: Former Smoker    Packs/day: 1.00    Years: 28.00    Pack years: 28.00    Types: Cigarettes    Start date: 1968    Quit date: 1996    Years since quitting: 24.5  . Smokeless tobacco: Never  Used  Substance and Sexual Activity  . Alcohol use: No  . Drug use: No  . Sexual activity: Not on file  Lifestyle  . Physical activity    Days per week: Not on file    Minutes per session: Not on file  . Stress: Not on file  Relationships  . Social Herbalist on phone: Not on file    Gets together: Not on file  Attends religious service: Not on file    Active member of club or organization: Not on file    Attends meetings of clubs or organizations: Not on file    Relationship status: Not on file  . Intimate partner violence    Fear of current or ex partner: Not on file    Emotionally abused: Not on file    Physically abused: Not on file    Forced sexual activity: Not on file  Other Topics Concern  . Not on file  Social History Narrative   Lives in [place]   Attends [employer]   Disabled carpenter    Family History  Problem Relation Age of Onset  . Diabetes Mother   . Heart disease Mother   . Stroke Mother   . Heart attack Mother   . Heart disease Father   . Heart attack Father   . Stroke Father   . COPD Sister   . Congestive Heart Failure Sister   . Hypertension Sister   . Diabetes Sister   . Hypertension Brother   . Diabetes Sister   . Hypertension Sister   . Diabetes Sister   . Hypertension Sister     ROS- All systems are reviewed and negative except as per the HPI above  Physical Exam: Vitals:   02/19/19 1143  BP: (!) 142/82  Pulse: 75  Weight: 82.6 kg  Height: 5\' 8"  (1.727 m)   Wt Readings from Last 3 Encounters:  02/19/19 82.6 kg  02/07/19 80 kg  02/07/19 80.8 kg    Labs: Lab Results  Component Value Date   NA 141 02/07/2019   K 4.2 02/07/2019   CL 106 02/07/2019   CO2 24 02/07/2019   GLUCOSE 116 (H) 02/07/2019   BUN 31 (H) 02/07/2019   CREATININE 1.52 (H) 02/07/2019   CALCIUM 9.2 02/07/2019   PHOS 4.4 02/09/2017   MG 2.2 12/01/2018   Lab Results  Component Value Date   INR 1.25 05/04/2018   Lab Results   Component Value Date   CHOL 179 05/04/2018   HDL 38 (L) 05/04/2018   LDLCALC 99 05/04/2018   TRIG 212 (H) 05/04/2018     GEN- The patient is well appearing, alert and oriented x 3 today.   Head- normocephalic, atraumatic Eyes-  Sclera clear, conjunctiva pink Ears- hearing intact Oropharynx- clear Neck- supple, no JVP Lymph- no cervical lymphadenopathy Lungs- Clear to ausculation bilaterally, normal work of breathing Heart- regular rate and rhythm, no murmurs, rubs or gallops, PMI not laterally displaced GI- soft, NT, ND, + BS Extremities- no clubbing, cyanosis, or edema MS- no significant deformity or atrophy Skin- no rash or lesion Psych- euthymic mood, full affect Neuro- strength and sensation are intact  EKG- sinus rhythm at 75 bpm, LAD, NS IV block qrs int 182 ms, qtc 480 ms Epic records reviewed    Assessment and Plan: 1. Paroxysmal afib  Successful cardioversion 6/24 and is in  rhythm today but his symptoms of shortness of breath and weakness have not improved  Continue  on amiodarone 200 mg qd Continue on apixaban 5 mg bid for CHA2DS2VASc score of 3  2. Chronic systolic CHF Weight is up 4 lbs and may be contributing to his symptoms I discussed with Darrick Grinder, NP Will increase lasix from 40 mg daily to 60 mg daily x 3 days, then return to 40 mg daily  F/u as scheduled with HF clinic 7/22  Butch Penny C. Kayleen Memos, ANP-C Afib  [facility] [address] [place], [place] [address] [phone]

## 2019-02-19 NOTE — Patient Instructions (Signed)
Increase lasix to 60mg  daily for 3 days then reduce back to 40mg  a day.

## 2019-03-06 ENCOUNTER — Ambulatory Visit (INDEPENDENT_AMBULATORY_CARE_PROVIDER_SITE_OTHER): Payer: Medicare Other

## 2019-03-06 DIAGNOSIS — Z95810 Presence of automatic (implantable) cardiac defibrillator: Secondary | ICD-10-CM | POA: Diagnosis not present

## 2019-03-06 DIAGNOSIS — I5022 Chronic systolic (congestive) heart failure: Secondary | ICD-10-CM

## 2019-03-06 NOTE — Progress Notes (Signed)
EPIC Encounter for ICM Monitoring  Patient Name: Stephen Cardenas is a 62 y.o. male Date: 03/06/2019 Primary Care Physican: Administration, Veterans Primary Cardiologist:Bensimhon Electrophysiologist:Allred 01/03/2019 Weight: 163 lbs 01/31/2019 Weight: 180 lbs 02/06/2019 Weight: 175 lbs  03/06/2019 Weight: 175 lbs  07-Feb-2019 to 06-Mar-2019 Time in AT/AF<0.1 hr/day (<0.1%)     Spoke with patient.  He had cardioversion 02/07/2019.   He reports feeling about the same which is usually tired and no energy.  He reports he follows a low salt diet.  Unable to determine reason for possible fluid accumulation.    OptiVolThoracic impedanceremains abnormal suggesting possible fluid accumulation since 5/24 and index > threshold since 6/6.Report suggests he remains in NSR since cardioversion.  Prescribed:Furosemide20 mgtake2tablets(40 mg total)twice a day.Potassium 20 mEq take2tabletstwice a day.  Labs: 08/31/2018 Creatinine1.34, BUN14, Potassium4.1, [id number], GFR56->60  07/02/2018 Creatinine1.16, BUN11, Potassium4.1, Sodium135, GFR>60 05/04/2018 Creatinine1.20, BUN9,Potassium3.3, Sodium137, GFR>60  A complete set of results can be found in Results Review.  Recommendations:Advised to continue to limit salt intake and any recommendations will be given at the office appt tomorrow.   Follow-up plan: ICM clinic phone appointment on8/3/2020recheck fluid levels. Office visit with HF clinic 03/07/2019 and confirmed with patient he will be at the appt.  Copy of ICM check sent to Dr.Allred, Dr Haroldine Laws and Darrick Grinder, NP for 7/22 HF clinic appt.   AT/AF   3 month ICM trend: 03/06/2019    1 Year ICM trend:       [name], RN 03/06/2019 12:46 PM

## 2019-03-07 ENCOUNTER — Inpatient Hospital Stay (HOSPITAL_COMMUNITY): Payer: Medicare Other

## 2019-03-19 ENCOUNTER — Ambulatory Visit (INDEPENDENT_AMBULATORY_CARE_PROVIDER_SITE_OTHER): Payer: Medicare Other

## 2019-03-19 DIAGNOSIS — Z95810 Presence of automatic (implantable) cardiac defibrillator: Secondary | ICD-10-CM

## 2019-03-19 DIAGNOSIS — I5022 Chronic systolic (congestive) heart failure: Secondary | ICD-10-CM

## 2019-03-21 NOTE — Progress Notes (Signed)
EPIC Encounter for ICM Monitoring  Patient Name: Stephen Cardenas is a 63 y.o. male Date: 03/21/2019 Primary Care Physican: Administration, Veterans Primary Cardiologist:Bensimhon Electrophysiologist:Allred 02/06/2019 Weight: 175 lbs  03/06/2019 Weight: 175 lbs 03/21/2019 Weight: 166 lbs  Spoke with patient.He said he is feeling better since his weight has decreased by about 9 lbs since 7/21.   Weight of 166 lbs is closer to his baseline.        OptiVolthoracic impedance has returned to baseline normal.  Prescribed:Furosemide20 mgtake2tablets(40 mg total)twice a day.Potassium 20 mEq take2tabletstwice a day.  Labs: 02/07/2019 Creatinine 1.52, BUN 31, Potassium 4.2, Sodium 141, GFR 48-55 12/03/2018 Creatinine 1.25, BUN 27, Potassium 3.8, Sodium 136, GFR >60  12/02/2018 Creatinine 1.24, BUN 24, Potassium 4.0, Sodium 137, GFR >60  12/01/2018 Creatinine 1.27, BUN 18, Potassium 4.0, Sodium 137, GFR 59->60  A complete set of results can be found in Results Review.  Recommendations: Advised to call when experiencing fluid symptoms.   Follow-up plan: ICM clinic phone appointment on10/12/2018 since he has office defib check on 9/4. Office visit with HF clinic 03/22/2019 and Dr Rayann Heman 04/20/2019.  Copy of ICM check sent to Dr.Allred.  3 month ICM trend: 03/19/2019    1 Year ICM trend:       [name], RN 03/21/2019 10:39 AM

## 2019-03-22 ENCOUNTER — Other Ambulatory Visit: Payer: Self-pay

## 2019-03-22 ENCOUNTER — Encounter (HOSPITAL_COMMUNITY): Payer: Self-pay

## 2019-03-22 ENCOUNTER — Ambulatory Visit (HOSPITAL_COMMUNITY)
Admission: RE | Admit: 2019-03-22 | Discharge: 2019-03-22 | Disposition: A | Discharge status: Home / Self Care | Payer: Medicare Other | Source: Ambulatory Visit | Attending: Cardiology | Admitting: Cardiology

## 2019-03-22 VITALS — BP 114/68 | HR 64 | Wt 169.8 lb

## 2019-03-22 DIAGNOSIS — Z888 Allergy status to other drugs, medicaments and biological substances status: Secondary | ICD-10-CM | POA: Insufficient documentation

## 2019-03-22 DIAGNOSIS — I252 Old myocardial infarction: Secondary | ICD-10-CM | POA: Insufficient documentation

## 2019-03-22 DIAGNOSIS — I2510 Atherosclerotic heart disease of native coronary artery without angina pectoris: Secondary | ICD-10-CM | POA: Insufficient documentation

## 2019-03-22 DIAGNOSIS — I480 Paroxysmal atrial fibrillation: Secondary | ICD-10-CM | POA: Insufficient documentation

## 2019-03-22 DIAGNOSIS — Z87891 Personal history of nicotine dependence: Secondary | ICD-10-CM | POA: Diagnosis not present

## 2019-03-22 DIAGNOSIS — I110 Hypertensive heart disease with heart failure: Secondary | ICD-10-CM | POA: Diagnosis not present

## 2019-03-22 DIAGNOSIS — Z79890 Hormone replacement therapy (postmenopausal): Secondary | ICD-10-CM | POA: Diagnosis not present

## 2019-03-22 DIAGNOSIS — Z95810 Presence of automatic (implantable) cardiac defibrillator: Secondary | ICD-10-CM | POA: Diagnosis not present

## 2019-03-22 DIAGNOSIS — J90 Pleural effusion, not elsewhere classified: Secondary | ICD-10-CM | POA: Insufficient documentation

## 2019-03-22 DIAGNOSIS — Z7982 Long term (current) use of aspirin: Secondary | ICD-10-CM | POA: Diagnosis not present

## 2019-03-22 DIAGNOSIS — Z883 Allergy status to other anti-infective agents status: Secondary | ICD-10-CM | POA: Diagnosis not present

## 2019-03-22 DIAGNOSIS — I255 Ischemic cardiomyopathy: Secondary | ICD-10-CM | POA: Diagnosis not present

## 2019-03-22 DIAGNOSIS — Z8619 Personal history of other infectious and parasitic diseases: Secondary | ICD-10-CM | POA: Insufficient documentation

## 2019-03-22 DIAGNOSIS — Z955 Presence of coronary angioplasty implant and graft: Secondary | ICD-10-CM | POA: Diagnosis not present

## 2019-03-22 DIAGNOSIS — J449 Chronic obstructive pulmonary disease, unspecified: Secondary | ICD-10-CM | POA: Insufficient documentation

## 2019-03-22 DIAGNOSIS — I5022 Chronic systolic (congestive) heart failure: Secondary | ICD-10-CM | POA: Diagnosis not present

## 2019-03-22 DIAGNOSIS — J9611 Chronic respiratory failure with hypoxia: Secondary | ICD-10-CM | POA: Diagnosis not present

## 2019-03-22 DIAGNOSIS — E039 Hypothyroidism, unspecified: Secondary | ICD-10-CM | POA: Diagnosis not present

## 2019-03-22 DIAGNOSIS — I447 Left bundle-branch block, unspecified: Secondary | ICD-10-CM | POA: Diagnosis not present

## 2019-03-22 DIAGNOSIS — E785 Hyperlipidemia, unspecified: Secondary | ICD-10-CM | POA: Insufficient documentation

## 2019-03-22 DIAGNOSIS — G4733 Obstructive sleep apnea (adult) (pediatric): Secondary | ICD-10-CM | POA: Diagnosis not present

## 2019-03-22 DIAGNOSIS — Z7901 Long term (current) use of anticoagulants: Secondary | ICD-10-CM | POA: Insufficient documentation

## 2019-03-22 DIAGNOSIS — Z79899 Other long term (current) drug therapy: Secondary | ICD-10-CM | POA: Diagnosis not present

## 2019-03-22 DIAGNOSIS — Z885 Allergy status to narcotic agent status: Secondary | ICD-10-CM | POA: Insufficient documentation

## 2019-03-22 DIAGNOSIS — Z833 Family history of diabetes mellitus: Secondary | ICD-10-CM | POA: Insufficient documentation

## 2019-03-22 DIAGNOSIS — I10 Essential (primary) hypertension: Secondary | ICD-10-CM | POA: Diagnosis not present

## 2019-03-22 DIAGNOSIS — R9431 Abnormal electrocardiogram [ECG] [EKG]: Secondary | ICD-10-CM | POA: Insufficient documentation

## 2019-03-22 DIAGNOSIS — Z8249 Family history of ischemic heart disease and other diseases of the circulatory system: Secondary | ICD-10-CM | POA: Insufficient documentation

## 2019-03-22 DIAGNOSIS — I454 Nonspecific intraventricular block: Secondary | ICD-10-CM | POA: Insufficient documentation

## 2019-03-22 LAB — BASIC METABOLIC PANEL
Anion gap: 11 (ref 5–15)
BUN: 13 mg/dL (ref 8–23)
CO2: 27 mmol/L (ref 22–32)
Calcium: 9.4 mg/dL (ref 8.9–10.3)
Chloride: 98 mmol/L (ref 98–111)
Creatinine, Ser: 1.09 mg/dL (ref 0.61–1.24)
GFR calc Af Amer: >60 mL/min (ref >60)
GFR calc non Af Amer: >60 mL/min (ref >60)
Glucose, Bld: 100 mg/dL — ABNORMAL HIGH (ref 70–99)
Potassium: 4 mmol/L (ref 3.5–5.1)
Sodium: 136 mmol/L (ref 135–145)

## 2019-03-22 NOTE — Patient Instructions (Addendum)
EKG was completed.  Labs were done today. We will call you with any ABNORMAL results. No news is good news!  Your physician recommends that you schedule a follow-up appointment in: 3 months with Dr Haroldine Laws  At the [facility], you and your health needs are our priority. As part of our continuing mission to provide you with exceptional heart care, we have created designated Provider Care Teams. These Care Teams include your primary Cardiologist (physician) and Advanced Practice Providers (APPs- Physician Assistants and Nurse Practitioners) who all work together to provide you with the care you need, when you need it.   You may see any of the following providers on your designated Care Team at your next follow up: Marland Kitchen Dr Glori Bickers . Dr Loralie Champagne . Darrick Grinder, NP   Please be sure to bring in all your medications bottles to every appointment.

## 2019-03-22 NOTE — Progress Notes (Signed)
Advanced Heart Failure Clinic Note   Referring Physician: Dr. Orpah Greek, [facility] Primary Care: Dr. Clementeen Graham Primary Cardiologist: New to Dr. Haroldine Laws.   HPI: Stephen Cardenas is a 63 y.o. male with a past medical history of chronic systolic CHF (EF 16%) due to ischemic cardiomyopathy with a history of MI. AICD, COPD, OSA on CPAP, HTN, PAF, and hypothyroidism.   He has a history of multiple MI's. One in 2007 and one in 2014 that resulted in cardiogenic shock and a prolonged ICU hospitalization.   He was admitted to [facility] in June 2018 with hypoglycemia and discharged to Rehab. He was discharged from [facility] but quickly readmitted to [facility] with chest pain and SOB. He was told that he had a fluid on his lung and then transferred back to rehab and eventually discharged on 02/07/17. He presented to the ED at [facility] on 02/09/17 with SOB and weakness. Chest X ray with large pleural effusion, he was therefore transferred to [facility] where he underwent a left thoracentesis with transudative fluid.   He underwent repeat right and left heart cath on 02/14/17 that showed severe 3 vessel disease with multiple areas of in-stent restenosis. Right heart with Fick output/index 5.9/3.1. He underwent DES placement x 2 to LCx and mid - RCA. Had a 24-hour Thallium viability study with minimal to no viability in all segments except for the septum. Discharge weight was 158 pounds.   In 7/19 seen in HF Clinic with recurrent AF and NYHA IIIB symptoms. Underwent DC-CV on 7/25.   Seen by Dr. Prescott Gum 06/14/18 and deemed appropriate candidate for VAD if pt wishes to proceed.He has met a VAD patient and does not want to proceed with VAD implant. He does not like the idea of a big surgery or being attached to something constantly.   Today she returns for HF follow up. Overall feeling fine. Able to do more things around the house. SOB with steps/inclines. Denies PND/Orthopnea. No chest pain. No  bleeding issues. Appetite ok. No fever or chills. Weight at home 168-170  pounds. Taking all medications. Lives alone.    Optivol: Thoracic impedance normal. No VT/A fib. Activity ~1 hour   CPX (04/06/18) FVC 3.58 (79%)    FEV1 2.91 (84%)     FEV1/FVC 81 (105%)    MVV 128 (94%)  Resting HR: 67 Peak HR: 118  (76% age predicted max HR) BP rest: 94/58 BP peak: 102/60 Peak VO2: 14.8 (50% predicted peak VO2) VE/VCO2 slope: 36  Echo 6/19 EF 20% RV ok Personally reviewed  Review of systems complete and found to be negative unless listed in HPI.    Past Medical History:  Diagnosis Date  . AICD (automatic cardioverter/defibrillator) present 2014  . CAD (coronary artery disease)    S/p multiple prior MIs/PCIs // amdx in 2014 with acute stent thrombosis c/b CGS // Admx to [facility] in 08/958 2/2 a/c systolic HF >> [facility] 11/19/38: mLAD 95 ISR, dLAD 30, D2 99; OM2 95 ISR; pRCA 30/50, mRCA 90/80, dRCA 30 EF 15 >> min to no viability on Thallium study (not surgical candidate) - PCI>> 3.5 x 20 mm Promus DES to the LCx 3 x 38 mm Promus DES to the proximal mid RCA  . Chronic systolic heart failure (HCC)    2/2 to ischemic CM - Echo 02/12/17:  Mild LVH, EF 15-20, mod to severe MR, severe LAE, mild RVE, mod reduced RVSF, mild RAE, mod TR, PASP 65  . COPD (chronic obstructive  pulmonary disease) ([place])   . History of MI (myocardial infarction)   . History of pneumonia   . HTN (hypertension)   . Hyperlipidemia 03/08/2017  . Hypothyroidism   . OSA (obstructive sleep apnea) 03/08/2017    Current Outpatient Medications  Medication Sig Dispense Refill  . ALPRAZolam (XANAX) 0.5 MG tablet Take 0.5-1 mg by mouth at bedtime.     Marland Kitchen amiodarone (PACERONE) 200 MG tablet Take 200 mg by mouth daily.    Marland Kitchen apixaban (ELIQUIS) 5 MG TABS tablet Take 1 tablet (5 mg total) by mouth 2 (two) times daily. 60 tablet 6  . ascorbic acid (VITAMIN C) 500 MG tablet Take 500 mg by mouth daily.    Marland Kitchen aspirin EC 81 MG tablet Take 81 mg  by mouth daily.    Marland Kitchen atorvastatin (LIPITOR) 20 MG tablet Take 20 mg by mouth at bedtime. Pt taking about 3 times a week    . carvedilol (COREG) 3.125 MG tablet Take 1 tablet (3.125 mg total) by mouth 2 (two) times daily with a meal. 60 tablet 3  . Cholecalciferol (VITAMIN D3) 2000 units TABS Take 2,000 Units by mouth daily.    . digoxin (LANOXIN) 0.125 MG tablet Take 1 tablet (0.125 mg total) by mouth every other day. 45 tablet 3  . DULoxetine (CYMBALTA) 30 MG capsule Take 90 mg by mouth at bedtime.     . Ferrous Sulfate (IRON) 325 (65 Fe) MG TABS TAKE 1 TABLET BY MOUTH TWICE DAILY WITH A MEAL 60 tablet 3  . furosemide (LASIX) 20 MG tablet Take 20 mg by mouth 2 (two) times daily.    Marland Kitchen levothyroxine (SYNTHROID) 200 MCG tablet Take 1 tablet (200 mcg total) by mouth daily at 6 (six) AM. 30 tablet 1  . Multiple Vitamins-Minerals (MULTIVITAMIN PO) Take 1 tablet by mouth daily.    . pantoprazole (PROTONIX) 40 MG tablet Take 1 tablet (40 mg total) by mouth at bedtime. 30 tablet 6  . potassium chloride (K-DUR) 10 MEQ tablet Take 10 mEq by mouth 2 (two) times daily.    . sacubitril-valsartan (ENTRESTO) 49-51 MG Take 1 tablet by mouth 2 (two) times daily. 60 tablet 6  . spironolactone (ALDACTONE) 25 MG tablet Take 1 tablet (25 mg total) by mouth daily. 30 tablet 3  . vitamin B-12 (CYANOCOBALAMIN) 1000 MCG tablet Take 2,000 mcg by mouth daily.     No current facility-administered medications for this encounter.     Allergies  Allergen Reactions  . Codeine Other (See Comments) and Nausea Only    Increased work of breathing, diaphoretic  . Mirtazapine     Other reaction(s): Other - See Comments  . Prednisone     Anxiety and paranoia      Social History   Socioeconomic History  . Marital status: Widowed    Spouse name: Not on file  . Number of children: Not on file  . Years of education: Not on file  . Highest education level: Not on file  Occupational History  . Not on file  Social Needs   . Financial resource strain: Not on file  . Food insecurity    Worry: Not on file    Inability: Not on file  . Transportation needs    Medical: Not on file    Non-medical: Not on file  Tobacco Use  . Smoking status: Former Smoker    Packs/day: 1.00    Years: 28.00    Pack years: 28.00    Types: Cigarettes  Start date: 46    Quit date: 1996    Years since quitting: 24.6  . Smokeless tobacco: Never Used  Substance and Sexual Activity  . Alcohol use: No  . Drug use: No  . Sexual activity: Not on file  Lifestyle  . Physical activity    Days per week: Not on file    Minutes per session: Not on file  . Stress: Not on file  Relationships  . Social Herbalist on phone: Not on file    Gets together: Not on file    Attends religious service: Not on file    Active member of club or organization: Not on file    Attends meetings of clubs or organizations: Not on file    Relationship status: Not on file  . Intimate partner violence    Fear of current or ex partner: Not on file    Emotionally abused: Not on file    Physically abused: Not on file    Forced sexual activity: Not on file  Other Topics Concern  . Not on file  Social History Narrative   Lives in [place]   Attends [employer]   Disabled carpenter      Family History  Problem Relation Age of Onset  . Diabetes Mother   . Heart disease Mother   . Stroke Mother   . Heart attack Mother   . Heart disease Father   . Heart attack Father   . Stroke Father   . COPD Sister   . Congestive Heart Failure Sister   . Hypertension Sister   . Diabetes Sister   . Hypertension Brother   . Diabetes Sister   . Hypertension Sister   . Diabetes Sister   . Hypertension Sister     Vitals:   03/22/19 1107  BP: 114/68  Pulse: 64  SpO2: 94%  Weight: 77 kg (169 lb 12.8 oz)    Wt Readings from Last 3 Encounters:  03/22/19 77 kg (169 lb 12.8 oz)  02/19/19 82.6 kg (182 lb)  02/07/19 80 kg (176 lb  5.9 oz)     PHYSICAL EXAM: General:  Well appearing. No resp difficulty HEENT: normal Neck: supple. no JVD. Carotids 2+ bilat; no bruits. No lymphadenopathy or thryomegaly appreciated. Cor: PMI nondisplaced. Regular rate & rhythm. No rubs, gallops or murmurs. Lungs: clear Abdomen: soft, nontender, nondistended. No hepatosplenomegaly. No bruits or masses. Good bowel sounds. Extremities: no cyanosis, clubbing, rash, edema Neuro: alert & orientedx3, cranial nerves grossly intact. moves all 4 extremities w/o difficulty. Affect pleasant  EKG: 60 bpm LBBB 194 ms PR 366 ms.   ASSESSMENT & PLAN:  1.Chronic systolic CHF: ICM, EF 57-32% June 2018 s/p MDT ICD - Echo 6/19 reviewed personally. EF 20%. LV markedly dilated and spherical. RV ok  - NYHA IIIB. Volume status stable. Continue current diuretic regimen With lasix 40 mg/25m .  Continue Entresto to 49/51. Watch closely as previously unable to tolerate higher doses due to hypotension. Check BMET today.  - Continue spiro (despite mild gynecomastia - he does not think [facility] will approve Inspra)  - Continue digoxin 0.125 mg  - Continue Coreg 3.125 mg BID.  - Refuses VAD  2. CAD s/p previous MI - Cath 7/1 with severe 3 v CAD with multiple areas of ISR - s/p 2v PCI 02/22/17 by Dr. CBurt Knack -  Anterior wall is non-viable given previous LAD stent thrombosis. 24-hour Thallium viability study showed minimal or no  viability in all segments except for septum - No chest pain.  - Off ASA and Brilinta. Continue Eliqiuis 5 mg BID. Denies bleeding - Continue Crestor.   3. PAF - CHADSVASC = 3 (CHF, HTN, CAD) - s/p  DC-CV 03/09/18. S/P DC-CV 01/2019  - Continue Eliquis 5 mg BID and amio 200 mg daily.   4  Chronic hypoxic respiratory failure with large left pleural effusion - S/p thoracentesis in 6/18 - No recurrence. Did not de-sat when walked around office 05/04/18.   5. COPD - Per PCP and pulmonary. No wheeze on exam.  - PFTs 05/04/18 FEV1 3.03  (87%), FVC 77%, TLC 6.14 (87%), DLCO 14.4 (44%) - Ambulated in clinic and O2 sat remained > 96% for the entirety of walking.   6. Hepatitis C - Hepatitis C Antibody positive during work up labs. He states he underwent therapy with Harvoni in 2015 through the [place].   - No further testing warranted at this time with Harvoni. Pt states he can bring his certificate that says he is "cured" if needed.   7. LBBB QRS 194 ms. ? If he would benefit from device upgrade.  - EP follow up has been set up.   Check BMET    Follow up in 3 months.   Darrick Grinder, NP 03/22/19

## 2019-04-20 ENCOUNTER — Encounter: Payer: Self-pay | Admitting: Internal Medicine

## 2019-04-20 ENCOUNTER — Telehealth (INDEPENDENT_AMBULATORY_CARE_PROVIDER_SITE_OTHER): Payer: Medicare Other | Admitting: Internal Medicine

## 2019-04-20 VITALS — BP 117/82 | HR 88 | Ht 68.0 in | Wt 173.0 lb

## 2019-04-20 DIAGNOSIS — I5022 Chronic systolic (congestive) heart failure: Secondary | ICD-10-CM

## 2019-04-20 DIAGNOSIS — I4819 Other persistent atrial fibrillation: Secondary | ICD-10-CM

## 2019-04-20 DIAGNOSIS — I255 Ischemic cardiomyopathy: Secondary | ICD-10-CM | POA: Diagnosis not present

## 2019-04-20 NOTE — Progress Notes (Signed)
Electrophysiology TeleHealth Note    Due to national recommendations of social distancing due to [place] 19, an audio telehealth visit is felt to be most appropriate for this patient at this time.  Verbal consent was obtained by me for the telehealth visit today.  The patient does not have capability for a virtual visit.  A phone visit is therefore required today.   Date:  04/20/2019   ID:  Stephen Cardenas, DOB 02-03-1956, MRN [id number]  Location: patient's home  Provider location:  [facility]  Evaluation Performed: Follow-up visit  PCP:  [facility]   Electrophysiologist:  Dr Rayann Heman  Chief Complaint:  ICD follow up  History of Present Illness:    Stephen Cardenas is a 63 y.o. male who presents via telehealth conferencing today.  Since last being seen in our clinic, the patient reports doing reasonably well.  Today, he denies symptoms of palpitations, chest pain, shortness of breath,  lower extremity edema, dizziness, presyncope, or syncope.  The patient is otherwise without complaint today.  The patient denies symptoms of fevers, chills, cough, or new SOB worrisome for COVID 19.  Past Medical History:  Diagnosis Date  . AICD (automatic cardioverter/defibrillator) present 2014  . CAD (coronary artery disease)    S/p multiple prior MIs/PCIs // amdx in 2014 with acute stent thrombosis c/b CGS // Admx to [facility] in [id number] 2/2 a/c systolic HF >> [facility] 0000000: mLAD 95 ISR, dLAD 30, D2 99; OM2 95 ISR; pRCA 30/50, mRCA 90/80, dRCA 30 EF 15 >> min to no viability on Thallium study (not surgical candidate) - PCI>> 3.5 x 20 mm Promus DES to the LCx 3 x 38 mm Promus DES to the proximal mid RCA  . Chronic systolic heart failure (HCC)    2/2 to ischemic CM - Echo 02/12/17:  Mild LVH, EF 15-20, mod to severe MR, severe LAE, mild RVE, mod reduced RVSF, mild RAE, mod TR, PASP 65  . COPD (chronic obstructive pulmonary disease) ([place])   . History of MI (myocardial infarction)   . History of  pneumonia   . HTN (hypertension)   . Hyperlipidemia 03/08/2017  . Hypothyroidism   . OSA (obstructive sleep apnea) 03/08/2017    Past Surgical History:  Procedure Laterality Date  . CARDIAC DEFIBRILLATOR PLACEMENT  2014   Medtronic Every XT DR ICD implanted at the [facility] for primary prevention  . CARDIOVERSION N/A 03/09/2018   Procedure: CARDIOVERSION;  Surgeon: Jolaine Artist, MD;  Location: [facility] ENDOSCOPY;  Service: Cardiovascular;  Laterality: N/A;  . CORONARY STENT INTERVENTION N/A 02/22/2017   Procedure: Coronary Stent Intervention;  Surgeon: Sherren Mocha, MD;  Location: [place] CV LAB;  Service: Cardiovascular;  Laterality: N/A;  CFX and RCA  . FACIAL RECONSTRUCTION SURGERY Right 1984  . FEMUR FRACTURE SURGERY Right 1984  . RIGHT/LEFT HEART CATH AND CORONARY ANGIOGRAPHY N/A 02/14/2017   Procedure: Right/Left Heart Cath and Coronary Angiography;  Surgeon: Jolaine Artist, MD;  Location: [place] CV LAB;  Service: Cardiovascular;  Laterality: N/A;  . WRIST FRACTURE SURGERY Right 2015    Current Outpatient Medications  Medication Sig Dispense Refill  . ALPRAZolam (XANAX) 0.5 MG tablet Take 0.5-1 mg by mouth at bedtime.     Marland Kitchen amiodarone (PACERONE) 200 MG tablet Take 200 mg by mouth daily.    Marland Kitchen apixaban (ELIQUIS) 5 MG TABS tablet Take 1 tablet (5 mg total) by mouth 2 (two) times daily. 60 tablet 6  . ascorbic acid (VITAMIN C) 500  MG tablet Take 500 mg by mouth daily.    Marland Kitchen aspirin EC 81 MG tablet Take 81 mg by mouth daily.    Marland Kitchen atorvastatin (LIPITOR) 20 MG tablet Take 20 mg by mouth at bedtime. Pt taking about 3 times a week    . carvedilol (COREG) 3.125 MG tablet Take 1 tablet (3.125 mg total) by mouth 2 (two) times daily with a meal. 60 tablet 3  . Cholecalciferol (VITAMIN D3) 2000 units TABS Take 2,000 Units by mouth daily.    . digoxin (LANOXIN) 0.125 MG tablet Take 1 tablet (0.125 mg total) by mouth every other day. 45 tablet 3  . DULoxetine (CYMBALTA) 30 MG  capsule Take 90 mg by mouth at bedtime.     . Ferrous Sulfate (IRON) 325 (65 Fe) MG TABS TAKE 1 TABLET BY MOUTH TWICE DAILY WITH A MEAL 60 tablet 3  . furosemide (LASIX) 20 MG tablet Take 20 mg by mouth 2 (two) times daily.    Marland Kitchen levothyroxine (SYNTHROID) 200 MCG tablet Take 1 tablet (200 mcg total) by mouth daily at 6 (six) AM. 30 tablet 1  . Multiple Vitamins-Minerals (MULTIVITAMIN PO) Take 1 tablet by mouth daily.    . pantoprazole (PROTONIX) 40 MG tablet Take 1 tablet (40 mg total) by mouth at bedtime. 30 tablet 6  . potassium chloride (K-DUR) 10 MEQ tablet Take 10 mEq by mouth 2 (two) times daily.    . sacubitril-valsartan (ENTRESTO) 49-51 MG Take 1 tablet by mouth 2 (two) times daily. 60 tablet 6  . spironolactone (ALDACTONE) 25 MG tablet Take 1 tablet (25 mg total) by mouth daily. 30 tablet 3  . vitamin B-12 (CYANOCOBALAMIN) 1000 MCG tablet Take 2,000 mcg by mouth daily.     No current facility-administered medications for this visit.     Allergies:   Codeine, Mirtazapine, and Prednisone   Social History:  The patient  reports that he quit smoking about 24 years ago. His smoking use included cigarettes. He started smoking about 52 years ago. He has a 28.00 pack-year smoking history. He has never used smokeless tobacco. He reports that he does not drink alcohol or use drugs.   Family History:  The patient's  family history includes COPD in his sister; Congestive Heart Failure in his sister; Diabetes in his mother, sister, sister, and sister; Heart attack in his father and mother; Heart disease in his father and mother; Hypertension in his brother, sister, sister, and sister; Stroke in his father and mother.   ROS:  Please see the history of present illness.   All other systems are personally reviewed and negative.    Exam:    Vital Signs:  BP 117/82   Pulse 88   Ht 5\' 8"  (1.727 m)   Wt 173 lb (78.5 kg)   BMI 26.30 kg/m   Well sounding and appearing, alert and conversant   Labs/Other Tests and Data Reviewed:    Recent Labs: 12/01/2018: ALT 100; B Natriuretic Peptide >4,500.0; Magnesium 2.2; TSH 38.051 02/07/2019: Hemoglobin 12.1; Platelets 276 03/22/2019: BUN 13; Creatinine, Ser 1.09; Potassium 4.0; Sodium 136   Wt Readings from Last 3 Encounters:  04/20/19 173 lb (78.5 kg)  03/22/19 169 lb 12.8 oz (77 kg)  02/19/19 182 lb (82.6 kg)     Last device remote is reviewed from [place] PDF which reveals normal device function   ASSESSMENT & PLAN:    1.  Chronic systolic heart failure/ICM Stable HF symptoms Followed in AHF clinic and ICM clinic  See recent [facility] report Would reassess 3 months after AF ablation and consider upgrade to CRT  2.  Persistent atrial fibrillation He is currently on amiodarone for rhythm control. His TFT's have been abnormal and he is followed by Dr Renne Crigler.  Discussed treatment options today including ablation  Continue Eliquis for CHADS2VASC of 3 The patient has symptomatic, recurrent persistent atrial fibrillation. he has failed medical therapy with amiodarone. Chads2vasc score is 3.  he is anticoagulated with Eliquis . Therapeutic strategies for afib including medicine and ablation were discussed in detail with the patient today. Risk, benefits, and alternatives to EP study and radiofrequency ablation for afib were also discussed in detail today. These risks include but are not limited to stroke, bleeding, vascular damage, tamponade, perforation, damage to the esophagus, lungs, and other structures, pulmonary vein stenosis, worsening renal function, and death. The patient understands these risk and wishes to proceed.  We will therefore proceed with catheter ablation at the next available time.  Carto, ICE, anesthesia are requested for the procedure.  Will also obtain cardiac CT prior to the procedure to exclude LAA thrombus and further evaluate atrial anatomy.    Follow-up:  [facility]    Patient Risk:  after full review of this  patients clinical status, I feel that they are at moderate risk at this time.  Today, I have spent 15 minutes with the patient with telehealth technology discussing arrhythmia management .    Army Fossa, MD  04/20/2019 11:50 AM     [facility] [address] [place] [address] [phone] (office) [phone] (fax)

## 2019-04-26 ENCOUNTER — Telehealth: Payer: Self-pay

## 2019-04-26 NOTE — Telephone Encounter (Signed)
Outreach made to home and cell phone.  No answer.  Left detailed message requesting call back to schedule AFIB ablation

## 2019-04-26 NOTE — Telephone Encounter (Signed)
-----   Message from Thompson Grayer, MD sent at 04/20/2019 11:55 AM EDT ----- Afib ablation Carto/ice/anesthesia  Cardiac CT

## 2019-04-30 ENCOUNTER — Ambulatory Visit (INDEPENDENT_AMBULATORY_CARE_PROVIDER_SITE_OTHER): Payer: Medicare Other

## 2019-04-30 ENCOUNTER — Telehealth: Payer: Self-pay

## 2019-04-30 DIAGNOSIS — Z95810 Presence of automatic (implantable) cardiac defibrillator: Secondary | ICD-10-CM | POA: Diagnosis not present

## 2019-04-30 DIAGNOSIS — I5022 Chronic systolic (congestive) heart failure: Secondary | ICD-10-CM | POA: Diagnosis not present

## 2019-04-30 NOTE — Telephone Encounter (Signed)
Remote ICM transmission received.  Attempted call to patient regarding ICM remote transmission and left message per DPR to return call.   

## 2019-04-30 NOTE — Telephone Encounter (Signed)
2nd outreach made.  Requested call back to schedule afib ablation.  Advised to call [phone] and ask for Sonia Baller

## 2019-04-30 NOTE — Progress Notes (Signed)
EPIC Encounter for ICM Monitoring  Patient Name: Stephen Cardenas is a 63 y.o. male Date: 04/30/2019 Primary Care Physican: [facility] Primary Cardiologist:Bensimhon Electrophysiologist:Allred 03/21/2019 Weight: 166 lbs 05/01/2019 Weight:  175 lbs   Clinical Status (19-Mar-2019 to 30-Apr-2019) Time in AT/AF    <0.1 hr/day (<0.1%)    Attempted call to patient and unable to reach.  Left message to return call. Transmission reviewed.        OptiVolthoracic impedance suggesting possible ongoing fluid accumulation since 04/09/2019.  Fluid index > threshold starting 04/21/2019.  Prescribed:Furosemide20 mgtake1tablet(20 mg total)twice a day.Potassium 10 mEq take1tablettwice a day.  Labs: 03/22/2019 Creatinine 1.09, BUN 13, Potassium 4.0, Sodium 136, GFR >60 02/07/2019 Creatinine 1.52, BUN 31, Potassium 4.2, Sodium 141, GFR 48-55 12/03/2018 Creatinine 1.25, BUN 27, Potassium 3.8, Sodium 136, GFR >60  12/02/2018 Creatinine 1.24, BUN 24, Potassium 4.0, Sodium 137, GFR >60  12/01/2018 Creatinine 1.27, BUN 18, Potassium 4.0, Sodium 137, GFR 59->60  A complete set of results can be found in Results Review.  Recommendations: Unable to reach.    Follow-up plan: ICM clinic phone appointment on 06/11/2019.   91 day device clinic remote transmission 05/21/2019.  Office appt 06/22/2019 with Dr. Haroldine Laws.    Copy of ICM check sent to Dr. Rayann Heman and Dr Haroldine Laws for review and recommendations if needed.   3 month ICM trend: 04/30/2019    1 Year ICM trend:       [name], RN 04/30/2019 8:13 AM

## 2019-05-01 ENCOUNTER — Ambulatory Visit (INDEPENDENT_AMBULATORY_CARE_PROVIDER_SITE_OTHER): Payer: Medicare Other | Admitting: *Deleted

## 2019-05-01 DIAGNOSIS — I509 Heart failure, unspecified: Secondary | ICD-10-CM

## 2019-05-01 LAB — CUP PACEART REMOTE DEVICE CHECK
Battery Remaining Longevity: 48 mo
Battery Voltage: 2.98 V
Brady Statistic AP VP Percent: 2.22 %
Brady Statistic AP VS Percent: 35.03 %
Brady Statistic AS VP Percent: 3.92 %
Brady Statistic AS VS Percent: 58.83 %
Brady Statistic RA Percent Paced: 37.13 %
Brady Statistic RV Percent Paced: 6.42 %
Date Time Interrogation Session: 20200914073326
HighPow Impedance: 61 Ohm
Implantable Lead Implant Date: 20140813
Implantable Lead Implant Date: 20140813
Implantable Lead Location: 753859
Implantable Lead Location: 753860
Implantable Lead Model: 5076
Implantable Pulse Generator Implant Date: 20140813
Lead Channel Impedance Value: 304 Ohm
Lead Channel Impedance Value: 361 Ohm
Lead Channel Impedance Value: 399 Ohm
Lead Channel Pacing Threshold Amplitude: 0.75 V
Lead Channel Pacing Threshold Amplitude: 1 V
Lead Channel Pacing Threshold Pulse Width: 0.4 ms
Lead Channel Pacing Threshold Pulse Width: 0.4 ms
Lead Channel Sensing Intrinsic Amplitude: 20.625 mV
Lead Channel Sensing Intrinsic Amplitude: 20.625 mV
Lead Channel Sensing Intrinsic Amplitude: 5.375 mV
Lead Channel Sensing Intrinsic Amplitude: 5.375 mV
Lead Channel Setting Pacing Amplitude: 1.5 V
Lead Channel Setting Pacing Amplitude: 2 V
Lead Channel Setting Pacing Pulse Width: 0.4 ms
Lead Channel Setting Sensing Sensitivity: 0.3 mV

## 2019-05-01 NOTE — Progress Notes (Signed)
Spoke with patient.  Transmission reviewed.  He has been on vacation for last week.  Current weight is 175 lbs and he reports baseline is 172 lbs although he was 166 lbs on 03/21/2019 ICM call.  He has not been taking Furosemide as prescribed.  He has only been taking 20 mg daily instead of 20 mg twice a day.  Advised to limit salt intake and to take Furosemide dosage twice a day as prescribed.  He denies other fluid symptoms.  Encouraged to call back if he experience other fluid symptoms or weight continues to increase.

## 2019-05-02 NOTE — Telephone Encounter (Signed)
Attempted to call the pt on his home number and a gentleman answered and told me that I had the wrong number and hung up.. I verified that I dialed the right home number.   LMTCB on the pts cell.

## 2019-05-03 ENCOUNTER — Other Ambulatory Visit: Payer: Self-pay | Admitting: *Deleted

## 2019-05-04 ENCOUNTER — Encounter: Payer: Self-pay | Admitting: *Deleted

## 2019-05-04 NOTE — Telephone Encounter (Signed)
Left message to return call 

## 2019-05-07 NOTE — Telephone Encounter (Signed)
4 outreaches made to Pt with no response.  Letter mailed advising to contact our office.  No further action needed at this time.

## 2019-05-09 NOTE — Progress Notes (Signed)
Remote ICD transmission.   

## 2019-05-11 NOTE — Telephone Encounter (Signed)
Returned your call.

## 2019-05-21 ENCOUNTER — Telehealth: Payer: Self-pay | Admitting: *Deleted

## 2019-05-21 NOTE — Telephone Encounter (Signed)
Lets arrange a televisit on Friday to discuss before changing appt

## 2019-05-21 NOTE — Telephone Encounter (Signed)
Call placed to patient to notify him of scheduled ablation.  Patient states that he has some reservations about doing the procedure. Says that he didn't quite understand why he wanted him to do the ablation since he has been feeling fine / not having any problems the last several months. Stated that he really did not want to do it if he didn't have too. He sees Dr. Lorrin Mais on 06/20/19 and would like to discuss it with him as well.  Patient notified that we will hold off for now & that messages have been to sent to Dr. Rayann Heman & Dr. Lorrin Mais for notification.

## 2019-05-22 NOTE — Telephone Encounter (Signed)
Left message to return call 

## 2019-05-24 NOTE — Telephone Encounter (Signed)
We will see if he is willing to further consider ablation after follow-up with Dr Haroldine Laws.  We have had a lot of trouble contracting this patient.

## 2019-05-28 NOTE — Telephone Encounter (Signed)
Left message to return call 

## 2019-05-29 NOTE — Telephone Encounter (Addendum)
Patient is agreeable to doing virtual visit with Dr. Lorrin Mais.  Will call him back with date & time.

## 2019-05-29 NOTE — Telephone Encounter (Signed)
Stephen Cardenas will be contacting patient for virtual appointment.

## 2019-06-05 ENCOUNTER — Telehealth: Payer: Self-pay

## 2019-06-05 DIAGNOSIS — I5022 Chronic systolic (congestive) heart failure: Secondary | ICD-10-CM

## 2019-06-05 DIAGNOSIS — Z95810 Presence of automatic (implantable) cardiac defibrillator: Secondary | ICD-10-CM

## 2019-06-05 NOTE — Telephone Encounter (Signed)
ICM return call to patient as requested by voice mail message.  Symptoms:   SOB: None at baseline   Weight: gain approximately 4-5 lbs (from 178 lbs to 182 lbs) in last week.     Sleeping: No difficult    Swelling: Pants are tighter in waist but no swelling in extremities.  Other than weight gain he feels fine.   Diet: Food intake has increased and eating more sweets and snacks.  Medication:  Taking Furosemide 20 mg 1 tablet twice a day as prescribed.   Labs: 03/22/2019 Creatinine 1.09, BUN 13, Potassium 4.0, Sodium 136, GFR >60 02/07/2019 Creatinine1.52, BUN31, Potassium4.2, Sodium141, [id number] 12/03/2018 Creatinine1.25, BUN27, Potassium3.8, Sodium136, GFR>60   Upcoming appointments: 06/22/2019 OV with Dr Haroldine Laws  Remote transmission:  He was not at home at the time of the call and will send a remote transmission for review later today

## 2019-06-06 NOTE — Telephone Encounter (Addendum)
Spoke with patient.  He reports weight is 182 lbs.  He denies other fluid symptoms and states he feels fine.  Advised to continue to weigh daily before eating or drinking for consistency and call back if weight continues to rise.  Discussed limiting salt intake and he does note review food labels for salt amount.   Advised to limit salt to 2000 mg daily.   Received [facility] Remote Transmission.  Optivol Thoracic impedance normal.

## 2019-06-11 ENCOUNTER — Ambulatory Visit (INDEPENDENT_AMBULATORY_CARE_PROVIDER_SITE_OTHER): Payer: Medicare Other

## 2019-06-11 DIAGNOSIS — Z95810 Presence of automatic (implantable) cardiac defibrillator: Secondary | ICD-10-CM | POA: Diagnosis not present

## 2019-06-11 DIAGNOSIS — I5022 Chronic systolic (congestive) heart failure: Secondary | ICD-10-CM | POA: Diagnosis not present

## 2019-06-12 ENCOUNTER — Telehealth: Payer: Self-pay

## 2019-06-12 NOTE — Telephone Encounter (Signed)
Remote ICM transmission received.  Attempted call to patient regarding ICM remote transmission and no answer.  

## 2019-06-12 NOTE — Progress Notes (Signed)
EPIC Encounter for ICM Monitoring  Patient Name: Stephen Cardenas is a 63 y.o. male Date: 06/12/2019 Primary Care Physican: [facility] Primary Cardiologist:Bensimhon Electrophysiologist:Allred 05/01/2019 Weight: 175 lbs 06/05/2019 Weight: 182 lbs       Attempted call to patient and unable to reach.   Transmission reviewed.    Optivol thoracic impedance normal.   Prescribed:Furosemide20 mgtake1tablet(20 mg total)twice a day.Potassium 10 mEq take1tablettwice a day.  Labs: 03/22/2019 Creatinine 1.09, BUN 13, Potassium 4.0, Sodium 136, GFR >60 02/07/2019 Creatinine1.52, BUN31, Potassium4.2, Sodium141, GFR48-55 12/03/2018 Creatinine1.25, BUN27, Potassium3.8, Sodium136, GFR>60  12/02/2018 Creatinine1.24, BUN24, Potassium4.0, Sodium137, GFR>60  12/01/2018 Creatinine1.27, BUN18, Potassium4.0, [place]:[id number], GFR59->60 A complete set of results can be found in Results Review.  Recommendations: Unable to reach.    Follow-up plan: ICM clinic phone appointment on 07/16/2019.   91 day device clinic remote transmission 07/31/2019.  Office appt 06/22/2019 with Dr. Haroldine Laws.   Copy of ICM check sent to Dr. Rayann Heman.   3 month ICM trend: 06/11/2019    1 Year ICM trend:       [name], RN 06/12/2019 10:58 AM

## 2019-06-22 ENCOUNTER — Ambulatory Visit (HOSPITAL_COMMUNITY)
Admission: RE | Admit: 2019-06-22 | Discharge: 2019-06-22 | Disposition: A | Discharge status: Home / Self Care | Payer: Medicare Other | Source: Ambulatory Visit | Attending: Internal Medicine | Admitting: Internal Medicine

## 2019-06-22 ENCOUNTER — Encounter (HOSPITAL_COMMUNITY): Payer: Self-pay | Admitting: Internal Medicine

## 2019-06-22 ENCOUNTER — Other Ambulatory Visit: Payer: Self-pay

## 2019-06-22 VITALS — BP 102/70 | HR 85 | Wt 191.0 lb

## 2019-06-22 DIAGNOSIS — J90 Pleural effusion, not elsewhere classified: Secondary | ICD-10-CM | POA: Diagnosis not present

## 2019-06-22 DIAGNOSIS — B1920 Unspecified viral hepatitis C without hepatic coma: Secondary | ICD-10-CM | POA: Diagnosis not present

## 2019-06-22 DIAGNOSIS — Z833 Family history of diabetes mellitus: Secondary | ICD-10-CM | POA: Insufficient documentation

## 2019-06-22 DIAGNOSIS — E785 Hyperlipidemia, unspecified: Secondary | ICD-10-CM | POA: Diagnosis not present

## 2019-06-22 DIAGNOSIS — Z955 Presence of coronary angioplasty implant and graft: Secondary | ICD-10-CM | POA: Diagnosis not present

## 2019-06-22 DIAGNOSIS — Z95810 Presence of automatic (implantable) cardiac defibrillator: Secondary | ICD-10-CM | POA: Insufficient documentation

## 2019-06-22 DIAGNOSIS — I480 Paroxysmal atrial fibrillation: Secondary | ICD-10-CM | POA: Insufficient documentation

## 2019-06-22 DIAGNOSIS — Z87891 Personal history of nicotine dependence: Secondary | ICD-10-CM | POA: Insufficient documentation

## 2019-06-22 DIAGNOSIS — I255 Ischemic cardiomyopathy: Secondary | ICD-10-CM | POA: Diagnosis not present

## 2019-06-22 DIAGNOSIS — Z882 Allergy status to sulfonamides status: Secondary | ICD-10-CM | POA: Diagnosis not present

## 2019-06-22 DIAGNOSIS — Z888 Allergy status to other drugs, medicaments and biological substances status: Secondary | ICD-10-CM | POA: Insufficient documentation

## 2019-06-22 DIAGNOSIS — G4733 Obstructive sleep apnea (adult) (pediatric): Secondary | ICD-10-CM | POA: Diagnosis not present

## 2019-06-22 DIAGNOSIS — Z823 Family history of stroke: Secondary | ICD-10-CM | POA: Insufficient documentation

## 2019-06-22 DIAGNOSIS — I5022 Chronic systolic (congestive) heart failure: Secondary | ICD-10-CM | POA: Insufficient documentation

## 2019-06-22 DIAGNOSIS — I110 Hypertensive heart disease with heart failure: Secondary | ICD-10-CM | POA: Insufficient documentation

## 2019-06-22 DIAGNOSIS — Z885 Allergy status to narcotic agent status: Secondary | ICD-10-CM | POA: Diagnosis not present

## 2019-06-22 DIAGNOSIS — Z7901 Long term (current) use of anticoagulants: Secondary | ICD-10-CM | POA: Diagnosis not present

## 2019-06-22 DIAGNOSIS — Z7982 Long term (current) use of aspirin: Secondary | ICD-10-CM | POA: Diagnosis not present

## 2019-06-22 DIAGNOSIS — J9611 Chronic respiratory failure with hypoxia: Secondary | ICD-10-CM | POA: Insufficient documentation

## 2019-06-22 DIAGNOSIS — E039 Hypothyroidism, unspecified: Secondary | ICD-10-CM | POA: Insufficient documentation

## 2019-06-22 DIAGNOSIS — I447 Left bundle-branch block, unspecified: Secondary | ICD-10-CM | POA: Insufficient documentation

## 2019-06-22 DIAGNOSIS — I252 Old myocardial infarction: Secondary | ICD-10-CM | POA: Diagnosis not present

## 2019-06-22 DIAGNOSIS — I2510 Atherosclerotic heart disease of native coronary artery without angina pectoris: Secondary | ICD-10-CM | POA: Diagnosis not present

## 2019-06-22 DIAGNOSIS — N62 Hypertrophy of breast: Secondary | ICD-10-CM | POA: Insufficient documentation

## 2019-06-22 DIAGNOSIS — Z79899 Other long term (current) drug therapy: Secondary | ICD-10-CM | POA: Diagnosis not present

## 2019-06-22 DIAGNOSIS — Z8249 Family history of ischemic heart disease and other diseases of the circulatory system: Secondary | ICD-10-CM | POA: Insufficient documentation

## 2019-06-22 DIAGNOSIS — J449 Chronic obstructive pulmonary disease, unspecified: Secondary | ICD-10-CM | POA: Diagnosis not present

## 2019-06-22 LAB — COMPREHENSIVE METABOLIC PANEL
ALT: 15 U/L (ref 0–44)
AST: 25 U/L (ref 15–41)
Albumin: 3.9 g/dL (ref 3.5–5.0)
Alkaline Phosphatase: 55 U/L (ref 38–126)
Anion gap: 12 (ref 5–15)
BUN: 13 mg/dL (ref 8–23)
CO2: 27 mmol/L (ref 22–32)
Calcium: 9.4 mg/dL (ref 8.9–10.3)
Chloride: 100 mmol/L (ref 98–111)
Creatinine, Ser: 1.33 mg/dL — ABNORMAL HIGH (ref 0.61–1.24)
GFR calc Af Amer: >60 mL/min (ref >60)
GFR calc non Af Amer: 56 mL/min — ABNORMAL LOW (ref >60)
Glucose, Bld: 118 mg/dL — ABNORMAL HIGH (ref 70–99)
Potassium: 3.9 mmol/L (ref 3.5–5.1)
Sodium: 139 mmol/L (ref 135–145)
Total Bilirubin: 0.9 mg/dL (ref 0.3–1.2)
Total Protein: 7.4 g/dL (ref 6.5–8.1)

## 2019-06-22 LAB — CBC
HCT: 39.9 % (ref 39.0–52.0)
Hemoglobin: 13.6 g/dL (ref 13.0–17.0)
MCH: 33.3 pg (ref 26.0–34.0)
MCHC: 34.1 g/dL (ref 30.0–36.0)
MCV: 97.6 fL (ref 80.0–100.0)
Platelets: 324 10*3/uL (ref 150–400)
RBC: 4.09 MIL/uL — ABNORMAL LOW (ref 4.22–5.81)
RDW: 13.4 % (ref 11.5–15.5)
WBC: 6.6 10*3/uL (ref 4.0–10.5)
nRBC: 0 % (ref 0.0–0.2)

## 2019-06-22 LAB — BRAIN NATRIURETIC PEPTIDE: B Natriuretic Peptide: 971.8 pg/mL — ABNORMAL HIGH (ref 0.0–100.0)

## 2019-06-22 LAB — DIGOXIN LEVEL: Digoxin Level: 0.8 ng/mL (ref 0.8–2.0)

## 2019-06-22 NOTE — Patient Instructions (Signed)
Labs done today, we will notify you for abnormal readings  We have let Dr Jackalyn Lombard office know you are ready to proceed with the ablation, they will contact you about scheduling.  Your physician recommends that you schedule a follow-up appointment in: 3-4 months  If you have any questions or concerns before your next appointment please send [place] a message through [place] or call our office at [phone].  At the [facility], you and your health needs are our priority. As part of our continuing mission to provide you with exceptional heart care, we have created designated Provider Care Teams. These Care Teams include your primary Cardiologist (physician) and Advanced Practice Providers (APPs- Physician Assistants and Nurse Practitioners) who all work together to provide you with the care you need, when you need it.   You may see any of the following providers on your designated Care Team at your next follow up: Marland Kitchen Dr Glori Bickers . Dr Loralie Champagne . Darrick Grinder, NP . Lyda Jester, PA   Please be sure to bring in all your medications bottles to every appointment.

## 2019-06-22 NOTE — Progress Notes (Addendum)
Advanced Heart Failure Clinic Note   Referring Physician: Dr. Orpah Greek, [facility] Primary Care: Dr. Clementeen Graham Primary Cardiologist: Dr. Haroldine Laws.   HPI: Stephen Cardenas is a 63 y.o. male with a past medical history of chronic systolic CHF (EF 22%) due to ischemic cardiomyopathy with a history of MI. AICD, COPD, OSA on CPAP, HTN, PAF, and hypothyroidism.   He has a history of multiple MI's. One in 2007 and one in 2014 that resulted in cardiogenic shock and a prolonged ICU hospitalization.   He was admitted to [facility] in June 2018 with hypoglycemia and discharged to Rehab. He was discharged from [place] but quickly readmitted to [facility] with chest pain and SOB. He was told that he had a fluid on his lung and then transferred back to rehab and eventually discharged on 02/07/17. He presented to the ED at [facility] on 02/09/17 with SOB and weakness. Chest X ray with large pleural effusion, he was therefore transferred to [facility] where he underwent a left thoracentesis with transudative fluid.   He underwent repeat right and left heart cath on 02/14/17 that showed severe 3 vessel disease with multiple areas of in-stent restenosis. Right heart with Fick output/index 5.9/3.1. He underwent DES placement x 2 to LCx and mid - RCA. Had a 24-hour Thallium viability study with minimal to no viability in all segments except for the septum. Discharge weight was 158 pounds.   In 7/19 seen in HF Clinic with recurrent AF and NYHA IIIB symptoms. Underwent DC-CV on 03/09/18.   Seen by Dr. Prescott Cardenas 06/14/18 and deemed appropriate candidate for VAD if pt wishes to proceed.He has met a VAD patient and does not want to proceed with VAD implant. He does not like the idea of a big surgery or being attached to something constantly.   Had recurrent AF in 6/20 and underwent DC-CV on 02/07/19  Subsequently seen in AF Clinic and by Dr. Rayann Heman and planned for AF ablation but Stephen Cardenas wanted to talk to me  first.   Today he returns for HF follow up. Says he is feeling ok. Main concern is weight gain. Had his Optivol looked at last week and fluid was normal. Went to [place] and they rechecked his TSH and it was 17 (down from previous) and they will follow at [facility]. (previously followed by Dr. Renne Crigler). Not very active due to COVID. Eating a lot. No CP, orthopnea, edema or PND.   Overall feeling fine. Able to do more things around the house. SOB with steps/inclines. Denies PND/Orthopnea. No chest pain. No bleeding issues. Appetite ok. No fever or chills. Weight at home 168-170  pounds. Taking all medications. Lives alone.  No palpitations  Optivol: Thoracic impedance normal. No VT/AF. Activity level low. Personally reviewed   CPX (04/06/18) FVC 3.58 (79%)    FEV1 2.91 (84%)     FEV1/FVC 81 (105%)    MVV 128 (94%)  Resting HR: 67 Peak HR: 118  (76% age predicted max HR) BP rest: 94/58 BP peak: 102/60 Peak VO2: 14.8 (50% predicted peak VO2) VE/VCO2 slope: 36  Echo 6/19 EF 20% RV ok Personally reviewed  Review of systems complete and found to be negative unless listed in HPI.    Past Medical History:  Diagnosis Date  . AICD (automatic cardioverter/defibrillator) present 2014  . CAD (coronary artery disease)    S/p multiple prior MIs/PCIs // amdx in 2014 with acute stent thrombosis c/b CGS // Admx to [facility] in 02/2017 2/2 a/c  systolic HF >> [facility] 09/20/07: mLAD 95 ISR, dLAD 30, D2 99; OM2 95 ISR; pRCA 30/50, mRCA 90/80, dRCA 30 EF 15 >> min to no viability on Thallium study (not surgical candidate) - PCI>> 3.5 x 20 mm Promus DES to the LCx 3 x 38 mm Promus DES to the proximal mid RCA  . Chronic systolic heart failure (HCC)    2/2 to ischemic CM - Echo 02/12/17:  Mild LVH, EF 15-20, mod to severe MR, severe LAE, mild RVE, mod reduced RVSF, mild RAE, mod TR, PASP 65  . COPD (chronic obstructive pulmonary disease) ([place])   . History of MI (myocardial infarction)   . History of pneumonia   . HTN  (hypertension)   . Hyperlipidemia 03/08/2017  . Hypothyroidism   . OSA (obstructive sleep apnea) 03/08/2017    Current Outpatient Medications  Medication Sig Dispense Refill  . ALPRAZolam (XANAX) 0.5 MG tablet Take 0.5-1 mg by mouth at bedtime.     Marland Kitchen amiodarone (PACERONE) 200 MG tablet Take 200 mg by mouth daily.    Marland Kitchen apixaban (ELIQUIS) 5 MG TABS tablet Take 1 tablet (5 mg total) by mouth 2 (two) times daily. 60 tablet 6  . ascorbic acid (VITAMIN C) 500 MG tablet Take 500 mg by mouth daily.    Marland Kitchen aspirin EC 81 MG tablet Take 81 mg by mouth daily.    Marland Kitchen atorvastatin (LIPITOR) 20 MG tablet Take 20 mg by mouth at bedtime. Pt taking about 3 times a week    . carvedilol (COREG) 3.125 MG tablet Take 1 tablet (3.125 mg total) by mouth 2 (two) times daily with a meal. 60 tablet 3  . Cholecalciferol (VITAMIN D3) 2000 units TABS Take 2,000 Units by mouth daily.    . digoxin (LANOXIN) 0.125 MG tablet Take 1 tablet (0.125 mg total) by mouth every other day. 45 tablet 3  . DULoxetine (CYMBALTA) 30 MG capsule Take 90 mg by mouth at bedtime.     . Ferrous Sulfate (IRON) 325 (65 Fe) MG TABS TAKE 1 TABLET BY MOUTH TWICE DAILY WITH A MEAL 60 tablet 3  . furosemide (LASIX) 20 MG tablet Take 20 mg by mouth 2 (two) times daily.    Marland Kitchen levothyroxine (SYNTHROID) 200 MCG tablet Take 1 tablet (200 mcg total) by mouth daily at 6 (six) AM. 30 tablet 1  . Multiple Vitamins-Minerals (MULTIVITAMIN PO) Take 1 tablet by mouth daily.    . pantoprazole (PROTONIX) 40 MG tablet Take 1 tablet (40 mg total) by mouth at bedtime. 30 tablet 6  . potassium chloride (K-DUR) 10 MEQ tablet Take 10 mEq by mouth 2 (two) times daily.    . sacubitril-valsartan (ENTRESTO) 49-51 MG Take 1 tablet by mouth 2 (two) times daily. 60 tablet 6  . spironolactone (ALDACTONE) 25 MG tablet Take 1 tablet (25 mg total) by mouth daily. 30 tablet 3  . vitamin B-12 (CYANOCOBALAMIN) 1000 MCG tablet Take 2,000 mcg by mouth daily.     No current  facility-administered medications for this encounter.     Allergies  Allergen Reactions  . Codeine Other (See Comments) and Nausea Only    Increased work of breathing, diaphoretic  . Mirtazapine     Other reaction(s): Other - See Comments  . Prednisone     Anxiety and paranoia      Social History   Socioeconomic History  . Marital status: Widowed    Spouse name: Not on file  . Number of children: Not on file  .  Years of education: Not on file  . Highest education level: Not on file  Occupational History  . Not on file  Social Needs  . Financial resource strain: Not on file  . Food insecurity    Worry: Not on file    Inability: Not on file  . Transportation needs    Medical: Not on file    Non-medical: Not on file  Tobacco Use  . Smoking status: Former Smoker    Packs/day: 1.00    Years: 28.00    Pack years: 28.00    Types: Cigarettes    Start date: 1968    Quit date: 1996    Years since quitting: 24.8  . Smokeless tobacco: Never Used  Substance and Sexual Activity  . Alcohol use: No  . Drug use: No  . Sexual activity: Not on file  Lifestyle  . Physical activity    Days per week: Not on file    Minutes per session: Not on file  . Stress: Not on file  Relationships  . Social Herbalist on phone: Not on file    Gets together: Not on file    Attends religious service: Not on file    Active member of club or organization: Not on file    Attends meetings of clubs or organizations: Not on file    Relationship status: Not on file  . Intimate partner violence    Fear of current or ex partner: Not on file    Emotionally abused: Not on file    Physically abused: Not on file    Forced sexual activity: Not on file  Other Topics Concern  . Not on file  Social History Narrative   Lives in [place]   Attends [employer]   Disabled carpenter      Family History  Problem Relation Age of Onset  . Diabetes Mother   . Heart disease Mother    . Stroke Mother   . Heart attack Mother   . Heart disease Father   . Heart attack Father   . Stroke Father   . COPD Sister   . Congestive Heart Failure Sister   . Hypertension Sister   . Diabetes Sister   . Hypertension Brother   . Diabetes Sister   . Hypertension Sister   . Diabetes Sister   . Hypertension Sister     Vitals:   06/22/19 1036  BP: 102/70  Pulse: 85  SpO2: 98%  Weight: 86.6 kg (191 lb)    Wt Readings from Last 3 Encounters:  06/22/19 86.6 kg (191 lb)  04/20/19 78.5 kg (173 lb)  03/22/19 77 kg (169 lb 12.8 oz)     PHYSICAL EXAM: General:  Well appearing. No resp difficulty HEENT: normal Neck: supple. no JVD. Carotids 2+ bilat; no bruits. No lymphadenopathy or thryomegaly appreciated. Cor: PMI nondisplaced. Regular rate & rhythm. No rubs, gallops or murmurs. Lungs: clear with mildly decreased BS throughout  Abdomen: obese soft, nontender, nondistended. No hepatosplenomegaly. No bruits or masses. Good bowel sounds. Extremities: no cyanosis, clubbing, rash, edema Neuro: alert & orientedx3, cranial nerves grossly intact. moves all 4 extremities w/o difficulty. Affect pleasant   ASSESSMENT & PLAN:  1.Chronic systolic CHF: ICM, EF 76-28% June 2018 s/p MDT ICD - Echo 6/19 reviewed personally. EF 20%. LV markedly dilated and spherical. RV ok  - Stable NYHA III. Volume status stable on exam and IC interrogation. - Continue current diuretic regimen With lasix  40 mg/66m .  - Continue Entresto to 49/51. BP too soft to titrate - Continue spiro (despite mild gynecomastia - he does not think [facility] will approve Inspra)  - Continue digoxin 0.125 mg  - Continue Coreg 3.125 mg BID.  - Refuses VAD - Labs today including digoxin level - Consider Farxiga down the road as BP tolerates    2. CAD s/p previous MI - Cath 7/1 with severe 3 v CAD with multiple areas of ISR - s/p 2v PCI 02/22/17 by Dr. CBurt Knack -  Anterior wall is non-viable given previous LAD stent  thrombosis. 24-hour Thallium viability study showed minimal or no viability in all segments except for septum - No s/s of sichemia - Off ASA and Brilinta. Continue Eliqiuis 5 mg BID. Denies bleeding - Continue Crestor.   3. PAF - In NSR today - CHADSVASC = 3 (CHF, HTN, CAD) - s/p  DC-CV 03/09/18. S/P DC-CV 01/2019  - Continue Eliquis 5 mg BID and amio 200 mg daily.  - We discussed AF ablation at length and he wants to proceed. Will alert Dr. ARayann Heman  4  Chronic hypoxic respiratory failure with large left pleural effusion - S/p thoracentesis in 6/18 - No recurrence. Did not de-sat when walked around office 05/04/18.   5. COPD - Per PCP and pulmonary. No wheeze on exam.  - PFTs 05/04/18 FEV1 3.03 (87%), FVC 77%, TLC 6.14 (87%), DLCO 14.4 (44%) - Ambulated in clinic and O2 sat remained > 96% for the entirety of walking.   6. Hepatitis C - Hepatitis C Antibody positive during work up labs. He states he [place] [facility] [place]   - No further testing warranted at this time with Harvoni. Pt states he can bring his certificate that says he is "cured" if needed.   7. LBBB - QRS 194 ms. ? If he would benefit from device upgrade.  - Dr. ARayann Hemanhas seen and planned to assess for CRT upgrade after AF ablation    8. Hypothyroidism  - long-standing. Previously followed by Dr. GRenne Crigler- Now being followed by the [facility]- Synthroid adjusted recently   DGlori Bickers MD 06/22/19

## 2019-06-26 ENCOUNTER — Telehealth: Payer: Self-pay

## 2019-06-26 DIAGNOSIS — I480 Paroxysmal atrial fibrillation: Secondary | ICD-10-CM

## 2019-07-02 NOTE — Telephone Encounter (Signed)
Pt scheduled for afib ablation on July 24, 2019 at 7:30 am  Will mail letters

## 2019-07-02 NOTE — Telephone Encounter (Signed)
Called to check on the status of his ablation appointment. Advised that message would be sent to staff that handles this procedure. Verbalized understanding.

## 2019-07-03 ENCOUNTER — Other Ambulatory Visit: Payer: Medicare Other

## 2019-07-03 NOTE — Telephone Encounter (Signed)
Work up complete  Letters mailed 07/03/2019  Will call Pt next week to ensure he received letters and to go over instructions

## 2019-07-06 ENCOUNTER — Telehealth: Payer: Self-pay

## 2019-07-06 NOTE — Telephone Encounter (Signed)
Returned call to patient as requested.  He attempted to send remote transmission this morning due to he has gained weight in the last month.  The transmission was not successful and would like monitor assistance.  Provided [employer] tech support number and advised to call today.  His weight today is 190 lbs and on was 182 lbs at last ICM call on10/20.  He discussed weight gain with Dr Haroldine Laws at 11/6 office visit.  He will call back when he is able to send remote transmission for review.

## 2019-07-16 ENCOUNTER — Ambulatory Visit (INDEPENDENT_AMBULATORY_CARE_PROVIDER_SITE_OTHER): Payer: Medicare Other

## 2019-07-16 DIAGNOSIS — I5022 Chronic systolic (congestive) heart failure: Secondary | ICD-10-CM

## 2019-07-16 DIAGNOSIS — Z95810 Presence of automatic (implantable) cardiac defibrillator: Secondary | ICD-10-CM

## 2019-07-16 NOTE — Progress Notes (Signed)
EPIC Encounter for ICM Monitoring  Patient Name: Stephen Cardenas is a 63 y.o. male Date: 07/16/2019 Primary Care Physican: [facility] Primary Cardiologist:Bensimhon Electrophysiologist:Allred 05/01/2019 Weight: 175 lbs 06/05/2019 Weight: 182 lbs 07/16/2019 Weight: 186 lbs                                                           Spoke with patient.  He is doing well and voiced no complaints today.    Optivol thoracic impedance normal.   Prescribed:Furosemide20 mgtake1tablet(20 mg total)twice a day.Potassium10 mEq take1tablettwice a day.  Labs: 03/22/2019 Creatinine 1.09, BUN 13, Potassium 4.0, Sodium 136, GFR >60 02/07/2019 Creatinine1.52, BUN31, Potassium4.2, Sodium141, GFR48-55 12/03/2018 Creatinine1.25, BUN27, Potassium3.8, Sodium136, GFR>60  12/02/2018 Creatinine1.24, BUN24, Potassium4.0, Sodium137, GFR>60  12/01/2018 Creatinine1.27, BUN18, Potassium4.0, [id number], GFR59->60 A complete set of results can be found in Results Review.  Recommendations:  Encouraged to call if experiencing fluid symptoms.  Follow-up plan: ICM clinic phone appointment on 08/20/2019.   91 day device clinic remote transmission 07/31/2019.    Copy of ICM check sent to Dr. Rayann Heman.   3 month ICM trend: 07/16/2019    1 Year ICM trend:       [name], RN 07/16/2019 1:35 PM

## 2019-07-19 ENCOUNTER — Telehealth: Payer: Self-pay

## 2019-07-19 ENCOUNTER — Telehealth (HOSPITAL_COMMUNITY): Payer: Self-pay | Admitting: Emergency Medicine

## 2019-07-19 NOTE — Telephone Encounter (Signed)
Left message on voicemail with name and callback number Cassian Torelli RN Navigator Cardiac Imaging [facility] [phone] Office [phone] Cell  

## 2019-07-19 NOTE — Telephone Encounter (Signed)
LMTCB

## 2019-07-20 ENCOUNTER — Other Ambulatory Visit: Payer: Self-pay

## 2019-07-20 ENCOUNTER — Other Ambulatory Visit: Payer: Medicare Other | Admitting: *Deleted

## 2019-07-20 ENCOUNTER — Other Ambulatory Visit (HOSPITAL_COMMUNITY)
Admission: RE | Admit: 2019-07-20 | Discharge: 2019-07-20 | Disposition: A | Discharge status: Home / Self Care | Payer: Medicare Other | Source: Ambulatory Visit | Attending: Internal Medicine | Admitting: Internal Medicine

## 2019-07-20 ENCOUNTER — Ambulatory Visit (HOSPITAL_COMMUNITY)
Admission: RE | Admit: 2019-07-20 | Discharge: 2019-07-20 | Disposition: A | Discharge status: Home / Self Care | Payer: Medicare Other | Source: Ambulatory Visit | Attending: Internal Medicine | Admitting: Internal Medicine

## 2019-07-20 DIAGNOSIS — I480 Paroxysmal atrial fibrillation: Secondary | ICD-10-CM | POA: Diagnosis not present

## 2019-07-20 DIAGNOSIS — R591 Generalized enlarged lymph nodes: Secondary | ICD-10-CM | POA: Diagnosis not present

## 2019-07-20 DIAGNOSIS — Z01812 Encounter for preprocedural laboratory examination: Secondary | ICD-10-CM | POA: Diagnosis not present

## 2019-07-20 DIAGNOSIS — I517 Cardiomegaly: Secondary | ICD-10-CM | POA: Diagnosis not present

## 2019-07-20 DIAGNOSIS — I2510 Atherosclerotic heart disease of native coronary artery without angina pectoris: Secondary | ICD-10-CM | POA: Diagnosis not present

## 2019-07-20 DIAGNOSIS — Z20828 Contact with and (suspected) exposure to other viral communicable diseases: Secondary | ICD-10-CM | POA: Insufficient documentation

## 2019-07-20 MED ORDER — IOHEXOL 350 MG/ML SOLN
80.0000 mL | Freq: Once | INTRAVENOUS | Status: AC | PRN
  Administered 2019-07-20: 80 mL via INTRAVENOUS

## 2019-07-21 LAB — CBC WITH DIFFERENTIAL/PLATELET
Basophils Absolute: 0.1 10*3/uL (ref 0.0–0.2)
Basos: 1 %
EOS (ABSOLUTE): 0.1 10*3/uL (ref 0.0–0.4)
Eos: 2 %
Hematocrit: 38.2 % (ref 37.5–51.0)
Hemoglobin: 13.1 g/dL (ref 13.0–17.7)
Immature Grans (Abs): 0 10*3/uL (ref 0.0–0.1)
Immature Granulocytes: 0 %
Lymphocytes Absolute: 1.7 10*3/uL (ref 0.7–3.1)
Lymphs: 22 %
MCH: 33.2 pg — ABNORMAL HIGH (ref 26.6–33.0)
MCHC: 34.3 g/dL (ref 31.5–35.7)
MCV: 97 fL (ref 79–97)
Monocytes Absolute: 0.8 10*3/uL (ref 0.1–0.9)
Monocytes: 10 %
Neutrophils Absolute: 4.9 10*3/uL (ref 1.4–7.0)
Neutrophils: 65 %
Platelets: 266 10*3/uL (ref 150–450)
RBC: 3.94 x10E6/uL — ABNORMAL LOW (ref 4.14–5.80)
RDW: 13 % (ref 11.6–15.4)
WBC: 7.6 10*3/uL (ref 3.4–10.8)

## 2019-07-21 LAB — BASIC METABOLIC PANEL
BUN/Creatinine Ratio: 16 (ref 10–24)
BUN: 20 mg/dL (ref 8–27)
CO2: 25 mmol/L (ref 20–29)
Calcium: 9.2 mg/dL (ref 8.6–10.2)
Chloride: 99 mmol/L (ref 96–106)
Creatinine, Ser: 1.28 mg/dL — ABNORMAL HIGH (ref 0.76–1.27)
GFR calc Af Amer: 67 mL/min/{1.73_m2} (ref >59)
GFR calc non Af Amer: 58 mL/min/{1.73_m2} — ABNORMAL LOW (ref >59)
Glucose: 102 mg/dL — ABNORMAL HIGH (ref 65–99)
Potassium: 4.6 mmol/L (ref 3.5–5.2)
Sodium: 138 mmol/L (ref 134–144)

## 2019-07-21 LAB — NOVEL CORONAVIRUS, NAA (HOSP ORDER, SEND-OUT TO REF LAB; TAT 18-24 HRS): SARS-CoV-2, NAA: NOT DETECTED

## 2019-07-23 NOTE — Anesthesia Preprocedure Evaluation (Addendum)
Anesthesia Evaluation  Patient identified by MRN, date of birth, ID band Patient awake    Reviewed: Allergy & Precautions, NPO status , Patient's Chart, lab work & pertinent test results, reviewed documented beta blocker date and time   History of Anesthesia Complications Negative for: history of anesthetic complications  Airway Mallampati: II  TM Distance: >3 FB Neck ROM: Full    Dental  (+) Edentulous Upper, Edentulous Lower   Pulmonary sleep apnea , COPD, former smoker,    Pulmonary exam normal        Cardiovascular hypertension, Pt. on medications and Pt. on home beta blockers + CAD, + Cardiac Stents and +CHF  Normal cardiovascular exam+ dysrhythmias Atrial Fibrillation + Cardiac Defibrillator   TTE 01/2018: EF 15-20%, diffuse hypokinesis, mild MR, pacer    Neuro/Psych Anxiety Depression negative neurological ROS     GI/Hepatic Neg liver ROS, GERD  Medicated and Controlled,  Endo/Other  Hypothyroidism   Renal/GU Renal InsufficiencyRenal disease  negative genitourinary   Musculoskeletal negative musculoskeletal ROS (+)   Abdominal   Peds  Hematology negative hematology ROS (+)   Anesthesia Other Findings Day of surgery medications reviewed with patient.  Reproductive/Obstetrics negative OB ROS                           Anesthesia Physical Anesthesia Plan  ASA: IV  Anesthesia Plan: General   Post-op Pain Management:    Induction: Intravenous  PONV Risk Score and Plan: 2 and Treatment may vary due to age or medical condition, Ondansetron and Midazolam  Airway Management Planned: Oral ETT  Additional Equipment: Arterial line  Intra-op Plan:   Post-operative Plan: Extubation in OR  Informed Consent: I have reviewed the patients History and Physical, chart, labs and discussed the procedure including the risks, benefits and alternatives for the proposed anesthesia with the  patient or authorized representative who has indicated his/her understanding and acceptance.     Dental advisory given  Plan Discussed with: CRNA  Anesthesia Plan Comments:        Anesthesia Quick Evaluation

## 2019-07-24 ENCOUNTER — Ambulatory Visit (HOSPITAL_COMMUNITY)
Admission: RE | Admit: 2019-07-24 | Discharge: 2019-07-25 | Disposition: A | Discharge status: Home / Self Care | Payer: Medicare Other | Attending: Internal Medicine | Admitting: Internal Medicine

## 2019-07-24 ENCOUNTER — Other Ambulatory Visit: Payer: Self-pay

## 2019-07-24 ENCOUNTER — Encounter (HOSPITAL_COMMUNITY)
Admission: RE | Disposition: A | Discharge status: Home / Self Care | Payer: Self-pay | Source: Home / Self Care | Attending: Internal Medicine

## 2019-07-24 ENCOUNTER — Ambulatory Visit (HOSPITAL_COMMUNITY): Payer: Medicare Other | Admitting: Anesthesiology

## 2019-07-24 DIAGNOSIS — I5022 Chronic systolic (congestive) heart failure: Secondary | ICD-10-CM | POA: Insufficient documentation

## 2019-07-24 DIAGNOSIS — I452 Bifascicular block: Secondary | ICD-10-CM | POA: Insufficient documentation

## 2019-07-24 DIAGNOSIS — Z885 Allergy status to narcotic agent status: Secondary | ICD-10-CM | POA: Diagnosis not present

## 2019-07-24 DIAGNOSIS — Z7901 Long term (current) use of anticoagulants: Secondary | ICD-10-CM | POA: Diagnosis not present

## 2019-07-24 DIAGNOSIS — J449 Chronic obstructive pulmonary disease, unspecified: Secondary | ICD-10-CM | POA: Insufficient documentation

## 2019-07-24 DIAGNOSIS — E039 Hypothyroidism, unspecified: Secondary | ICD-10-CM | POA: Diagnosis not present

## 2019-07-24 DIAGNOSIS — I252 Old myocardial infarction: Secondary | ICD-10-CM | POA: Diagnosis not present

## 2019-07-24 DIAGNOSIS — Z95810 Presence of automatic (implantable) cardiac defibrillator: Secondary | ICD-10-CM | POA: Diagnosis not present

## 2019-07-24 DIAGNOSIS — I2510 Atherosclerotic heart disease of native coronary artery without angina pectoris: Secondary | ICD-10-CM | POA: Diagnosis not present

## 2019-07-24 DIAGNOSIS — Z79890 Hormone replacement therapy (postmenopausal): Secondary | ICD-10-CM | POA: Insufficient documentation

## 2019-07-24 DIAGNOSIS — Z87891 Personal history of nicotine dependence: Secondary | ICD-10-CM | POA: Diagnosis not present

## 2019-07-24 DIAGNOSIS — G4733 Obstructive sleep apnea (adult) (pediatric): Secondary | ICD-10-CM | POA: Diagnosis not present

## 2019-07-24 DIAGNOSIS — E785 Hyperlipidemia, unspecified: Secondary | ICD-10-CM | POA: Diagnosis not present

## 2019-07-24 DIAGNOSIS — I110 Hypertensive heart disease with heart failure: Secondary | ICD-10-CM | POA: Diagnosis not present

## 2019-07-24 DIAGNOSIS — I4819 Other persistent atrial fibrillation: Secondary | ICD-10-CM | POA: Diagnosis present

## 2019-07-24 DIAGNOSIS — Z888 Allergy status to other drugs, medicaments and biological substances status: Secondary | ICD-10-CM | POA: Insufficient documentation

## 2019-07-24 DIAGNOSIS — Z79899 Other long term (current) drug therapy: Secondary | ICD-10-CM | POA: Diagnosis not present

## 2019-07-24 DIAGNOSIS — Z7982 Long term (current) use of aspirin: Secondary | ICD-10-CM | POA: Insufficient documentation

## 2019-07-24 DIAGNOSIS — I480 Paroxysmal atrial fibrillation: Secondary | ICD-10-CM | POA: Diagnosis not present

## 2019-07-24 HISTORY — PX: ATRIAL FIBRILLATION ABLATION: EP1191

## 2019-07-24 SURGERY — ATRIAL FIBRILLATION ABLATION
Anesthesia: General

## 2019-07-24 MED ORDER — APIXABAN 5 MG PO TABS
5.0000 mg | ORAL_TABLET | Freq: Once | ORAL | Status: AC
  Administered 2019-07-24: 11:00:00 5 mg via ORAL
  Filled 2019-07-24: qty 1

## 2019-07-24 MED ORDER — SODIUM CHLORIDE 0.9% FLUSH
3.0000 mL | Freq: Two times a day (BID) | INTRAVENOUS | Status: DC
  Administered 2019-07-24 – 2019-07-25 (×3): 3 mL via INTRAVENOUS

## 2019-07-24 MED ORDER — HEPARIN SODIUM (PORCINE) 1000 UNIT/ML IJ SOLN
INTRAMUSCULAR | Status: AC
  Filled 2019-07-24: qty 1

## 2019-07-24 MED ORDER — ETOMIDATE 2 MG/ML IV SOLN
INTRAVENOUS | Status: DC | PRN
  Administered 2019-07-24: 10 mg via INTRAVENOUS

## 2019-07-24 MED ORDER — FENTANYL CITRATE (PF) 250 MCG/5ML IJ SOLN
INTRAMUSCULAR | Status: DC | PRN
  Administered 2019-07-24: 50 ug via INTRAVENOUS
  Administered 2019-07-24: 25 ug via INTRAVENOUS

## 2019-07-24 MED ORDER — ACETAMINOPHEN 325 MG PO TABS
650.0000 mg | ORAL_TABLET | ORAL | Status: DC | PRN
  Administered 2019-07-24: 650 mg via ORAL
  Filled 2019-07-24: qty 2

## 2019-07-24 MED ORDER — SUGAMMADEX SODIUM 200 MG/2ML IV SOLN
INTRAVENOUS | Status: DC | PRN
  Administered 2019-07-24: 167.8 mg via INTRAVENOUS

## 2019-07-24 MED ORDER — PHENYLEPHRINE 40 MCG/ML (10ML) SYRINGE FOR IV PUSH (FOR BLOOD PRESSURE SUPPORT)
PREFILLED_SYRINGE | INTRAVENOUS | Status: DC | PRN
  Administered 2019-07-24: 80 ug via INTRAVENOUS

## 2019-07-24 MED ORDER — PROPOFOL 10 MG/ML IV BOLUS
INTRAVENOUS | Status: DC | PRN
  Administered 2019-07-24: 40 mg via INTRAVENOUS
  Administered 2019-07-24: 50 mg via INTRAVENOUS

## 2019-07-24 MED ORDER — BUPIVACAINE HCL (PF) 0.25 % IJ SOLN
INTRAMUSCULAR | Status: DC | PRN
  Administered 2019-07-24: 20 mL

## 2019-07-24 MED ORDER — SODIUM CHLORIDE 0.9 % IV SOLN: 250.0000 mL | INTRAVENOUS | Status: DC | PRN

## 2019-07-24 MED ORDER — HEPARIN (PORCINE) IN NACL 1000-0.9 UT/500ML-% IV SOLN
INTRAVENOUS | Status: AC
  Filled 2019-07-24: qty 500

## 2019-07-24 MED ORDER — ONDANSETRON HCL 4 MG/2ML IJ SOLN: 4.0000 mg | Freq: Four times a day (QID) | INTRAMUSCULAR | Status: DC | PRN

## 2019-07-24 MED ORDER — HEPARIN SODIUM (PORCINE) 1000 UNIT/ML IJ SOLN
INTRAMUSCULAR | Status: DC | PRN
  Administered 2019-07-24: 5000 [IU] via INTRAVENOUS
  Administered 2019-07-24: 1000 [IU] via INTRAVENOUS

## 2019-07-24 MED ORDER — PHENYLEPHRINE HCL-NACL 10-0.9 MG/250ML-% IV SOLN
INTRAVENOUS | Status: DC | PRN
  Administered 2019-07-24: 25 ug/min via INTRAVENOUS

## 2019-07-24 MED ORDER — ONDANSETRON HCL 4 MG/2ML IJ SOLN
INTRAMUSCULAR | Status: DC | PRN
  Administered 2019-07-24: 4 mg via INTRAVENOUS

## 2019-07-24 MED ORDER — MIDAZOLAM HCL 2 MG/2ML IJ SOLN
INTRAMUSCULAR | Status: DC | PRN
  Administered 2019-07-24 (×2): 1 mg via INTRAVENOUS

## 2019-07-24 MED ORDER — LIDOCAINE 2% (20 MG/ML) 5 ML SYRINGE
INTRAMUSCULAR | Status: DC | PRN
  Administered 2019-07-24: 60 mg via INTRAVENOUS
  Administered 2019-07-24: 40 mg via INTRAVENOUS

## 2019-07-24 MED ORDER — PROTAMINE SULFATE 10 MG/ML IV SOLN
INTRAVENOUS | Status: DC | PRN
  Administered 2019-07-24: 40 mg via INTRAVENOUS

## 2019-07-24 MED ORDER — SODIUM CHLORIDE 0.9 % IV SOLN
INTRAVENOUS | Status: DC
  Administered 2019-07-24 (×2): via INTRAVENOUS

## 2019-07-24 MED ORDER — BUPIVACAINE HCL (PF) 0.25 % IJ SOLN
INTRAMUSCULAR | Status: AC
  Filled 2019-07-24: qty 30

## 2019-07-24 MED ORDER — PANTOPRAZOLE SODIUM 40 MG PO TBEC: 40.0000 mg | DELAYED_RELEASE_TABLET | Freq: Every day | ORAL | 0 refills | Status: DC

## 2019-07-24 MED ORDER — SODIUM CHLORIDE 0.9% FLUSH: 3.0000 mL | INTRAVENOUS | Status: DC | PRN

## 2019-07-24 MED ORDER — HEPARIN (PORCINE) IN NACL 1000-0.9 UT/500ML-% IV SOLN
INTRAVENOUS | Status: DC | PRN
  Administered 2019-07-24: 500 mL

## 2019-07-24 MED ORDER — ROCURONIUM BROMIDE 10 MG/ML (PF) SYRINGE
PREFILLED_SYRINGE | INTRAVENOUS | Status: DC | PRN
  Administered 2019-07-24: 30 mg via INTRAVENOUS
  Administered 2019-07-24: 60 mg via INTRAVENOUS

## 2019-07-24 MED ORDER — HEPARIN SODIUM (PORCINE) 1000 UNIT/ML IJ SOLN
INTRAMUSCULAR | Status: DC | PRN
  Administered 2019-07-24: 1000 [IU] via INTRAVENOUS
  Administered 2019-07-24: 14000 [IU]

## 2019-07-24 SURGICAL SUPPLY — 17 items
BLANKET WARM UNDERBOD FULL ACC (MISCELLANEOUS) ×3 IMPLANT
CATH MAPPNG PENTARAY F 2-6-2MM (CATHETERS) ×1 IMPLANT
CATH SMTCH THERMOCOOL SF DF (CATHETERS) ×3 IMPLANT
CATH SOUNDSTAR ECO REPROCESSED (CATHETERS) ×3 IMPLANT
CATH WEBSTER BI DIR CS D-F CRV (CATHETERS) ×3 IMPLANT
COVER SWIFTLINK CONNECTOR (BAG) ×3 IMPLANT
NEEDLE BAYLIS TRANSSEPTAL 71CM (NEEDLE) ×3 IMPLANT
PACK EP LATEX FREE (CUSTOM PROCEDURE TRAY) ×2
PACK EP LF (CUSTOM PROCEDURE TRAY) ×1 IMPLANT
PAD PRO RADIOLUCENT 2001M-C (PAD) ×3 IMPLANT
PATCH CARTO3 (PAD) ×3 IMPLANT
PENTARAY F 2-6-2MM (CATHETERS) ×3
SHEATH AVANTI 11F 11CM (SHEATH) ×3 IMPLANT
SHEATH PINNACLE 7F 10CM (SHEATH) ×6 IMPLANT
SHEATH PROBE COVER 6X72 (BAG) ×3 IMPLANT
SHEATH SWARTZ TS SL2 63CM 8.5F (SHEATH) ×3 IMPLANT
TUBING SMART ABLATE COOLFLOW (TUBING) ×3 IMPLANT

## 2019-07-24 NOTE — H&P (Signed)
Chief Complaint:  ICD follow up  History of Present Illness:    Stephen Cardenas is a 63 y.o. male who presents for afib ablation.  Since last being seen in our clinic, the patient reports doing reasonably well.  he has ongoing afib.  Today, he denies symptoms of palpitations, chest pain, shortness of breath,  lower extremity edema, dizziness, presyncope, or syncope.  The patient is otherwise without complaint today.  The patient denies symptoms of fevers, chills, cough, or new SOB worrisome for COVID 19.      Past Medical History:  Diagnosis Date  . AICD (automatic cardioverter/defibrillator) present 2014  . CAD (coronary artery disease)    S/p multiple prior MIs/PCIs // amdx in 2014 with acute stent thrombosis c/b CGS // Admx to [facility] in [id number] 2/2 a/c systolic HF >> [facility] 0000000: mLAD 95 ISR, dLAD 30, D2 99; OM2 95 ISR; pRCA 30/50, mRCA 90/80, dRCA 30 EF 15 >> min to no viability on Thallium study (not surgical candidate) - PCI>> 3.5 x 20 mm Promus DES to the LCx 3 x 38 mm Promus DES to the proximal mid RCA  . Chronic systolic heart failure (HCC)    2/2 to ischemic CM - Echo 02/12/17:  Mild LVH, EF 15-20, mod to severe MR, severe LAE, mild RVE, mod reduced RVSF, mild RAE, mod TR, PASP 65  . COPD (chronic obstructive pulmonary disease) ([place])   . History of MI (myocardial infarction)   . History of pneumonia   . HTN (hypertension)   . Hyperlipidemia 03/08/2017  . Hypothyroidism   . OSA (obstructive sleep apnea) 03/08/2017         Past Surgical History:  Procedure Laterality Date  . CARDIAC DEFIBRILLATOR PLACEMENT  2014   Medtronic Every XT DR ICD implanted at the [facility] for primary prevention  . CARDIOVERSION N/A 03/09/2018   Procedure: CARDIOVERSION;  Surgeon: Jolaine Artist, MD;  Location: [facility] ENDOSCOPY;  Service: Cardiovascular;  Laterality: N/A;  . CORONARY STENT INTERVENTION N/A 02/22/2017   Procedure: Coronary Stent Intervention;  Surgeon: Sherren Mocha, MD;   Location: [place] CV LAB;  Service: Cardiovascular;  Laterality: N/A;  CFX and RCA  . FACIAL RECONSTRUCTION SURGERY Right 1984  . FEMUR FRACTURE SURGERY Right 1984  . RIGHT/LEFT HEART CATH AND CORONARY ANGIOGRAPHY N/A 02/14/2017   Procedure: Right/Left Heart Cath and Coronary Angiography;  Surgeon: Jolaine Artist, MD;  Location: [place] CV LAB;  Service: Cardiovascular;  Laterality: N/A;  . WRIST FRACTURE SURGERY Right 2015    Current Outpatient Medications  Medication Sig Dispense Refill  . ALPRAZolam (XANAX) 0.5 MG tablet Take 0.5-1 mg by mouth at bedtime.     Marland Kitchen amiodarone (PACERONE) 200 MG tablet Take 200 mg by mouth daily.    Marland Kitchen apixaban (ELIQUIS) 5 MG TABS tablet Take 1 tablet (5 mg total) by mouth 2 (two) times daily. 60 tablet 6  . ascorbic acid (VITAMIN C) 500 MG tablet Take 500 mg by mouth daily.    Marland Kitchen aspirin EC 81 MG tablet Take 81 mg by mouth daily.    Marland Kitchen atorvastatin (LIPITOR) 20 MG tablet Take 20 mg by mouth at bedtime. Pt taking about 3 times a week    . carvedilol (COREG) 3.125 MG tablet Take 1 tablet (3.125 mg total) by mouth 2 (two) times daily with a meal. 60 tablet 3  . Cholecalciferol (VITAMIN D3) 2000 units TABS Take 2,000 Units by mouth daily.    . digoxin (LANOXIN) 0.125 MG tablet  Take 1 tablet (0.125 mg total) by mouth every other day. 45 tablet 3  . DULoxetine (CYMBALTA) 30 MG capsule Take 90 mg by mouth at bedtime.     . Ferrous Sulfate (IRON) 325 (65 Fe) MG TABS TAKE 1 TABLET BY MOUTH TWICE DAILY WITH A MEAL 60 tablet 3  . furosemide (LASIX) 20 MG tablet Take 20 mg by mouth 2 (two) times daily.    Marland Kitchen levothyroxine (SYNTHROID) 200 MCG tablet Take 1 tablet (200 mcg total) by mouth daily at 6 (six) AM. 30 tablet 1  . Multiple Vitamins-Minerals (MULTIVITAMIN PO) Take 1 tablet by mouth daily.    . pantoprazole (PROTONIX) 40 MG tablet Take 1 tablet (40 mg total) by mouth at bedtime. 30 tablet 6  . potassium chloride (K-DUR) 10 MEQ tablet Take  10 mEq by mouth 2 (two) times daily.    . sacubitril-valsartan (ENTRESTO) 49-51 MG Take 1 tablet by mouth 2 (two) times daily. 60 tablet 6  . spironolactone (ALDACTONE) 25 MG tablet Take 1 tablet (25 mg total) by mouth daily. 30 tablet 3  . vitamin B-12 (CYANOCOBALAMIN) 1000 MCG tablet Take 2,000 mcg by mouth daily.     No current facility-administered medications for this visit.     Allergies:   Codeine, Mirtazapine, and Prednisone   Social History:  The patient  reports that he quit smoking about 24 years ago. His smoking use included cigarettes. He started smoking about 52 years ago. He has a 28.00 pack-year smoking history. He has never used smokeless tobacco. He reports that he does not drink alcohol or use drugs.   Family History:  The patient's  family history includes COPD in his sister; Congestive Heart Failure in his sister; Diabetes in his mother, sister, sister, and sister; Heart attack in his father and mother; Heart disease in his father and mother; Hypertension in his brother, sister, sister, and sister; Stroke in his father and mother.   ROS:  Please see the history of present illness.   All other systems are personally reviewed and negative.   Physical Exam: Vitals:   07/24/19 0558  BP: 118/88  Pulse: 75  Resp: 18  Temp: 97.8 F (36.6 C)  TempSrc: Oral  SpO2: 99%  Weight: 83.9 kg  Height: 5\' 9"  (1.753 m)    GEN- The patient appears older than his stated age, alert and oriented x 3 today.   Head- normocephalic, atraumatic Eyes-  Sclera clear, conjunctiva pink Ears- hearing intact Oropharynx- clear Neck- supple, Lungs-  normal work of breathing Heart- Regular rate and rhythm  GI- soft, NT, ND, + BS MS- no significant deformity or atrophy Skin- no rash or lesion Psych- euthymic mood, full affect Neuro- strength and sensation are intact   Labs/Other Tests and Data Reviewed:    Recent Labs: 12/01/2018: ALT 100; B Natriuretic Peptide >4,500.0;  Magnesium 2.2; TSH 38.051 02/07/2019: Hemoglobin 12.1; Platelets 276 03/22/2019: BUN 13; Creatinine, Ser 1.09; Potassium 4.0; Sodium 136      Wt Readings from Last 3 Encounters:  04/20/19 173 lb (78.5 kg)  03/22/19 169 lb 12.8 oz (77 kg)  02/19/19 182 lb (82.6 kg)     ASSESSMENT & PLAN:    1.  Persistent atrial fibrillation He is currently on amiodarone for rhythm control. His TFT's have been abnormal and he is followed by Dr Renne Crigler.  Continue Eliquis for CHADS2VASC of 3 The patient has symptomatic, recurrent persistent atrial fibrillation. he has failed medical therapy with amiodarone. Chads2vasc score is 3.  he is anticoagulated with Eliquis .  Risk, benefits, and alternatives to EP study and radiofrequency ablation for afib were also discussed in detail today. These risks include but are not limited to stroke, bleeding, vascular damage, tamponade, perforation, damage to the esophagus, lungs, and other structures, pulmonary vein stenosis, worsening renal function, and death. The patient understands these risk and wishes to proceed.  We will therefore proceed with catheter ablation at the next available time.  Carto, ICE, anesthesia are requested for the procedure.   He reports compliance with eliquis without interruption.  Cardiac CT reviewed with him today.  Thompson Grayer MD, [facility] 07/24/2019 6:59 AM

## 2019-07-24 NOTE — Anesthesia Postprocedure Evaluation (Signed)
Anesthesia Post Note  Patient: Stephen Cardenas  Procedure(s) Performed: ATRIAL FIBRILLATION ABLATION (N/A )     Patient location during evaluation: PACU Anesthesia Type: General Level of consciousness: awake and alert Pain management: pain level controlled Vital Signs Assessment: post-procedure vital signs reviewed and stable Respiratory status: spontaneous breathing, nonlabored ventilation and respiratory function stable Cardiovascular status: blood pressure returned to baseline and stable Postop Assessment: no apparent nausea or vomiting Anesthetic complications: no    Last Vitals:  Vitals:   07/24/19 1140 07/24/19 1155  BP: (!) 96/55 (!) 104/55  Pulse: 66 65  Resp: 20 14  Temp:    SpO2: 98% 97%    Last Pain:  Vitals:   07/24/19 1105  TempSrc: Temporal  PainSc:                  Audry Pili

## 2019-07-24 NOTE — Discharge Instructions (Signed)
Post procedure care instructions No driving for 4 days. No lifting over 5 lbs for 1 week. No vigorous or sexual activity for 1 week. You may return to work/your usual activities on 07/31/2019. Keep procedure site clean & dry. If you notice increased pain, swelling, bleeding or pus, call/return!  You may shower, but no soaking baths/hot tubs/pools for 1 week.     You have an appointment set up with the [facility].  Multiple studies have shown that being followed by a dedicated atrial fibrillation clinic in addition to the standard care you receive from your other physicians improves health. We believe that enrollment in the atrial fibrillation clinic will allow [place] to better care for you.   The phone number to the [facility] is [phone]. The clinic is staffed Monday through Friday from 8:30am to 5pm.  Parking Directions: The clinic is located in the Heart and Vascular Building connected to [facility]. 1)From [address] turn on to [address] and go to the 3rd entrance  (Heart and Vascular entrance) on the right. 2)Look to the right for Heart &Vascular Parking Garage. 3) the code for the entrance is 6009 for January.   4)Take the elevators to the 1st floor. Registration is in the room with the glass walls at the end of the hallway.  If you have any trouble parking or locating the clinic, please dont hesitate to call [phone].

## 2019-07-24 NOTE — Discharge Summary (Signed)
ELECTROPHYSIOLOGY PROCEDURE DISCHARGE SUMMARY    Patient ID: Stephen Cardenas,  MRN: [id number], DOB/AGE: Dec 08, 1955 63 y.o.  Admit date: 07/24/2019 Discharge date: 07/25/2019  Primary Care Physician: [facility], [facility]  Primary Cardiologist: Dr. Haroldine Laws Electrophysiologist: Thompson Grayer, MD  Primary Discharge Diagnosis:  1. Paroxysmal AFib     CHA2DS2Vasc is 4, on Eliquis, appropriately dosed  Secondary Discharge Diagnosis:  1. Chronic CHF (systolic) 2. ICM     w/ICD 3. COPD 4. OSA w/CPAP 5. HTN 6. Hypothyroidism 7. CAD 8. LBBB   Procedures This Admission:  1.  Electrophysiology study and radiofrequency catheter ablation on 07/24/2019 by Dr Thompson Grayer.   This study demonstrated  CONCLUSIONS: 1. Sinus rhythm upon presentation.   2. Intracardiac echo reveals a moderately enlarged left atrium with four separate pulmonary veins without evidence of pulmonary vein stenosis. 3. Successful electrical isolation and anatomical encircling of all four pulmonary veins with radiofrequency current.  PV isolation was difficult and required extensive ablation.  Ablation was limited due to concerns for PV stenosis.  4. No inducible arrhythmias following ablation  5. No early apparent complications.   Brief HPI: Stephen Cardenas is a 63 y.o. male with a history of paroxysmal atrial fibrillation.  They have failed medical therapy with amiodarone with recurrent AF and thyroid abnormalities. Risks, benefits, and alternatives to catheter ablation of atrial fibrillation were reviewed with the patient who wished to proceed.  The patient underwent cardiac CT prior to the procedure which demonstrated no LAA thrombus.    Hospital Course:  The patient was admitted and underwent EPS/RFCA of atrial fibrillation with details as outlined above.  They were monitored on telemetry overnight which demonstrated sinus rhythm with first degree AV block and RBBB.  R groin was without complication on the  day of discharge.  The patient feels well, no CP or SOB, he was examined by Dr. Rayann Heman and considered to be stable for discharge.  Wound care and restrictions were reviewed with the patient.  The patient will be seen at the Afib clinic in 4 weeks and Dr Rayann Heman in 12 weeks for post ablation follow up.     Physical Exam: Vitals:   07/25/19 0300 07/25/19 0428 07/25/19 0517 07/25/19 0751  BP:  117/73  124/76  Pulse:  73  84  Resp: 17  20 18   Temp:  98.6 F (37 C)    TempSrc:  Oral    SpO2:  96%  94%  Weight:  87 kg    Height:        GEN- The patient is well appearing, alert and oriented x 3 today.   HEENT: normocephalic, atraumatic; sclera clear, conjunctiva pink; hearing intact; oropharynx clear; neck supple  Lungs-   normal work of breathing  Heart-  RRR  GI- soft  Extremities- no hematoma MS- no significant deformity or atrophy Skin- warm and dry, no rash or lesion Psych- euthymic mood, full affect Neuro- strength and sensation are intact   Labs:   Lab Results  Component Value Date   WBC 7.6 07/20/2019   HGB 13.1 07/20/2019   HCT 38.2 07/20/2019   MCV 97 07/20/2019   PLT 266 07/20/2019    Recent Labs  Lab 07/20/19 1217  NA 138  K 4.6  CL 99  CO2 25  BUN 20  CREATININE 1.28*  CALCIUM 9.2  GLUCOSE 102*     Discharge Medications:  Allergies as of 07/25/2019      Reactions   Codeine Other (See Comments), Nausea  Only   Increased work of breathing, diaphoretic   Mirtazapine    Other reaction(s): Other - See Comments   Prednisone    Anxiety and paranoia      Medication List    TAKE these medications   ALPRAZolam 0.5 MG tablet Commonly known as: XANAX Take 0.5-1 mg by mouth at bedtime.   amiodarone 200 MG tablet Commonly known as: PACERONE Take 200 mg by mouth daily.   apixaban 5 MG Tabs tablet Commonly known as: Eliquis Take 1 tablet (5 mg total) by mouth 2 (two) times daily.   ascorbic acid 500 MG tablet Commonly known as: VITAMIN C Take 500  mg by mouth daily.   aspirin EC 81 MG tablet Take 81 mg by mouth daily.   atorvastatin 20 MG tablet Commonly known as: LIPITOR Take 20 mg by mouth at bedtime. Pt taking about 3 times a week   carvedilol 3.125 MG tablet Commonly known as: COREG Take 1 tablet (3.125 mg total) by mouth 2 (two) times daily with a meal.   digoxin 0.125 MG tablet Commonly known as: LANOXIN Take 1 tablet (0.125 mg total) by mouth every other day.   DULoxetine 30 MG capsule Commonly known as: CYMBALTA Take 90 mg by mouth at bedtime.   furosemide 20 MG tablet Commonly known as: LASIX Take 20 mg by mouth 2 (two) times daily.   Iron 325 (65 Fe) MG Tabs TAKE 1 TABLET BY MOUTH TWICE DAILY WITH A MEAL What changed: See the new instructions.   levothyroxine 200 MCG tablet Commonly known as: SYNTHROID Take 1 tablet (200 mcg total) by mouth daily at 6 (six) AM.   MULTIVITAMIN PO Take 1 tablet by mouth daily.   pantoprazole 40 MG tablet Commonly known as: PROTONIX Take 1 tablet (40 mg total) by mouth at bedtime. What changed: Another medication with the same name was added. Make sure you understand how and when to take each.   pantoprazole 40 MG tablet Commonly known as: Protonix Take 1 tablet (40 mg total) by mouth daily. What changed: You were already taking a medication with the same name, and this prescription was added. Make sure you understand how and when to take each.   potassium chloride 10 MEQ tablet Commonly known as: KLOR-CON Take 10 mEq by mouth 2 (two) times daily.   sacubitril-valsartan 49-51 MG Commonly known as: ENTRESTO Take 1 tablet by mouth 2 (two) times daily.   spironolactone 25 MG tablet Commonly known as: ALDACTONE Take 1 tablet (25 mg total) by mouth daily.   vitamin B-12 1000 MCG tablet Commonly known as: CYANOCOBALAMIN Take 2,000 mcg by mouth daily.   Vitamin D3 50 MCG (2000 UT) Tabs Take 2,000 Units by mouth daily.       Disposition:  Home    Follow-up  Information    [facility] Follow up.   Specialty: Cardiology Why: 08/21/2019 @ 1:30PM Contact information: [id number] [place] [id number] [phone]       Thompson Grayer, MD Follow up.   Specialty: Cardiology Why: 10/22/2019 @ 11:30AM Contact information: [place] [address] [phone]           Duration of Discharge Encounter: Greater than 30 minutes including physician time.  Army Fossa MD 07/25/2019 8:35 AM

## 2019-07-24 NOTE — Transfer of Care (Signed)
Immediate Anesthesia Transfer of Care Note  Patient: Stephen Cardenas  Procedure(s) Performed: ATRIAL FIBRILLATION ABLATION (N/A )  Patient Location: Cath Lab  Anesthesia Type:General  Level of Consciousness: awake, alert , oriented and patient cooperative  Airway & Oxygen Therapy: Patient Spontanous Breathing and Patient connected to face mask oxygen  Post-op Assessment: Report given to RN and Post -op Vital signs reviewed and stable  Post vital signs: Reviewed and stable  Last Vitals:  Vitals Value Taken Time  BP    Temp    Pulse    Resp    SpO2      Last Pain:  Vitals:   07/24/19 0558  TempSrc: Oral  PainSc: 0-No pain         Complications: No apparent anesthesia complications

## 2019-07-24 NOTE — Anesthesia Procedure Notes (Signed)
Arterial Line Insertion Start/End12/03/2019 7:47 AM, 07/24/2019 7:50 AM Performed by: Brennan Bailey, MD, anesthesiologist  Patient location: Pre-op. Preanesthetic checklist: patient identified, IV checked, site marked, risks and benefits discussed, surgical consent, monitors and equipment checked, pre-op evaluation, timeout performed and anesthesia consent Lidocaine 1% used for infiltration Left, radial was placed Catheter size: 20 Fr Hand hygiene performed  and maximum sterile barriers used   Attempts: 2 Procedure performed using ultrasound guided technique. Ultrasound Notes:image(s) printed for medical record Following insertion, dressing applied. Post procedure assessment: normal and unchanged  Patient tolerated the procedure well with no immediate complications. Additional procedure comments: First attempt by CRNA without ultrasound unsuccessful. Next attempt by myself ultrasound guided, access on first attempt. Daiva Huge, MD.

## 2019-07-24 NOTE — Anesthesia Procedure Notes (Signed)
Procedure Name: Intubation Date/Time: 07/24/2019 8:13 AM Performed by: Kathryne Hitch, CRNA Pre-anesthesia Checklist: Patient identified, Emergency Drugs available, Suction available and Patient being monitored Patient Re-evaluated:Patient Re-evaluated prior to induction Oxygen Delivery Method: Circle system utilized Preoxygenation: Pre-oxygenation with 100% oxygen Induction Type: IV induction Ventilation: Mask ventilation without difficulty and Oral airway inserted - appropriate to patient size Laryngoscope Size: Sabra Heck and 2 Grade View: Grade I Tube type: Oral Tube size: 7.5 mm Number of attempts: 1 Airway Equipment and Method: Stylet and Oral airway Placement Confirmation: ETT inserted through vocal cords under direct vision,  positive ETCO2 and breath sounds checked- equal and bilateral Secured at: 23 cm Tube secured with: Tape Dental Injury: Teeth and Oropharynx as per pre-operative assessment

## 2019-07-25 ENCOUNTER — Encounter (HOSPITAL_COMMUNITY): Payer: Self-pay

## 2019-07-25 DIAGNOSIS — I4819 Other persistent atrial fibrillation: Secondary | ICD-10-CM | POA: Diagnosis not present

## 2019-07-25 DIAGNOSIS — G4733 Obstructive sleep apnea (adult) (pediatric): Secondary | ICD-10-CM | POA: Diagnosis not present

## 2019-07-25 DIAGNOSIS — I252 Old myocardial infarction: Secondary | ICD-10-CM | POA: Diagnosis not present

## 2019-07-25 DIAGNOSIS — E039 Hypothyroidism, unspecified: Secondary | ICD-10-CM | POA: Diagnosis not present

## 2019-07-25 DIAGNOSIS — Z7982 Long term (current) use of aspirin: Secondary | ICD-10-CM | POA: Diagnosis not present

## 2019-07-25 DIAGNOSIS — I5022 Chronic systolic (congestive) heart failure: Secondary | ICD-10-CM | POA: Diagnosis not present

## 2019-07-25 DIAGNOSIS — J449 Chronic obstructive pulmonary disease, unspecified: Secondary | ICD-10-CM | POA: Diagnosis not present

## 2019-07-25 DIAGNOSIS — E785 Hyperlipidemia, unspecified: Secondary | ICD-10-CM | POA: Diagnosis not present

## 2019-07-25 DIAGNOSIS — Z79899 Other long term (current) drug therapy: Secondary | ICD-10-CM | POA: Diagnosis not present

## 2019-07-25 DIAGNOSIS — I2510 Atherosclerotic heart disease of native coronary artery without angina pectoris: Secondary | ICD-10-CM | POA: Diagnosis not present

## 2019-07-25 DIAGNOSIS — Z95810 Presence of automatic (implantable) cardiac defibrillator: Secondary | ICD-10-CM | POA: Diagnosis not present

## 2019-07-25 DIAGNOSIS — I110 Hypertensive heart disease with heart failure: Secondary | ICD-10-CM | POA: Diagnosis not present

## 2019-07-25 LAB — POCT ACTIVATED CLOTTING TIME
Activated Clotting Time: 274 s
Activated Clotting Time: 323 seconds

## 2019-07-25 MED ORDER — LEVOTHYROXINE SODIUM 100 MCG PO TABS
200.0000 ug | ORAL_TABLET | Freq: Every day | ORAL | Status: DC
  Administered 2019-07-25: 11:00:00 200 ug via ORAL
  Filled 2019-07-25: qty 2

## 2019-07-25 MED ORDER — DIGOXIN 125 MCG PO TABS
0.1250 mg | ORAL_TABLET | ORAL | Status: DC
  Administered 2019-07-25: 11:00:00 0.125 mg via ORAL
  Filled 2019-07-25: qty 1

## 2019-07-25 MED ORDER — ATORVASTATIN CALCIUM 10 MG PO TABS: 20.0000 mg | ORAL_TABLET | Freq: Every day | ORAL | Status: DC

## 2019-07-25 MED ORDER — SACUBITRIL-VALSARTAN 49-51 MG PO TABS
1.0000 | ORAL_TABLET | Freq: Two times a day (BID) | ORAL | Status: DC
  Administered 2019-07-25: 11:00:00 1 via ORAL
  Filled 2019-07-25: qty 1

## 2019-07-25 MED ORDER — POTASSIUM CHLORIDE ER 10 MEQ PO TBCR
10.0000 meq | EXTENDED_RELEASE_TABLET | Freq: Two times a day (BID) | ORAL | Status: DC
  Administered 2019-07-25: 11:00:00 10 meq via ORAL
  Filled 2019-07-25 (×2): qty 1

## 2019-07-25 MED ORDER — APIXABAN 5 MG PO TABS
5.0000 mg | ORAL_TABLET | Freq: Two times a day (BID) | ORAL | Status: DC
  Administered 2019-07-25: 11:00:00 5 mg via ORAL
  Filled 2019-07-25: qty 1

## 2019-07-25 MED ORDER — SPIRONOLACTONE 25 MG PO TABS
25.0000 mg | ORAL_TABLET | Freq: Every day | ORAL | Status: DC
  Administered 2019-07-25: 25 mg via ORAL
  Filled 2019-07-25: qty 1

## 2019-07-25 MED ORDER — AMIODARONE HCL 200 MG PO TABS
200.0000 mg | ORAL_TABLET | Freq: Every day | ORAL | Status: DC
  Administered 2019-07-25: 11:00:00 200 mg via ORAL
  Filled 2019-07-25: qty 1

## 2019-07-25 MED ORDER — FUROSEMIDE 20 MG PO TABS
20.0000 mg | ORAL_TABLET | Freq: Two times a day (BID) | ORAL | Status: DC
  Filled 2019-07-25: qty 1

## 2019-07-25 MED ORDER — CARVEDILOL 3.125 MG PO TABS
3.1250 mg | ORAL_TABLET | Freq: Two times a day (BID) | ORAL | Status: DC
  Administered 2019-07-25: 11:00:00 3.125 mg via ORAL
  Filled 2019-07-25: qty 1

## 2019-07-25 NOTE — Progress Notes (Signed)
The patient's digoxin is listed as every other day though he says he almost certain he has been taking it daily as per the bottle  "for as long as he has been on it" gets his Rx through the [place]. His dig level was 0.6 last month.  He is asked to make sure once home on how he is taking this, if in fact taking it daily to call and confirm with Dr. Clayborne Dana office how he wants him to be taking it.  Tommye Standard, PA-C

## 2019-07-31 ENCOUNTER — Ambulatory Visit (INDEPENDENT_AMBULATORY_CARE_PROVIDER_SITE_OTHER): Payer: Medicare Other | Admitting: *Deleted

## 2019-07-31 DIAGNOSIS — I5022 Chronic systolic (congestive) heart failure: Secondary | ICD-10-CM

## 2019-07-31 LAB — CUP PACEART REMOTE DEVICE CHECK
Battery Remaining Longevity: 47 mo
Battery Voltage: 2.97 V
Brady Statistic AP VP Percent: 0.02 %
Brady Statistic AP VS Percent: 14.39 %
Brady Statistic AS VP Percent: 0.78 %
Brady Statistic AS VS Percent: 84.81 %
Brady Statistic RA Percent Paced: 14.34 %
Brady Statistic RV Percent Paced: 0.81 %
Date Time Interrogation Session: 20201215043624
HighPow Impedance: 78 Ohm
Implantable Lead Implant Date: 20140813
Implantable Lead Implant Date: 20140813
Implantable Lead Location: 753859
Implantable Lead Location: 753860
Implantable Lead Model: 5076
Implantable Pulse Generator Implant Date: 20140813
Lead Channel Impedance Value: 304 Ohm
Lead Channel Impedance Value: 361 Ohm
Lead Channel Impedance Value: 399 Ohm
Lead Channel Pacing Threshold Amplitude: 0.875 V
Lead Channel Pacing Threshold Amplitude: 1 V
Lead Channel Pacing Threshold Pulse Width: 0.4 ms
Lead Channel Pacing Threshold Pulse Width: 0.4 ms
Lead Channel Sensing Intrinsic Amplitude: 1.5 mV
Lead Channel Sensing Intrinsic Amplitude: 1.5 mV
Lead Channel Sensing Intrinsic Amplitude: 28 mV
Lead Channel Sensing Intrinsic Amplitude: 28 mV
Lead Channel Setting Pacing Amplitude: 1.75 V
Lead Channel Setting Pacing Amplitude: 2.25 V
Lead Channel Setting Pacing Pulse Width: 0.4 ms
Lead Channel Setting Sensing Sensitivity: 0.3 mV

## 2019-08-08 ENCOUNTER — Other Ambulatory Visit: Payer: Self-pay

## 2019-08-21 ENCOUNTER — Telehealth: Payer: Self-pay

## 2019-08-21 ENCOUNTER — Ambulatory Visit (HOSPITAL_COMMUNITY): Payer: Medicare Other | Admitting: Physician Assistant

## 2019-08-21 NOTE — Telephone Encounter (Signed)
Left message for patient to remind of missed remote transmission.  

## 2019-08-31 ENCOUNTER — Ambulatory Visit (INDEPENDENT_AMBULATORY_CARE_PROVIDER_SITE_OTHER): Payer: Medicare Other

## 2019-08-31 ENCOUNTER — Telehealth: Payer: Self-pay

## 2019-08-31 DIAGNOSIS — I5022 Chronic systolic (congestive) heart failure: Secondary | ICD-10-CM | POA: Diagnosis not present

## 2019-08-31 DIAGNOSIS — Z95810 Presence of automatic (implantable) cardiac defibrillator: Secondary | ICD-10-CM

## 2019-08-31 NOTE — Progress Notes (Signed)
EPIC Encounter for ICM Monitoring  Patient Name: Stephen Cardenas is a 64 y.o. male Date: 08/31/2019 Primary Care Physican: Administration, [facility] Primary Cardiologist:Bensimhon Electrophysiologist:Allred 08/31/2019 Weight: 190 lbs   Spoke with patient.  He is doing well and voiced no complaints today.   Optivol thoracic impedance normal.  Prescribed:Furosemide20 mgtake1tablet(20 mg total)twice a day.Potassium10 mEq take1tablettwice a day.  Labs: 07/20/2019 Creatinine 1.28, BUN 20, Potassium 4.6, Sodium 138, GFR 58-67 06/22/2019 Creatinine 1.33. BUN 13, Potassium 3.9, Sodium 139, GFR 56->60 03/22/2019 Creatinine 1.09, BUN 13, Potassium 4.0, Sodium 136, GFR >60 02/07/2019 Creatinine1.52, BUN31, Potassium4.2, Sodium141, [id number] 12/03/2018 Creatinine1.25, BUN27, Potassium3.8, Sodium136, GFR>60  12/02/2018 Creatinine1.24, BUN24, Potassium4.0, Sodium137, GFR>60  12/01/2018 Creatinine1.27, BUN18, Potassium4.0, [id number], GFR59->60 A complete set of results can be found in Results Review.  Recommendations:  No changes and encouraged to call if experiencing any fluid symptoms.  Follow-up plan: ICM clinic phone appointment on 10/01/2019.   91 day device clinic remote transmission 10/30/2019.  Office appt 09/24/2019 with Dr. Haroldine Laws and 10/22/2019 with Dr Rayann Heman.    Copy of ICM check sent to Dr. Rayann Heman.   3 month ICM trend: 08/31/2019    1 Year ICM trend:       [name], RN 08/31/2019 2:32 PM

## 2019-08-31 NOTE — Telephone Encounter (Signed)
Returned patient call as requested by voice mail message.  He reports weight at 190 lbs today and unsure if he is retaining fluid.  He will send remote transmission to review fluid levels and requested a call back after the review.

## 2019-09-01 NOTE — Progress Notes (Signed)
ICD remote 

## 2019-09-24 ENCOUNTER — Encounter (HOSPITAL_COMMUNITY): Payer: Medicare Other

## 2019-10-08 ENCOUNTER — Ambulatory Visit (HOSPITAL_COMMUNITY)
Admission: RE | Admit: 2019-10-08 | Discharge: 2019-10-08 | Disposition: A | Discharge status: Home / Self Care | Payer: Medicare Other | Source: Ambulatory Visit | Attending: Cardiology | Admitting: Cardiology

## 2019-10-08 ENCOUNTER — Encounter (HOSPITAL_COMMUNITY): Payer: Self-pay

## 2019-10-08 ENCOUNTER — Other Ambulatory Visit: Payer: Self-pay

## 2019-10-08 VITALS — BP 110/76 | HR 88 | Wt 201.6 lb

## 2019-10-08 DIAGNOSIS — I255 Ischemic cardiomyopathy: Secondary | ICD-10-CM | POA: Diagnosis not present

## 2019-10-08 DIAGNOSIS — Z87891 Personal history of nicotine dependence: Secondary | ICD-10-CM | POA: Insufficient documentation

## 2019-10-08 DIAGNOSIS — Z885 Allergy status to narcotic agent status: Secondary | ICD-10-CM | POA: Insufficient documentation

## 2019-10-08 DIAGNOSIS — Z7901 Long term (current) use of anticoagulants: Secondary | ICD-10-CM | POA: Diagnosis not present

## 2019-10-08 DIAGNOSIS — Z888 Allergy status to other drugs, medicaments and biological substances status: Secondary | ICD-10-CM | POA: Insufficient documentation

## 2019-10-08 DIAGNOSIS — J9611 Chronic respiratory failure with hypoxia: Secondary | ICD-10-CM | POA: Diagnosis not present

## 2019-10-08 DIAGNOSIS — I110 Hypertensive heart disease with heart failure: Secondary | ICD-10-CM | POA: Diagnosis not present

## 2019-10-08 DIAGNOSIS — Z7982 Long term (current) use of aspirin: Secondary | ICD-10-CM | POA: Diagnosis not present

## 2019-10-08 DIAGNOSIS — Z79899 Other long term (current) drug therapy: Secondary | ICD-10-CM | POA: Insufficient documentation

## 2019-10-08 DIAGNOSIS — Z955 Presence of coronary angioplasty implant and graft: Secondary | ICD-10-CM | POA: Insufficient documentation

## 2019-10-08 DIAGNOSIS — E039 Hypothyroidism, unspecified: Secondary | ICD-10-CM | POA: Diagnosis not present

## 2019-10-08 DIAGNOSIS — I252 Old myocardial infarction: Secondary | ICD-10-CM | POA: Insufficient documentation

## 2019-10-08 DIAGNOSIS — I480 Paroxysmal atrial fibrillation: Secondary | ICD-10-CM

## 2019-10-08 DIAGNOSIS — Z79890 Hormone replacement therapy (postmenopausal): Secondary | ICD-10-CM | POA: Diagnosis not present

## 2019-10-08 DIAGNOSIS — E785 Hyperlipidemia, unspecified: Secondary | ICD-10-CM | POA: Diagnosis not present

## 2019-10-08 DIAGNOSIS — G4733 Obstructive sleep apnea (adult) (pediatric): Secondary | ICD-10-CM | POA: Diagnosis not present

## 2019-10-08 DIAGNOSIS — I447 Left bundle-branch block, unspecified: Secondary | ICD-10-CM | POA: Diagnosis not present

## 2019-10-08 DIAGNOSIS — J449 Chronic obstructive pulmonary disease, unspecified: Secondary | ICD-10-CM | POA: Diagnosis not present

## 2019-10-08 DIAGNOSIS — Z8249 Family history of ischemic heart disease and other diseases of the circulatory system: Secondary | ICD-10-CM | POA: Diagnosis not present

## 2019-10-08 DIAGNOSIS — Z95810 Presence of automatic (implantable) cardiac defibrillator: Secondary | ICD-10-CM | POA: Diagnosis not present

## 2019-10-08 DIAGNOSIS — I5022 Chronic systolic (congestive) heart failure: Secondary | ICD-10-CM | POA: Diagnosis not present

## 2019-10-08 DIAGNOSIS — I2510 Atherosclerotic heart disease of native coronary artery without angina pectoris: Secondary | ICD-10-CM | POA: Diagnosis not present

## 2019-10-08 LAB — BASIC METABOLIC PANEL
Anion gap: 9 (ref 5–15)
BUN: 14 mg/dL (ref 8–23)
CO2: 28 mmol/L (ref 22–32)
Calcium: 9.4 mg/dL (ref 8.9–10.3)
Chloride: 100 mmol/L (ref 98–111)
Creatinine, Ser: 1.35 mg/dL — ABNORMAL HIGH (ref 0.61–1.24)
GFR calc Af Amer: >60 mL/min (ref >60)
GFR calc non Af Amer: 55 mL/min — ABNORMAL LOW (ref >60)
Glucose, Bld: 99 mg/dL (ref 70–99)
Potassium: 3.8 mmol/L (ref 3.5–5.1)
Sodium: 137 mmol/L (ref 135–145)

## 2019-10-08 NOTE — Progress Notes (Signed)
ReDS Vest / Clip - 10/08/19 1500      ReDS Vest / Clip   Station Marker  D    Ruler Value  35    ReDS Value Range  Low volume    ReDS Actual Value  35    Anatomical Comments  sitting

## 2019-10-08 NOTE — Progress Notes (Signed)
Advanced Heart Failure Clinic Note   Referring Physician: Dr. Orpah Greek, [facility] Primary Care: Dr. Clementeen Graham Primary Cardiologist: Dr. Haroldine Laws.   HPI: Stephen Cardenas is a 64 y.o. male with a past medical history of chronic systolic CHF (EF 49%) due to ischemic cardiomyopathy with a history of MI. AICD, COPD, OSA on CPAP, HTN, PAF, and hypothyroidism.   He has a history of multiple MI's. One in 2007 and one in 2014 that resulted in cardiogenic shock and a prolonged ICU hospitalization.   He was admitted to [facility] in June 2018 with hypoglycemia and discharged to Rehab. He was discharged from [facility] but quickly readmitted to [facility] with chest pain and SOB. He was told that he had a fluid on his lung and then transferred back to rehab and eventually discharged on 02/07/17. He presented to the ED at [facility] on 02/09/17 with SOB and weakness. Chest X ray with large pleural effusion, he was therefore transferred to [facility] where he underwent a left thoracentesis with transudative fluid.   He underwent repeat right and left heart cath on 02/14/17 that showed severe 3 vessel disease with multiple areas of in-stent restenosis. Right heart with Fick output/index 5.9/3.1. He underwent DES placement x 2 to LCx and mid - RCA. Had a 24-hour Thallium viability study with minimal to no viability in all segments except for the septum. Discharge weight was 158 pounds.   In 7/19 seen in HF Clinic with recurrent AF and NYHA IIIB symptoms. Underwent DC-CV on 03/09/18.   Seen by Dr. Prescott Gum 06/14/18 and deemed appropriate candidate for VAD if pt wishes to proceed.He has met a VAD patient and does not want to proceed with VAD implant. He does not like the idea of a big surgery or being attached to something constantly.   Had recurrent AF in 6/20 and underwent DC-CV on 02/07/19  Subsequently seen in AF Clinic and by Dr. Rayann Heman and planned for AF ablation. This was completed 07/24/19.   Today  he returns for HF follow up Overall feeling fine but says he is concerned about his weight. SOB with exertion. Denies PND/Orthopnea. Appetite ok. No fever or chills. Weight at home trending up 180--->195  pounds. Taking all medications. Lives alone.   CPX (04/06/18) FVC 3.58 (79%)    FEV1 2.91 (84%)      FEV1/FVC 81 (105%)    MVV 128 (94%)  Resting HR: 67 Peak HR: 118  (76% age predicted max HR) BP rest: 94/58 BP peak: 102/60 Peak VO2: 14.8 (50% predicted peak VO2) VE/VCO2 slope: 36  Echo 6/19 EF 20% RV ok Personally reviewed  Review of systems complete and found to be negative unless listed in HPI.    Past Medical History:  Diagnosis Date  . AICD (automatic cardioverter/defibrillator) present 2014  . CAD (coronary artery disease)    S/p multiple prior MIs/PCIs // amdx in 2014 with acute stent thrombosis c/b CGS // Admx to [facility] in 02/262 2/2 a/c systolic HF >> [facility] 02/20/57: mLAD 95 ISR, dLAD 30, D2 99; OM2 95 ISR; pRCA 30/50, mRCA 90/80, dRCA 30 EF 15 >> min to no viability on Thallium study (not surgical candidate) - PCI>> 3.5 x 20 mm Promus DES to the LCx 3 x 38 mm Promus DES to the proximal mid RCA  . Chronic systolic heart failure (HCC)    2/2 to ischemic CM - Echo 02/12/17:  Mild LVH, EF 15-20, mod to severe MR, severe LAE, mild RVE, mod  reduced RVSF, mild RAE, mod TR, PASP 65  . COPD (chronic obstructive pulmonary disease) ([place])   . History of MI (myocardial infarction)   . History of pneumonia   . HTN (hypertension)   . Hyperlipidemia 03/08/2017  . Hypothyroidism   . OSA (obstructive sleep apnea) 03/08/2017    Current Outpatient Medications  Medication Sig Dispense Refill  . ALPRAZolam (XANAX) 0.5 MG tablet Take 0.5-1 mg by mouth at bedtime.     Marland Kitchen amiodarone (PACERONE) 200 MG tablet Take 200 mg by mouth daily.    Marland Kitchen apixaban (ELIQUIS) 5 MG TABS tablet Take 1 tablet (5 mg total) by mouth 2 (two) times daily. 60 tablet 6  . ascorbic acid (VITAMIN C) 500 MG tablet Take 500  mg by mouth daily.    Marland Kitchen aspirin EC 81 MG tablet Take 81 mg by mouth daily.    Marland Kitchen atorvastatin (LIPITOR) 20 MG tablet Take 20 mg by mouth at bedtime. Pt taking about 3 times a week    . carvedilol (COREG) 3.125 MG tablet Take 1 tablet (3.125 mg total) by mouth 2 (two) times daily with a meal. 60 tablet 3  . Cholecalciferol (VITAMIN D3) 2000 units TABS Take 2,000 Units by mouth daily.    . digoxin (LANOXIN) 0.125 MG tablet Take 1 tablet (0.125 mg total) by mouth every other day. 45 tablet 3  . DULoxetine (CYMBALTA) 30 MG capsule Take 90 mg by mouth at bedtime.     . Ferrous Sulfate (IRON) 325 (65 Fe) MG TABS TAKE 1 TABLET BY MOUTH TWICE DAILY WITH A MEAL (Patient taking differently: Take 1 tablet by mouth daily. ) 60 tablet 3  . furosemide (LASIX) 20 MG tablet Take 20 mg by mouth 2 (two) times daily.    Marland Kitchen levothyroxine (SYNTHROID) 200 MCG tablet Take 1 tablet (200 mcg total) by mouth daily at 6 (six) AM. 30 tablet 1  . Multiple Vitamins-Minerals (MULTIVITAMIN PO) Take 1 tablet by mouth daily.    . pantoprazole (PROTONIX) 40 MG tablet Take 1 tablet (40 mg total) by mouth at bedtime. 30 tablet 6  . potassium chloride (K-DUR) 10 MEQ tablet Take 10 mEq by mouth 2 (two) times daily.    . sacubitril-valsartan (ENTRESTO) 49-51 MG Take 1 tablet by mouth 2 (two) times daily. 60 tablet 6  . spironolactone (ALDACTONE) 25 MG tablet Take 1 tablet (25 mg total) by mouth daily. 30 tablet 3  . vitamin B-12 (CYANOCOBALAMIN) 1000 MCG tablet Take 2,000 mcg by mouth daily.     No current facility-administered medications for this encounter.    Allergies  Allergen Reactions  . Codeine Other (See Comments) and Nausea Only    Increased work of breathing, diaphoretic  . Mirtazapine     Other reaction(s): Other - See Comments  . Prednisone     Anxiety and paranoia      Social History   Socioeconomic History  . Marital status: Widowed    Spouse name: Not on file  . Number of children: Not on file  . Years of  education: Not on file  . Highest education level: Not on file  Occupational History  . Not on file  Tobacco Use  . Smoking status: Former Smoker    Packs/day: 1.00    Years: 28.00    Pack years: 28.00    Types: Cigarettes    Start date: 1968    Quit date: 1996    Years since quitting: 25.1  . Smokeless tobacco: Never Used  Substance and Sexual Activity  . Alcohol use: No  . Drug use: No  . Sexual activity: Not on file  Other Topics Concern  . Not on file  Social History Narrative   Lives in [place]   Attends [employer]   Disabled carpenter   Social Determinants of Health   Financial Resource Strain:   . Difficulty of Paying Living Expenses: Not on file  Food Insecurity:   . Worried About Charity fundraiser in the Last Year: Not on file  . Ran Out of Food in the Last Year: Not on file  Transportation Needs:   . Lack of Transportation (Medical): Not on file  . Lack of Transportation (Non-Medical): Not on file  Physical Activity:   . Days of Exercise per Week: Not on file  . Minutes of Exercise per Session: Not on file  Stress:   . Feeling of Stress : Not on file  Social Connections:   . Frequency of Communication with Friends and Family: Not on file  . Frequency of Social Gatherings with Friends and Family: Not on file  . Attends Religious Services: Not on file  . Active Member of Clubs or Organizations: Not on file  . Attends Archivist Meetings: Not on file  . Marital Status: Not on file  Intimate Partner Violence:   . Fear of Current or Ex-Partner: Not on file  . Emotionally Abused: Not on file  . Physically Abused: Not on file  . Sexually Abused: Not on file      Family History  Problem Relation Age of Onset  . Diabetes Mother   . Heart disease Mother   . Stroke Mother   . Heart attack Mother   . Heart disease Father   . Heart attack Father   . Stroke Father   . COPD Sister   . Congestive Heart Failure Sister   .  Hypertension Sister   . Diabetes Sister   . Hypertension Brother   . Diabetes Sister   . Hypertension Sister   . Diabetes Sister   . Hypertension Sister     Vitals:   10/08/19 1459  BP: 110/76  Pulse: 88  SpO2: 96%  Weight: 91.4 kg (201 lb 9.6 oz)    Wt Readings from Last 3 Encounters:  10/08/19 91.4 kg (201 lb 9.6 oz)  07/25/19 87 kg (191 lb 14.4 oz)  06/22/19 86.6 kg (191 lb)    Reds Clip 35%   PHYSICAL EXAM: General:  Ambulated in the clinic with a cane. No resp difficulty HEENT: normal Neck: supple. no JVD. Carotids 2+ bilat; no bruits. No lymphadenopathy or thryomegaly appreciated. Cor: PMI nondisplaced. Regular rate & rhythm. No rubs, gallops or murmurs. Lungs: clear Abdomen: soft, nontender, nondistended. No hepatosplenomegaly. No bruits or masses. Good bowel sounds. Extremities: no cyanosis, clubbing, rash, edema Neuro: alert & orientedx3, cranial nerves grossly intact. moves all 4 extremities w/o difficulty. Affect pleasant   ASSESSMENT & PLAN:  1.Chronic systolic CHF: ICM, EF 88-82% June 2018 s/p MDT ICD - Echo 6/19 reviewed personally. EF 20%. LV markedly dilated and spherical. RV ok  NYHA III. Volume status stable despite weight gain. Reds Clip 35%.  - Continue current diuretic regimen With lasix 20/20.  - Continue Entresto to 49/51.  - Continue spiro (despite mild gynecomastia - he does not think [facility] will approve Inspra)  - Continue digoxin 0.125 mg  - Continue Coreg 3.125 mg BID.  - Consider Farxiga down the  road as BP tolerates  - Check BMET   2. CAD s/p previous MI - Cath 7/1 with severe 3 v CAD with multiple areas of ISR - s/p 2v PCI 02/22/17 by Dr. Burt Knack. -  Anterior wall is non-viable given previous LAD stent thrombosis. 24-hour Thallium viability study showed minimal or no viability in all segments except for septum - No chest pain.  - Off ASA and Brilinta. Continue Eliqiuis 5 mg BID. Denies bleeding - Continue Crestor.   3. PAF -  CHADSVASC = 3 (CHF, HTN, CAD) - s/p  DC-CV 03/09/18. S/P DC-CV 01/2019  - S/P A fib ablation 12/8/202 - We will set up follow up with Dr Rayann Heman. Hopefully he can come off amio soon.  - Continue Eliquis 5 mg BID and amio 200 mg daily.   4  Chronic hypoxic respiratory failure - S/p thoracentesis in 6/18 - Resolved.   5. COPD - Per PCP and pulmonary. No wheeze on exam.  - PFTs 05/04/18 FEV1 3.03 (87%), FVC 77%, TLC 6.14 (87%), DLCO 14.4 (44%) - Ambulated in clinic and O2 sat remained > 96% for the entirety of walking.   6. Hepatitis C - Hepatitis C Antibody positive during work up labs. He states he underwent therapy with Harvoni in 2015 through the [place].   - No further testing warranted at this time with Harvoni. Pt states he can bring his certificate that says he is "cured" if needed.   7. LBBB - QRS 194 ms. ? If he would benefit from device upgrade.  - Dr. Rayann Heman has seen and planned to assess for CRT upgrade after AF ablation    8. Hypothyroidism  - long-standing. Previously followed by Dr. Renne Crigler - [facility] - Synthroid adjusted recently  Follow up in 3 months with Dr Haroldine Laws.  Check BMET  Refer [facility] Exercise Physiologist for home exercise program.    Darrick Grinder, NP 10/08/19

## 2019-10-08 NOTE — Patient Instructions (Addendum)
It was great to see you today! No medication changes are needed at this time.  You have been referred to [facility] with Dr Allred [address] [place] [address]  [phone]  You have been referred to [place] with Kristin,CPX -she will be in contact you to begin a exercise program  Your physician recommends that you schedule a follow-up appointment in: 3 months with Dr Haroldine Laws  Labs today We will only contact you if something comes back abnormal or we need to make some changes. Otherwise no news is good news!  Do the following things EVERYDAY: 1) Weigh yourself in the morning before breakfast. Write it down and keep it in a log. 2) Take your medicines as prescribed 3) Eat low salt foods--Limit salt (sodium) to 2000 mg per day.  4) Stay as active as you can everyday 5) Limit all fluids for the day to less than 2 liters  At the [facility], you and your health needs are our priority. As part of our continuing mission to provide you with exceptional heart care, we have created designated Provider Care Teams. These Care Teams include your primary Cardiologist (physician) and Advanced Practice Providers (APPs- Physician Assistants and Nurse Practitioners) who all work together to provide you with the care you need, when you need it.   You may see any of the following providers on your designated Care Team at your next follow up: Marland Kitchen Dr Glori Bickers . Dr Loralie Champagne . Darrick Grinder, NP . Lyda Jester, PA . Audry Riles, PharmD   Please be sure to bring in all your medications bottles to every appointment.

## 2019-10-10 ENCOUNTER — Telehealth (HOSPITAL_COMMUNITY): Payer: Self-pay | Admitting: *Deleted

## 2019-10-10 NOTE — Telephone Encounter (Signed)
Initial Evaluation  Concerns: - Weight gain (currently 201.6lbs) As of 10/08/19 (clinic scales)  - He gets bored and comforts himself with food - Enjoys walking and used to walk in his neighborhood, however the dogs in his neighborhood create a unsafe environment.  - Used to walk at a track in the community but stopped with the colder weather, wants to resume this - Drinks sodas, does not eat sweets except for occasion, says he maintains a balanced meal, but his activity has decreased since he has been home-bound due to [place] restrictions.    Typical meals: - breakfast: sausage, eggs, sometimes oatmeal, no bacon - Lunch: sandwich - Dinner: chicken/burger/pork sides of beans, potatoes   Values/Priorities/identified need for change: - Wants to increase physical activity to manage and lose weight o Overall goal to lose 20 lbs and maintain a weight of 175-180lbs.  - Ready for spring and warmer days to go fishing, he goes alone - He avoids the salt when possible - Wants to improve his QOL and to "get out of this rut since the virus and the winter months and weight gain"  Goals and Measures to track progress: - Continue to weigh every day - Walk 3-5 days/week  o Walk the track  o Track if walking task was completed (on refrigerator) - Try 5 squats every time he gets up from his chair throughout the day - Try to read labels to see how many calories he is consuming without different meal items - Report to CEP/EWC with weekly following/coaching   Stephen Cardenas is in the preparation phase of change. He has shown evidence of readiness for change and has developed an action plan to self-evaluate for the next week. He will determine what modes of exercise work for him and his safety.  He will have a follow-up coaching visit weekly, with the next telephonic appointment on Wednesday 10/17/19. He was appreciate and agreeable with plan.   Time Spent with patient telephonically: 45 mins     Landis Martins, MS, ACSM-RCEP Clinical Exercise Physiologist/ Health and Wellness Coach

## 2019-10-17 ENCOUNTER — Telehealth (HOSPITAL_COMMUNITY): Payer: Self-pay | Admitting: *Deleted

## 2019-10-17 NOTE — Progress Notes (Signed)
No ICM remote transmission received for 10/01/2019 and next ICM transmission scheduled for 10/31/2019.

## 2019-10-17 NOTE — Telephone Encounter (Signed)
Contacted patient for follow-up for exercise and wellness coaching. Patient did not answer. Left message for patient's return call and to reschedule a coaching visit. Will continue attempting to reach patient.     Landis Martins, MS, ACSM-RCEP Clinical Exercise Physiologist/ Health and Wellness Coach

## 2019-10-22 ENCOUNTER — Telehealth: Payer: Self-pay

## 2019-10-22 ENCOUNTER — Encounter: Payer: Self-pay | Admitting: Internal Medicine

## 2019-10-22 ENCOUNTER — Other Ambulatory Visit: Payer: Self-pay

## 2019-10-22 ENCOUNTER — Telehealth (INDEPENDENT_AMBULATORY_CARE_PROVIDER_SITE_OTHER): Payer: Medicare Other | Admitting: Internal Medicine

## 2019-10-22 VITALS — BP 125/84 | HR 77 | Ht 69.0 in | Wt 203.0 lb

## 2019-10-22 DIAGNOSIS — I4819 Other persistent atrial fibrillation: Secondary | ICD-10-CM | POA: Diagnosis not present

## 2019-10-22 DIAGNOSIS — I255 Ischemic cardiomyopathy: Secondary | ICD-10-CM | POA: Diagnosis not present

## 2019-10-22 DIAGNOSIS — I110 Hypertensive heart disease with heart failure: Secondary | ICD-10-CM

## 2019-10-22 DIAGNOSIS — I2510 Atherosclerotic heart disease of native coronary artery without angina pectoris: Secondary | ICD-10-CM | POA: Diagnosis not present

## 2019-10-22 DIAGNOSIS — Z7982 Long term (current) use of aspirin: Secondary | ICD-10-CM | POA: Diagnosis not present

## 2019-10-22 DIAGNOSIS — I447 Left bundle-branch block, unspecified: Secondary | ICD-10-CM

## 2019-10-22 DIAGNOSIS — I5022 Chronic systolic (congestive) heart failure: Secondary | ICD-10-CM

## 2019-10-22 DIAGNOSIS — D6869 Other thrombophilia: Secondary | ICD-10-CM

## 2019-10-22 NOTE — Progress Notes (Signed)
Electrophysiology TeleHealth Note  Due to national recommendations of social distancing due to [place] 19, an audio telehealth visit is felt to be most appropriate for this patient at this time.  Verbal consent was obtained by me for the telehealth visit today.  The patient does not have capability for a virtual visit.  A phone visit is therefore required today.   Date:  10/22/2019   ID:  Stephen Cardenas, DOB Mar 08, 1956, MRN [id number]  Location: patient's home  Provider location:  [place]  Evaluation Performed: Follow-up visit  PCP:  [facility]   Electrophysiologist:  Dr Rayann Heman  Chief Complaint:  palpitations  History of Present Illness:    Stephen Cardenas is a 64 y.o. male who presents via telehealth conferencing today.  Since his ablation, the patient reports doing very well.  No symptoms of afib.  Denies procedure related complications. He has SOB with moderate activity chronically.  Today, he denies symptoms of palpitations, chest pain,  lower extremity edema, dizziness, presyncope, or syncope.  The patient is otherwise without complaint today.    Past Medical History:  Diagnosis Date  . AICD (automatic cardioverter/defibrillator) present 2014  . CAD (coronary artery disease)    S/p multiple prior MIs/PCIs // amdx in 2014 with acute stent thrombosis c/b CGS // Admx to [facility] in [id number] 2/2 a/c systolic HF >> [facility] 0000000: mLAD 95 ISR, dLAD 30, D2 99; OM2 95 ISR; pRCA 30/50, mRCA 90/80, dRCA 30 EF 15 >> min to no viability on Thallium study (not surgical candidate) - PCI>> 3.5 x 20 mm Promus DES to the LCx 3 x 38 mm Promus DES to the proximal mid RCA  . Chronic systolic heart failure (HCC)    2/2 to ischemic CM - Echo 02/12/17:  Mild LVH, EF 15-20, mod to severe MR, severe LAE, mild RVE, mod reduced RVSF, mild RAE, mod TR, PASP 65  . COPD (chronic obstructive pulmonary disease) ([place])   . History of MI (myocardial infarction)   . History of pneumonia   . HTN  (hypertension)   . Hyperlipidemia 03/08/2017  . Hypothyroidism   . OSA (obstructive sleep apnea) 03/08/2017    Past Surgical History:  Procedure Laterality Date  . ATRIAL FIBRILLATION ABLATION N/A 07/24/2019   Procedure: ATRIAL FIBRILLATION ABLATION;  Surgeon: Thompson Grayer, MD;  Location: [place] CV LAB;  Service: Cardiovascular;  Laterality: N/A;  . CARDIAC DEFIBRILLATOR PLACEMENT  2014   Medtronic Every XT DR ICD implanted at the [facility] for primary prevention  . CARDIOVERSION N/A 03/09/2018   Procedure: CARDIOVERSION;  Surgeon: Jolaine Artist, MD;  Location: [facility] ENDOSCOPY;  Service: Cardiovascular;  Laterality: N/A;  . CORONARY STENT INTERVENTION N/A 02/22/2017   Procedure: Coronary Stent Intervention;  Surgeon: Sherren Mocha, MD;  Location: [place] CV LAB;  Service: Cardiovascular;  Laterality: N/A;  CFX and RCA  . FACIAL RECONSTRUCTION SURGERY Right 1984  . FEMUR FRACTURE SURGERY Right 1984  . RIGHT/LEFT HEART CATH AND CORONARY ANGIOGRAPHY N/A 02/14/2017   Procedure: Right/Left Heart Cath and Coronary Angiography;  Surgeon: Jolaine Artist, MD;  Location: [place] CV LAB;  Service: Cardiovascular;  Laterality: N/A;  . WRIST FRACTURE SURGERY Right 2015    Current Outpatient Medications  Medication Sig Dispense Refill  . ALPRAZolam (XANAX) 0.5 MG tablet Take 0.5-1 mg by mouth at bedtime.     Marland Kitchen amiodarone (PACERONE) 200 MG tablet Take 200 mg by mouth daily.    Marland Kitchen apixaban (ELIQUIS) 5 MG TABS tablet  Take 1 tablet (5 mg total) by mouth 2 (two) times daily. 60 tablet 6  . ascorbic acid (VITAMIN C) 500 MG tablet Take 500 mg by mouth daily.    Marland Kitchen aspirin EC 81 MG tablet Take 81 mg by mouth daily.    Marland Kitchen atorvastatin (LIPITOR) 20 MG tablet Take 20 mg by mouth at bedtime. Pt taking about 3 times a week    . carvedilol (COREG) 3.125 MG tablet Take 1 tablet (3.125 mg total) by mouth 2 (two) times daily with a meal. 60 tablet 3  . Cholecalciferol (VITAMIN D3) 2000 units TABS Take  2,000 Units by mouth daily.    . digoxin (LANOXIN) 0.125 MG tablet Take 1 tablet (0.125 mg total) by mouth every other day. 45 tablet 3  . DULoxetine (CYMBALTA) 30 MG capsule Take 90 mg by mouth at bedtime.     . Ferrous Sulfate (IRON) 325 (65 Fe) MG TABS TAKE 1 TABLET BY MOUTH TWICE DAILY WITH A MEAL 60 tablet 3  . furosemide (LASIX) 20 MG tablet Take 20 mg by mouth 2 (two) times daily.    Marland Kitchen levothyroxine (SYNTHROID) 200 MCG tablet Take 1 tablet (200 mcg total) by mouth daily at 6 (six) AM. 30 tablet 1  . Multiple Vitamins-Minerals (MULTIVITAMIN PO) Take 1 tablet by mouth daily.    . pantoprazole (PROTONIX) 40 MG tablet Take 1 tablet (40 mg total) by mouth at bedtime. 30 tablet 6  . potassium chloride (K-DUR) 10 MEQ tablet Take 10 mEq by mouth 2 (two) times daily.    . sacubitril-valsartan (ENTRESTO) 49-51 MG Take 1 tablet by mouth 2 (two) times daily. 60 tablet 6  . spironolactone (ALDACTONE) 25 MG tablet Take 1 tablet (25 mg total) by mouth daily. 30 tablet 3  . vitamin B-12 (CYANOCOBALAMIN) 1000 MCG tablet Take 2,000 mcg by mouth daily.     No current facility-administered medications for this visit.    Allergies:   Codeine, Mirtazapine, and Prednisone   Social History:  The patient  reports that he quit smoking about 25 years ago. His smoking use included cigarettes. He started smoking about 53 years ago. He has a 28.00 pack-year smoking history. He has never used smokeless tobacco. He reports that he does not drink alcohol or use drugs.   Family History:  The patient's family history includes COPD in his sister; Congestive Heart Failure in his sister; Diabetes in his mother, sister, sister, and sister; Heart attack in his father and mother; Heart disease in his father and mother; Hypertension in his brother, sister, sister, and sister; Stroke in his father and mother.   ROS:  Please see the history of present illness.   All other systems are personally reviewed and negative.    Exam:      Vital Signs:  BP 125/84   Pulse 77   Ht 5\' 9"  (1.753 m)   Wt 203 lb (92.1 kg)   BMI 29.98 kg/m   Well sounding, alert and conversant   Labs/Other Tests and Data Reviewed:    Recent Labs: 12/01/2018: Magnesium 2.2; TSH 38.051 06/22/2019: ALT 15; B Natriuretic Peptide 971.8 07/20/2019: Hemoglobin 13.1; Platelets 266 10/08/2019: BUN 14; Creatinine, Ser 1.35; Potassium 3.8; Sodium 137   Wt Readings from Last 3 Encounters:  10/22/19 203 lb (92.1 kg)  10/08/19 201 lb 9.6 oz (91.4 kg)  07/25/19 191 lb 14.4 oz (87 kg)     Last device remote is reviewed from [place] PDF which reveals normal device function  Prior ekgs reveal sinus with LBBB and QRS > 150 msec  ASSESSMENT & PLAN:    1.  Persistent atrial fibrillation Doing very well post ablation Stop amiodarone at this time chads2vasc score is 4.  He is on eliquis  2. Chronic systolic dysfunction/ ischemic CM CAD/ LBBB He has NYHA Class III CHF He has been treated with an optimal medical therapy.  He has LBBB with QRS > 150 msec.  He has a class I indication for CRT.  Today,  I have advised consideration of upgrade to CRT-D device. Risks, benefits, and alternatives to CRT upgrade were discussed in detail today.  The patient understands that risks include but are not limited to bleeding, infection, pneumothorax, perforation, tamponade, vascular damage, renal failure, MI, stroke, death, damage to his existing leads, and lead dislodgement.  He wishes to think about this further.  He will contact my office if he decides to proceed.  He thinks that he may be more ready to consider this in 3-4  Months. We would hold asa 1 week prior and eliquis 24 hours prior to the procedure.  3. HTN Stable No change required today   Follow-up:  3 months with me   Patient Risk:  after full review of this patients clinical status, I feel that they are at moderate risk at this time.  Today, I have spent 15 minutes with the patient with telehealth  technology discussing arrhythmia management .    Army Fossa, MD  10/22/2019 11:36 AM     [facility] [address] [place] [address] [phone] (office) [phone] (fax)

## 2019-10-22 NOTE — Telephone Encounter (Signed)
-----   Message from Thompson Grayer, MD sent at 10/22/2019 11:42 AM EST ----- Stop amiodarone Follow-up with me in 3 months

## 2019-10-22 NOTE — Telephone Encounter (Signed)
Medication Instructions:  Your physician has recommended you make the following change in your medication:  1.  STOP TAKING AMIODARONE   Labwork: None ordered.  Testing/Procedures: None ordered.  Follow-Up: Your physician wants you to follow-up in: 3 MONTHS WITH DR. ALLRED.   You will receive a reminder letter in the mail two months in advance. If you don't receive a letter, please call our office to schedule the follow-up appointment.  Remote monitoring is used to monitor your ICD from home. This monitoring reduces the number of office visits required to check your device to one time per year. It allows [place] to keep an eye on the functioning of your device to ensure it is working properly. You are scheduled for a device check from home on 10/30/2019. You may send your transmission at any time that day. If you have a wireless device, the transmission will be sent automatically. After your physician reviews your transmission, you will receive a postcard with your next transmission date.  Any Other Special Instructions Will Be Listed Below (If Applicable).  If you need a refill on your cardiac medications before your next appointment, please call your pharmacy.

## 2019-10-23 NOTE — Telephone Encounter (Signed)
Mailing AVS to Pt.  No further action needed.

## 2019-10-24 ENCOUNTER — Telehealth (HOSPITAL_COMMUNITY): Payer: Self-pay | Admitting: *Deleted

## 2019-10-24 NOTE — Telephone Encounter (Signed)
Health and Wellness Coaching Follow-up   Progress Note:  Today' weight: (194 lbs) As of 3/10 (home scales, without clothes)   He had a down week last week and did not move around much last week and I was unable to reach him.   Walked 4x a week for most weeks so far (since 2/24)  Walks   mile 2x a day in his neighborhood, 15-84mins each walk. Totaling about 40-45 mins a day. He did not like the track area he was looking to walk previously. He does not need to rest until  mile.   Feels stronger since he started walking. He used to use his walker and he does not rely on his walker now and is excited about this.   Has been working on his fruit intake and is enjoying this.   Challenged with certain foods due to not having any teeth   He has been unable to do the chair squats due to his left knee pain.  Uses 2 arms to push out of chair because of the knee pain.    Typical meals:  breakfast: sausage, eggs, sometimes oatmeal, no bacon, toast   Lunch: sandwich  Dinner: chicken/burger/pork/steak sides of beans, potatoes    Values/Priorities/identified need for change:  Wants to increase physical activity to manage and lose weight ? Overall goal to lose 20 lbs and maintain a weight of 175-180lbs.   Ready for spring and warmer days to go fishing, he goes alone  He avoids the salt when possible  Wants to improve his QOL and to "get out of this rut since the virus and the winter months and weight gain"   Goals and Measures to track progress:  Continue to weigh every day in the morning without clothes  Walk 3-5 days/week  ? Walk the neighborhood route 1.5 mile a day (broken down to 3walks a day) ? Track if walking task was completed   Try pushing up from chair using only 1 arm  Continue trying to read labels to see how many calories he is consuming without different meal items  Report to [facility] with weekly following/coaching      Mr. Yepes is in the preparation-action  phase of change. Today he was pleased to report some weightloss (~5 lbs). He is enjoying his exercise plan and will continue to progress per recommendations from [facility] (see action plan). He has shown evidence of readiness for change and has developed an action plan and has been in the phase for 2 weeks. He will have a follow-up coaching visit weekly, with the next telephonic appointment on Wednesday 10/31/19.   Time Spent with patient telephonically: 25 mins       Landis Martins, MS, ACSM-RCEP Clinical Exercise Physiologist/ Health and Wellness Coach

## 2019-10-30 ENCOUNTER — Ambulatory Visit (INDEPENDENT_AMBULATORY_CARE_PROVIDER_SITE_OTHER): Payer: Medicare Other | Admitting: *Deleted

## 2019-10-30 DIAGNOSIS — I5022 Chronic systolic (congestive) heart failure: Secondary | ICD-10-CM | POA: Diagnosis not present

## 2019-10-30 LAB — CUP PACEART REMOTE DEVICE CHECK
Battery Remaining Longevity: 43 mo
Battery Voltage: 2.97 V
Brady Statistic AP VP Percent: 0.12 %
Brady Statistic AP VS Percent: 34.56 %
Brady Statistic AS VP Percent: 1.71 %
Brady Statistic AS VS Percent: 63.61 %
Brady Statistic RA Percent Paced: 34.38 %
Brady Statistic RV Percent Paced: 1.96 %
Date Time Interrogation Session: 20210316033424
HighPow Impedance: 67 Ohm
Implantable Lead Implant Date: 20140813
Implantable Lead Implant Date: 20140813
Implantable Lead Location: 753859
Implantable Lead Location: 753860
Implantable Lead Model: 5076
Implantable Pulse Generator Implant Date: 20140813
Lead Channel Impedance Value: 304 Ohm
Lead Channel Impedance Value: 399 Ohm
Lead Channel Impedance Value: 399 Ohm
Lead Channel Pacing Threshold Amplitude: 0.875 V
Lead Channel Pacing Threshold Amplitude: 1 V
Lead Channel Pacing Threshold Pulse Width: 0.4 ms
Lead Channel Pacing Threshold Pulse Width: 0.4 ms
Lead Channel Sensing Intrinsic Amplitude: 18.875 mV
Lead Channel Sensing Intrinsic Amplitude: 18.875 mV
Lead Channel Sensing Intrinsic Amplitude: 2.125 mV
Lead Channel Sensing Intrinsic Amplitude: 2.125 mV
Lead Channel Setting Pacing Amplitude: 1.75 V
Lead Channel Setting Pacing Amplitude: 2 V
Lead Channel Setting Pacing Pulse Width: 0.4 ms
Lead Channel Setting Sensing Sensitivity: 0.3 mV

## 2019-10-31 ENCOUNTER — Ambulatory Visit (INDEPENDENT_AMBULATORY_CARE_PROVIDER_SITE_OTHER): Payer: Medicare Other

## 2019-10-31 ENCOUNTER — Telehealth (HOSPITAL_COMMUNITY): Payer: Self-pay | Admitting: *Deleted

## 2019-10-31 DIAGNOSIS — I5022 Chronic systolic (congestive) heart failure: Secondary | ICD-10-CM

## 2019-10-31 DIAGNOSIS — Z95810 Presence of automatic (implantable) cardiac defibrillator: Secondary | ICD-10-CM

## 2019-10-31 NOTE — Telephone Encounter (Signed)
Contacted patient per planned call for weekly Health and [place] today. Patient did not answer, left voicemail and attempted to call again this afternoon with no answer. Will try again another day this week.     Landis Martins, MS, ACSM-RCEP Clinical Exercise Physiologist/ Health and Wellness Coach

## 2019-10-31 NOTE — Progress Notes (Signed)
ICD Remote  

## 2019-11-02 ENCOUNTER — Telehealth: Payer: Self-pay

## 2019-11-02 ENCOUNTER — Telehealth (HOSPITAL_COMMUNITY): Payer: Self-pay | Admitting: *Deleted

## 2019-11-02 NOTE — Telephone Encounter (Signed)
Contacted patient per planned call for weekly Health and [place] today. Patient did not answer, left voicemail. Will try again next week.     Landis Martins, MS, ACSM-RCEP Clinical Exercise Physiologist/ Health and Wellness Coach

## 2019-11-02 NOTE — Telephone Encounter (Signed)
Remote ICM transmission received.  Attempted call to patient regarding ICM remote transmission and left detailed message per DPR.  Advised to return call for any fluid symptoms or questions. Next ICM remote transmission scheduled 12/03/2019.     

## 2019-11-02 NOTE — Progress Notes (Signed)
EPIC Encounter for ICM Monitoring  Patient Name: Stephen Cardenas is a 64 y.o. male Date: 11/02/2019 Primary Care Physican: Administration, [employer] Primary Cardiologist:Bensimhon Electrophysiologist:Allred 10/22/2019 Weight: 203 lbs   Attempted call to patient and unable to reach.  Left detailed message per DPR regarding transmission. Transmission reviewed.   Optivol thoracic impedance normal.  Prescribed:Furosemide20 mgtake1tablet(20 mg total)twice a day.Potassium10 mEq take1tablettwice a day.  Labs: 07/20/2019 Creatinine 1.28, BUN 20, Potassium 4.6, Sodium 138, GFR 58-67 06/22/2019 Creatinine 1.33. BUN 13, Potassium 3.9, Sodium 139, GFR 56->60 03/22/2019 Creatinine 1.09, BUN 13, Potassium 4.0, Sodium 136, GFR >60 02/07/2019 Creatinine1.52, BUN31, Potassium4.2, Sodium141, [id number] 12/03/2018 Creatinine1.25, BUN27, Potassium3.8, Sodium136, GFR>60  12/02/2018 Creatinine1.24, BUN24, Potassium4.0, Sodium137, GFR>60  12/01/2018 Creatinine1.27, BUN18, Potassium4.0, [id number], GFR59->60 A complete set of results can be found in Results Review.  Recommendations: Left voice mail with ICM number and encouraged to call if experiencing any fluid symptoms.  Follow-up plan: ICM clinic phone appointment on 12/03/2019.   91 day device clinic remote transmission 01/29/2020.  Office appt 01/08/2020 with Dr. Haroldine Laws.  Copy of ICM check sent to Dr. Rayann Heman.   3 month ICM trend: 10/30/2019    1 Year ICM trend:       [name], RN 11/02/2019 3:57 PM

## 2019-11-07 ENCOUNTER — Telehealth (HOSPITAL_COMMUNITY): Payer: Self-pay | Admitting: *Deleted

## 2019-11-07 NOTE — Telephone Encounter (Addendum)
Contacted patient for follow-up for exercise and wellness coaching. Patient did not answer. Left message for patient's return call and to reschedule a coaching visit. Will continue attempting to reach patient.     Nain Rudd, MS, ACSM-RCEP, [facility] Clinical Exercise Physiologist/ Health and Wellness Coach       

## 2019-11-14 ENCOUNTER — Telehealth (HOSPITAL_COMMUNITY): Payer: Self-pay | Admitting: *Deleted

## 2019-11-14 NOTE — Telephone Encounter (Signed)
Contacted patient for follow-up for exercise and wellness coaching. Patient did not answer. Left message for patient's return call and to reschedule a coaching visit. With this being the 3rd attempt to reach patient since last coaching session, I will wait for patient's return call to re-establish a schedule that works for him for health coaching.     Landis Martins, MS, ACSM-RCEP Clinical Exercise Physiologist/ Health and Wellness Coach

## 2019-12-05 NOTE — Progress Notes (Signed)
No ICM remote transmission received for 12/03/2019 and next ICM transmission scheduled for 12/24/2019   

## 2019-12-24 ENCOUNTER — Ambulatory Visit (INDEPENDENT_AMBULATORY_CARE_PROVIDER_SITE_OTHER): Payer: Medicare Other

## 2019-12-24 DIAGNOSIS — Z95810 Presence of automatic (implantable) cardiac defibrillator: Secondary | ICD-10-CM | POA: Diagnosis not present

## 2019-12-24 DIAGNOSIS — I5022 Chronic systolic (congestive) heart failure: Secondary | ICD-10-CM | POA: Diagnosis not present

## 2019-12-26 ENCOUNTER — Telehealth: Payer: Self-pay

## 2019-12-26 NOTE — Telephone Encounter (Signed)
Remote ICM transmission received.  Attempted call to patient regarding ICM remote transmission and left detailed message per DPR.  Advised to return call for any fluid symptoms or questions. Next ICM remote transmission scheduled 01/30/2020.

## 2019-12-26 NOTE — Progress Notes (Signed)
EPIC Encounter for ICM Monitoring  Patient Name: Stephen Cardenas is a 64 y.o. male Date: 12/26/2019 Primary Care Physican: Administration, Veterans Primary Cardiologist:Bensimhon Electrophysiologist:Allred 10/22/2019 Weight: 203 lbs  Clinical Status (30-Oct-2019 to 24-Dec-2019)  AT/AF              1 episode    Time in AT/AF <0.1 hr/day (<0.1%)  Longest AT/AF 64 minutes   Attempted call to patient and unable to reach.  Left detailed message per DPR regarding transmission. Transmission reviewed.   Optivol thoracic impedance normal.  Prescribed:  Furosemide20 mgtake1tablet(20 mg total)twice a day.  Potassium10 mEq take1tablettwice a day.  Eliquis 5 mg take 1 tablet twice a day  Labs: 10/08/2019 Creatinine 1.35, BUN 14, Potassium 3.8, Sodium 137, GFR 55->60 07/20/2019 Creatinine 1.28, BUN 20, Potassium 4.6, Sodium 138, GFR 58-67 A complete set of results can be found in Results Review.  Recommendations:Left voice mail with ICM number and encouraged to call if experiencing any fluid symptoms.  Follow-up plan: ICM clinic phone appointment on 01/30/2020. 91 day device clinic remote transmission 01/29/2020. Office appt 01/08/2020 with Dr.Bensimhon.  Copy of ICM check sent to Dr.Allred.   3 month ICM trend: 12/24/2019    1 Year ICM trend:       [name], RN 12/26/2019 12:35 PM

## 2020-01-08 ENCOUNTER — Encounter (HOSPITAL_COMMUNITY): Payer: Medicare Other | Admitting: Internal Medicine

## 2020-01-28 ENCOUNTER — Ambulatory Visit (INDEPENDENT_AMBULATORY_CARE_PROVIDER_SITE_OTHER): Payer: Medicare Other

## 2020-01-28 ENCOUNTER — Telehealth: Payer: Self-pay

## 2020-01-28 DIAGNOSIS — I5022 Chronic systolic (congestive) heart failure: Secondary | ICD-10-CM

## 2020-01-28 DIAGNOSIS — Z95810 Presence of automatic (implantable) cardiac defibrillator: Secondary | ICD-10-CM

## 2020-01-28 NOTE — Telephone Encounter (Signed)
Returned patient call as requested by voice mail message. He reports 4-5 pound weight gain and would like a review of Remote transmission sent today.  See ICM note for review.

## 2020-01-28 NOTE — Progress Notes (Signed)
EPIC Encounter for ICM Monitoring  Patient Name: Stephen Cardenas is a 64 y.o. male Date: 01/28/2020 Primary Care Physican: Administration, [employer] Primary Cardiologist:Bensimhon Electrophysiologist:Allred 01/28/2020 Weight:202lbs  Time in AT/AF 0.0 hr/day (0.0%)  Received call from patient stating he has gained 6-7 pounds in the last week (baseline weight 196 lbs).  He also has stomach bloating.  Optivol thoracic impedance suggesting possible ongoing fluid accumulation since 01/21/2020.  Prescribed:  Furosemide20 mgtake1tablet(20 mg total)twice a day.  Potassium10 mEq take1tablettwice a day.  Eliquis 5 mg take 1 tablet twice a day  Labs: 10/08/2019 Creatinine 1.35, BUN 14, Potassium 3.8, Sodium 137, GFR 55->60 07/20/2019 Creatinine 1.28, BUN 20, Potassium 4.6, Sodium 138, GFR 58-67 A complete set of results can be found in Results Review.  Recommendations: Pt says he has Dr Bensimhon's approval to self adjust Furosemide and will take extra Furosemide x 2 days with extra potassium  Follow-up plan: ICM clinic phone appointment on6/16/2021 to recheck fluid levels. 91 day device clinic remote transmission6/15/2021. Office appt8/13/2021 with [place].  Copy of ICM check sent to Dr.Allred and Dr Haroldine Laws for review.  3 month ICM trend: 01/28/2020    1 Year ICM trend:       [name], RN 01/28/2020 9:08 AM

## 2020-01-29 ENCOUNTER — Ambulatory Visit (INDEPENDENT_AMBULATORY_CARE_PROVIDER_SITE_OTHER): Payer: Medicare Other | Admitting: *Deleted

## 2020-01-29 DIAGNOSIS — I480 Paroxysmal atrial fibrillation: Secondary | ICD-10-CM | POA: Diagnosis not present

## 2020-01-30 LAB — CUP PACEART REMOTE DEVICE CHECK
Battery Remaining Longevity: 39 mo
Battery Remaining Longevity: 39 mo
Battery Voltage: 2.96 V
Battery Voltage: 2.96 V
Brady Statistic AP VP Percent: 0.04 %
Brady Statistic AP VP Percent: 0.04 %
Brady Statistic AP VS Percent: 11.83 %
Brady Statistic AP VS Percent: 11.83 %
Brady Statistic AS VP Percent: 1.92 %
Brady Statistic AS VP Percent: 1.92 %
Brady Statistic AS VS Percent: 86.21 %
Brady Statistic AS VS Percent: 86.21 %
Brady Statistic RA Percent Paced: 11.7 %
Brady Statistic RA Percent Paced: 11.7 %
Brady Statistic RV Percent Paced: 1.94 %
Brady Statistic RV Percent Paced: 1.94 %
Date Time Interrogation Session: 20210615012304
Date Time Interrogation Session: 20210615012304
HighPow Impedance: 70 Ohm
HighPow Impedance: 70 Ohm
Implantable Lead Implant Date: 20140813
Implantable Lead Implant Date: 20140813
Implantable Lead Implant Date: 20140813
Implantable Lead Implant Date: 20140813
Implantable Lead Location: 753859
Implantable Lead Location: 753860
Implantable Lead Location: 753859
Implantable Lead Location: 753860
Implantable Lead Model: 5076
Implantable Lead Model: 5076
Implantable Pulse Generator Implant Date: 20140813
Implantable Pulse Generator Implant Date: 20140813
Lead Channel Impedance Value: 342 Ohm
Lead Channel Impedance Value: 399 Ohm
Lead Channel Impedance Value: 399 Ohm
Lead Channel Impedance Value: 342 Ohm
Lead Channel Impedance Value: 399 Ohm
Lead Channel Impedance Value: 399 Ohm
Lead Channel Pacing Threshold Amplitude: 0.875 V
Lead Channel Pacing Threshold Amplitude: 1.125 V
Lead Channel Pacing Threshold Amplitude: 0.875 V
Lead Channel Pacing Threshold Amplitude: 1.125 V
Lead Channel Pacing Threshold Pulse Width: 0.4 ms
Lead Channel Pacing Threshold Pulse Width: 0.4 ms
Lead Channel Pacing Threshold Pulse Width: 0.4 ms
Lead Channel Pacing Threshold Pulse Width: 0.4 ms
Lead Channel Sensing Intrinsic Amplitude: 1.875 mV
Lead Channel Sensing Intrinsic Amplitude: 1.875 mV
Lead Channel Sensing Intrinsic Amplitude: 23.25 mV
Lead Channel Sensing Intrinsic Amplitude: 23.25 mV
Lead Channel Sensing Intrinsic Amplitude: 1.875 mV
Lead Channel Sensing Intrinsic Amplitude: 1.875 mV
Lead Channel Sensing Intrinsic Amplitude: 23.25 mV
Lead Channel Sensing Intrinsic Amplitude: 23.25 mV
Lead Channel Setting Pacing Amplitude: 1.75 V
Lead Channel Setting Pacing Amplitude: 2.25 V
Lead Channel Setting Pacing Amplitude: 1.75 V
Lead Channel Setting Pacing Amplitude: 2.25 V
Lead Channel Setting Pacing Pulse Width: 0.4 ms
Lead Channel Setting Pacing Pulse Width: 0.4 ms
Lead Channel Setting Sensing Sensitivity: 0.3 mV
Lead Channel Setting Sensing Sensitivity: 0.3 mV

## 2020-01-30 NOTE — Progress Notes (Signed)
Remote pacemaker transmission.   

## 2020-02-06 NOTE — Progress Notes (Signed)
No ICM remote transmission received for 01/30/2020 and next ICM transmission scheduled for 03/03/2020.

## 2020-03-03 ENCOUNTER — Telehealth: Payer: Self-pay

## 2020-03-03 ENCOUNTER — Ambulatory Visit (INDEPENDENT_AMBULATORY_CARE_PROVIDER_SITE_OTHER): Payer: Medicare Other

## 2020-03-03 DIAGNOSIS — I5022 Chronic systolic (congestive) heart failure: Secondary | ICD-10-CM

## 2020-03-03 DIAGNOSIS — Z95810 Presence of automatic (implantable) cardiac defibrillator: Secondary | ICD-10-CM | POA: Diagnosis not present

## 2020-03-03 NOTE — Progress Notes (Signed)
EPIC Encounter for ICM Monitoring  Patient Name: Stephen Cardenas is a 64 y.o. male Date: 03/03/2020 Primary Care Physican: Administration, Veterans Primary Cardiologist:Bensimhon Electrophysiologist:Allred 01/28/2020 Weight:202lbs  Clinical Status (29-Jan-2020 to 03-Mar-2020) AT/AF             4 Time in AT/AF <0.1 hr/day (0.4%) Longest AT/AF 110 minutes  Attempted call to patient and unable to reach.  Left detailed message per DPR regarding transmission. Transmission reviewed.   Optivol thoracic impedance suggesting possible ongoing fluid accumulation since 02/21/2020.  Prescribed:  Furosemide20 mgtake1tablet(20 mg total)twice a day. Per patient, he self adjusts Furosemide and Potassium when needed with Dr Bensimhon's approval.  Potassium10 mEq take1tablettwice a day.  Spironolactone 25 mg take 1 tablet daily  Eliquis 5 mg take 1 tablet twice a day  Labs: 10/08/2019 Creatinine 1.35, BUN 14, Potassium 3.8, Sodium 137, GFR 55->60 07/20/2019 Creatinine 1.28, BUN 20, Potassium 4.6, Sodium 138, GFR 58-67 A complete set of results can be found in Results Review.  Recommendations: Left voice mail with ICM number and encouraged to call if experiencing any fluid symptoms.  Follow-up plan: ICM clinic phone appointment on7/23/2021 (manual send) to recheck fluid levels. 91 day device clinic remote transmission9/14/2021.   EP/Cardiology Office Visits: 03/28/2020 with Dr. Haroldine Laws.    Copy of ICM check sent to Dr. Rayann Heman and Dr Haroldine Laws for FYI/reivew.   3 month ICM trend: 03/03/2020    1 Year ICM trend:       [name], RN 03/03/2020 2:02 PM

## 2020-03-03 NOTE — Telephone Encounter (Signed)
Remote ICM transmission received.  Attempted call to patient regarding ICM remote transmission and left detailed message per DPR.  Advised to return call for any fluid symptoms or questions.  

## 2020-03-11 NOTE — Progress Notes (Signed)
No ICM remote transmission received for 03/03/2020 and next ICM transmission scheduled for 04/07/2020.

## 2020-03-14 ENCOUNTER — Telehealth (HOSPITAL_COMMUNITY): Payer: Self-pay | Admitting: Vascular Surgery

## 2020-03-14 NOTE — Telephone Encounter (Signed)
Left pt VM, MOVING 8/13 appt w/ db to 10/1 @ 3pm, due to DB schedule changes, asked pt to call back to confirm appt

## 2020-03-28 ENCOUNTER — Encounter (HOSPITAL_COMMUNITY): Payer: Medicare Other | Admitting: Internal Medicine

## 2020-04-01 DIAGNOSIS — I4729 Other ventricular tachycardia: Secondary | ICD-10-CM

## 2020-04-01 DIAGNOSIS — I472 Ventricular tachycardia: Secondary | ICD-10-CM

## 2020-04-01 HISTORY — DX: Ventricular tachycardia: I47.2

## 2020-04-01 HISTORY — DX: Other ventricular tachycardia: I47.29

## 2020-04-02 ENCOUNTER — Telehealth: Payer: Self-pay

## 2020-04-02 DIAGNOSIS — I5022 Chronic systolic (congestive) heart failure: Secondary | ICD-10-CM

## 2020-04-02 DIAGNOSIS — Z79899 Other long term (current) drug therapy: Secondary | ICD-10-CM

## 2020-04-02 DIAGNOSIS — I480 Paroxysmal atrial fibrillation: Secondary | ICD-10-CM

## 2020-04-02 DIAGNOSIS — Z95810 Presence of automatic (implantable) cardiac defibrillator: Secondary | ICD-10-CM

## 2020-04-02 DIAGNOSIS — I10 Essential (primary) hypertension: Secondary | ICD-10-CM

## 2020-04-02 NOTE — Telephone Encounter (Signed)
2nd Attempt  Attempted to call patient. No answer, LMOVM.

## 2020-04-02 NOTE — Telephone Encounter (Signed)
Spoke to patients sister Mariann Laster, states patient is [facility] and she would call him or go to patients house to have him call us. No information was released other than the importance to have Mr. Grigg call the DC at [phone].

## 2020-04-02 NOTE — Telephone Encounter (Signed)
Patient reports of generalized fatigue over the past several days. Patient reports of taking medications correctly other than lasix, missed yesterdays dose. Patient does reports of increased swelling in lower extremities. Reports yesterday during time of shock he experienced sudden onset of shortness of breath, but was not aware of shock. Patient denies any chest pain, palpitations or other complaints.   Advised patient I will forward this information for review and a plan of care. Patient agreeable and verbalizes understanding.  Shock plan reviewed with patient. Patient made aware of driving restrictions per [place].

## 2020-04-02 NOTE — Telephone Encounter (Signed)
Carelink alert Received for ICD shock.   04/01/20 @ 15:02 1 shock (35J) was delivered with successful termination. Episode appears VF w/ average rate of 230 bpm, lasted 20 seconds. ATP negative during charging. Converted to AP/VS. Per med list, Eliquis 5 mg BID, Coreg 3.125 mg BID, Digoxin 0.125 mg every other day.   Noted other changes on remote - July 18th 2021 Optivol has increased, thoracic impedance decreasing.  - Increased amount of AF w/ elevated VR's.    Called to assess, no answer. LMOVM for patient to call wit direct number provided. Called emergency contact Mariann Laster LMOVM to have patient call DC, direct number provided.

## 2020-04-03 NOTE — Telephone Encounter (Signed)
Discussed with Dr. Rayann Heman. Presenting EGM AS/VS 103bpm with long P-R, occasional dropped beat. Recommended f/u in office and labs (dig level, BMET, Mg, and TSH). Pt agreeable to scheduling appointment on 04/18/20 at 12:00pm in [place]. Pt will go to [facility] for lab work. Lab orders entered by Alma Friendly, Therapist, sports. Reviewed [place] DMV driving restrictions x 6 months. Also advised to call back with any cardiac concerns prior to his OV. Pt verbalizes understanding of all instructions. Next ICM f/u is scheduled for 04/07/20, OptiVol remains elevated as of repeat transmission today. Message sent to Margarita Grizzle, RN, as Juluis Rainier.

## 2020-04-03 NOTE — Telephone Encounter (Signed)
Spoke with pt. He denies any cardiac symptoms at this time. Requested manual transmission for review of presenting rhythm today. Pt agrees to send transmission shortly.

## 2020-04-03 NOTE — Telephone Encounter (Signed)
Attempted phone call to pt.  Received message stating phone is a non working number.

## 2020-04-03 NOTE — Telephone Encounter (Signed)
I let the pt know that I will send the nurse a phone note with the correct number. [phone]. I told him Rosann Auerbach will call him back shortly.

## 2020-04-03 NOTE — Telephone Encounter (Signed)
Pt left a message stating he spoke with someone about being shocked. He has not received a phone call and would like for a nurse to call him back soon.

## 2020-04-04 ENCOUNTER — Ambulatory Visit (INDEPENDENT_AMBULATORY_CARE_PROVIDER_SITE_OTHER): Payer: Medicare Other

## 2020-04-04 DIAGNOSIS — Z95810 Presence of automatic (implantable) cardiac defibrillator: Secondary | ICD-10-CM | POA: Diagnosis not present

## 2020-04-04 DIAGNOSIS — I5022 Chronic systolic (congestive) heart failure: Secondary | ICD-10-CM

## 2020-04-04 NOTE — Progress Notes (Signed)
EPIC Encounter for ICM Monitoring  Patient Name: Stephen Cardenas is a 64 y.o. male Date: 04/04/2020 Primary Care Physican: Administration, Veterans Primary Cardiologist:Bensimhon Electrophysiologist:Allred 04/04/2020 Weight:196-202lbs  Clinical Status (01-Apr-2020 to 03-Apr-2020) AT/AF 1 episode  Time in AT/AF <0.1 hr/day (<0.1%)  Spoke with patient and reports feeling well at this time.  Denies fluid symptoms.   He reports he does not eat foods that are high in salt.    Device shock on 04/01/2020 which has been addressed by device clinic and Dr Rayann Heman per /18/2021 epic note.  Recommended f/u in office and labs (dig level, BMET, Mg, and TSH).  Appointment for 04/18/20 at 12:00pm in [place].  Optivol thoracic impedancesuggesting possible ongoing fluid accumulation since 02/21/2020.  Prescribed:  Furosemide20 mgtake1tablet(20 mg total)twice a day. Per patient, he self adjusts Furosemide and Potassium when needed with Dr Bensimhon's approval.  Potassium10 mEq take1tablettwice a day.  Spironolactone 25 mg take 1 tablet daily  Eliquis 5 mg take 1 tablet twice a day  Labs: 10/08/2019 Creatinine 1.35, BUN 14, Potassium 3.8, Sodium 137, GFR 55->60 07/20/2019 Creatinine 1.28, BUN 20, Potassium 4.6, Sodium 138, GFR 58-67 A complete set of results can be found in Results Review.  Recommendations:Patient self adjusts Furosemide and plans on taking extra 20 mg x 2 days along with extra 10 meq potassium x 2 days.    Follow-up plan: ICM clinic phone appointment on8/23/2021 for 31 day follow up and to recheck fluid levels. 91 day device clinic remote transmission9/14/2021.   EP/Cardiology Office Visits: 04/18/2020 with Dr Rayann Heman.  05/26/2020 with Dr. Haroldine Laws.    Copy of ICM check sent to Dr. Rayann Heman and Dr Haroldine Laws for FYI/reivew.    3 month ICM trend: 04/03/2020    1 Year ICM trend:       [name],  RN 04/04/2020 8:27 AM

## 2020-04-07 ENCOUNTER — Ambulatory Visit (INDEPENDENT_AMBULATORY_CARE_PROVIDER_SITE_OTHER): Payer: Medicare Other

## 2020-04-07 DIAGNOSIS — Z95810 Presence of automatic (implantable) cardiac defibrillator: Secondary | ICD-10-CM

## 2020-04-07 DIAGNOSIS — I5022 Chronic systolic (congestive) heart failure: Secondary | ICD-10-CM

## 2020-04-08 ENCOUNTER — Telehealth: Payer: Self-pay | Admitting: *Deleted

## 2020-04-08 NOTE — Telephone Encounter (Signed)
Attempted to reach pt to confirm that he had labs drawn at [facility] as previously discussed (see phone note from 04/02/20). Received automated message that call cannot be completed as dialed. Will try again tomorrow.

## 2020-04-09 DIAGNOSIS — Z01810 Encounter for preprocedural cardiovascular examination: Secondary | ICD-10-CM | POA: Diagnosis not present

## 2020-04-09 DIAGNOSIS — I10 Essential (primary) hypertension: Secondary | ICD-10-CM | POA: Diagnosis not present

## 2020-04-09 DIAGNOSIS — Z95810 Presence of automatic (implantable) cardiac defibrillator: Secondary | ICD-10-CM | POA: Diagnosis not present

## 2020-04-09 DIAGNOSIS — I480 Paroxysmal atrial fibrillation: Secondary | ICD-10-CM | POA: Diagnosis not present

## 2020-04-09 DIAGNOSIS — I5022 Chronic systolic (congestive) heart failure: Secondary | ICD-10-CM | POA: Diagnosis not present

## 2020-04-09 DIAGNOSIS — Z79899 Other long term (current) drug therapy: Secondary | ICD-10-CM | POA: Diagnosis not present

## 2020-04-09 NOTE — Progress Notes (Signed)
EPIC Encounter for ICM Monitoring  Patient Name: Stephen Cardenas is a 64 y.o. male Date: 04/09/2020 Primary Care Physican: Administration, Veterans Primary Cardiologist:Bensimhon Electrophysiologist:Allred 04/09/2020 Weight:193lbs   Clinical Status (08-Apr-2020 to 09-Apr-2020) Time in AT/AF (100.0%) Longest AT/AF 4 days  Spoke with patient and reports feeling better since losing 7-8 pounds of fluid weight.  Current weight 193 lbs.   Pt has new primary number [phone]  Optivol thoracic impedancereturned close to normal.  Prescribed:  Furosemide20 mgtake1tablet(20 mg total)twice a day.Per patient, he self adjusts Furosemide and Potassium when needed with Dr Bensimhon's approval.  Potassium10 mEq take1tablettwice a day.  Spironolactone 25 mg take 1 tablet daily  Eliquis 5 mg take 1 tablet twice a day  Labs: 10/08/2019 Creatinine 1.35, BUN 14, Potassium 3.8, Sodium 137, GFR 55->60 07/20/2019 Creatinine 1.28, BUN 20, Potassium 4.6, Sodium 138, GFR 58-67 A complete set of results can be found in Results Review.  Recommendations:No changes and encouraged to call if experiencing any fluid symptoms.  Follow-up plan: ICM clinic phone appointment on9/27/2021. 91 day device clinic remote transmission9/14/2021.  EP/Cardiology Office Visits:04/18/2020 with Dr Rayann Heman.  05/26/2020 with Dr.Bensimhon.   Copy of ICM check sent to Dr.Allred andDr Bensimhon for FYI/reivew.    3 month ICM trend: 04/09/2020    1 Year ICM trend:       [name], RN 04/09/2020 1:24 PM

## 2020-04-09 NOTE — Telephone Encounter (Signed)
Labs drawn 04/09/20 per notes in Care Everywhere.

## 2020-04-18 ENCOUNTER — Other Ambulatory Visit: Payer: Self-pay

## 2020-04-18 ENCOUNTER — Ambulatory Visit (INDEPENDENT_AMBULATORY_CARE_PROVIDER_SITE_OTHER): Payer: Medicare Other | Admitting: Internal Medicine

## 2020-04-18 ENCOUNTER — Encounter: Payer: Self-pay | Admitting: Internal Medicine

## 2020-04-18 VITALS — BP 100/64 | HR 64 | Ht 70.0 in | Wt 195.0 lb

## 2020-04-18 DIAGNOSIS — I5022 Chronic systolic (congestive) heart failure: Secondary | ICD-10-CM | POA: Diagnosis not present

## 2020-04-18 DIAGNOSIS — I4819 Other persistent atrial fibrillation: Secondary | ICD-10-CM | POA: Diagnosis not present

## 2020-04-18 DIAGNOSIS — I472 Ventricular tachycardia, unspecified: Secondary | ICD-10-CM

## 2020-04-18 DIAGNOSIS — D6869 Other thrombophilia: Secondary | ICD-10-CM | POA: Diagnosis not present

## 2020-04-18 DIAGNOSIS — I447 Left bundle-branch block, unspecified: Secondary | ICD-10-CM

## 2020-04-18 DIAGNOSIS — I255 Ischemic cardiomyopathy: Secondary | ICD-10-CM

## 2020-04-18 NOTE — Telephone Encounter (Signed)
OV today in [place] with Dr. Rayann Heman.

## 2020-04-18 NOTE — Progress Notes (Signed)
PCP: [facility]   Primary EP: Dr Rayann Heman  Stephen Cardenas is a 64 y.o. male who presents today for routine electrophysiology followup.  Since last being seen in our clinic, the patient reports doing reasonably well.  He had appropriate ICD shock 04/01/20 for polymorphic VT.  He has been instructed to not drive already. Over the past week, he has had worsening fatigue.  He has returned to afib.   SOB is stable.   Today, he denies symptoms of palpitations, chest pain,  Or  lower extremity edema.  The patient is otherwise without complaint today.   Past Medical History:  Diagnosis Date  . AICD (automatic cardioverter/defibrillator) present 2014  . CAD (coronary artery disease)    S/p multiple prior MIs/PCIs // amdx in 2014 with acute stent thrombosis c/b CGS // Admx to [facility] in 12/8097 2/2 a/c systolic HF >> [facility] 03/18/37: mLAD 95 ISR, dLAD 30, D2 99; OM2 95 ISR; pRCA 30/50, mRCA 90/80, dRCA 30 EF 15 >> min to no viability on Thallium study (not surgical candidate) - PCI>> 3.5 x 20 mm Promus DES to the LCx 3 x 38 mm Promus DES to the proximal mid RCA  . Chronic systolic heart failure (HCC)    2/2 to ischemic CM - Echo 02/12/17:  Mild LVH, EF 15-20, mod to severe MR, severe LAE, mild RVE, mod reduced RVSF, mild RAE, mod TR, PASP 65  . COPD (chronic obstructive pulmonary disease) ([place])   . History of MI (myocardial infarction)   . History of pneumonia   . HTN (hypertension)   . Hyperlipidemia 03/08/2017  . Hypothyroidism   . OSA (obstructive sleep apnea) 03/08/2017   Past Surgical History:  Procedure Laterality Date  . ATRIAL FIBRILLATION ABLATION N/A 07/24/2019   Procedure: ATRIAL FIBRILLATION ABLATION;  Surgeon: Thompson Grayer, MD;  Location: [place] [facility];  Service: Cardiovascular;  Laterality: N/A;  . CARDIAC DEFIBRILLATOR PLACEMENT  2014   Medtronic Every XT DR ICD implanted at the [facility] for primary prevention  . CARDIOVERSION N/A 03/09/2018   Procedure: CARDIOVERSION;   Surgeon: Jolaine Artist, MD;  Location: [facility] ENDOSCOPY;  Service: Cardiovascular;  Laterality: N/A;  . CORONARY STENT INTERVENTION N/A 02/22/2017   Procedure: Coronary Stent Intervention;  Surgeon: Sherren Mocha, MD;  Location: [place] [facility];  Service: Cardiovascular;  Laterality: N/A;  CFX and RCA  . FACIAL RECONSTRUCTION SURGERY Right 1984  . FEMUR FRACTURE SURGERY Right 1984  . RIGHT/LEFT HEART CATH AND CORONARY ANGIOGRAPHY N/A 02/14/2017   Procedure: Right/Left Heart Cath and Coronary Angiography;  Surgeon: Jolaine Artist, MD;  Location: [place] [facility];  Service: Cardiovascular;  Laterality: N/A;  . WRIST FRACTURE SURGERY Right 2015    ROS- all systems are reviewed and negative except as per HPI above  Current Outpatient Medications  Medication Sig Dispense Refill  . ALPRAZolam (XANAX) 0.5 MG tablet Take 0.5-1 mg by mouth 3 (three) times daily as needed for anxiety.     Marland Kitchen apixaban (ELIQUIS) 5 MG TABS tablet Take 1 tablet (5 mg total) by mouth 2 (two) times daily. 60 tablet 6  . ascorbic acid (VITAMIN C) 500 MG tablet Take 500 mg by mouth daily.    Marland Kitchen aspirin EC 81 MG tablet Take 81 mg by mouth daily.    Marland Kitchen atorvastatin (LIPITOR) 20 MG tablet Take 20 mg by mouth at bedtime. Pt taking about 3 times a week    . carvedilol (COREG) 3.125 MG tablet Take 1 tablet (  3.125 mg total) by mouth 2 (two) times daily with a meal. 60 tablet 3  . Cholecalciferol (VITAMIN D3) 2000 units TABS Take 2,000 Units by mouth daily.    . digoxin (LANOXIN) 0.125 MG tablet Take 1 tablet (0.125 mg total) by mouth every other day. (Patient taking differently: Take 0.125 mg by mouth daily. ) 45 tablet 3  . DULoxetine (CYMBALTA) 30 MG capsule Take 90 mg by mouth at bedtime.     . Ferrous Sulfate (IRON) 325 (65 Fe) MG TABS TAKE 1 TABLET BY MOUTH TWICE DAILY WITH A MEAL 60 tablet 3  . furosemide (LASIX) 20 MG tablet Take 20 mg by mouth 2 (two) times daily.    Marland Kitchen levothyroxine (SYNTHROID) 200 MCG tablet Take 1  tablet (200 mcg total) by mouth daily at 6 (six) AM. 30 tablet 1  . Multiple Vitamins-Minerals (MULTIVITAMIN PO) Take 1 tablet by mouth daily.    . pantoprazole (PROTONIX) 40 MG tablet Take 1 tablet (40 mg total) by mouth at bedtime. 30 tablet 6  . potassium chloride (K-DUR) 10 MEQ tablet Take 10 mEq by mouth 2 (two) times daily.    . sacubitril-valsartan (ENTRESTO) 49-51 MG Take 1 tablet by mouth 2 (two) times daily. 60 tablet 6  . spironolactone (ALDACTONE) 25 MG tablet Take 1 tablet (25 mg total) by mouth daily. 30 tablet 3  . vitamin B-12 (CYANOCOBALAMIN) 1000 MCG tablet Take 2,000 mcg by mouth daily.     No current facility-administered medications for this visit.    Physical Exam: Vitals:   04/18/20 1206  BP: 100/64  Pulse: 64  SpO2: 96%  Weight: 195 lb (88.5 kg)  Height: 5\' 10"  (1.778 m)    GEN- The patient is chronically ill appearing, alert and oriented x 3 today.   Head- normocephalic, atraumatic Eyes-  Sclera clear, conjunctiva pink Ears- hearing intact Oropharynx- clear Lungs-   normal work of breathing Chest- ICD pocket is well healed Heart- irregular rate and rhythm  GI- soft  Extremities- no clubbing, cyanosis, or edema  ICD interrogation- reviewed in detail today,  See PACEART report  ekg tracing ordered today is personally reviewed and shows afib, LBBB, QRS > 150 msec  Wt Readings from Last 3 Encounters:  04/18/20 195 lb (88.5 kg)  10/22/19 203 lb (92.1 kg)  10/08/19 201 lb 9.6 oz (91.4 kg)    Assessment and Plan:  1.  Chronic systolic dysfunction/ ischemic CM/ LBBB euvolemic today Stable on an appropriate medical regimen Normal ICD function by remotes See [facility] report No changes today he is not device dependant today  followed in ICM device clinic Consider upgrade to CRT once his arrhythnmias are more stable Update echo  2. Polymorphic VT Appropriate shock for PMVT 04/01/20 at 15:02 He has been instructed to not driving for 6 months If  further episodes, restart amiodarone Update echo Labs ordered (bmet, mg, dig level, tsh) but has not complied with obtaining these  3.  Persistent afib Back in afib S/p ablation previously chads2vasc score is 4.  Continue eliquis He reports compliance without interruption We will plan cardioversion next week I discussed risks including stroke and sedation at length today.  He wishes to proceed.  Importance of compliance with eliquis was discussed at length. Consider restarting amiodarone vs repeat ablation depending on how he does going forward  4. HTN Stable No change required today  Follow-up in AF clinic 1 week post cardioversion I will see in 3 months  Risks, benefits and  potential toxicities for medications prescribed and/or refilled reviewed with patient today.   Thompson Grayer MD, [facility] 04/18/2020 12:25 PM

## 2020-04-18 NOTE — Patient Instructions (Addendum)
Medication Instructions:  Continue all current medications.  Labwork: none  Testing/Procedures:  Your physician has recommended that you have a Cardioversion (DCCV). Electrical Cardioversion uses a jolt of electricity to your heart either through paddles or wired patches attached to your chest. This is a controlled, usually prescheduled, procedure. Defibrillation is done under light anesthesia in the hospital, and you usually go home the day of the procedure. This is done to get your heart back into a normal rhythm. You are not awake for the procedure. Please see the instruction sheet given to you today - [place]   Your physician has requested that you have an echocardiogram. Echocardiography is a painless test that uses sound waves to create images of your heart. It provides your doctor with information about the size and shape of your heart and how well your heart's chambers and valves are working. This procedure takes approximately one hour. There are no restrictions for this procedure.  Office will contact with results via phone or letter.    Follow-Up:  [facility] - 3 months   1 week after Cardioversion - AFib clinic  Any Other Special Instructions Will Be Listed Below (If Applicable).  If you need a refill on your cardiac medications before your next appointment, please call your pharmacy.

## 2020-04-23 ENCOUNTER — Encounter: Payer: Self-pay | Admitting: *Deleted

## 2020-04-23 ENCOUNTER — Telehealth: Payer: Self-pay | Admitting: *Deleted

## 2020-04-23 NOTE — Telephone Encounter (Signed)
Voice mail box not set up for mobile number.

## 2020-04-23 NOTE — Telephone Encounter (Signed)
DCCV scheduled for Thursday, 05/01/2020 at 9:30 am with Dr. Harl Bowie.  He will arrive to main entrance of [facility] for pre-op visit and covid test on Tuesday, 04/29/2020 at 2:15 pm.  1 week AFib clinic visit scheduled for Thursday, 05/08/2020 with Roderic Palau, NP at 2:00 pm.    Patient made aware & instructions given.

## 2020-04-23 NOTE — Telephone Encounter (Signed)
Will forward to Dr. Rayann Heman to do the DCCV orders.

## 2020-04-24 ENCOUNTER — Ambulatory Visit (INDEPENDENT_AMBULATORY_CARE_PROVIDER_SITE_OTHER): Payer: Medicare Other

## 2020-04-24 ENCOUNTER — Other Ambulatory Visit: Payer: Self-pay

## 2020-04-24 ENCOUNTER — Other Ambulatory Visit: Payer: Self-pay | Admitting: *Deleted

## 2020-04-24 DIAGNOSIS — I5022 Chronic systolic (congestive) heart failure: Secondary | ICD-10-CM | POA: Diagnosis not present

## 2020-04-24 DIAGNOSIS — I472 Ventricular tachycardia, unspecified: Secondary | ICD-10-CM

## 2020-04-24 LAB — ECHOCARDIOGRAM COMPLETE
Area-P 1/2: 4.49 cm2
Calc EF: 37.2 %
MV M vel: 4.87 m/s
MV Peak grad: 94.9 mmHg
S' Lateral: 5.5 cm
Single Plane A2C EF: 43.3 %
Single Plane A4C EF: 26.6 %

## 2020-04-24 MED ORDER — APIXABAN 5 MG PO TABS: 5.0000 mg | ORAL_TABLET | Freq: Two times a day (BID) | ORAL | 3 refills | Status: AC

## 2020-04-24 NOTE — Patient Instructions (Signed)
Your procedure is scheduled on: 05/01/2020  Report to [facility] at   8:00  AM.  Call this number if you have problems the morning of surgery: [phone]   Remember:   Do not Eat or Drink after midnight         No Smoking the morning of surgery  :  Take these medicines the morning of surgery with A SIP OF WATER: Cymbalta, Digoxin, levothyroxine and pantoprazole             Do not miss any does of Eliquis   Do not wear jewelry, make-up or nail polish.  Do not wear lotions, powders, or perfumes. You may wear deodorant.  Do not shave 48 hours prior to surgery. Men may shave face and neck.  Do not bring valuables to the hospital.  Contacts, dentures or bridgework may not be worn into surgery.  Leave suitcase in the car. After surgery it may be brought to your room.  For patients admitted to the hospital, checkout time is 11:00 AM the day of discharge.   Patients discharged the day of surgery will not be allowed to drive home.     Electrical Cardioversion Electrical cardioversion is the delivery of a jolt of electricity to restore a normal rhythm to the heart. A rhythm that is too fast or is not regular keeps the heart from pumping well. In this procedure, sticky patches or metal paddles are placed on the chest to deliver electricity to the heart from a device. This procedure may be done in an emergency if:  There is low or no blood pressure as a result of the heart rhythm.  Normal rhythm must be restored as fast as possible to protect the brain and heart from further damage.  It may save a life. This may also be a scheduled procedure for irregular or fast heart rhythms that are not immediately life-threatening. Tell a health care provider about:  Any allergies you have.  All medicines you are taking, including vitamins, herbs, eye drops, creams, and over-the-counter medicines.  Any problems you or family members have had with anesthetic medicines.  Any blood disorders you  have.  Any surgeries you have had.  Any medical conditions you have.  Whether you are pregnant or may be pregnant. What are the risks? Generally, this is a safe procedure. However, problems may occur, including:  Allergic reactions to medicines.  A blood clot that breaks free and travels to other parts of your body.  The possible return of an abnormal heart rhythm within hours or days after the procedure.  Your heart stopping (cardiac arrest). This is rare. What happens before the procedure? Medicines  Your health care provider may have you start taking: ? Blood-thinning medicines (anticoagulants) so your blood does not clot as easily. ? Medicines to help stabilize your heart rate and rhythm.  Ask your health care provider about: ? Changing or stopping your regular medicines. This is especially important if you are taking diabetes medicines or blood thinners. ? Taking medicines such as aspirin and ibuprofen. These medicines can thin your blood. Do not take these medicines unless your health care provider tells you to take them. ? Taking over-the-counter medicines, vitamins, herbs, and supplements. General instructions  Follow instructions from your health care provider about eating or drinking restrictions.  Plan to have someone take you home from the hospital or clinic.  If you will be going home right after the procedure, plan to have someone with you  for 24 hours.  Ask your health care provider what steps will be taken to help prevent infection. These may include washing your skin with a germ-killing soap. What happens during the procedure?   An IV will be inserted into one of your veins.  Sticky patches (electrodes) or metal paddles may be placed on your chest.  You will be given a medicine to help you relax (sedative).  An electrical shock will be delivered. The procedure may vary among health care providers and hospitals. What can I expect after the  procedure?  Your blood pressure, heart rate, breathing rate, and blood oxygen level will be monitored until you leave the hospital or clinic.  Your heart rhythm will be watched to make sure it does not change.  You may have some redness on the skin where the shocks were given. Follow these instructions at home:  Do not drive for 24 hours if you were given a sedative during your procedure.  Take over-the-counter and prescription medicines only as told by your health care provider.  Ask your health care provider how to check your pulse. Check it often.  Rest for 48 hours after the procedure or as told by your health care provider.  Avoid or limit your caffeine use as told by your health care provider.  Keep all follow-up visits as told by your health care provider. This is important. Contact a health care provider if:  You feel like your heart is beating too quickly or your pulse is not regular.  You have a serious muscle cramp that does not go away. Get help right away if:  You have discomfort in your chest.  You are dizzy or you feel faint.  You have trouble breathing or you are short of breath.  Your speech is slurred.  You have trouble moving an arm or leg on one side of your body.  Your fingers or toes turn cold or blue. Summary  Electrical cardioversion is the delivery of a jolt of electricity to restore a normal rhythm to the heart.  This procedure may be done right away in an emergency or may be a scheduled procedure if the condition is not an emergency.  Generally, this is a safe procedure.  After the procedure, check your pulse often as told by your health care provider. This information is not intended to replace advice given to you by your health care provider. Make sure you discuss any questions you have with your health care provider. Document Revised: 03/05/2019 Document Reviewed: 03/05/2019 Elsevier Patient Education  [employer] These instructions provide you with information about caring for yourself after your procedure. Your health care provider may also give you more specific instructions. Your treatment has been planned according to current medical practices, but problems sometimes occur. Call your health care provider if you have any problems or questions after your procedure. What can I expect after the procedure? After your procedure, you may:  Feel sleepy for several hours.  Feel clumsy and have poor balance for several hours.  Feel forgetful about what happened after the procedure.  Have poor judgment for several hours.  Feel nauseous or vomit.  Have a sore throat if you had a breathing tube during the procedure. Follow these instructions at home: For at least 24 hours after the procedure:      Have a responsible adult stay with you. It is important to have someone help care for you until  you are awake and alert.  Rest as needed.  Do not: ? Participate in activities in which you could fall or become injured. ? Drive. ? Use heavy machinery. ? Drink alcohol. ? Take sleeping pills or medicines that cause drowsiness. ? Make important decisions or sign legal documents. ? Take care of children on your own. Eating and drinking  Follow the diet that is recommended by your health care provider.  If you vomit, drink water, juice, or soup when you can drink without vomiting.  Make sure you have little or no nausea before eating solid foods. General instructions  Take over-the-counter and prescription medicines only as told by your health care provider.  If you have sleep apnea, surgery and certain medicines can increase your risk for breathing problems. Follow instructions from your health care provider about wearing your sleep device: ? Anytime you are sleeping, including during daytime naps. ? While taking prescription pain medicines, sleeping medicines, or  medicines that make you drowsy.  If you smoke, do not smoke without supervision.  Keep all follow-up visits as told by your health care provider. This is important. Contact a health care provider if:  You keep feeling nauseous or you keep vomiting.  You feel light-headed.  You develop a rash.  You have a fever. Get help right away if:  You have trouble breathing. Summary  For several hours after your procedure, you may feel sleepy and have poor judgment.  Have a responsible adult stay with you for at least 24 hours or until you are awake and alert. This information is not intended to replace advice given to you by your health care provider. Make sure you discuss any questions you have with your health care provider. Document Revised: 10/31/2017 Document Reviewed: 11/23/2015 Elsevier Patient Education  [place].

## 2020-04-25 ENCOUNTER — Telehealth: Payer: Self-pay | Admitting: Internal Medicine

## 2020-04-25 NOTE — Telephone Encounter (Signed)
PERCERT:  DCCV - scheduled for Thursday, 9/16 - 9:30 - [place] at [facility]

## 2020-04-29 ENCOUNTER — Encounter (HOSPITAL_COMMUNITY)
Admission: RE | Admit: 2020-04-29 | Discharge: 2020-04-29 | Disposition: A | Discharge status: Home / Self Care | Payer: Medicare Other | Source: Ambulatory Visit | Attending: Cardiology | Admitting: Cardiology

## 2020-04-29 ENCOUNTER — Encounter (HOSPITAL_COMMUNITY): Payer: Self-pay

## 2020-04-29 ENCOUNTER — Telehealth: Payer: Self-pay | Admitting: *Deleted

## 2020-04-29 ENCOUNTER — Other Ambulatory Visit (HOSPITAL_COMMUNITY)
Admission: RE | Admit: 2020-04-29 | Discharge: 2020-04-29 | Disposition: A | Discharge status: Home / Self Care | Payer: Medicare Other | Source: Ambulatory Visit | Attending: Cardiology | Admitting: Cardiology

## 2020-04-29 ENCOUNTER — Ambulatory Visit (INDEPENDENT_AMBULATORY_CARE_PROVIDER_SITE_OTHER): Payer: Medicare Other

## 2020-04-29 ENCOUNTER — Other Ambulatory Visit: Payer: Self-pay

## 2020-04-29 ENCOUNTER — Ambulatory Visit (INDEPENDENT_AMBULATORY_CARE_PROVIDER_SITE_OTHER): Payer: Medicare Other | Admitting: *Deleted

## 2020-04-29 DIAGNOSIS — I4819 Other persistent atrial fibrillation: Secondary | ICD-10-CM | POA: Diagnosis not present

## 2020-04-29 DIAGNOSIS — Z95810 Presence of automatic (implantable) cardiac defibrillator: Secondary | ICD-10-CM

## 2020-04-29 DIAGNOSIS — Z20822 Contact with and (suspected) exposure to covid-19: Secondary | ICD-10-CM | POA: Diagnosis not present

## 2020-04-29 DIAGNOSIS — Z01812 Encounter for preprocedural laboratory examination: Secondary | ICD-10-CM | POA: Insufficient documentation

## 2020-04-29 DIAGNOSIS — I5022 Chronic systolic (congestive) heart failure: Secondary | ICD-10-CM

## 2020-04-29 DIAGNOSIS — I472 Ventricular tachycardia, unspecified: Secondary | ICD-10-CM

## 2020-04-29 HISTORY — DX: Unspecified atrial fibrillation: I48.91

## 2020-04-29 LAB — CUP PACEART REMOTE DEVICE CHECK
Battery Remaining Longevity: 23 mo
Battery Voltage: 2.93 V
Brady Statistic RA Percent Paced: 0.33 %
Brady Statistic RV Percent Paced: 58.2 %
Date Time Interrogation Session: 20210914022604
HighPow Impedance: 64 Ohm
Implantable Lead Implant Date: 20140813
Implantable Lead Implant Date: 20140813
Implantable Lead Location: 753859
Implantable Lead Location: 753860
Implantable Lead Model: 5076
Implantable Pulse Generator Implant Date: 20140813
Lead Channel Impedance Value: 285 Ohm
Lead Channel Impedance Value: 399 Ohm
Lead Channel Impedance Value: 399 Ohm
Lead Channel Pacing Threshold Amplitude: 0.75 V
Lead Channel Pacing Threshold Amplitude: 1 V
Lead Channel Pacing Threshold Pulse Width: 0.4 ms
Lead Channel Pacing Threshold Pulse Width: 0.4 ms
Lead Channel Sensing Intrinsic Amplitude: 1 mV
Lead Channel Sensing Intrinsic Amplitude: 1 mV
Lead Channel Sensing Intrinsic Amplitude: 22.375 mV
Lead Channel Sensing Intrinsic Amplitude: 22.375 mV
Lead Channel Setting Pacing Amplitude: 1.75 V
Lead Channel Setting Pacing Amplitude: 2.25 V
Lead Channel Setting Pacing Pulse Width: 0.4 ms
Lead Channel Setting Sensing Sensitivity: 0.3 mV

## 2020-04-29 NOTE — Progress Notes (Signed)
EPIC Encounter for ICM Monitoring  Patient Name: Stephen Cardenas is a 64 y.o. male Date: 04/29/2020 Primary Care Physican: [facility] Primary Cardiologist:Bensimhon Electrophysiologist:[facility] 04/29/2020 Weight:195lbs   Clinical Status (09-Apr-2020 to 29-Apr-2020)  AT/AF 1 episode Time in AT/AF 24.0 hr/day (100.0%) Longest AT/AF 23 days  Spoke with patient and he reports weight fluctuates a few pound.  Highest weight this week has been 202 lbs and today is 195 lbs.  He is scheduled for cardioversion on 9/16 and told to hold Lasix that day.     Optivol thoracic impedancesuggesting possible fluid accumulation since 04/22/2020.Advised pt that afib can cause fluid accumulation and this may improve after cardioversion.  Prescribed:  Furosemide20 mgtake1tablet(20 mg total)twice a day.Per patient, he self adjusts Furosemide and Potassium when needed with Dr Bensimhon's approval.  Potassium10 mEq take1tablettwice a day.  Spironolactone 25 mg take 1 tablet daily  Eliquis 5 mg take 1 tablet twice a day  Labs: 10/08/2019 Creatinine 1.35, BUN 14, Potassium 3.8, Sodium 137, GFR 55->60 07/20/2019 Creatinine 1.28, BUN 20, Potassium 4.6, Sodium 138, GFR 58-67 A complete set of results can be found in Results Review.  Recommendations:Patient will adjust Furosemide and Potassium as needed after cardioversion.  Advised to limit salt intake.  Follow-up plan: ICM clinic phone appointment on9/20/2021 (manual) to recheck fluid levels 91 day device clinic remote transmission12/14/2021.  EP/Cardiology Office Visits:05/26/2020 with [place].   Copy of ICM check sent to Dr.Allred andDr Bensimhon for FYI/reivew.  3 month ICM trend: 04/28/2020    1 Year ICM trend:       [name], RN 04/29/2020 9:53 AM

## 2020-04-29 NOTE — Telephone Encounter (Signed)
Lab results from 04/09/20 (viewed in [place]) reviewed by Dr. Rayann Heman.  Per Dr. Rayann Heman, plan to start magnesium oxide 400mg  daily as Mg was 1.6mg /dL; pt should f/u with PCP regarding elevated TSH.  BMET and dig level were stable.  Attempted to reach pt at home and cell numbers.  No VM setup at cell number.  LMOM at home number requesting call back to DC.  Direct number and office hours provided.

## 2020-04-30 LAB — SARS CORONAVIRUS 2 (TAT 6-24 HRS): SARS Coronavirus 2: NEGATIVE

## 2020-04-30 NOTE — Telephone Encounter (Signed)
Unable to reach patient concerning lab result and treatment plan.

## 2020-05-01 ENCOUNTER — Encounter (HOSPITAL_COMMUNITY)
Admission: RE | Disposition: A | Discharge status: Home / Self Care | Payer: Self-pay | Source: Home / Self Care | Attending: Cardiology

## 2020-05-01 ENCOUNTER — Ambulatory Visit (HOSPITAL_COMMUNITY): Payer: Medicare Other | Admitting: Anesthesiology

## 2020-05-01 ENCOUNTER — Ambulatory Visit (HOSPITAL_COMMUNITY)
Admission: RE | Admit: 2020-05-01 | Discharge: 2020-05-01 | Disposition: A | Discharge status: Home / Self Care | Payer: Medicare Other | Attending: Cardiology | Admitting: Cardiology

## 2020-05-01 ENCOUNTER — Other Ambulatory Visit: Payer: Medicare Other

## 2020-05-01 ENCOUNTER — Encounter (HOSPITAL_COMMUNITY): Payer: Self-pay | Admitting: Cardiology

## 2020-05-01 DIAGNOSIS — I252 Old myocardial infarction: Secondary | ICD-10-CM | POA: Diagnosis not present

## 2020-05-01 DIAGNOSIS — J449 Chronic obstructive pulmonary disease, unspecified: Secondary | ICD-10-CM | POA: Insufficient documentation

## 2020-05-01 DIAGNOSIS — I509 Heart failure, unspecified: Secondary | ICD-10-CM | POA: Diagnosis not present

## 2020-05-01 DIAGNOSIS — G4733 Obstructive sleep apnea (adult) (pediatric): Secondary | ICD-10-CM | POA: Diagnosis not present

## 2020-05-01 DIAGNOSIS — I10 Essential (primary) hypertension: Secondary | ICD-10-CM | POA: Diagnosis not present

## 2020-05-01 DIAGNOSIS — E785 Hyperlipidemia, unspecified: Secondary | ICD-10-CM | POA: Insufficient documentation

## 2020-05-01 DIAGNOSIS — I2510 Atherosclerotic heart disease of native coronary artery without angina pectoris: Secondary | ICD-10-CM | POA: Insufficient documentation

## 2020-05-01 DIAGNOSIS — Z79899 Other long term (current) drug therapy: Secondary | ICD-10-CM | POA: Insufficient documentation

## 2020-05-01 DIAGNOSIS — I5022 Chronic systolic (congestive) heart failure: Secondary | ICD-10-CM | POA: Diagnosis not present

## 2020-05-01 DIAGNOSIS — Z955 Presence of coronary angioplasty implant and graft: Secondary | ICD-10-CM | POA: Diagnosis not present

## 2020-05-01 DIAGNOSIS — K219 Gastro-esophageal reflux disease without esophagitis: Secondary | ICD-10-CM | POA: Diagnosis not present

## 2020-05-01 DIAGNOSIS — I255 Ischemic cardiomyopathy: Secondary | ICD-10-CM | POA: Diagnosis not present

## 2020-05-01 DIAGNOSIS — I447 Left bundle-branch block, unspecified: Secondary | ICD-10-CM | POA: Diagnosis not present

## 2020-05-01 DIAGNOSIS — G4730 Sleep apnea, unspecified: Secondary | ICD-10-CM | POA: Insufficient documentation

## 2020-05-01 DIAGNOSIS — I4891 Unspecified atrial fibrillation: Secondary | ICD-10-CM | POA: Diagnosis not present

## 2020-05-01 DIAGNOSIS — R0602 Shortness of breath: Secondary | ICD-10-CM | POA: Diagnosis not present

## 2020-05-01 DIAGNOSIS — Z95810 Presence of automatic (implantable) cardiac defibrillator: Secondary | ICD-10-CM | POA: Insufficient documentation

## 2020-05-01 DIAGNOSIS — E039 Hypothyroidism, unspecified: Secondary | ICD-10-CM | POA: Insufficient documentation

## 2020-05-01 DIAGNOSIS — Z87891 Personal history of nicotine dependence: Secondary | ICD-10-CM | POA: Insufficient documentation

## 2020-05-01 DIAGNOSIS — Z7982 Long term (current) use of aspirin: Secondary | ICD-10-CM | POA: Diagnosis not present

## 2020-05-01 DIAGNOSIS — I110 Hypertensive heart disease with heart failure: Secondary | ICD-10-CM | POA: Diagnosis not present

## 2020-05-01 DIAGNOSIS — F329 Major depressive disorder, single episode, unspecified: Secondary | ICD-10-CM | POA: Diagnosis not present

## 2020-05-01 DIAGNOSIS — F419 Anxiety disorder, unspecified: Secondary | ICD-10-CM | POA: Diagnosis not present

## 2020-05-01 DIAGNOSIS — I4819 Other persistent atrial fibrillation: Secondary | ICD-10-CM | POA: Insufficient documentation

## 2020-05-01 DIAGNOSIS — Z7901 Long term (current) use of anticoagulants: Secondary | ICD-10-CM | POA: Insufficient documentation

## 2020-05-01 HISTORY — PX: CARDIOVERSION: SHX1299

## 2020-05-01 SURGERY — CARDIOVERSION
Anesthesia: General

## 2020-05-01 MED ORDER — PROPOFOL 10 MG/ML IV BOLUS
INTRAVENOUS | Status: AC
  Filled 2020-05-01: qty 40

## 2020-05-01 MED ORDER — MAGNESIUM OXIDE 400 MG PO TABS
400.0000 mg | ORAL_TABLET | Freq: Every day | ORAL | 3 refills | Status: AC
Start: 2020-05-01 — End: ?

## 2020-05-01 MED ORDER — LIDOCAINE HCL (CARDIAC) PF 100 MG/5ML IV SOSY
PREFILLED_SYRINGE | INTRAVENOUS | Status: DC | PRN
  Administered 2020-05-01: 50 mg via INTRAVENOUS

## 2020-05-01 MED ORDER — LACTATED RINGERS IV SOLN: Freq: Once | INTRAVENOUS | Status: AC

## 2020-05-01 MED ORDER — PROPOFOL 10 MG/ML IV BOLUS
INTRAVENOUS | Status: DC | PRN
  Administered 2020-05-01: 70 mg via INTRAVENOUS

## 2020-05-01 NOTE — Progress Notes (Signed)
Remote ICD transmission.   

## 2020-05-01 NOTE — Telephone Encounter (Signed)
Able to reach pt.  He agrees to start magnesium oxide 400mg  daily.  Requested 90 day supply sent to [facility] in [place].  Prescription sent electronically.  Pt agrees to f/u with PCP regarding TSH.  Pt denies additional questions or concerns at this time.

## 2020-05-01 NOTE — Transfer of Care (Signed)
Immediate Anesthesia Transfer of Care Note  Patient: Atwood Adcock  Procedure(s) Performed: CARDIOVERSION (N/A )  Patient Location: PACU  Anesthesia Type:MAC  Level of Consciousness: awake, alert , oriented and patient cooperative  Airway & Oxygen Therapy: Patient Spontanous Breathing and Patient connected to face mask oxygen  Post-op Assessment: Report given to RN, Post -op Vital signs reviewed and stable and Patient moving all extremities X 4  Post vital signs: Reviewed and stable  Last Vitals:  Vitals Value Taken Time  BP    Temp    Pulse    Resp    SpO2      Last Pain:  Vitals:   05/01/20 0830  TempSrc: (P) Oral  PainSc: (P) 0-No pain      Patients Stated Pain Goal: (P) 2 (12/45/80 9983)  Complications: No complications documented.

## 2020-05-01 NOTE — Progress Notes (Signed)
Electrical Cardioversion Procedure Note Jaimie Redditt [id number] May 08, 1956  Procedure: Electrical Cardioversion Indications:  Atrial Fibrillation  Procedure Details Consent: Risks of procedure as well as the alternatives and risks of each were explained to the (patient/caregiver).  Consent for procedure obtained. Time Out: Verified patient identification, verified procedure, site/side was marked, verified correct patient position, special equipment/implants available, medications/allergies/relevent history reviewed, required imaging and test results available.  Performed  Patient placed on cardiac monitor, pulse oximetry, supplemental oxygen as necessary.  Sedation given: propofol and lidocaine administered by Yetta Barre, CRNA Pacer pads placed anterior and posterior chest.Placement confirmed by anesthesia.  Cardioverted 1 time(s).  Cardioverted at [place].  Evaluation Findings: Post procedure EKG shows: NSR with AV block  Complications: None Patient did tolerate procedure well.   Charm Barges S 05/01/2020, 11:13 AM

## 2020-05-01 NOTE — Anesthesia Preprocedure Evaluation (Signed)
Anesthesia Evaluation  Patient identified by MRN, date of birth, ID band Patient awake    Reviewed: Allergy & Precautions, NPO status , Patient's Chart, lab work & pertinent test results, reviewed documented beta blocker date and time   Airway Mallampati: II  TM Distance: >3 FB Neck ROM: Full    Dental  (+) Edentulous Upper, Edentulous Lower   Pulmonary shortness of breath, sleep apnea , COPD, former smoker,    Pulmonary exam normal breath sounds clear to auscultation       Cardiovascular Exercise Tolerance: Good hypertension, Pt. on home beta blockers and Pt. on medications + CAD and +CHF (EF- 20%)  + dysrhythmias Atrial Fibrillation + Cardiac Defibrillator  Rhythm:Irregular Rate:Normal  FINDINGS  Left Ventricle: Left ventricular ejection fraction, by estimation, is  20%. The left ventricle has severely decreased function. The left  ventricle demonstrates regional wall motion abnormalities. The left  ventricular internal cavity size was moderately  dilated. There is borderline left ventricular hypertrophy. Left  ventricular diastolic parameters are indeterminate.    Neuro/Psych PSYCHIATRIC DISORDERS Anxiety Depression negative neurological ROS     GI/Hepatic Neg liver ROS, GERD  Medicated and Controlled,  Endo/Other  Hypothyroidism   Renal/GU Renal disease  negative genitourinary   Musculoskeletal negative musculoskeletal ROS (+)   Abdominal   Peds  Hematology negative hematology ROS (+)   Anesthesia Other Findings   Reproductive/Obstetrics negative OB ROS                             Anesthesia Physical Anesthesia Plan  ASA: IV  Anesthesia Plan: General   Post-op Pain Management:    Induction:   PONV Risk Score and Plan: TIVA  Airway Management Planned: Nasal Cannula and Natural Airway  Additional Equipment:   Intra-op Plan:   Post-operative Plan:   Informed Consent:  I have reviewed the patients History and Physical, chart, labs and discussed the procedure including the risks, benefits and alternatives for the proposed anesthesia with the patient or authorized representative who has indicated his/her understanding and acceptance.       Plan Discussed with: CRNA and Surgeon  Anesthesia Plan Comments:         Anesthesia Quick Evaluation

## 2020-05-01 NOTE — CV Procedure (Signed)
CV procedure note Procedure: Electrical cardioversion Physician: Dr [name]  Indication: Persistent afib  Patient was brought to the procedure suite after appropriate consent was obtained. I independently confirmed he had not missed any eliquis doses within the last 3 weeks. Sedation was achieved with the assistance of anesthesiology, please see there note for full details. Defib pads placed in anterior and posterior postions, he was succesfully converted with a single synchronized 200 j shock. Cardiopulmonary monitoring was performed throughout the procedure, he tolerated well without complication.  Post conversion rhythm strip showed sinus rhythm, at time 12 lead EKG was completed shows SR with 1st degree AV block.    [name] MD

## 2020-05-01 NOTE — Discharge Instructions (Signed)
Electrical Cardioversion Electrical cardioversion is the delivery of a jolt of electricity to restore a normal rhythm to the heart. A rhythm that is too fast or is not regular keeps the heart from pumping well. In this procedure, sticky patches or metal paddles are placed on the chest to deliver electricity to the heart from a device. This procedure may be done in an emergency if:  There is low or no blood pressure as a result of the heart rhythm.  Normal rhythm must be restored as fast as possible to protect the brain and heart from further damage.  It may save a life. This may also be a scheduled procedure for irregular or fast heart rhythms that are not immediately life-threatening. Tell a health care provider about:  Any allergies you have.  All medicines you are taking, including vitamins, herbs, eye drops, creams, and over-the-counter medicines.  Any problems you or family members have had with anesthetic medicines.  Any blood disorders you have.  Any surgeries you have had.  Any medical conditions you have.  Whether you are pregnant or may be pregnant. What are the risks? Generally, this is a safe procedure. However, problems may occur, including:  Allergic reactions to medicines.  A blood clot that breaks free and travels to other parts of your body.  The possible return of an abnormal heart rhythm within hours or days after the procedure.  Your heart stopping (cardiac arrest). This is rare. What happens before the procedure? Medicines  Your health care provider may have you start taking: ? Blood-thinning medicines (anticoagulants) so your blood does not clot as easily. ? Medicines to help stabilize your heart rate and rhythm.  Ask your health care provider about: ? Changing or stopping your regular medicines. This is especially important if you are taking diabetes medicines or blood thinners. ? Taking medicines such as aspirin and ibuprofen. These medicines can  thin your blood. Do not take these medicines unless your health care provider tells you to take them. ? Taking over-the-counter medicines, vitamins, herbs, and supplements. General instructions  Follow instructions from your health care provider about eating or drinking restrictions.  Plan to have someone take you home from the hospital or clinic.  If you will be going home right after the procedure, plan to have someone with you for 24 hours.  Ask your health care provider what steps will be taken to help prevent infection. These may include washing your skin with a germ-killing soap. What happens during the procedure?   An IV will be inserted into one of your veins.  Sticky patches (electrodes) or metal paddles may be placed on your chest.  You will be given a medicine to help you relax (sedative).  An electrical shock will be delivered. The procedure may vary among health care providers and hospitals. What can I expect after the procedure?  Your blood pressure, heart rate, breathing rate, and blood oxygen level will be monitored until you leave the hospital or clinic.  Your heart rhythm will be watched to make sure it does not change.  You may have some redness on the skin where the shocks were given. Follow these instructions at home:  Do not drive for 24 hours if you were given a sedative during your procedure.  Take over-the-counter and prescription medicines only as told by your health care provider.  Ask your health care provider how to check your pulse. Check it often.  Rest for 48 hours after the procedure or   as told by your health care provider.  Avoid or limit your caffeine use as told by your health care provider.  Keep all follow-up visits as told by your health care provider. This is important. Contact a health care provider if:  You feel like your heart is beating too quickly or your pulse is not regular.  You have a serious muscle cramp that does not go  away. Get help right away if:  You have discomfort in your chest.  You are dizzy or you feel faint.  You have trouble breathing or you are short of breath.  Your speech is slurred.  You have trouble moving an arm or leg on one side of your body.  Your fingers or toes turn cold or blue. Summary  Electrical cardioversion is the delivery of a jolt of electricity to restore a normal rhythm to the heart.  This procedure may be done right away in an emergency or may be a scheduled procedure if the condition is not an emergency.  Generally, this is a safe procedure.  After the procedure, check your pulse often as told by your health care provider. This information is not intended to replace advice given to you by your health care provider. Make sure you discuss any questions you have with your health care provider. Document Revised: 03/05/2019 Document Reviewed: 03/05/2019 Elsevier Patient Education  2020 [employer]  

## 2020-05-01 NOTE — H&P (Signed)
Pre procedure H&P  Patient presents for elective outpatient electical cardioversion for afib. He has been on eliquis at home and denies any missed doses within the last 3 weeks. Please see referenced recent clinic note below for full medical history. Will plan for cardioversion today with assistance from anesthesiology.   [name] MD   [facility]   Primary EP: Dr Rayann Heman  Stephen Cardenas is a 64 y.o. male who presents today for routine electrophysiology followup.  Since last being seen in our clinic, the patient reports doing reasonably well.  He had appropriate ICD shock 04/01/20 for polymorphic VT.  He has been instructed to not drive already. Over the past week, he has had worsening fatigue.  He has returned to afib.   SOB is stable.   Today, he denies symptoms of palpitations, chest pain,  Or  lower extremity edema.  The patient is otherwise without complaint today.       Past Medical History:  Diagnosis Date  . AICD (automatic cardioverter/defibrillator) present 2014  . CAD (coronary artery disease)    S/p multiple prior MIs/PCIs // amdx in 2014 with acute stent thrombosis c/b CGS // Admx to [facility] in 04/5283 2/2 a/c systolic HF >> [facility] 08/18/22: mLAD 95 ISR, dLAD 30, D2 99; OM2 95 ISR; pRCA 30/50, mRCA 90/80, dRCA 30 EF 15 >> min to no viability on Thallium study (not surgical candidate) - PCI>> 3.5 x 20 mm Promus DES to the LCx 3 x 38 mm Promus DES to the proximal mid RCA  . Chronic systolic heart failure (HCC)    2/2 to ischemic CM - Echo 02/12/17:  Mild LVH, EF 15-20, mod to severe MR, severe LAE, mild RVE, mod reduced RVSF, mild RAE, mod TR, PASP 65  . COPD (chronic obstructive pulmonary disease) ([place])   . History of MI (myocardial infarction)   . History of pneumonia   . HTN (hypertension)   . Hyperlipidemia 03/08/2017  . Hypothyroidism   . OSA (obstructive sleep apnea) 03/08/2017        Past Surgical History:  Procedure Laterality Date  . ATRIAL  FIBRILLATION ABLATION N/A 07/24/2019   Procedure: ATRIAL FIBRILLATION ABLATION;  Surgeon: Thompson Grayer, MD;  Location: [place] [facility];  Service: Cardiovascular;  Laterality: N/A;  . CARDIAC DEFIBRILLATOR PLACEMENT  2014   Medtronic Every XT DR ICD implanted at the [facility] for primary prevention  . CARDIOVERSION N/A 03/09/2018   Procedure: CARDIOVERSION;  Surgeon: Jolaine Artist, MD;  Location: [facility] ENDOSCOPY;  Service: Cardiovascular;  Laterality: N/A;  . CORONARY STENT INTERVENTION N/A 02/22/2017   Procedure: Coronary Stent Intervention;  Surgeon: Sherren Mocha, MD;  Location: [place] [facility];  Service: Cardiovascular;  Laterality: N/A;  CFX and RCA  . FACIAL RECONSTRUCTION SURGERY Right 1984  . FEMUR FRACTURE SURGERY Right 1984  . RIGHT/LEFT HEART CATH AND CORONARY ANGIOGRAPHY N/A 02/14/2017   Procedure: Right/Left Heart Cath and Coronary Angiography;  Surgeon: Jolaine Artist, MD;  Location: [place] [facility];  Service: Cardiovascular;  Laterality: N/A;  . WRIST FRACTURE SURGERY Right 2015    ROS- all systems are reviewed and negative except as per HPI above        Current Outpatient Medications  Medication Sig Dispense Refill  . ALPRAZolam (XANAX) 0.5 MG tablet Take 0.5-1 mg by mouth 3 (three) times daily as needed for anxiety.     Marland Kitchen apixaban (ELIQUIS) 5 MG TABS tablet Take 1 tablet (5 mg total) by mouth 2 (  two) times daily. 60 tablet 6  . ascorbic acid (VITAMIN C) 500 MG tablet Take 500 mg by mouth daily.    Marland Kitchen aspirin EC 81 MG tablet Take 81 mg by mouth daily.    Marland Kitchen atorvastatin (LIPITOR) 20 MG tablet Take 20 mg by mouth at bedtime. Pt taking about 3 times a week    . carvedilol (COREG) 3.125 MG tablet Take 1 tablet (3.125 mg total) by mouth 2 (two) times daily with a meal. 60 tablet 3  . Cholecalciferol (VITAMIN D3) 2000 units TABS Take 2,000 Units by mouth daily.    . digoxin (LANOXIN) 0.125 MG tablet Take 1 tablet (0.125 mg total) by mouth every  other day. (Patient taking differently: Take 0.125 mg by mouth daily. ) 45 tablet 3  . DULoxetine (CYMBALTA) 30 MG capsule Take 90 mg by mouth at bedtime.     . Ferrous Sulfate (IRON) 325 (65 Fe) MG TABS TAKE 1 TABLET BY MOUTH TWICE DAILY WITH A MEAL 60 tablet 3  . furosemide (LASIX) 20 MG tablet Take 20 mg by mouth 2 (two) times daily.    Marland Kitchen levothyroxine (SYNTHROID) 200 MCG tablet Take 1 tablet (200 mcg total) by mouth daily at 6 (six) AM. 30 tablet 1  . Multiple Vitamins-Minerals (MULTIVITAMIN PO) Take 1 tablet by mouth daily.    . pantoprazole (PROTONIX) 40 MG tablet Take 1 tablet (40 mg total) by mouth at bedtime. 30 tablet 6  . potassium chloride (K-DUR) 10 MEQ tablet Take 10 mEq by mouth 2 (two) times daily.    . sacubitril-valsartan (ENTRESTO) 49-51 MG Take 1 tablet by mouth 2 (two) times daily. 60 tablet 6  . spironolactone (ALDACTONE) 25 MG tablet Take 1 tablet (25 mg total) by mouth daily. 30 tablet 3  . vitamin B-12 (CYANOCOBALAMIN) 1000 MCG tablet Take 2,000 mcg by mouth daily.     No current facility-administered medications for this visit.    Physical Exam:    Vitals:   04/18/20 1206  BP: 100/64  Pulse: 64  SpO2: 96%  Weight: 195 lb (88.5 kg)  Height: 5\' 10"  (1.778 m)    GEN- The patient is chronically ill appearing, alert and oriented x 3 today.   Head- normocephalic, atraumatic Eyes-  Sclera clear, conjunctiva pink Ears- hearing intact Oropharynx- clear Lungs-   normal work of breathing Chest- ICD pocket is well healed Heart- irregular rate and rhythm  GI- soft  Extremities- no clubbing, cyanosis, or edema  ICD interrogation- reviewed in detail today,  See PACEART report  ekg tracing ordered today is personally reviewed and shows afib, LBBB, QRS > 150 msec     Wt Readings from Last 3 Encounters:  04/18/20 195 lb (88.5 kg)  10/22/19 203 lb (92.1 kg)  10/08/19 201 lb 9.6 oz (91.4 kg)    Assessment and Plan:  1.  Chronic systolic  dysfunction/ ischemic CM/ LBBB euvolemic today Stable on an appropriate medical regimen Normal ICD function by remotes See [facility] report No changes today he is not device dependant today  followed in ICM device clinic Consider upgrade to CRT once his arrhythnmias are more stable Update echo  2. Polymorphic VT Appropriate shock for PMVT 04/01/20 at 15:02 He has been instructed to not driving for 6 months If further episodes, restart amiodarone Update echo Labs ordered (bmet, mg, dig level, tsh) but has not complied with obtaining these  3.  Persistent afib Back in afib S/p ablation previously chads2vasc score is 4.  Continue  eliquis He reports compliance without interruption We will plan cardioversion next week I discussed risks including stroke and sedation at length today.  He wishes to proceed.  Importance of compliance with eliquis was discussed at length. Consider restarting amiodarone vs repeat ablation depending on how he does going forward  4. HTN Stable No change required today  Follow-up in AF clinic 1 week post cardioversion I will see in 3 months  Risks, benefits and potential toxicities for medications prescribed and/or refilled reviewed with patient today.   Thompson Grayer MD, [facility] 04/18/2020 12:25 PM

## 2020-05-01 NOTE — Anesthesia Postprocedure Evaluation (Signed)
Anesthesia Post Note  Patient: Julian Medina  Procedure(s) Performed: CARDIOVERSION (N/A )  Patient location during evaluation: PACU Anesthesia Type: General Level of consciousness: awake, oriented, awake and alert and patient cooperative Pain management: satisfactory to patient Vital Signs Assessment: post-procedure vital signs reviewed and stable Respiratory status: spontaneous breathing, respiratory function stable, nonlabored ventilation and patient connected to face mask oxygen Cardiovascular status: stable Postop Assessment: no apparent nausea or vomiting Anesthetic complications: no   No complications documented.   Last Vitals:  Vitals:   05/01/20 0830  BP: (P) 139/86  Pulse: (P) 77  Resp: (!) (P) 27  Temp: (P) 37 C  SpO2: (P) 97%    Last Pain:  Vitals:   05/01/20 0830  TempSrc: (P) Oral  PainSc: (P) 0-No pain                 Elianis Fischbach

## 2020-05-02 ENCOUNTER — Encounter (HOSPITAL_COMMUNITY): Payer: Self-pay | Admitting: Cardiology

## 2020-05-08 ENCOUNTER — Ambulatory Visit (HOSPITAL_COMMUNITY): Payer: Medicare Other | Admitting: Nurse Practitioner

## 2020-05-12 ENCOUNTER — Ambulatory Visit (INDEPENDENT_AMBULATORY_CARE_PROVIDER_SITE_OTHER): Payer: Medicare Other

## 2020-05-12 DIAGNOSIS — Z95810 Presence of automatic (implantable) cardiac defibrillator: Secondary | ICD-10-CM | POA: Diagnosis not present

## 2020-05-12 DIAGNOSIS — I5022 Chronic systolic (congestive) heart failure: Secondary | ICD-10-CM

## 2020-05-12 NOTE — Progress Notes (Signed)
EPIC Encounter for ICM Monitoring  Patient Name: Stephen Cardenas is a 64 y.o. male Date: 05/12/2020 Primary Care Physican: Stephen Cardenas Primary Cardiologist:Stephen Cardenas Electrophysiologist:Stephen Cardenas 04/29/2020 Weight:195lbs  Clinical Status (29-Apr-2020 to 12-May-2020) AT/AF   1 episode Time in AT/AF  15.7 hr/day (65.6%) Longest AT/AF 25 days  Transmission reviewed.  Pt had cardioversion on 9/16.    Optivol thoracic impedancesuggesting fluid levels have returned to normal.  Prescribed:  Furosemide20 mgtake1tablet(20 mg total)twice a day.Per patient, he self adjusts Furosemide and Potassium when needed with Dr Stephen Cardenas's approval.  Potassium10 mEq take1tablettwice a day.  Spironolactone 25 mg take 1 tablet daily  Eliquis 5 mg take 1 tablet twice a day  Labs: 10/08/2019 Creatinine 1.35, BUN 14, Potassium 3.8, Sodium 137, GFR 55->60 07/20/2019 Creatinine 1.28, BUN 20, Potassium 4.6, Sodium 138, GFR 58-67 A complete set of results can be found in Results Review.  Recommendations: No changes.  Follow-up plan: ICM clinic phone appointment on11/08/2019. 91 day device clinic remote transmission12/14/2021.  EP/Cardiology Office Visits:05/26/2020 with Stephen Cardenas.   Copy of ICM check sent to Dr.Allred    3 month ICM trend: 05/12/2020    1 Year ICM trend:       Stephen Billings, RN 05/12/2020 4:00 PM

## 2020-05-16 ENCOUNTER — Encounter (HOSPITAL_COMMUNITY): Payer: Medicare Other | Admitting: Internal Medicine

## 2020-05-26 ENCOUNTER — Other Ambulatory Visit: Payer: Self-pay

## 2020-05-26 ENCOUNTER — Ambulatory Visit (HOSPITAL_COMMUNITY)
Admission: RE | Admit: 2020-05-26 | Discharge: 2020-05-26 | Disposition: A | Discharge status: Home / Self Care | Payer: Medicare Other | Source: Ambulatory Visit | Attending: Internal Medicine | Admitting: Internal Medicine

## 2020-05-26 VITALS — BP 120/70 | HR 82 | Wt 199.2 lb

## 2020-05-26 DIAGNOSIS — Z7982 Long term (current) use of aspirin: Secondary | ICD-10-CM | POA: Diagnosis not present

## 2020-05-26 DIAGNOSIS — I447 Left bundle-branch block, unspecified: Secondary | ICD-10-CM | POA: Insufficient documentation

## 2020-05-26 DIAGNOSIS — Z87891 Personal history of nicotine dependence: Secondary | ICD-10-CM | POA: Insufficient documentation

## 2020-05-26 DIAGNOSIS — Z7952 Long term (current) use of systemic steroids: Secondary | ICD-10-CM | POA: Insufficient documentation

## 2020-05-26 DIAGNOSIS — Z955 Presence of coronary angioplasty implant and graft: Secondary | ICD-10-CM | POA: Insufficient documentation

## 2020-05-26 DIAGNOSIS — Z79899 Other long term (current) drug therapy: Secondary | ICD-10-CM | POA: Diagnosis not present

## 2020-05-26 DIAGNOSIS — I4819 Other persistent atrial fibrillation: Secondary | ICD-10-CM | POA: Diagnosis not present

## 2020-05-26 DIAGNOSIS — Z79890 Hormone replacement therapy (postmenopausal): Secondary | ICD-10-CM | POA: Diagnosis not present

## 2020-05-26 DIAGNOSIS — I255 Ischemic cardiomyopathy: Secondary | ICD-10-CM | POA: Diagnosis not present

## 2020-05-26 DIAGNOSIS — Z95810 Presence of automatic (implantable) cardiac defibrillator: Secondary | ICD-10-CM

## 2020-05-26 DIAGNOSIS — E785 Hyperlipidemia, unspecified: Secondary | ICD-10-CM | POA: Diagnosis not present

## 2020-05-26 DIAGNOSIS — G4733 Obstructive sleep apnea (adult) (pediatric): Secondary | ICD-10-CM | POA: Insufficient documentation

## 2020-05-26 DIAGNOSIS — J449 Chronic obstructive pulmonary disease, unspecified: Secondary | ICD-10-CM | POA: Insufficient documentation

## 2020-05-26 DIAGNOSIS — I5022 Chronic systolic (congestive) heart failure: Secondary | ICD-10-CM | POA: Insufficient documentation

## 2020-05-26 DIAGNOSIS — J9611 Chronic respiratory failure with hypoxia: Secondary | ICD-10-CM | POA: Diagnosis not present

## 2020-05-26 DIAGNOSIS — I110 Hypertensive heart disease with heart failure: Secondary | ICD-10-CM | POA: Insufficient documentation

## 2020-05-26 DIAGNOSIS — E039 Hypothyroidism, unspecified: Secondary | ICD-10-CM | POA: Diagnosis not present

## 2020-05-26 DIAGNOSIS — I252 Old myocardial infarction: Secondary | ICD-10-CM | POA: Insufficient documentation

## 2020-05-26 DIAGNOSIS — I2510 Atherosclerotic heart disease of native coronary artery without angina pectoris: Secondary | ICD-10-CM | POA: Diagnosis not present

## 2020-05-26 DIAGNOSIS — R0683 Snoring: Secondary | ICD-10-CM | POA: Diagnosis not present

## 2020-05-26 DIAGNOSIS — B1920 Unspecified viral hepatitis C without hepatic coma: Secondary | ICD-10-CM | POA: Insufficient documentation

## 2020-05-26 DIAGNOSIS — Z7901 Long term (current) use of anticoagulants: Secondary | ICD-10-CM | POA: Insufficient documentation

## 2020-05-26 LAB — COMPREHENSIVE METABOLIC PANEL
ALT: 18 U/L (ref 0–44)
AST: 25 U/L (ref 15–41)
Albumin: 3.9 g/dL (ref 3.5–5.0)
Alkaline Phosphatase: 77 U/L (ref 38–126)
Anion gap: 10 (ref 5–15)
BUN: 14 mg/dL (ref 8–23)
CO2: 26 mmol/L (ref 22–32)
Calcium: 9.5 mg/dL (ref 8.9–10.3)
Chloride: 101 mmol/L (ref 98–111)
Creatinine, Ser: 1.12 mg/dL (ref 0.61–1.24)
GFR, Estimated: >60 mL/min (ref >60)
Glucose, Bld: 192 mg/dL — ABNORMAL HIGH (ref 70–99)
Potassium: 4.4 mmol/L (ref 3.5–5.1)
Sodium: 137 mmol/L (ref 135–145)
Total Bilirubin: 0.4 mg/dL (ref 0.3–1.2)
Total Protein: 7.4 g/dL (ref 6.5–8.1)

## 2020-05-26 LAB — CBC
HCT: 35.2 % — ABNORMAL LOW (ref 39.0–52.0)
Hemoglobin: 11.7 g/dL — ABNORMAL LOW (ref 13.0–17.0)
MCH: 32.8 pg (ref 26.0–34.0)
MCHC: 33.2 g/dL (ref 30.0–36.0)
MCV: 98.6 fL (ref 80.0–100.0)
Platelets: 297 10*3/uL (ref 150–400)
RBC: 3.57 MIL/uL — ABNORMAL LOW (ref 4.22–5.81)
RDW: 14.7 % (ref 11.5–15.5)
WBC: 16.4 10*3/uL — ABNORMAL HIGH (ref 4.0–10.5)
nRBC: 0 % (ref 0.0–0.2)

## 2020-05-26 LAB — DIGOXIN LEVEL: Digoxin Level: 1.4 ng/mL (ref 1.0–2.0)

## 2020-05-26 LAB — BRAIN NATRIURETIC PEPTIDE: B Natriuretic Peptide: 1661.8 pg/mL — ABNORMAL HIGH (ref 0.0–100.0)

## 2020-05-26 MED ORDER — AMIODARONE HCL 200 MG PO TABS: 200.0000 mg | ORAL_TABLET | Freq: Every day | ORAL | 3 refills | Status: DC

## 2020-05-26 NOTE — Patient Instructions (Addendum)
START Farxiga 10mg  Daily, a prescription has been sent with you so [facility]  START Amiodarone 200mg  Daily  Your physician has recommended that you have a sleep study. This test records several body functions during sleep, including: brain activity, eye movement, oxygen and carbon dioxide blood levels, heart rate and rhythm, breathing rate and rhythm, the flow of air through your mouth and nose, snoring, body muscle movements, and chest and belly movement.  Labs done today, your results will be available in MyChart, we will contact you for abnormal readings.  You have been referred to the [facility]. They will contact you to schedule an appointment  Your physician recommends that you return for a follow up appointment in 2 month  If you have any questions or concerns before your next appointment please send [place] a message through [place] or call our office at [phone].    TO LEAVE A MESSAGE FOR THE NURSE SELECT OPTION 2, PLEASE LEAVE A MESSAGE INCLUDING: . YOUR NAME . DATE OF BIRTH . CALL BACK NUMBER . REASON FOR CALL**this is important as we prioritize the call backs  YOU WILL RECEIVE A CALL BACK THE SAME DAY AS LONG AS YOU CALL BEFORE 4:00 PM

## 2020-05-26 NOTE — Progress Notes (Signed)
Advanced Heart Failure Clinic Note   Referring Physician: Dr. Orpah Greek, [facility] Primary Care: Dr. Clementeen Graham Primary Cardiologist: Dr. Haroldine Laws.   HPI: Stephen Cardenas is a 64 y.o. male with a past medical history of chronic systolic CHF (EF 67%) due to ischemic cardiomyopathy with a history of MI. AICD, COPD, OSA on CPAP, HTN, PAF, and hypothyroidism.   He has a history of multiple MI's. One in 2007 and one in 2014 that resulted in cardiogenic shock and a prolonged ICU hospitalization.   He was admitted to [facility] in June 2018 with hypoglycemia and discharged to Rehab. He was discharged from [place] but quickly readmitted to [facility] with chest pain and SOB. He was told that he had a fluid on his lung and then transferred back to rehab and eventually discharged on 02/07/17. He presented to the ED at [facility] on 02/09/17 with SOB and weakness. Chest X ray with large pleural effusion, he was therefore transferred to [facility] where he underwent a left thoracentesis with transudative fluid.   He underwent repeat right and left heart cath on 02/14/17 that showed severe 3 vessel disease with multiple areas of in-stent restenosis. Right heart with Fick output/index 5.9/3.1. He underwent DES placement x 2 to LCx and mid - RCA. Had a 24-hour Thallium viability study with minimal to no viability in all segments except for the septum. Discharge weight was 158 pounds.   In 7/19 seen in HF Clinic with recurrent AF and NYHA IIIB symptoms. Underwent DC-CV on 03/09/18.   Seen by Dr. Prescott Gum 06/14/18 and deemed appropriate candidate for VAD if pt wishes to proceed.He has met a VAD patient and does not want to proceed with VAD implant. He does not like the idea of a big surgery or being attached to something constantly.   Had recurrent AF in 6/20 and underwent DC-CV on 02/07/19 followed by AF ablation on 12/20.   He had appropriate ICD shock 04/01/20 for polymorphic VT. Seen by Dr. Rayann Heman in  9/21 with increasing fatigue.  Found to have recurrent AF and had DC-CV 05/01/20.    Echo 9/21 EF 20% Personally reviewed   Today he returns for HF follow up. Says he is feeling OK. Getting more and more tired. Lives by himself and can do ADLs. No edema, orthopnea or PND. Can walk out to the mailbox and back. Goes to the store but has to go slowly. Denies palpitations  ICD interrogation: No VT. AF recurred 9/18 and has been constant since. Fluid mildly elevated. Activity level 1hr/day. Personally reviewed    CPX (04/06/18) FVC 3.58 (79%)    FEV1 2.91 (84%)     FEV1/FVC 81 (105%)    MVV 128 (94%)  Resting HR: 67 Peak HR: 118  (76% age predicted max HR) BP rest: 94/58 BP peak: 102/60 Peak VO2: 14.8 (50% predicted peak VO2) VE/VCO2 slope: 36  Echo 6/19 EF 20% RV ok Personally reviewed  Review of systems complete and found to be negative unless listed in HPI.    Past Medical History:  Diagnosis Date  . AICD (automatic cardioverter/defibrillator) present 2014  . Atrial fibrillation ([place])   . CAD (coronary artery disease)    S/p multiple prior MIs/PCIs // amdx in 2014 with acute stent thrombosis c/b CGS // Admx to [facility] in 08/2456 2/2 a/c systolic HF >> [facility] 0/9/98: mLAD 95 ISR, dLAD 30, D2 99; OM2 95 ISR; pRCA 30/50, mRCA 90/80, dRCA 30 EF 15 >> min to no viability  on Thallium study (not surgical candidate) - PCI>> 3.5 x 20 mm Promus DES to the LCx 3 x 38 mm Promus DES to the proximal mid RCA  . Chronic systolic heart failure (HCC)    2/2 to ischemic CM - Echo 02/12/17:  Mild LVH, EF 15-20, mod to severe MR, severe LAE, mild RVE, mod reduced RVSF, mild RAE, mod TR, PASP 65  . COPD (chronic obstructive pulmonary disease) ([place])   . History of MI (myocardial infarction)   . History of pneumonia   . HTN (hypertension)   . Hyperlipidemia 03/08/2017  . Hypothyroidism   . OSA (obstructive sleep apnea) 03/08/2017  . Polymorphic ventricular tachycardia ([place]) 04/01/2020   appropriate ICD  shock delivered    Current Outpatient Medications  Medication Sig Dispense Refill  . ALPRAZolam (XANAX) 0.5 MG tablet Take 0.5-1 mg by mouth 3 (three) times daily as needed for anxiety.     Marland Kitchen apixaban (ELIQUIS) 5 MG TABS tablet Take 1 tablet (5 mg total) by mouth 2 (two) times daily. 180 tablet 3  . ascorbic acid (VITAMIN C) 500 MG tablet Take 500 mg by mouth daily.    Marland Kitchen aspirin EC 81 MG tablet Take 81 mg by mouth daily.    Marland Kitchen atorvastatin (LIPITOR) 20 MG tablet Take 20 mg by mouth at bedtime.     . carvedilol (COREG) 3.125 MG tablet Take 1 tablet (3.125 mg total) by mouth 2 (two) times daily with a meal. 60 tablet 3  . Cholecalciferol (VITAMIN D3) 2000 units TABS Take 2,000 Units by mouth daily.    Marland Kitchen dexamethasone (DECADRON) 4 MG tablet Take 8 mg by mouth daily.    . digoxin (LANOXIN) 0.125 MG tablet Take 0.125 mg by mouth daily.    . DULoxetine (CYMBALTA) 30 MG capsule Take 90 mg by mouth at bedtime.     . Ferrous Sulfate (IRON) 325 (65 Fe) MG TABS TAKE 1 TABLET BY MOUTH TWICE DAILY WITH A MEAL 60 tablet 3  . furosemide (LASIX) 20 MG tablet Take 20 mg by mouth 2 (two) times daily.    . hydrOXYzine (ATARAX/VISTARIL) 25 MG tablet Take 25 mg by mouth every 6 (six) hours as needed.    Marland Kitchen levothyroxine (SYNTHROID) 200 MCG tablet Take 1 tablet (200 mcg total) by mouth daily at 6 (six) AM. 30 tablet 1  . magnesium oxide (MAG-OX) 400 MG tablet Take 1 tablet (400 mg total) by mouth daily. 90 tablet 3  . Multiple Vitamins-Minerals (MULTIVITAMIN PO) Take 1 tablet by mouth daily.    . pantoprazole (PROTONIX) 40 MG tablet Take 1 tablet (40 mg total) by mouth at bedtime. 30 tablet 6  . potassium chloride (K-DUR) 10 MEQ tablet Take 10 mEq by mouth 2 (two) times daily.    . sacubitril-valsartan (ENTRESTO) 49-51 MG Take 1 tablet by mouth 2 (two) times daily. 60 tablet 6  . spironolactone (ALDACTONE) 25 MG tablet Take 1 tablet (25 mg total) by mouth daily. 30 tablet 3  . vitamin B-12 (CYANOCOBALAMIN) 1000 MCG  tablet Take 2,000 mcg by mouth daily.     No current facility-administered medications for this encounter.    Allergies  Allergen Reactions  . Codeine Other (See Comments) and Nausea Only    Increased work of breathing, diaphoretic  . Mirtazapine     Other reaction(s): Other - See Comments  . Prednisone     Anxiety and paranoia      Social History   Socioeconomic History  . Marital status:  Widowed    Spouse name: Not on file  . Number of children: Not on file  . Years of education: Not on file  . Highest education level: Not on file  Occupational History  . Not on file  Tobacco Use  . Smoking status: Former Smoker    Packs/day: 1.00    Years: 28.00    Pack years: 28.00    Types: Cigarettes    Start date: 1968    Quit date: 1996    Years since quitting: 25.7  . Smokeless tobacco: Never Used  Vaping Use  . Vaping Use: Never used  Substance and Sexual Activity  . Alcohol use: No  . Drug use: No  . Sexual activity: Not on file  Other Topics Concern  . Not on file  Social History Narrative   Lives in [place]   Attends [employer]   Disabled carpenter   Social Determinants of Health   Financial Resource Strain:   . Difficulty of Paying Living Expenses: Not on file  Food Insecurity:   . Worried About Charity fundraiser in the Last Year: Not on file  . Ran Out of Food in the Last Year: Not on file  Transportation Needs:   . Lack of Transportation (Medical): Not on file  . Lack of Transportation (Non-Medical): Not on file  Physical Activity:   . Days of Exercise per Week: Not on file  . Minutes of Exercise per Session: Not on file  Stress:   . Feeling of Stress : Not on file  Social Connections:   . Frequency of Communication with Friends and Family: Not on file  . Frequency of Social Gatherings with Friends and Family: Not on file  . Attends Religious Services: Not on file  . Active Member of Clubs or Organizations: Not on file  . Attends English as a second language teacher Meetings: Not on file  . Marital Status: Not on file  Intimate Partner Violence:   . Fear of Current or Ex-Partner: Not on file  . Emotionally Abused: Not on file  . Physically Abused: Not on file  . Sexually Abused: Not on file      Family History  Problem Relation Age of Onset  . Diabetes Mother   . Heart disease Mother   . Stroke Mother   . Heart attack Mother   . Heart disease Father   . Heart attack Father   . Stroke Father   . COPD Sister   . Congestive Heart Failure Sister   . Hypertension Sister   . Diabetes Sister   . Hypertension Brother   . Diabetes Sister   . Hypertension Sister   . Diabetes Sister   . Hypertension Sister     Vitals:   05/26/20 0920  BP: 120/70  Pulse: 82  SpO2: 96%  Weight: 90.4 kg (199 lb 4 oz)    Wt Readings from Last 3 Encounters:  05/26/20 90.4 kg (199 lb 4 oz)  05/01/20 88.5 kg (195 lb)  04/18/20 88.5 kg (195 lb)     General:  Elderly appearing. No resp difficulty HEENT: normal Neck: supple. JVP 6  Carotids 2+ bilat; no bruits. No lymphadenopathy or thryomegaly appreciated. Cor: PMI nondisplaced. Irregular rate & rhythm. No rubs, gallops or murmurs. Lungs: clear decreased throughout  Abdomen: soft, nontender, nondistended. No hepatosplenomegaly. No bruits or masses. Good bowel sounds. Extremities: no cyanosis, clubbing, rash, edema Neuro: alert & orientedx3, cranial nerves grossly intact. moves all 4 extremities  w/o difficulty. Affect pleasant   ASSESSMENT & PLAN:  1.Chronic systolic CHF: ICM, EF 43-53% June 2018 s/p MDT ICD - Echo 6/19 reviewed personally. EF 20%. LV markedly dilated and spherical. RV ok - Echo 9/21 EF 20% RV ok   - Stable NYHA III.  - Situation complicated by recurrent AF - Volume status mildly up on lasix 20 bid. Add Farxiga 10 (through [place])  - Continue Entresto to 49/51. BP too soft to titrate - Continue spiro (despite mild gynecomastia - he does not think [facility] will approve Inspra)   - Continue digoxin 0.125 mg Check level - Continue Coreg 3.125 mg BID.  - Refuses VAD - Labs today including digoxin level - May benefit from upgrade to CRT but need to keep out of AF first    2. CAD s/p previous MI - Cath 7/1 with severe 3 v CAD with multiple areas of ISR - s/p 2v PCI 02/22/17 by Dr. Burt Knack. -  Anterior wall is non-viable given previous LAD stent thrombosis. 24-hour Thallium viability study showed minimal or no viability in all segments except for septum - No s/s of ischemia - Off ASA and Brilinta. Continue Eliqiuis 5 mg BID. Denies bleeding - Continue Crestor.   3. VT - ICD shock on 8/21. No recurrence - Keep K > 4.0 Mg > 2.0   4. Persistent AF - CHADSVASC = 3 (CHF, HTN, CAD) - s/p  DC-CV 03/09/18. S/P DC-CV 01/2019  - s/p AF ablation 12/20.  - recurrent AF with DC-CV 9/21 - Continue Eliquis 5 mg BID  - Back in AF today. Start amio 200 daily - Refer to AF Clinic - Check sleep study  5  Chronic hypoxic respiratory failure with h/o left pleural effusion - S/p thoracentesis in 6/18 - No recurrence. Did not de-sat when walked around office 05/04/18.   6. COPD - Per PCP and pulmonary. No wheeze on exam.  - PFTs 05/04/18 FEV1 3.03 (87%), FVC 77%, TLC 6.14 (87%), DLCO 14.4 (44%) - Ambulated in clinic and O2 sat remained > 96% for the entirety of walking.   7. Hepatitis C - Hepatitis C Antibody positive during work up labs. He states he underwent therapy with Harvoni in 2015 through the [place].   - No further testing warranted at this time with Harvoni. Pt states he can bring his certificate that says he is "cured" if needed.   8. LBBB - QRS 194 ms. ? If he would benefit from device upgrade.  - Dr. Rayann Heman has seen and planned to assess for CRT upgrade once AF more stable  9. Hypothyroidism  - long-standing. Previously followed by Dr. Renne Crigler - Now being followed by the [place] - Synthroid adjusted recently   Glori Bickers, MD 05/26/20

## 2020-05-26 NOTE — Progress Notes (Signed)
Height:     Weight: 199lbs BMI: 28.59  Today's Date: 05/26/2020  STOP BANG RISK ASSESSMENT S (snore) Have you been told that you snore?     YES   T (tired) Are you often tired, fatigued, or sleepy during the day?   YES  O (obstruction) Do you stop breathing, choke, or gasp during sleep? NO   P (pressure) Do you have or are you being treated for high blood pressure? YES   B (BMI) Is your body index greater than 35 kg/m? NO   A (age) Are you 66 years old or older? YES   N (neck) Do you have a neck circumference greater than 16 inches?   NO   G (gender) Are you a male? YES   TOTAL STOP/BANG "YES" ANSWERS 5                                                                       For Office Use Only              Procedure Order Form    YES to 3+ Stop Bang questions OR two clinical symptoms - patient qualifies for WatchPAT (CPT 95800)      Clinical Notes: Will consult Sleep Specialist and refer for management of therapy due to patient increased risk of Sleep Apnea. Ordering a sleep study due to the following two clinical symptoms: Excessive daytime sleepiness G47.10 Loud snoring R06.83 /   Which test do you need, WP1 or WP300???  . Do you have access to a smart device containing the app stores?  If YES, then -->WP1   If NO, then  --->WP300

## 2020-05-28 ENCOUNTER — Encounter (HOSPITAL_COMMUNITY): Payer: Self-pay | Admitting: Nurse Practitioner

## 2020-05-28 ENCOUNTER — Ambulatory Visit (HOSPITAL_COMMUNITY)
Admission: RE | Admit: 2020-05-28 | Discharge: 2020-05-28 | Disposition: A | Discharge status: Home / Self Care | Payer: Medicare Other | Source: Ambulatory Visit | Attending: Nurse Practitioner | Admitting: Nurse Practitioner

## 2020-05-28 ENCOUNTER — Other Ambulatory Visit: Payer: Self-pay

## 2020-05-28 VITALS — BP 122/70 | HR 76 | Ht 70.0 in | Wt 201.2 lb

## 2020-05-28 DIAGNOSIS — Z7982 Long term (current) use of aspirin: Secondary | ICD-10-CM | POA: Diagnosis not present

## 2020-05-28 DIAGNOSIS — E039 Hypothyroidism, unspecified: Secondary | ICD-10-CM | POA: Diagnosis not present

## 2020-05-28 DIAGNOSIS — Z955 Presence of coronary angioplasty implant and graft: Secondary | ICD-10-CM | POA: Insufficient documentation

## 2020-05-28 DIAGNOSIS — I2510 Atherosclerotic heart disease of native coronary artery without angina pectoris: Secondary | ICD-10-CM | POA: Insufficient documentation

## 2020-05-28 DIAGNOSIS — Z7901 Long term (current) use of anticoagulants: Secondary | ICD-10-CM | POA: Diagnosis not present

## 2020-05-28 DIAGNOSIS — E785 Hyperlipidemia, unspecified: Secondary | ICD-10-CM | POA: Insufficient documentation

## 2020-05-28 DIAGNOSIS — I4819 Other persistent atrial fibrillation: Secondary | ICD-10-CM | POA: Diagnosis not present

## 2020-05-28 DIAGNOSIS — I255 Ischemic cardiomyopathy: Secondary | ICD-10-CM | POA: Diagnosis not present

## 2020-05-28 DIAGNOSIS — Z87891 Personal history of nicotine dependence: Secondary | ICD-10-CM | POA: Insufficient documentation

## 2020-05-28 DIAGNOSIS — Z79899 Other long term (current) drug therapy: Secondary | ICD-10-CM | POA: Diagnosis not present

## 2020-05-28 DIAGNOSIS — Z79890 Hormone replacement therapy (postmenopausal): Secondary | ICD-10-CM | POA: Diagnosis not present

## 2020-05-28 DIAGNOSIS — Z95810 Presence of automatic (implantable) cardiac defibrillator: Secondary | ICD-10-CM | POA: Diagnosis not present

## 2020-05-28 DIAGNOSIS — I252 Old myocardial infarction: Secondary | ICD-10-CM | POA: Diagnosis not present

## 2020-05-28 DIAGNOSIS — J449 Chronic obstructive pulmonary disease, unspecified: Secondary | ICD-10-CM | POA: Insufficient documentation

## 2020-05-28 DIAGNOSIS — D6869 Other thrombophilia: Secondary | ICD-10-CM | POA: Diagnosis not present

## 2020-05-28 DIAGNOSIS — I110 Hypertensive heart disease with heart failure: Secondary | ICD-10-CM | POA: Diagnosis not present

## 2020-05-28 DIAGNOSIS — I5022 Chronic systolic (congestive) heart failure: Secondary | ICD-10-CM | POA: Insufficient documentation

## 2020-05-28 DIAGNOSIS — I454 Nonspecific intraventricular block: Secondary | ICD-10-CM | POA: Diagnosis not present

## 2020-05-28 DIAGNOSIS — G4733 Obstructive sleep apnea (adult) (pediatric): Secondary | ICD-10-CM | POA: Insufficient documentation

## 2020-05-28 MED ORDER — DAPAGLIFLOZIN PROPANEDIOL 10 MG PO TABS
10.0000 mg | ORAL_TABLET | Freq: Every day | ORAL | 2 refills | Status: DC
Start: 2020-05-28 — End: 2020-06-19

## 2020-05-28 MED ORDER — AMIODARONE HCL 200 MG PO TABS: ORAL_TABLET | ORAL | 0 refills | Status: DC

## 2020-05-28 NOTE — Patient Instructions (Signed)
Start Amiodarone 200mg  twice a day  Follow up in [place] office for EKG next week as scheduled.

## 2020-05-28 NOTE — Progress Notes (Addendum)
Primary Care Physician: [facility] Referring Physician: Dr. Haroldine Laws AHF- Dr. 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 male with a h/o of chronic systolic CHF (EF 46%) due to ischemic cardiomyopathy with a history of MI. AICD, COPD, OSA on CPAP, HTN, PAF, and hypothyroidism. He has a history of multiple MI's. One in 2007 and one in 2014 that resulted in cardiogenic shock and a prolonged ICU hospitalization.    He was seen by Dr. Haroldine Laws 05/26/20 and was found to be in afib. He just  had a cardioversion set up by Dr. Rayann Heman  in September. He had an ablation with Dr. Rayann Heman 07/2019 at which time amiodarone was stopped. Per Dr. Jackalyn Lombard last  Note, if afib returned, he could consider  going back on amiodarone or another ablation. Dr. Haroldine Laws gave pt a Rx for amiodarone on Monday but he has not started yet.   I  discussed with pt again  his options of repeat ablation vrs going back on amiodarone and he still wishes to restart amiodarone. He is in afib, rate controlled  at 76 bpm. He is unaware that he is in afib.  Today, he denies symptoms of palpitations, chest pain, shortness of breath, orthopnea, PND, lower extremity edema, dizziness, presyncope, syncope, or neurologic sequela. The patient is tolerating medications without difficulties and is otherwise without complaint today.   Past Medical History:  Diagnosis Date  . AICD (automatic cardioverter/defibrillator) present 2014  . Atrial fibrillation ([place])   . CAD (coronary artery disease)    S/p multiple prior MIs/PCIs // amdx in 2014 with acute stent thrombosis c/b CGS // Admx to [facility] in 04/6294 2/2 a/c systolic HF >> [facility] 09/24/39: mLAD 95 ISR, dLAD 30, D2 99; OM2 95 ISR; pRCA 30/50, mRCA 90/80, dRCA 30 EF 15 >> min to no viability on Thallium study (not surgical candidate) - PCI>> 3.5 x 20 mm Promus DES to the LCx 3 x 38 mm Promus DES to the proximal mid RCA  . Chronic systolic heart failure (HCC)    2/2 to  ischemic CM - Echo 02/12/17:  Mild LVH, EF 15-20, mod to severe MR, severe LAE, mild RVE, mod reduced RVSF, mild RAE, mod TR, PASP 65  . COPD (chronic obstructive pulmonary disease) ([place])   . History of MI (myocardial infarction)   . History of pneumonia   . HTN (hypertension)   . Hyperlipidemia 03/08/2017  . Hypothyroidism   . OSA (obstructive sleep apnea) 03/08/2017  . Polymorphic ventricular tachycardia ([place]) 04/01/2020   appropriate ICD shock delivered   Past Surgical History:  Procedure Laterality Date  . ATRIAL FIBRILLATION ABLATION N/A 07/24/2019   Procedure: ATRIAL FIBRILLATION ABLATION;  Surgeon: Thompson Grayer, MD;  Location: [place] CV LAB;  Service: Cardiovascular;  Laterality: N/A;  . CARDIAC DEFIBRILLATOR PLACEMENT  2014   Medtronic Every XT DR ICD implanted at the [facility] for primary prevention  . CARDIOVERSION N/A 03/09/2018   Procedure: CARDIOVERSION;  Surgeon: Jolaine Artist, MD;  Location: [facility] ENDOSCOPY;  Service: Cardiovascular;  Laterality: N/A;  . CARDIOVERSION N/A 05/01/2020   Procedure: CARDIOVERSION;  Surgeon: Arnoldo Lenis, MD;  Location: [facility] ENDO SUITE;  Service: Endoscopy;  Laterality: N/A;  . CORONARY STENT INTERVENTION N/A 02/22/2017   Procedure: Coronary Stent Intervention;  Surgeon: Sherren Mocha, MD;  Location: [place] CV LAB;  Service: Cardiovascular;  Laterality: N/A;  CFX and RCA  . FACIAL RECONSTRUCTION SURGERY Right 1984  . FEMUR FRACTURE SURGERY Right  1984  . RIGHT/LEFT HEART CATH AND CORONARY ANGIOGRAPHY N/A 02/14/2017   Procedure: Right/Left Heart Cath and Coronary Angiography;  Surgeon: Jolaine Artist, MD;  Location: [place] CV LAB;  Service: Cardiovascular;  Laterality: N/A;  . WRIST FRACTURE SURGERY Right 2015    Current Outpatient Medications  Medication Sig Dispense Refill  . ALPRAZolam (XANAX) 0.5 MG tablet Take 0.5-1 mg by mouth 3 (three) times daily as needed for anxiety.     Marland Kitchen apixaban (ELIQUIS) 5 MG TABS tablet  Take 1 tablet (5 mg total) by mouth 2 (two) times daily. 180 tablet 3  . ascorbic acid (VITAMIN C) 500 MG tablet Take 500 mg by mouth daily.    Marland Kitchen aspirin EC 81 MG tablet Take 81 mg by mouth daily.    Marland Kitchen atorvastatin (LIPITOR) 20 MG tablet Take 20 mg by mouth at bedtime.     . carvedilol (COREG) 3.125 MG tablet Take 1 tablet (3.125 mg total) by mouth 2 (two) times daily with a meal. 60 tablet 3  . Cholecalciferol (VITAMIN D3) 2000 units TABS Take 2,000 Units by mouth daily.    Marland Kitchen dexamethasone (DECADRON) 4 MG tablet Take 8 mg by mouth daily.    . digoxin (LANOXIN) 0.125 MG tablet Take 0.125 mg by mouth daily.    . DULoxetine (CYMBALTA) 30 MG capsule Take 90 mg by mouth at bedtime.     . Ferrous Sulfate (IRON) 325 (65 Fe) MG TABS TAKE 1 TABLET BY MOUTH TWICE DAILY WITH A MEAL 60 tablet 3  . furosemide (LASIX) 20 MG tablet Take 20 mg by mouth 2 (two) times daily.    . hydrOXYzine (ATARAX/VISTARIL) 25 MG tablet Take 25 mg by mouth every 6 (six) hours as needed.    Marland Kitchen levothyroxine (SYNTHROID) 200 MCG tablet Take 1 tablet (200 mcg total) by mouth daily at 6 (six) AM. 30 tablet 1  . magnesium oxide (MAG-OX) 400 MG tablet Take 1 tablet (400 mg total) by mouth daily. 90 tablet 3  . Multiple Vitamins-Minerals (MULTIVITAMIN PO) Take 1 tablet by mouth daily.    . pantoprazole (PROTONIX) 40 MG tablet Take 1 tablet (40 mg total) by mouth at bedtime. 30 tablet 6  . potassium chloride (K-DUR) 10 MEQ tablet Take 10 mEq by mouth 2 (two) times daily.    . sacubitril-valsartan (ENTRESTO) 49-51 MG Take 1 tablet by mouth 2 (two) times daily. 60 tablet 6  . spironolactone (ALDACTONE) 25 MG tablet Take 1 tablet (25 mg total) by mouth daily. 30 tablet 3  . vitamin B-12 (CYANOCOBALAMIN) 1000 MCG tablet Take 2,000 mcg by mouth daily.    Marland Kitchen amiodarone (PACERONE) 200 MG tablet Take 1 tablet twice a day for 1 month then reduce to 1 tablet daily 60 tablet 0  . dapagliflozin propanediol (FARXIGA) 10 MG TABS tablet Take 1 tablet  (10 mg total) by mouth daily. 30 tablet 2   No current facility-administered medications for this encounter.    Allergies  Allergen Reactions  . Codeine Other (See Comments) and Nausea Only    Increased work of breathing, diaphoretic  . Mirtazapine     Other reaction(s): Other - See Comments  . Prednisone     Anxiety and paranoia    Social History   Socioeconomic History  . Marital status: Widowed    Spouse name: Not on file  . Number of children: Not on file  . Years of education: Not on file  . Highest education level: Not on file  Occupational History  . Not on file  Tobacco Use  . Smoking status: Former Smoker    Packs/day: 1.00    Years: 28.00    Pack years: 28.00    Types: Cigarettes    Start date: 1968    Quit date: 1996    Years since quitting: 25.8  . Smokeless tobacco: Never Used  Vaping Use  . Vaping Use: Never used  Substance and Sexual Activity  . Alcohol use: No  . Drug use: No  . Sexual activity: Not on file  Other Topics Concern  . Not on file  Social History Narrative   Lives in [place]   Attends [employer]   Disabled carpenter   Social Determinants of Health   Financial Resource Strain:   . Difficulty of Paying Living Expenses: Not on file  Food Insecurity:   . Worried About Charity fundraiser in the Last Year: Not on file  . Ran Out of Food in the Last Year: Not on file  Transportation Needs:   . Lack of Transportation (Medical): Not on file  . Lack of Transportation (Non-Medical): Not on file  Physical Activity:   . Days of Exercise per Week: Not on file  . Minutes of Exercise per Session: Not on file  Stress:   . Feeling of Stress : Not on file  Social Connections:   . Frequency of Communication with Friends and Family: Not on file  . Frequency of Social Gatherings with Friends and Family: Not on file  . Attends Religious Services: Not on file  . Active Member of Clubs or Organizations: Not on file  . Attends English as a second language teacher Meetings: Not on file  . Marital Status: Not on file  Intimate Partner Violence:   . Fear of Current or Ex-Partner: Not on file  . Emotionally Abused: Not on file  . Physically Abused: Not on file  . Sexually Abused: Not on file    Family History  Problem Relation Age of Onset  . Diabetes Mother   . Heart disease Mother   . Stroke Mother   . Heart attack Mother   . Heart disease Father   . Heart attack Father   . Stroke Father   . COPD Sister   . Congestive Heart Failure Sister   . Hypertension Sister   . Diabetes Sister   . Hypertension Brother   . Diabetes Sister   . Hypertension Sister   . Diabetes Sister   . Hypertension Sister     ROS- All systems are reviewed and negative except as per the HPI above  Physical Exam: Vitals:   05/28/20 0924  BP: 122/70  Pulse: 76  Weight: 91.3 kg  Height: 5\' 10"  (1.778 m)   Wt Readings from Last 3 Encounters:  05/28/20 91.3 kg  05/26/20 90.4 kg  05/01/20 88.5 kg    Labs: Lab Results  Component Value Date   NA 137 05/26/2020   K 4.4 05/26/2020   CL 101 05/26/2020   CO2 26 05/26/2020   GLUCOSE 192 (H) 05/26/2020   BUN 14 05/26/2020   CREATININE 1.12 05/26/2020   CALCIUM 9.5 05/26/2020   PHOS 4.4 02/09/2017   MG 2.2 12/01/2018   Lab Results  Component Value Date   INR 1.25 05/04/2018   Lab Results  Component Value Date   CHOL 179 05/04/2018   HDL 38 (L) 05/04/2018   LDLCALC 99 05/04/2018   TRIG 212 (H) 05/04/2018  GEN- The patient is well appearing, alert and oriented x 3 today.   Head- normocephalic, atraumatic Eyes-  Sclera clear, conjunctiva pink Ears- hearing intact Oropharynx- clear Neck- supple, no JVP Lymph- no cervical lymphadenopathy Lungs- Clear to ausculation bilaterally, normal work of breathing Heart- irregular rate and rhythm, no murmurs, rubs or gallops, PMI not laterally displaced GI- soft, NT, ND, + BS Extremities- no clubbing, cyanosis, or edema MS- no significant  deformity or atrophy Skin- no rash or lesion Psych- euthymic mood, full affect Neuro- strength and sensation are intact  EKG-afib at 76 bpm, LAD, NSIV block     Assessment and Plan: 1. Persistent  afib  ERAF after cardioversion 9/16 With pt's h/o of heart failure. LV dysfunction, it is important to return him  to SR He wishes to restart amiodarone Will reload at 200 mg bid  An EKG will be arranged in the [place] office after one week since he lives in [place], to be faxed here  I will see back in 3 weeks at which  time DCCV will be scheduled  2. CHA2DS2VASc score of 4 Continue eliquis 5 mg bid   3. HTN Stable   4. CHF Weight stable   5. CAD No anginal symptoms   Geroge Baseman. Marbeth Smedley, [facility] [address] [place], [place] [address] [phone]

## 2020-06-05 ENCOUNTER — Ambulatory Visit: Payer: Medicare Other

## 2020-06-10 ENCOUNTER — Ambulatory Visit (INDEPENDENT_AMBULATORY_CARE_PROVIDER_SITE_OTHER): Payer: Medicare Other

## 2020-06-10 ENCOUNTER — Telehealth: Payer: Self-pay

## 2020-06-10 DIAGNOSIS — I5022 Chronic systolic (congestive) heart failure: Secondary | ICD-10-CM

## 2020-06-10 DIAGNOSIS — Z95810 Presence of automatic (implantable) cardiac defibrillator: Secondary | ICD-10-CM

## 2020-06-10 NOTE — Telephone Encounter (Signed)
Remote ICM transmission received.  Attempted call to patient regarding ICM remote transmission and left detailed message per DPR.  Advised to return call for any fluid symptoms or questions.  

## 2020-06-10 NOTE — Progress Notes (Signed)
EPIC Encounter for ICM Monitoring  Patient Name: Stephen Cardenas is a 64 y.o. male Date: 06/10/2020 Primary Care Physican: [facility], [facility] Primary Cardiologist:Bensimhon Electrophysiologist:Allred 04/29/2020 Weight:195lbs  Clinical Status (29-Apr-2020 to 12-May-2020) AT/AF1 episode Time in AT/AF15.7 hr/day (65.6%) Longest KQ/AS60 days  Attempted return call to patient and after he left message stating he sent remote transmission for review because he was not feeling well.  Left detailed message per DPR regarding transmission. Transmission reviewed.   Optivol thoracic impedancesuggesting fluid levels have returned to normal.  Prescribed:  Furosemide20 mgtake1tablet(20 mg total)twice a day.Per patient, he self adjusts Furosemide and Potassium when needed with Dr Bensimhon's approval.  Potassium10 mEq take1tablettwice a day.  Spironolactone 25 mg take 1 tablet daily  Eliquis 5 mg take 1 tablet twice a day  Labs: 10/08/2019 Creatinine 1.35, BUN 14, Potassium 3.8, Sodium 137, GFR 55->60 07/20/2019 Creatinine 1.28, BUN 20, Potassium 4.6, Sodium 138, GFR 58-67 A complete set of results can be found in Results Review.  Recommendations: Left voice mail with ICM number and encouraged to call if experiencing any fluid symptoms.  Follow-up plan: ICM clinic phone appointment on11/08/2019.91 day device clinic remote transmission12/14/2021.  EP/Cardiology Office Visits:05/26/2020 with [place].   Copy of ICM check sent to Dr.Allred and Dr Haroldine Laws for review and recommendations if needed.   3 month ICM trend: 06/10/2020    1 Year ICM trend:       [name], RN 06/10/2020 10:06 AM

## 2020-06-11 ENCOUNTER — Ambulatory Visit (INDEPENDENT_AMBULATORY_CARE_PROVIDER_SITE_OTHER): Payer: Medicare Other | Admitting: *Deleted

## 2020-06-11 DIAGNOSIS — I4819 Other persistent atrial fibrillation: Secondary | ICD-10-CM | POA: Diagnosis not present

## 2020-06-11 NOTE — Progress Notes (Signed)
Presents for EKG at request of Roderic Palau. EKG done and routed to provider.

## 2020-06-12 ENCOUNTER — Ambulatory Visit (HOSPITAL_COMMUNITY): Payer: Medicare Other | Admitting: Nurse Practitioner

## 2020-06-13 ENCOUNTER — Telehealth: Payer: Self-pay | Admitting: Nurse Practitioner

## 2020-06-13 NOTE — Telephone Encounter (Signed)
EKG done yesterday will forward to provider

## 2020-06-13 NOTE — Telephone Encounter (Signed)
Patient called requesting results of recent EKG.

## 2020-06-16 ENCOUNTER — Ambulatory Visit (INDEPENDENT_AMBULATORY_CARE_PROVIDER_SITE_OTHER): Payer: Medicare Other

## 2020-06-16 DIAGNOSIS — I5022 Chronic systolic (congestive) heart failure: Secondary | ICD-10-CM | POA: Diagnosis not present

## 2020-06-16 DIAGNOSIS — Z95810 Presence of automatic (implantable) cardiac defibrillator: Secondary | ICD-10-CM

## 2020-06-17 ENCOUNTER — Encounter (HOSPITAL_COMMUNITY): Payer: Self-pay

## 2020-06-17 NOTE — Telephone Encounter (Signed)
Patient notified his EKG shows rate controlled afib per Ceasar Lund. Consulted with patient regarding his Amiodarone medication. He has only been taking this medication once daily for the past 4-5 days. Patient instructed to take this medication twice daily. He is scheduled for appointment on 11/4 with Ceasar Lund. Patient verbalized understanding.

## 2020-06-18 NOTE — Progress Notes (Signed)
EPIC Encounter for ICM Monitoring  Patient Name: Stephen Cardenas is a 64 y.o. male Date: 06/18/2020 Primary Care Physican: [facility], [facility] Primary Cardiologist:Bensimhon Electrophysiologist:Allred 06/18/2020 Weight:183lbs  Clinical Status (10-Jun-2020 to 16-Jun-2020) Time in AT/AF 24.0 hr/day (100.0%) Longest AT/AF 41 days  Spoke with patient and reports feeling well at this time.  Denies fluid symptoms.    Optivol thoracic impedancesuggesting possible fluid accumulation 05/24/2020 but trending back close to baseline at time of report.  Prescribed:  Furosemide20 mgtake1tablet(20 mg total)twice a day.Per patient, he self adjusts Furosemide and Potassium when needed with Dr Bensimhon's approval.  Potassium10 mEq take1tablettwice a day.  Spironolactone 25 mg take 1 tablet daily  Eliquis 5 mg take 1 tablet twice a day  Labs: 05/26/2020 Creatinine 1.12, BUN 14, Potassium 4.4, Sodium 137, GFR >60 10/08/2019 Creatinine 1.35, BUN 14, Potassium 3.8, Sodium 137, GFR 55->60 07/20/2019 Creatinine 1.28, BUN 20, Potassium 4.6, Sodium 138, GFR 58-67 A complete set of results can be found in Results Review.  Recommendations: He will adjust Furosemide and Potassium and he reports Dr Haroldine Laws is aware.   Follow-up plan: ICM clinic phone appointment on11/16/2021 to recheck fluid levels.91 day device clinic remote transmission12/14/2021.  EP/Cardiology Office Visits:07/28/2020 with Dr.Bensimhon. 08/22/2020 with Dr Rayann Heman  Copy of ICM check sent to Dr.Allredand Dr Haroldine Laws for review and recommendations if needed.  3 month ICM trend: 06/16/2020    1 Year ICM trend:       [name], RN 06/18/2020 3:55 PM

## 2020-06-19 ENCOUNTER — Encounter (HOSPITAL_COMMUNITY): Payer: Self-pay | Admitting: Nurse Practitioner

## 2020-06-19 ENCOUNTER — Ambulatory Visit (HOSPITAL_COMMUNITY)
Admission: RE | Admit: 2020-06-19 | Discharge: 2020-06-19 | Disposition: A | Discharge status: Home / Self Care | Payer: Medicare Other | Source: Ambulatory Visit | Attending: Nurse Practitioner | Admitting: Nurse Practitioner

## 2020-06-19 ENCOUNTER — Other Ambulatory Visit: Payer: Self-pay

## 2020-06-19 VITALS — BP 108/72 | HR 73 | Ht 70.0 in | Wt 190.0 lb

## 2020-06-19 DIAGNOSIS — Z87891 Personal history of nicotine dependence: Secondary | ICD-10-CM | POA: Diagnosis not present

## 2020-06-19 DIAGNOSIS — D6869 Other thrombophilia: Secondary | ICD-10-CM

## 2020-06-19 DIAGNOSIS — I454 Nonspecific intraventricular block: Secondary | ICD-10-CM | POA: Insufficient documentation

## 2020-06-19 DIAGNOSIS — Z79899 Other long term (current) drug therapy: Secondary | ICD-10-CM | POA: Diagnosis not present

## 2020-06-19 DIAGNOSIS — Z95810 Presence of automatic (implantable) cardiac defibrillator: Secondary | ICD-10-CM | POA: Insufficient documentation

## 2020-06-19 DIAGNOSIS — I255 Ischemic cardiomyopathy: Secondary | ICD-10-CM | POA: Insufficient documentation

## 2020-06-19 DIAGNOSIS — I5022 Chronic systolic (congestive) heart failure: Secondary | ICD-10-CM | POA: Insufficient documentation

## 2020-06-19 DIAGNOSIS — Z7982 Long term (current) use of aspirin: Secondary | ICD-10-CM | POA: Insufficient documentation

## 2020-06-19 DIAGNOSIS — I4819 Other persistent atrial fibrillation: Secondary | ICD-10-CM | POA: Diagnosis not present

## 2020-06-19 DIAGNOSIS — I4891 Unspecified atrial fibrillation: Secondary | ICD-10-CM | POA: Diagnosis present

## 2020-06-19 DIAGNOSIS — I110 Hypertensive heart disease with heart failure: Secondary | ICD-10-CM | POA: Insufficient documentation

## 2020-06-19 DIAGNOSIS — I2510 Atherosclerotic heart disease of native coronary artery without angina pectoris: Secondary | ICD-10-CM | POA: Diagnosis not present

## 2020-06-19 DIAGNOSIS — Z7901 Long term (current) use of anticoagulants: Secondary | ICD-10-CM | POA: Insufficient documentation

## 2020-06-19 LAB — BASIC METABOLIC PANEL
Anion gap: 10 (ref 5–15)
BUN: 13 mg/dL (ref 8–23)
CO2: 23 mmol/L (ref 22–32)
Calcium: 8.9 mg/dL (ref 8.9–10.3)
Chloride: 104 mmol/L (ref 98–111)
Creatinine, Ser: 1.17 mg/dL (ref 0.61–1.24)
GFR, Estimated: >60 mL/min (ref >60)
Glucose, Bld: 130 mg/dL — ABNORMAL HIGH (ref 70–99)
Potassium: 4.7 mmol/L (ref 3.5–5.1)
Sodium: 137 mmol/L (ref 135–145)

## 2020-06-19 LAB — CBC
HCT: 36.2 % — ABNORMAL LOW (ref 39.0–52.0)
Hemoglobin: 11.4 g/dL — ABNORMAL LOW (ref 13.0–17.0)
MCH: 30.9 pg (ref 26.0–34.0)
MCHC: 31.5 g/dL (ref 30.0–36.0)
MCV: 98.1 fL (ref 80.0–100.0)
Platelets: 492 10*3/uL — ABNORMAL HIGH (ref 150–400)
RBC: 3.69 MIL/uL — ABNORMAL LOW (ref 4.22–5.81)
RDW: 14 % (ref 11.5–15.5)
WBC: 6.6 10*3/uL (ref 4.0–10.5)
nRBC: 0 % (ref 0.0–0.2)

## 2020-06-19 NOTE — H&P (View-Only) (Signed)
Primary Care Physician: Administration, [facility] Referring Physician: Dr. Haroldine Laws AHF- Dr. 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 male with a h/o of chronic systolic CHF (EF 23%) due to ischemic cardiomyopathy with a history of MI. AICD, COPD, OSA on CPAP, HTN, PAF, and hypothyroidism. He has a history of multiple MI's. One in 2007 and one in 2014 that resulted in cardiogenic shock and a prolonged ICU hospitalization.    He was seen by Dr. Haroldine Laws 05/26/20 and was found to be in afib. He just  had a cardioversion set up by Dr. Rayann Heman  in September. He had an ablation with Dr. Rayann Heman 07/2019 at which time amiodarone was stopped. Per Dr. Jackalyn Lombard last  Note, if afib returned, he could consider  going back on amiodarone or another ablation. Dr. Haroldine Laws gave pt a Rx for amiodarone on Monday but he has not started yet.   I  discussed with pt again  his options of repeat ablation vrs going back on amiodarone and he still wishes to restart amiodarone. He is in afib, rate controlled  at 76 bpm. He is unaware that he is in afib.  F/u in afib clinic, 06/19/20. He is in the afib clinic  for f/u amiodarone loading. He is on 200 mg bid. EKG shows afib at 73 bpm, qrs int 184 ms, qtc 577 ms he will be scheduled for cardioversion 11/15. He has not missed any anticoagulation and he has had his covid vaccines.    Today, he denies symptoms of palpitations, chest pain, shortness of breath, orthopnea, PND, lower extremity edema, dizziness, presyncope, syncope, or neurologic sequela. The patient is tolerating medications without difficulties and is otherwise without complaint today.   Past Medical History:  Diagnosis Date  . AICD (automatic cardioverter/defibrillator) present 2014  . Atrial fibrillation ([place])   . CAD (coronary artery disease)    S/p multiple prior MIs/PCIs // amdx in 2014 with acute stent thrombosis c/b CGS // Admx to [facility] in 12/5730 2/2 a/c systolic HF >> [facility] 2/0/25: mLAD 95  ISR, dLAD 30, D2 99; OM2 95 ISR; pRCA 30/50, mRCA 90/80, dRCA 30 EF 15 >> min to no viability on Thallium study (not surgical candidate) - PCI>> 3.5 x 20 mm Promus DES to the LCx 3 x 38 mm Promus DES to the proximal mid RCA  . Chronic systolic heart failure (H[facility])    2/2 to ischemic CM - Echo 02/12/17:  Mild LVH, EF 15-20, mod to severe MR, severe LAE, mild RVE, mod reduced RVSF, mild RAE, mod TR, PASP 65  . COPD (chronic obstructive pulmonary disease) ([place])   . History of MI (myocardial infarction)   . History of pneumonia   . HTN (hypertension)   . Hyperlipidemia 03/08/2017  . Hypothyroidism   . OSA (obstructive sleep apnea) 03/08/2017  . Polymorphic ventricular tachycardia ([place]) 04/01/2020   appropriate ICD shock delivered   Past Surgical History:  Procedure Laterality Date  . ATRIAL FIBRILLATION ABLATION N/A 07/24/2019   Procedure: ATRIAL FIBRILLATION ABLATION;  Surgeon: Thompson Grayer, MD;  Location: [place] [facility];  Service: Cardiovascular;  Laterality: N/A;  . CARDIAC DEFIBRILLATOR PLACEMENT  2014   Medtronic Every XT DR ICD implanted at the [facility] for primary prevention  . CARDIOVERSION N/A 03/09/2018   Procedure: CARDIOVERSION;  Surgeon: Jolaine Artist, MD;  Location: [facility] ENDOSCOPY;  Service: Cardiovascular;  Laterality: N/A;  . CARDIOVERSION N/A 05/01/2020   Procedure: CARDIOVERSION;  Surgeon: Arnoldo Lenis, MD;  Location: [facility];  Service: Endoscopy;  Laterality: N/A;  . CORONARY STENT INTERVENTION N/A 02/22/2017   Procedure: Coronary Stent Intervention;  Surgeon: Sherren Mocha, MD;  Location: [place] [facility];  Service: Cardiovascular;  Laterality: N/A;  CFX and RCA  . FACIAL RECONSTRUCTION SURGERY Right 1984  . FEMUR FRACTURE SURGERY Right 1984  . RIGHT/LEFT HEART CATH AND CORONARY ANGIOGRAPHY N/A 02/14/2017   Procedure: Right/Left Heart Cath and Coronary Angiography;  Surgeon: Jolaine Artist, MD;  Location: [facility];  Service: Cardiovascular;   Laterality: N/A;  . WRIST FRACTURE SURGERY Right 2015    Current Outpatient Medications  Medication Sig Dispense Refill  . ALPRAZolam (XANAX) 0.5 MG tablet Take 0.5-1 mg by mouth 3 (three) times daily as needed for anxiety.     Marland Kitchen amiodarone (PACERONE) 200 MG tablet Take 1 tablet twice a day for 1 month then reduce to 1 tablet daily 60 tablet 0  . apixaban (ELIQUIS) 5 MG TABS tablet Take 1 tablet (5 mg total) by mouth 2 (two) times daily. 180 tablet 3  . ascorbic acid (VITAMIN C) 500 MG tablet Take 500 mg by mouth daily.    Marland Kitchen aspirin EC 81 MG tablet Take 81 mg by mouth daily.    Marland Kitchen atorvastatin (LIPITOR) 20 MG tablet Take 20 mg by mouth at bedtime.     . carvedilol (COREG) 3.125 MG tablet Take 1 tablet (3.125 mg total) by mouth 2 (two) times daily with a meal. 60 tablet 3  . DULoxetine (CYMBALTA) 30 MG capsule Take 90 mg by mouth at bedtime.     . Ferrous Sulfate (IRON) 325 (65 Fe) MG TABS TAKE 1 TABLET BY MOUTH TWICE DAILY WITH A MEAL 60 tablet 3  . furosemide (LASIX) 20 MG tablet Take 20 mg by mouth 2 (two) times daily.    Marland Kitchen levothyroxine (SYNTHROID) 200 MCG tablet Take 1 tablet (200 mcg total) by mouth daily at 6 (six) AM. 30 tablet 1  . magnesium oxide (MAG-OX) 400 MG tablet Take 1 tablet (400 mg total) by mouth daily. 90 tablet 3  . Multiple Vitamins-Minerals (MULTIVITAMIN PO) Take 1 tablet by mouth daily.    . pantoprazole (PROTONIX) 40 MG tablet Take 1 tablet (40 mg total) by mouth at bedtime. 30 tablet 6  . potassium chloride (K-DUR) 10 MEQ tablet Take 10 mEq by mouth 2 (two) times daily.    . sacubitril-valsartan (ENTRESTO) 49-51 MG Take 1 tablet by mouth 2 (two) times daily. 60 tablet 6  . spironolactone (ALDACTONE) 25 MG tablet Take 1 tablet (25 mg total) by mouth daily. 30 tablet 3  . vitamin B-12 (CYANOCOBALAMIN) 1000 MCG tablet Take 2,000 mcg by mouth daily.    . Cholecalciferol (VITAMIN D3) 2000 units TABS Take 2,000 Units by mouth daily.    . digoxin (LANOXIN) 0.125 MG tablet  Take 0.125 mg by mouth daily. (Patient not taking: Reported on 06/19/2020)     No current facility-administered medications for this encounter.    Allergies  Allergen Reactions  . Codeine Other (See Comments) and Nausea Only    Increased work of breathing, diaphoretic  . Mirtazapine     Other reaction(s): Other - See Comments  . Prednisone     Anxiety and paranoia    Social History   Socioeconomic History  . Marital status: Widowed    Spouse name: Not on file  . Number of children: Not on file  . Years of education: Not on file  .  Highest education level: Not on file  Occupational History  . Not on file  Tobacco Use  . Smoking status: Former Smoker    Packs/day: 1.00    Years: 28.00    Pack years: 28.00    Types: Cigarettes    Start date: 1968    Quit date: 1996    Years since quitting: 25.8  . Smokeless tobacco: Never Used  Vaping Use  . Vaping Use: Never used  Substance and Sexual Activity  . Alcohol use: No  . Drug use: No  . Sexual activity: Not on file  Other Topics Concern  . Not on file  Social History Narrative   Lives in [place]   Attends [employer]   Disabled carpenter   Social Determinants of Health   Financial Resource Strain:   . Difficulty of Paying Living Expenses: Not on file  Food Insecurity:   . Worried About Charity fundraiser in the Last Year: Not on file  . Ran Out of Food in the Last Year: Not on file  Transportation Needs:   . Lack of Transportation (Medical): Not on file  . Lack of Transportation (Non-Medical): Not on file  Physical Activity:   . Days of Exercise per Week: Not on file  . Minutes of Exercise per Session: Not on file  Stress:   . Feeling of Stress : Not on file  Social Connections:   . Frequency of Communication with Friends and Family: Not on file  . Frequency of Social Gatherings with Friends and Family: Not on file  . Attends Religious Services: Not on file  . Active Member of Clubs or  Organizations: Not on file  . Attends Archivist Meetings: Not on file  . Marital Status: Not on file  Intimate Partner Violence:   . Fear of Current or Ex-Partner: Not on file  . Emotionally Abused: Not on file  . Physically Abused: Not on file  . Sexually Abused: Not on file    Family History  Problem Relation Age of Onset  . Diabetes Mother   . Heart disease Mother   . Stroke Mother   . Heart attack Mother   . Heart disease Father   . Heart attack Father   . Stroke Father   . COPD Sister   . Congestive Heart Failure Sister   . Hypertension Sister   . Diabetes Sister   . Hypertension Brother   . Diabetes Sister   . Hypertension Sister   . Diabetes Sister   . Hypertension Sister     ROS- All systems are reviewed and negative except as per the HPI above  Physical Exam: Vitals:   06/19/20 0907  BP: 108/72  Pulse: 73  Weight: 86.2 kg  Height: 5\' 10"  (1.778 m)   Wt Readings from Last 3 Encounters:  06/19/20 86.2 kg  05/28/20 91.3 kg  05/26/20 90.4 kg    Labs: Lab Results  Component Value Date   NA 137 05/26/2020   K 4.4 05/26/2020   CL 101 05/26/2020   CO2 26 05/26/2020   GLUCOSE 192 (H) 05/26/2020   BUN 14 05/26/2020   CREATININE 1.12 05/26/2020   CALCIUM 9.5 05/26/2020   PHOS 4.4 02/09/2017   MG 2.2 12/01/2018   Lab Results  Component Value Date   INR 1.25 05/04/2018   Lab Results  Component Value Date   CHOL 179 05/04/2018   HDL 38 (L) 05/04/2018   LDLCALC 99 05/04/2018  TRIG 212 (H) 05/04/2018     GEN- The patient is well appearing, alert and oriented x 3 today.   Head- normocephalic, atraumatic Eyes-  Sclera clear, conjunctiva pink Ears- hearing intact Oropharynx- clear Neck- supple, no JVP Lymph- no cervical lymphadenopathy Lungs- Clear to ausculation bilaterally, normal work of breathing Heart- irregular rate and rhythm, no murmurs, rubs or gallops, PMI not laterally displaced GI- soft, NT, ND, + BS Extremities- no  clubbing, cyanosis, or edema MS- no significant deformity or atrophy Skin- no rash or lesion Psych- euthymic mood, full affect Neuro- strength and sensation are intact  EKG-afib at 73 bpm,NSIV block, qrs int 184 ms, qtc 577 ms     Assessment and Plan: 1. Persistent  afib  ERAF after cardioversion 9/16 With pt's h/o of heart failure. LV dysfunction, it is important to return him  to SR He wishes to restart amiodarone Will reload at 200 mg bid  He will be scheduled for cardioversion 11/15 He will continue amiodarone at 200 mg bid until I see him back one week after cardioversion  Cbc/bmet/covid   2. CHA2DS2VASc score of 4 Continue eliquis 5 mg bid States no missed anticoagulation for at least 3 weeks    3. HTN Stable   4. CHF Normovolemic   5. CAD No anginal symptoms   Geroge Baseman. Gearld Kerstein, [facility] [address] [place], [place] [address] [phone]

## 2020-06-19 NOTE — Progress Notes (Signed)
Primary Care Physician: [facility], [facility] Referring Physician: Dr. Haroldine Laws AHF- Dr. 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 male with a h/o of chronic systolic CHF (EF 53%) due to ischemic cardiomyopathy with a history of MI. AICD, COPD, OSA on CPAP, HTN, PAF, and hypothyroidism. He has a history of multiple MI's. One in 2007 and one in 2014 that resulted in cardiogenic shock and a prolonged ICU hospitalization.    He was seen by Dr. Haroldine Laws 05/26/20 and was found to be in afib. He just  had a cardioversion set up by Dr. Rayann Heman  in September. He had an ablation with Dr. Rayann Heman 07/2019 at which time amiodarone was stopped. Per Dr. Jackalyn Lombard last  Note, if afib returned, he could consider  going back on amiodarone or another ablation. Dr. Haroldine Laws gave pt a Rx for amiodarone on Monday but he has not started yet.   I  discussed with pt again  his options of repeat ablation vrs going back on amiodarone and he still wishes to restart amiodarone. He is in afib, rate controlled  at 76 bpm. He is unaware that he is in afib.  F/u in afib clinic, 06/19/20. He is in the afib clinic  for f/u amiodarone loading. He is on 200 mg bid. EKG shows afib at 73 bpm, qrs int 184 ms, qtc 577 ms he will be scheduled for cardioversion 11/15. He has not missed any anticoagulation and he has had his covid vaccines.    Today, he denies symptoms of palpitations, chest pain, shortness of breath, orthopnea, PND, lower extremity edema, dizziness, presyncope, syncope, or neurologic sequela. The patient is tolerating medications without difficulties and is otherwise without complaint today.   Past Medical History:  Diagnosis Date  . AICD (automatic cardioverter/defibrillator) present 2014  . Atrial fibrillation ([place])   . CAD (coronary artery disease)    S/p multiple prior MIs/PCIs // amdx in 2014 with acute stent thrombosis c/b CGS // Admx to [facility] in 04/7672 2/2 a/c systolic HF >> [facility] 11/15/91: mLAD 95  ISR, dLAD 30, D2 99; OM2 95 ISR; pRCA 30/50, mRCA 90/80, dRCA 30 EF 15 >> min to no viability on Thallium study (not surgical candidate) - PCI>> 3.5 x 20 mm Promus DES to the LCx 3 x 38 mm Promus DES to the proximal mid RCA  . Chronic systolic heart failure (HCC)    2/2 to ischemic CM - Echo 02/12/17:  Mild LVH, EF 15-20, mod to severe MR, severe LAE, mild RVE, mod reduced RVSF, mild RAE, mod TR, PASP 65  . COPD (chronic obstructive pulmonary disease) ([place])   . History of MI (myocardial infarction)   . History of pneumonia   . HTN (hypertension)   . Hyperlipidemia 03/08/2017  . Hypothyroidism   . OSA (obstructive sleep apnea) 03/08/2017  . Polymorphic ventricular tachycardia ([place]) 04/01/2020   appropriate ICD shock delivered   Past Surgical History:  Procedure Laterality Date  . ATRIAL FIBRILLATION ABLATION N/A 07/24/2019   Procedure: ATRIAL FIBRILLATION ABLATION;  Surgeon: Thompson Grayer, MD;  Location: [place] CV LAB;  Service: Cardiovascular;  Laterality: N/A;  . CARDIAC DEFIBRILLATOR PLACEMENT  2014   Medtronic Every XT DR ICD implanted at the [facility] for primary prevention  . CARDIOVERSION N/A 03/09/2018   Procedure: CARDIOVERSION;  Surgeon: Jolaine Artist, MD;  Location: [facility] ENDOSCOPY;  Service: Cardiovascular;  Laterality: N/A;  . CARDIOVERSION N/A 05/01/2020   Procedure: CARDIOVERSION;  Surgeon: Arnoldo Lenis, MD;  Location: [facility];  Service: Endoscopy;  Laterality: N/A;  . CORONARY STENT INTERVENTION N/A 02/22/2017   Procedure: Coronary Stent Intervention;  Surgeon: Sherren Mocha, MD;  Location: [place] CV LAB;  Service: Cardiovascular;  Laterality: N/A;  CFX and RCA  . FACIAL RECONSTRUCTION SURGERY Right 1984  . FEMUR FRACTURE SURGERY Right 1984  . RIGHT/LEFT HEART CATH AND CORONARY ANGIOGRAPHY N/A 02/14/2017   Procedure: Right/Left Heart Cath and Coronary Angiography;  Surgeon: Jolaine Artist, MD;  Location: [place] CV LAB;  Service: Cardiovascular;   Laterality: N/A;  . WRIST FRACTURE SURGERY Right 2015    Current Outpatient Medications  Medication Sig Dispense Refill  . ALPRAZolam (XANAX) 0.5 MG tablet Take 0.5-1 mg by mouth 3 (three) times daily as needed for anxiety.     Marland Kitchen amiodarone (PACERONE) 200 MG tablet Take 1 tablet twice a day for 1 month then reduce to 1 tablet daily 60 tablet 0  . apixaban (ELIQUIS) 5 MG TABS tablet Take 1 tablet (5 mg total) by mouth 2 (two) times daily. 180 tablet 3  . ascorbic acid (VITAMIN C) 500 MG tablet Take 500 mg by mouth daily.    Marland Kitchen aspirin EC 81 MG tablet Take 81 mg by mouth daily.    Marland Kitchen atorvastatin (LIPITOR) 20 MG tablet Take 20 mg by mouth at bedtime.     . carvedilol (COREG) 3.125 MG tablet Take 1 tablet (3.125 mg total) by mouth 2 (two) times daily with a meal. 60 tablet 3  . DULoxetine (CYMBALTA) 30 MG capsule Take 90 mg by mouth at bedtime.     . Ferrous Sulfate (IRON) 325 (65 Fe) MG TABS TAKE 1 TABLET BY MOUTH TWICE DAILY WITH A MEAL 60 tablet 3  . furosemide (LASIX) 20 MG tablet Take 20 mg by mouth 2 (two) times daily.    Marland Kitchen levothyroxine (SYNTHROID) 200 MCG tablet Take 1 tablet (200 mcg total) by mouth daily at 6 (six) AM. 30 tablet 1  . magnesium oxide (MAG-OX) 400 MG tablet Take 1 tablet (400 mg total) by mouth daily. 90 tablet 3  . Multiple Vitamins-Minerals (MULTIVITAMIN PO) Take 1 tablet by mouth daily.    . pantoprazole (PROTONIX) 40 MG tablet Take 1 tablet (40 mg total) by mouth at bedtime. 30 tablet 6  . potassium chloride (K-DUR) 10 MEQ tablet Take 10 mEq by mouth 2 (two) times daily.    . sacubitril-valsartan (ENTRESTO) 49-51 MG Take 1 tablet by mouth 2 (two) times daily. 60 tablet 6  . spironolactone (ALDACTONE) 25 MG tablet Take 1 tablet (25 mg total) by mouth daily. 30 tablet 3  . vitamin B-12 (CYANOCOBALAMIN) 1000 MCG tablet Take 2,000 mcg by mouth daily.    . Cholecalciferol (VITAMIN D3) 2000 units TABS Take 2,000 Units by mouth daily.    . digoxin (LANOXIN) 0.125 MG tablet  Take 0.125 mg by mouth daily. (Patient not taking: Reported on 06/19/2020)     No current facility-administered medications for this encounter.    Allergies  Allergen Reactions  . Codeine Other (See Comments) and Nausea Only    Increased work of breathing, diaphoretic  . Mirtazapine     Other reaction(s): Other - See Comments  . Prednisone     Anxiety and paranoia    Social History   Socioeconomic History  . Marital status: Widowed    Spouse name: Not on file  . Number of children: Not on file  . Years of education: Not on file  .  Highest education level: Not on file  Occupational History  . Not on file  Tobacco Use  . Smoking status: Former Smoker    Packs/day: 1.00    Years: 28.00    Pack years: 28.00    Types: Cigarettes    Start date: 1968    Quit date: 1996    Years since quitting: 25.8  . Smokeless tobacco: Never Used  Vaping Use  . Vaping Use: Never used  Substance and Sexual Activity  . Alcohol use: No  . Drug use: No  . Sexual activity: Not on file  Other Topics Concern  . Not on file  Social History Narrative   Lives in [place]   Attends [employer]   Disabled carpenter   Social Determinants of Health   Financial Resource Strain:   . Difficulty of Paying Living Expenses: Not on file  Food Insecurity:   . Worried About Charity fundraiser in the Last Year: Not on file  . Ran Out of Food in the Last Year: Not on file  Transportation Needs:   . Lack of Transportation (Medical): Not on file  . Lack of Transportation (Non-Medical): Not on file  Physical Activity:   . Days of Exercise per Week: Not on file  . Minutes of Exercise per Session: Not on file  Stress:   . Feeling of Stress : Not on file  Social Connections:   . Frequency of Communication with Friends and Family: Not on file  . Frequency of Social Gatherings with Friends and Family: Not on file  . Attends Religious Services: Not on file  . Active Member of Clubs or  Organizations: Not on file  . Attends Archivist Meetings: Not on file  . Marital Status: Not on file  Intimate Partner Violence:   . Fear of Current or Ex-Partner: Not on file  . Emotionally Abused: Not on file  . Physically Abused: Not on file  . Sexually Abused: Not on file    Family History  Problem Relation Age of Onset  . Diabetes Mother   . Heart disease Mother   . Stroke Mother   . Heart attack Mother   . Heart disease Father   . Heart attack Father   . Stroke Father   . COPD Sister   . Congestive Heart Failure Sister   . Hypertension Sister   . Diabetes Sister   . Hypertension Brother   . Diabetes Sister   . Hypertension Sister   . Diabetes Sister   . Hypertension Sister     ROS- All systems are reviewed and negative except as per the HPI above  Physical Exam: Vitals:   06/19/20 0907  BP: 108/72  Pulse: 73  Weight: 86.2 kg  Height: 5\' 10"  (1.778 m)   Wt Readings from Last 3 Encounters:  06/19/20 86.2 kg  05/28/20 91.3 kg  05/26/20 90.4 kg    Labs: Lab Results  Component Value Date   NA 137 05/26/2020   K 4.4 05/26/2020   CL 101 05/26/2020   CO2 26 05/26/2020   GLUCOSE 192 (H) 05/26/2020   BUN 14 05/26/2020   CREATININE 1.12 05/26/2020   CALCIUM 9.5 05/26/2020   PHOS 4.4 02/09/2017   MG 2.2 12/01/2018   Lab Results  Component Value Date   INR 1.25 05/04/2018   Lab Results  Component Value Date   CHOL 179 05/04/2018   HDL 38 (L) 05/04/2018   LDLCALC 99 05/04/2018  TRIG 212 (H) 05/04/2018     GEN- The patient is well appearing, alert and oriented x 3 today.   Head- normocephalic, atraumatic Eyes-  Sclera clear, conjunctiva pink Ears- hearing intact Oropharynx- clear Neck- supple, no JVP Lymph- no cervical lymphadenopathy Lungs- Clear to ausculation bilaterally, normal work of breathing Heart- irregular rate and rhythm, no murmurs, rubs or gallops, PMI not laterally displaced GI- soft, NT, ND, + BS Extremities- no  clubbing, cyanosis, or edema MS- no significant deformity or atrophy Skin- no rash or lesion Psych- euthymic mood, full affect Neuro- strength and sensation are intact  EKG-afib at 73 bpm,NSIV block, qrs int 184 ms, qtc 577 ms     Assessment and Plan: 1. Persistent  afib  ERAF after cardioversion 9/16 With pt's h/o of heart failure. LV dysfunction, it is important to return him  to SR He wishes to restart amiodarone Will reload at 200 mg bid  He will be scheduled for cardioversion 11/15 He will continue amiodarone at 200 mg bid until I see him back one week after cardioversion  Cbc/bmet/covid   2. CHA2DS2VASc score of 4 Continue eliquis 5 mg bid States no missed anticoagulation for at least 3 weeks    3. HTN Stable   4. CHF Normovolemic   5. CAD No anginal symptoms   Geroge Baseman. Constance Hackenberg, [facility] [address] [place], [place] [address] [phone]

## 2020-06-19 NOTE — Patient Instructions (Addendum)
Cardioversion scheduled for Monday, November 15th  - Arrive at the [facility] and go to admitting at 1030AM  - Do not eat or drink anything after midnight the night prior to your procedure.  - Take all your morning medication (except diabetic medications) with a sip of water prior to arrival.  - You will not be able to drive home after your procedure.  - Do NOT miss any doses of your blood thinner - if you should miss a dose please notify our office immediately.  - If you feel as if you go back into normal rhythm prior to scheduled cardioversion, please notify our office immediately. If your procedure is canceled in the cardioversion suite you will be charged a cancellation fee.  Continue Amiodarone at 200mg  twice a day until we see you back for follow up.

## 2020-06-23 ENCOUNTER — Telehealth: Payer: Self-pay | Admitting: Internal Medicine

## 2020-06-23 NOTE — Telephone Encounter (Signed)
*  STAT* If patient is at the pharmacy, call can be transferred to refill team.   1. Which medications need to be refilled? (please list name of each medication and dose if known)   digoxin (LANOXIN) 0.125 MG tablet [[id number]]   spironolactone (ALDACTONE) 25 MG tablet [[id number]]    2. Which pharmacy/location (including street and city if local pharmacy) is medication to be sent to?  [facility]  3. Do they need a 30 day or 90 day supply?  90 day

## 2020-06-24 ENCOUNTER — Other Ambulatory Visit (HOSPITAL_COMMUNITY): Payer: Self-pay | Admitting: *Deleted

## 2020-06-24 MED ORDER — DIGOXIN 125 MCG PO TABS
0.1250 mg | ORAL_TABLET | Freq: Every day | ORAL | 3 refills | Status: DC
Start: 2020-06-24 — End: 2020-06-25

## 2020-06-24 MED ORDER — SPIRONOLACTONE 25 MG PO TABS
25.0000 mg | ORAL_TABLET | Freq: Every day | ORAL | 3 refills | Status: AC
Start: 2020-06-24 — End: ?

## 2020-06-24 MED ORDER — SPIRONOLACTONE 25 MG PO TABS
25.0000 mg | ORAL_TABLET | Freq: Every day | ORAL | 3 refills | Status: DC
Start: 2020-06-24 — End: 2020-06-24

## 2020-06-25 ENCOUNTER — Other Ambulatory Visit (HOSPITAL_COMMUNITY): Payer: Self-pay | Admitting: *Deleted

## 2020-06-25 NOTE — Telephone Encounter (Signed)
LM to discuss

## 2020-06-25 NOTE — Telephone Encounter (Signed)
Patient should stop digoxin. Patient was notified.

## 2020-06-27 ENCOUNTER — Other Ambulatory Visit (HOSPITAL_COMMUNITY)
Admission: RE | Admit: 2020-06-27 | Discharge: 2020-06-27 | Disposition: A | Discharge status: Home / Self Care | Payer: Medicare Other | Source: Ambulatory Visit | Attending: Cardiology | Admitting: Cardiology

## 2020-06-27 ENCOUNTER — Other Ambulatory Visit: Payer: Self-pay

## 2020-06-27 DIAGNOSIS — Z20822 Contact with and (suspected) exposure to covid-19: Secondary | ICD-10-CM | POA: Insufficient documentation

## 2020-06-27 DIAGNOSIS — Z01812 Encounter for preprocedural laboratory examination: Secondary | ICD-10-CM | POA: Insufficient documentation

## 2020-06-27 LAB — SARS CORONAVIRUS 2 (TAT 6-24 HRS): SARS Coronavirus 2: NEGATIVE

## 2020-06-30 ENCOUNTER — Ambulatory Visit (HOSPITAL_COMMUNITY)
Admission: RE | Admit: 2020-06-30 | Discharge: 2020-06-30 | Disposition: A | Discharge status: Home / Self Care | Payer: Medicare Other | Attending: Cardiology | Admitting: Cardiology

## 2020-06-30 ENCOUNTER — Other Ambulatory Visit: Payer: Self-pay

## 2020-06-30 ENCOUNTER — Encounter (HOSPITAL_COMMUNITY)
Admission: RE | Disposition: A | Discharge status: Home / Self Care | Payer: Self-pay | Source: Home / Self Care | Attending: Cardiology

## 2020-06-30 ENCOUNTER — Ambulatory Visit (HOSPITAL_COMMUNITY): Payer: Medicare Other | Admitting: Anesthesiology

## 2020-06-30 ENCOUNTER — Encounter (HOSPITAL_COMMUNITY): Payer: Self-pay | Admitting: Cardiology

## 2020-06-30 DIAGNOSIS — J449 Chronic obstructive pulmonary disease, unspecified: Secondary | ICD-10-CM | POA: Insufficient documentation

## 2020-06-30 DIAGNOSIS — G4733 Obstructive sleep apnea (adult) (pediatric): Secondary | ICD-10-CM | POA: Insufficient documentation

## 2020-06-30 DIAGNOSIS — I130 Hypertensive heart and chronic kidney disease with heart failure and stage 1 through stage 4 chronic kidney disease, or unspecified chronic kidney disease: Secondary | ICD-10-CM | POA: Diagnosis not present

## 2020-06-30 DIAGNOSIS — I4819 Other persistent atrial fibrillation: Secondary | ICD-10-CM | POA: Diagnosis not present

## 2020-06-30 DIAGNOSIS — I5022 Chronic systolic (congestive) heart failure: Secondary | ICD-10-CM | POA: Insufficient documentation

## 2020-06-30 DIAGNOSIS — I110 Hypertensive heart disease with heart failure: Secondary | ICD-10-CM | POA: Diagnosis not present

## 2020-06-30 DIAGNOSIS — Z79899 Other long term (current) drug therapy: Secondary | ICD-10-CM | POA: Insufficient documentation

## 2020-06-30 DIAGNOSIS — I4891 Unspecified atrial fibrillation: Secondary | ICD-10-CM | POA: Diagnosis not present

## 2020-06-30 DIAGNOSIS — I2510 Atherosclerotic heart disease of native coronary artery without angina pectoris: Secondary | ICD-10-CM | POA: Insufficient documentation

## 2020-06-30 DIAGNOSIS — D631 Anemia in chronic kidney disease: Secondary | ICD-10-CM | POA: Diagnosis not present

## 2020-06-30 DIAGNOSIS — E039 Hypothyroidism, unspecified: Secondary | ICD-10-CM | POA: Diagnosis not present

## 2020-06-30 DIAGNOSIS — Z87891 Personal history of nicotine dependence: Secondary | ICD-10-CM | POA: Diagnosis not present

## 2020-06-30 DIAGNOSIS — N183 Chronic kidney disease, stage 3 unspecified: Secondary | ICD-10-CM | POA: Diagnosis not present

## 2020-06-30 DIAGNOSIS — Z95810 Presence of automatic (implantable) cardiac defibrillator: Secondary | ICD-10-CM | POA: Insufficient documentation

## 2020-06-30 DIAGNOSIS — I255 Ischemic cardiomyopathy: Secondary | ICD-10-CM | POA: Diagnosis not present

## 2020-06-30 DIAGNOSIS — Z888 Allergy status to other drugs, medicaments and biological substances status: Secondary | ICD-10-CM | POA: Insufficient documentation

## 2020-06-30 DIAGNOSIS — Z7901 Long term (current) use of anticoagulants: Secondary | ICD-10-CM | POA: Diagnosis not present

## 2020-06-30 DIAGNOSIS — Z79890 Hormone replacement therapy (postmenopausal): Secondary | ICD-10-CM | POA: Insufficient documentation

## 2020-06-30 DIAGNOSIS — Z885 Allergy status to narcotic agent status: Secondary | ICD-10-CM | POA: Diagnosis not present

## 2020-06-30 DIAGNOSIS — I5023 Acute on chronic systolic (congestive) heart failure: Secondary | ICD-10-CM | POA: Diagnosis not present

## 2020-06-30 DIAGNOSIS — Z7982 Long term (current) use of aspirin: Secondary | ICD-10-CM | POA: Insufficient documentation

## 2020-06-30 HISTORY — PX: CARDIOVERSION: SHX1299

## 2020-06-30 SURGERY — CARDIOVERSION
Anesthesia: General

## 2020-06-30 MED ORDER — PHENYLEPHRINE 40 MCG/ML (10ML) SYRINGE FOR IV PUSH (FOR BLOOD PRESSURE SUPPORT)
PREFILLED_SYRINGE | INTRAVENOUS | Status: DC | PRN
  Administered 2020-06-30: 120 ug via INTRAVENOUS

## 2020-06-30 MED ORDER — LIDOCAINE 2% (20 MG/ML) 5 ML SYRINGE
INTRAMUSCULAR | Status: DC | PRN
  Administered 2020-06-30: 20 mg via INTRAVENOUS

## 2020-06-30 MED ORDER — PROPOFOL 10 MG/ML IV BOLUS
INTRAVENOUS | Status: DC | PRN
  Administered 2020-06-30: 50 mg via INTRAVENOUS

## 2020-06-30 MED ORDER — SODIUM CHLORIDE 0.9 % IV SOLN
INTRAVENOUS | Status: AC | PRN
  Administered 2020-06-30: 500 mL via INTRAMUSCULAR

## 2020-06-30 MED ORDER — SODIUM CHLORIDE 0.9 % IV SOLN: INTRAVENOUS | Status: DC | PRN

## 2020-06-30 NOTE — CV Procedure (Addendum)
    Cardioversion Note  Stephen Cardenas [id number] Oct 30, 1955  Procedure: DC Cardioversion Indications: atrial fibrillation  Procedure Details Consent: Obtained Time Out: Verified patient identification, verified procedure, site/side was marked, verified correct patient position, special equipment/implants available, Radiology Safety Procedures followed,  medications/allergies/relevent history reviewed, required imaging and test results available.  Performed  The patient has been on adequate anticoagulation.  The patient received IV Propofol 60 mg and IV Lidocaine 20 mg  Administered by anesthesia staff for deep sedation.  Synchronous cardioversion was performed at 120 joules.  The cardioversion was successful. Medtronic rep interrogated the ICD and confirmed successful cardioversion.   Complications: No apparent complications Patient did tolerate procedure well.   Stephen Dawley, MD, [facility] 06/30/2020, 11:02 AM

## 2020-06-30 NOTE — Anesthesia Preprocedure Evaluation (Addendum)
Anesthesia Evaluation  Patient identified by MRN, date of birth, ID band Patient awake    Reviewed: Allergy & Precautions, NPO status , Patient's Chart, lab work & pertinent test results  Airway Mallampati: III  TM Distance: >3 FB Neck ROM: Full    Dental  (+) Edentulous Upper, Edentulous Lower   Pulmonary sleep apnea and Continuous Positive Airway Pressure Ventilation , COPD, former smoker,  28 pack year history, quit smoking 1996   Pulmonary exam normal breath sounds clear to auscultation       Cardiovascular hypertension, Pt. on medications + CAD, + Past MI, + Cardiac Stents and +CHF (LVEF 20%)  Normal cardiovascular exam+ dysrhythmias Atrial Fibrillation and Ventricular Tachycardia + Cardiac Defibrillator + Valvular Problems/Murmurs (mod MR) MR  Rhythm:Regular Rate:Normal  Echo 04/2020: 1. Left ventricular ejection fraction, by estimation, is approximately  20%. The left ventricle has severely decreased function. The left  ventricle demonstrates regional wall motion abnormalities (see scoring  diagram/findings for description). The left  ventricular internal cavity size was moderately dilated. Left ventricular  diastolic parameters are indeterminate. No obvious mural thrombus.  2. Right ventricular systolic function is normal. The right ventricular  size is normal. A device wire is visualized. There is moderately elevated  pulmonary artery systolic pressure. The estimated right ventricular  systolic pressure is 96.0 mmHg.  3. Left atrial size was mild to moderately dilated.  4. The mitral valve is abnormal, mildly thickened and calcified. Moderate  mitral valve regurgitation, eccentric.  5. The aortic valve is tricuspid. Aortic valve regurgitation is not  visualized.  6. Aortic dilatation noted. There is mild dilatation of the aortic root,  measuring 40 mm.  7. The inferior vena cava is normal in size with <50%  respiratory  variability, suggesting right atrial pressure of 8 mmHg.    Neuro/Psych PSYCHIATRIC DISORDERS Anxiety Depression negative neurological ROS     GI/Hepatic negative GI ROS, Neg liver ROS,   Endo/Other  Hypothyroidism   Renal/GU Renal InsufficiencyRenal diseaseCr 1.17  negative genitourinary   Musculoskeletal negative musculoskeletal ROS (+)   Abdominal   Peds  Hematology  (+) Blood dyscrasia, anemia , hct 36.2, plt 492   Anesthesia Other Findings   Reproductive/Obstetrics negative OB ROS                           Anesthesia Physical Anesthesia Plan  ASA: IV  Anesthesia Plan: General   Post-op Pain Management:    Induction: Intravenous  PONV Risk Score and Plan: Propofol infusion, TIVA and Treatment may vary due to age or medical condition  Airway Management Planned: Natural Airway and Mask  Additional Equipment: None  Intra-op Plan:   Post-operative Plan:   Informed Consent: I have reviewed the patients History and Physical, chart, labs and discussed the procedure including the risks, benefits and alternatives for the proposed anesthesia with the patient or authorized representative who has indicated his/her understanding and acceptance.     Dental advisory given  Plan Discussed with: CRNA  Anesthesia Plan Comments:        Anesthesia Quick Evaluation

## 2020-06-30 NOTE — Transfer of Care (Signed)
Immediate Anesthesia Transfer of Care Note  Patient: Stephen Cardenas  Procedure(s) Performed: CARDIOVERSION (N/A )  Patient Location: Endoscopy Unit  Anesthesia Type:General  Level of Consciousness: awake and alert   Airway & Oxygen Therapy: Patient Spontanous Breathing  Post-op Assessment: Report given to RN, Post -op Vital signs reviewed and stable and Patient moving all extremities X 4  Post vital signs: Reviewed and stable  Last Vitals:  Vitals Value Taken Time  BP    Temp 36.1 C 06/30/20 1102  Pulse    Resp    SpO2      Last Pain:  Vitals:   06/30/20 1102  TempSrc: Oral  PainSc:          Complications: No complications documented.

## 2020-06-30 NOTE — Anesthesia Procedure Notes (Signed)
Procedure Name: General with mask airway Date/Time: 06/30/2020 10:45 AM Performed by: Harden Mo, CRNA Pre-anesthesia Checklist: Patient identified, Emergency Drugs available, Suction available and Patient being monitored Patient Re-evaluated:Patient Re-evaluated prior to induction Oxygen Delivery Method: Ambu bag Preoxygenation: Pre-oxygenation with 100% oxygen Induction Type: IV induction Ventilation: Mask ventilation without difficulty Placement Confirmation: positive ETCO2 and breath sounds checked- equal and bilateral Dental Injury: Teeth and Oropharynx as per pre-operative assessment

## 2020-06-30 NOTE — Interval H&P Note (Signed)
History and Physical Interval Note:  06/30/2020 10:28 AM  Stephen Cardenas  has presented today for surgery, with the diagnosis of AFIB.  The various methods of treatment have been discussed with the patient and family. After consideration of risks, benefits and other options for treatment, the patient has consented to  Procedure(s): CARDIOVERSION (N/A) as a surgical intervention.  The patient's history has been reviewed, patient examined, no change in status, stable for surgery.  I have reviewed the patient's chart and labs.  Questions were answered to the patient's satisfaction.     Ena Dawley

## 2020-06-30 NOTE — Anesthesia Postprocedure Evaluation (Signed)
Anesthesia Post Note  Patient: Stephen Cardenas  Procedure(s) Performed: CARDIOVERSION (N/A )     Patient location during evaluation: PACU Anesthesia Type: General Level of consciousness: awake and alert, oriented and patient cooperative Pain management: pain level controlled Vital Signs Assessment: post-procedure vital signs reviewed and stable Respiratory status: spontaneous breathing, nonlabored ventilation and respiratory function stable Cardiovascular status: blood pressure returned to baseline and stable Postop Assessment: no apparent nausea or vomiting Anesthetic complications: no   No complications documented.  Last Vitals:  Vitals:   06/30/20 1119 06/30/20 1130  BP: 114/75 120/80  Pulse: 95 95  Resp: (!) 24 19  Temp:    SpO2: 91% 92%    Last Pain:  Vitals:   06/30/20 1130  TempSrc:   PainSc: 0-No pain                 [name]

## 2020-07-03 ENCOUNTER — Other Ambulatory Visit (HOSPITAL_COMMUNITY): Payer: Self-pay | Admitting: *Deleted

## 2020-07-03 MED ORDER — AMIODARONE HCL 200 MG PO TABS
ORAL_TABLET | ORAL | 0 refills | Status: DC
Start: 2020-07-03 — End: 2020-07-07

## 2020-07-07 ENCOUNTER — Encounter (HOSPITAL_COMMUNITY): Payer: Self-pay | Admitting: Nurse Practitioner

## 2020-07-07 ENCOUNTER — Ambulatory Visit (HOSPITAL_COMMUNITY)
Admission: RE | Admit: 2020-07-07 | Discharge: 2020-07-07 | Disposition: A | Discharge status: Home / Self Care | Payer: Medicare Other | Source: Ambulatory Visit | Attending: Nurse Practitioner | Admitting: Nurse Practitioner

## 2020-07-07 ENCOUNTER — Other Ambulatory Visit: Payer: Self-pay

## 2020-07-07 VITALS — BP 104/70 | HR 74 | Ht 70.0 in | Wt 191.6 lb

## 2020-07-07 DIAGNOSIS — Z7901 Long term (current) use of anticoagulants: Secondary | ICD-10-CM | POA: Insufficient documentation

## 2020-07-07 DIAGNOSIS — I2510 Atherosclerotic heart disease of native coronary artery without angina pectoris: Secondary | ICD-10-CM | POA: Insufficient documentation

## 2020-07-07 DIAGNOSIS — Z8249 Family history of ischemic heart disease and other diseases of the circulatory system: Secondary | ICD-10-CM | POA: Diagnosis not present

## 2020-07-07 DIAGNOSIS — I110 Hypertensive heart disease with heart failure: Secondary | ICD-10-CM | POA: Insufficient documentation

## 2020-07-07 DIAGNOSIS — G4733 Obstructive sleep apnea (adult) (pediatric): Secondary | ICD-10-CM | POA: Diagnosis not present

## 2020-07-07 DIAGNOSIS — I255 Ischemic cardiomyopathy: Secondary | ICD-10-CM | POA: Diagnosis not present

## 2020-07-07 DIAGNOSIS — I5022 Chronic systolic (congestive) heart failure: Secondary | ICD-10-CM | POA: Diagnosis not present

## 2020-07-07 DIAGNOSIS — D6869 Other thrombophilia: Secondary | ICD-10-CM

## 2020-07-07 DIAGNOSIS — E039 Hypothyroidism, unspecified: Secondary | ICD-10-CM | POA: Diagnosis not present

## 2020-07-07 DIAGNOSIS — Z87891 Personal history of nicotine dependence: Secondary | ICD-10-CM | POA: Diagnosis not present

## 2020-07-07 DIAGNOSIS — I252 Old myocardial infarction: Secondary | ICD-10-CM | POA: Insufficient documentation

## 2020-07-07 DIAGNOSIS — I4819 Other persistent atrial fibrillation: Secondary | ICD-10-CM | POA: Diagnosis not present

## 2020-07-07 DIAGNOSIS — Z79899 Other long term (current) drug therapy: Secondary | ICD-10-CM | POA: Diagnosis not present

## 2020-07-07 DIAGNOSIS — J449 Chronic obstructive pulmonary disease, unspecified: Secondary | ICD-10-CM | POA: Diagnosis not present

## 2020-07-07 DIAGNOSIS — Z7982 Long term (current) use of aspirin: Secondary | ICD-10-CM | POA: Diagnosis not present

## 2020-07-07 MED ORDER — CARVEDILOL 3.125 MG PO TABS: ORAL_TABLET | ORAL | Status: DC

## 2020-07-07 MED ORDER — AMIODARONE HCL 200 MG PO TABS
200.0000 mg | ORAL_TABLET | Freq: Every day | ORAL | 11 refills | Status: DC
Start: 2020-07-07 — End: 2020-08-18

## 2020-07-07 NOTE — Progress Notes (Signed)
Primary Care Physician: [facility], [facility] Referring Physician: Dr. Haroldine Laws AHF- Dr. 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 male with a h/o of chronic systolic CHF (EF 41%) due to ischemic cardiomyopathy with a history of MI. AICD, COPD, OSA on CPAP, HTN, PAF, and hypothyroidism. He has a history of multiple MI's. One in 2007 and one in 2014 that resulted in cardiogenic shock and a prolonged ICU hospitalization.    He was seen by Dr. Haroldine Laws 05/26/20 and was found to be in afib. He just  had a cardioversion set up by Dr. Rayann Heman  in September. He had an ablation with Dr. Rayann Heman 07/2019 at which time amiodarone was stopped. Per Dr. Jackalyn Lombard last  Note, if afib returned, he could consider  going back on amiodarone or another ablation. Dr. Haroldine Laws gave pt a Rx for amiodarone on Monday but he has not started yet.   I  discussed with pt again  his options of repeat ablation vrs going back on amiodarone and he still wishes to restart amiodarone. He is in afib, rate controlled  at 76 bpm. He is unaware that he is in afib.  F/u in afib clinic, 06/19/20. He is in the afib clinic  for f/u amiodarone loading. He is on 200 mg bid. EKG shows afib at 73 bpm, qrs int 184 ms, qtc 577 ms he will be scheduled for cardioversion 11/15. He has not missed any anticoagulation and he has had his covid vaccines.    F/u afib clinic, 11/22, for f/u cardioversion. He is in SR and is feeling stronger every day. He will reduce amiodarone to 200 mg daily.   Today, he denies symptoms of palpitations, chest pain, shortness of breath, orthopnea, PND, lower extremity edema, dizziness, presyncope, syncope, or neurologic sequela. The patient is tolerating medications without difficulties and is otherwise without complaint today.   Past Medical History:  Diagnosis Date  . AICD (automatic cardioverter/defibrillator) present 2014  . Atrial fibrillation ([place])   . CAD (coronary artery disease)    S/p  multiple prior MIs/PCIs // amdx in 2014 with acute stent thrombosis c/b CGS // Admx to [facility] in 01/3844 2/2 a/c systolic HF >> [facility] 10/19/44: mLAD 95 ISR, dLAD 30, D2 99; OM2 95 ISR; pRCA 30/50, mRCA 90/80, dRCA 30 EF 15 >> min to no viability on Thallium study (not surgical candidate) - PCI>> 3.5 x 20 mm Promus DES to the LCx 3 x 38 mm Promus DES to the proximal mid RCA  . Chronic systolic heart failure (HCC)    2/2 to ischemic CM - Echo 02/12/17:  Mild LVH, EF 15-20, mod to severe MR, severe LAE, mild RVE, mod reduced RVSF, mild RAE, mod TR, PASP 65  . COPD (chronic obstructive pulmonary disease) ([place])   . History of MI (myocardial infarction)   . History of pneumonia   . HTN (hypertension)   . Hyperlipidemia 03/08/2017  . Hypothyroidism   . OSA (obstructive sleep apnea) 03/08/2017  . Polymorphic ventricular tachycardia ([place]) 04/01/2020   appropriate ICD shock delivered   Past Surgical History:  Procedure Laterality Date  . ATRIAL FIBRILLATION ABLATION N/A 07/24/2019   Procedure: ATRIAL FIBRILLATION ABLATION;  Surgeon: Thompson Grayer, MD;  Location: [place] [facility];  Service: Cardiovascular;  Laterality: N/A;  . CARDIAC DEFIBRILLATOR PLACEMENT  2014   Medtronic Every XT DR ICD implanted at the [facility] for primary prevention  . CARDIOVERSION N/A 03/09/2018   Procedure: CARDIOVERSION;  Surgeon: Glori Bickers  R, MD;  Location: [place];  Service: Cardiovascular;  Laterality: N/A;  . CARDIOVERSION N/A 05/01/2020   Procedure: CARDIOVERSION;  Surgeon: Arnoldo Lenis, MD;  Location: [facility];  Service: Endoscopy;  Laterality: N/A;  . CARDIOVERSION N/A 06/30/2020   Procedure: CARDIOVERSION;  Surgeon: Dorothy Spark, MD;  Location: [facility] ENDOSCOPY;  Service: Cardiovascular;  Laterality: N/A;  . CORONARY STENT INTERVENTION N/A 02/22/2017   Procedure: Coronary Stent Intervention;  Surgeon: Sherren Mocha, MD;  Location: [place] [facility];  Service: Cardiovascular;  Laterality: N/A;  CFX  and RCA  . FACIAL RECONSTRUCTION SURGERY Right 1984  . FEMUR FRACTURE SURGERY Right 1984  . RIGHT/LEFT HEART CATH AND CORONARY ANGIOGRAPHY N/A 02/14/2017   Procedure: Right/Left Heart Cath and Coronary Angiography;  Surgeon: Jolaine Artist, MD;  Location: [place] [facility];  Service: Cardiovascular;  Laterality: N/A;  . WRIST FRACTURE SURGERY Right 2015    Current Outpatient Medications  Medication Sig Dispense Refill  . ALPRAZolam (XANAX) 0.5 MG tablet Take 0.5-1 mg by mouth 3 (three) times daily as needed for anxiety.     Marland Kitchen amiodarone (PACERONE) 200 MG tablet Take 1 tablet (200 mg total) by mouth daily. 30 tablet 11  . apixaban (ELIQUIS) 5 MG TABS tablet Take 1 tablet (5 mg total) by mouth 2 (two) times daily. 180 tablet 3  . ascorbic acid (VITAMIN C) 500 MG tablet Take 500 mg by mouth daily.    Marland Kitchen aspirin EC 81 MG tablet Take 81 mg by mouth daily.    Marland Kitchen atorvastatin (LIPITOR) 20 MG tablet Take 20 mg by mouth at bedtime.     . carvedilol (COREG) 3.125 MG tablet Take 1 tablet (3.125 mg total) by mouth 2 (two) times daily with a meal. 60 tablet 3  . Cholecalciferol (VITAMIN D3) 2000 units TABS Take 2,000 Units by mouth daily.    . DULoxetine (CYMBALTA) 30 MG capsule Take 90 mg by mouth at bedtime.     . Ferrous Sulfate (IRON) 325 (65 Fe) MG TABS TAKE 1 TABLET BY MOUTH TWICE DAILY WITH A MEAL 60 tablet 3  . furosemide (LASIX) 20 MG tablet Take 20 mg by mouth 2 (two) times daily.    Marland Kitchen levothyroxine (SYNTHROID) 200 MCG tablet Take 1 tablet (200 mcg total) by mouth daily at 6 (six) AM. 30 tablet 1  . magnesium oxide (MAG-OX) 400 MG tablet Take 1 tablet (400 mg total) by mouth daily. 90 tablet 3  . Multiple Vitamins-Minerals (MULTIVITAMIN PO) Take 1 tablet by mouth daily.    . pantoprazole (PROTONIX) 40 MG tablet Take 1 tablet (40 mg total) by mouth at bedtime. 30 tablet 6  . potassium chloride (K-DUR) 10 MEQ tablet Take 10 mEq by mouth 2 (two) times daily.    . sacubitril-valsartan (ENTRESTO)  49-51 MG Take 1 tablet by mouth 2 (two) times daily. 60 tablet 6  . spironolactone (ALDACTONE) 25 MG tablet Take 1 tablet (25 mg total) by mouth daily. 30 tablet 3  . vitamin B-12 (CYANOCOBALAMIN) 1000 MCG tablet Take 2,000 mcg by mouth daily.     No current facility-administered medications for this encounter.    Allergies  Allergen Reactions  . Codeine Other (See Comments) and Nausea Only    Increased work of breathing, diaphoretic  . Mirtazapine     Other reaction(s): Other - See Comments  . Prednisone     Anxiety and paranoia    Social History   Socioeconomic History  . Marital status: Widowed  Spouse name: Not on file  . Number of children: Not on file  . Years of education: Not on file  . Highest education level: Not on file  Occupational History  . Not on file  Tobacco Use  . Smoking status: Former Smoker    Packs/day: 1.00    Years: 28.00    Pack years: 28.00    Types: Cigarettes    Start date: 1968    Quit date: 1996    Years since quitting: 25.9  . Smokeless tobacco: Never Used  Vaping Use  . Vaping Use: Never used  Substance and Sexual Activity  . Alcohol use: No  . Drug use: No  . Sexual activity: Not on file  Other Topics Concern  . Not on file  Social History Narrative   Lives in [place]   Attends [employer]   Disabled carpenter   Social Determinants of Health   Financial Resource Strain:   . Difficulty of Paying Living Expenses: Not on file  Food Insecurity:   . Worried About Charity fundraiser in the Last Year: Not on file  . Ran Out of Food in the Last Year: Not on file  Transportation Needs:   . Lack of Transportation (Medical): Not on file  . Lack of Transportation (Non-Medical): Not on file  Physical Activity:   . Days of Exercise per Week: Not on file  . Minutes of Exercise per Session: Not on file  Stress:   . Feeling of Stress : Not on file  Social Connections:   . Frequency of Communication with Friends and  Family: Not on file  . Frequency of Social Gatherings with Friends and Family: Not on file  . Attends Religious Services: Not on file  . Active Member of Clubs or Organizations: Not on file  . Attends Archivist Meetings: Not on file  . Marital Status: Not on file  Intimate Partner Violence:   . Fear of Current or Ex-Partner: Not on file  . Emotionally Abused: Not on file  . Physically Abused: Not on file  . Sexually Abused: Not on file    Family History  Problem Relation Age of Onset  . Diabetes Mother   . Heart disease Mother   . Stroke Mother   . Heart attack Mother   . Heart disease Father   . Heart attack Father   . Stroke Father   . COPD Sister   . Congestive Heart Failure Sister   . Hypertension Sister   . Diabetes Sister   . Hypertension Brother   . Diabetes Sister   . Hypertension Sister   . Diabetes Sister   . Hypertension Sister     ROS- All systems are reviewed and negative except as per the HPI above  Physical Exam: Vitals:   07/07/20 0956  BP: 104/70  Pulse: 74  Weight: 86.9 kg  Height: 5\' 10"  (1.778 m)   Wt Readings from Last 3 Encounters:  07/07/20 86.9 kg  06/30/20 86.2 kg  06/19/20 86.2 kg    Labs: Lab Results  Component Value Date   NA 137 06/19/2020   K 4.7 06/19/2020   CL 104 06/19/2020   CO2 23 06/19/2020   GLUCOSE 130 (H) 06/19/2020   BUN 13 06/19/2020   CREATININE 1.17 06/19/2020   CALCIUM 8.9 06/19/2020   PHOS 4.4 02/09/2017   MG 2.2 12/01/2018   Lab Results  Component Value Date   INR 1.25 05/04/2018  Lab Results  Component Value Date   CHOL 179 05/04/2018   HDL 38 (L) 05/04/2018   LDLCALC 99 05/04/2018   TRIG 212 (H) 05/04/2018     GEN- The patient is well appearing, alert and oriented x 3 today.   Head- normocephalic, atraumatic Eyes-  Sclera clear, conjunctiva pink Ears- hearing intact Oropharynx- clear Neck- supple, no JVP Lymph- no cervical lymphadenopathy Lungs- Clear to ausculation  bilaterally, normal work of breathing Heart- regular rate and rhythm, no murmurs, rubs or gallops, PMI not laterally displaced GI- soft, NT, ND, + BS Extremities- no clubbing, cyanosis, or edema MS- no significant deformity or atrophy Skin- no rash or lesion Psych- euthymic mood, full affect Neuro- strength and sensation are intact  EKG-NSR at 74 bpm, pr int 296 ms, qrs int 206 ms,  qtc 652 ms( corrected to 538 ms) and pt also  has an ICD, will review with Dr. Rayann Heman  as well    Assessment and Plan: 1. Persistent  afib  ERAF after cardioversion 9/16 With pt's h/o of heart failure. LV dysfunction, it was  important to return him  to SR He wished to restart amiodarone He is off digoxin with amio loading  He was  reloaded at 200 mg bid and underwent successful cardioversion 11/15 He will now lower amiodarone to 200 mg daily  He is feeling stronger every day  2. CHA2DS2VASc score of 4 Continue eliquis 5 mg bid States no missed anticoagulation    3. HTN Stable   4. CHF/LV dysfunction/ICD Normovolemic   5. CAD No anginal symptoms    F/u with Dr. Haroldine Laws 12/13 and Dr. Rayann Heman 1/7     Geroge Baseman. Venie Montesinos, [facility] [address] [place], [place] [address] [phone]

## 2020-07-07 NOTE — Patient Instructions (Addendum)
Amiodarone 200mg  once daily  Follow-up  with Dr. Haroldine Laws and Dr. Rayann Heman as schedule

## 2020-07-07 NOTE — Addendum Note (Signed)
Encounter addended by: Enid Derry, CMA on: 07/07/2020 11:43 AM  Actions taken: Order list changed

## 2020-07-09 NOTE — Progress Notes (Signed)
No ICM remote transmission received for 07/01/2020 and next ICM transmission scheduled for 07/21/2020.

## 2020-07-21 ENCOUNTER — Ambulatory Visit (INDEPENDENT_AMBULATORY_CARE_PROVIDER_SITE_OTHER): Payer: Medicare Other

## 2020-07-21 ENCOUNTER — Telehealth: Payer: Self-pay

## 2020-07-21 DIAGNOSIS — I5022 Chronic systolic (congestive) heart failure: Secondary | ICD-10-CM

## 2020-07-21 DIAGNOSIS — Z95810 Presence of automatic (implantable) cardiac defibrillator: Secondary | ICD-10-CM

## 2020-07-21 NOTE — Telephone Encounter (Signed)
Remote ICM transmission received.  Attempted call to patient regarding ICM remote transmission and left detailed message per DPR to return call.  Advised to return call for any fluid symptoms or questions.      

## 2020-07-21 NOTE — Progress Notes (Signed)
EPIC Encounter for ICM Monitoring  Patient Name: Stephen Cardenas is a 64 y.o. male Date: 07/21/2020 Primary Care Physican: Administration, Veterans Primary Cardiologist:Bensimhon Electrophysiologist:Allred 04/29/2020 Weight:195lbs  Since 01-Jul-2020 Time in AT/AF<0.1 hr/day (<0.1%)  Attempted call to patient and unable to reach.  Left detailed message per DPR regarding transmission. Transmission reviewed.   Optivol thoracic impedancesuggestingpossible fluid accumulation since 06/23/2020.  Prescribed:  Furosemide20 mgtake1tablet(20 mg total)twice a day.Per patient, he self adjusts Furosemide and Potassium when needed with Dr Bensimhon's approval.  Potassium10 mEq take1tablettwice a day.  Spironolactone 25 mg take 1 tablet daily  Eliquis 5 mg take 1 tablet twice a day  Labs: 10/08/2019 Creatinine 1.35, BUN 14, Potassium 3.8, Sodium 137, GFR 55->60 07/20/2019 Creatinine 1.28, BUN 20, Potassium 4.6, Sodium 138, GFR 58-67 A complete set of results can be found in Results Review.  Recommendations: Left voice mail with ICM number and encouraged to call if experiencing any fluid symptoms.  Patient self adjusts Furosemide when needed.  Follow-up plan: ICM clinic phone appointment on12/20/2021 to recheck fluid levels.91 day device clinic remote transmission12/14/2021.  EP/Cardiology Office Visits:07/28/2020 with Dr.Bensimhon.   Copy of ICM check sent to Dr.Allredand Dr Haroldine Laws for review and recommendations if needed.   3 month ICM trend: 07/21/2020    1 Year ICM trend:       [name], RN 07/21/2020 4:01 PM

## 2020-07-27 NOTE — Progress Notes (Signed)
Advanced Heart Failure Clinic Note   Referring Physician: Dr. Orpah Greek, [facility] Primary Care: Dr. Clementeen Graham Primary Cardiologist: Dr. Haroldine Laws.   HPI: Stephen Cardenas is a 64 y.o. male with systolicHF (EF 03%) due to ICM s/p ICD. Also has COPD, OSA on CPAP, HTN, PAF, and hypothyroidism.   He has a history of multiple MI's. One in 2007 and one in 2014 that resulted in cardiogenic shock and a prolonged ICU hospitalization.   Admitted in 6/18 with severe HF. Had R/L cath with severe 3v CAD with multiple areas of ISR. Right heart with Fick output/index 5.9/3.1. He underwent DES placement x 2 to LCx and mid - RCA. Had a 24-hour Thallium viability study with minimal to no viability in all segments except for the septum. Discharge weight was 158 pounds.   Echo 6/19 EF 20% RV  In 7/19 seen in HF Clinic with recurrent AF and NYHA IIIB symptoms. Underwent DC-CV on 03/09/18.   Seen by Dr. Prescott Gum 06/14/18 and deemed appropriate candidate for VAD if pt wishes to proceed.He has met a VAD patient and does not want to proceed with VAD implant. He does not like the idea of a big surgery or being attached to something constantly.   Recurrent AF in 6/20 and underwent DC-CV on 02/07/19 followed by AF ablation on 12/20.   He had appropriate ICD shock 04/01/20 for polymorphic VT. Seen by Dr. Rayann Heman in 9/21 with increasing fatigue.  Found to have recurrent AF and had DC-CV 05/01/20.    Echo 9/21 EF 20%   Seen in clinic 05/26/20 with severe fatigue and found to be back in AF. Amio started and referred back to AF Clinic. Underwent repeat DC-CV 06/30/20.   At last visit in 10/2 felt poorly in setting of recurrent AF and volume overload. We started amio and Farxiga (ordered through [facility]) and he subsequently underwent repeat DC-CV  Today he returns for HF follow up. Remains in NSR. Feels much better. Able to do ADLs without too much problem. Gets winded with steps. No orthopnea or PND. Rare CP. No bleeding  with Eliquis. Taking lasix 20 bid and will take an extra tablet as needed   CPX (04/06/18) FVC 3.58 (79%)    FEV1 2.91 (84%)     FEV1/FVC 81 (105%)    MVV 128 (94%)  Resting HR: 67 Peak HR: 118  (76% age predicted max HR) BP rest: 94/58 BP peak: 102/60 Peak VO2: 14.8 (50% predicted peak VO2) VE/VCO2 slope: 36    Review of systems complete and found to be negative unless listed in HPI.    Past Medical History:  Diagnosis Date  . AICD (automatic cardioverter/defibrillator) present 2014  . Atrial fibrillation ([place])   . CAD (coronary artery disease)    S/p multiple prior MIs/PCIs // amdx in 2014 with acute stent thrombosis c/b CGS // Admx to [facility] in 12/91 2/2 a/c systolic HF >> [facility] 03/16/81: mLAD 95 ISR, dLAD 30, D2 99; OM2 95 ISR; pRCA 30/50, mRCA 90/80, dRCA 30 EF 15 >> min to no viability on Thallium study (not surgical candidate) - PCI>> 3.5 x 20 mm Promus DES to the LCx 3 x 38 mm Promus DES to the proximal mid RCA  . CHF (congestive heart failure) ([place])   . Chronic systolic heart failure (HCC)    2/2 to ischemic CM - Echo 02/12/17:  Mild LVH, EF 15-20, mod to severe MR, severe LAE, mild RVE, mod reduced RVSF, mild RAE, mod TR, PASP  57  . COPD (chronic obstructive pulmonary disease) ([place])   . History of MI (myocardial infarction)   . History of pneumonia   . HTN (hypertension)   . Hyperlipidemia 03/08/2017  . Hypothyroidism   . OSA (obstructive sleep apnea) 03/08/2017  . Polymorphic ventricular tachycardia ([place]) 04/01/2020   appropriate ICD shock delivered    Current Outpatient Medications  Medication Sig Dispense Refill  . ALPRAZolam (XANAX) 0.5 MG tablet Take 0.5-1 mg by mouth 3 (three) times daily as needed for anxiety.     Marland Kitchen amiodarone (PACERONE) 200 MG tablet Take 1 tablet (200 mg total) by mouth daily. 30 tablet 11  . apixaban (ELIQUIS) 5 MG TABS tablet Take 1 tablet (5 mg total) by mouth 2 (two) times daily. 180 tablet 3  . ascorbic acid (VITAMIN C) 500 MG tablet  Take 500 mg by mouth daily.    Marland Kitchen aspirin EC 81 MG tablet Take 81 mg by mouth daily.    Marland Kitchen atorvastatin (LIPITOR) 20 MG tablet Take 20 mg by mouth at bedtime.     . carvedilol (COREG) 3.125 MG tablet Take 1/2 tablet by mouth twice daily with a meal    . Cholecalciferol (VITAMIN D3) 2000 units TABS Take 2,000 Units by mouth daily.    . DULoxetine (CYMBALTA) 30 MG capsule Take 90 mg by mouth at bedtime.     . Ferrous Sulfate (IRON) 325 (65 Fe) MG TABS TAKE 1 TABLET BY MOUTH TWICE DAILY WITH A MEAL 60 tablet 3  . furosemide (LASIX) 20 MG tablet Take 20 mg by mouth 2 (two) times daily.    Marland Kitchen levothyroxine (SYNTHROID) 200 MCG tablet Take 1 tablet (200 mcg total) by mouth daily at 6 (six) AM. 30 tablet 1  . magnesium oxide (MAG-OX) 400 MG tablet Take 1 tablet (400 mg total) by mouth daily. 90 tablet 3  . Multiple Vitamins-Minerals (MULTIVITAMIN PO) Take 1 tablet by mouth daily.    . pantoprazole (PROTONIX) 40 MG tablet Take 1 tablet (40 mg total) by mouth at bedtime. 30 tablet 6  . potassium chloride (K-DUR) 10 MEQ tablet Take 10 mEq by mouth 2 (two) times daily.    . sacubitril-valsartan (ENTRESTO) 49-51 MG Take 1 tablet by mouth 2 (two) times daily. 60 tablet 6  . spironolactone (ALDACTONE) 25 MG tablet Take 1 tablet (25 mg total) by mouth daily. 30 tablet 3  . vitamin B-12 (CYANOCOBALAMIN) 1000 MCG tablet Take 2,000 mcg by mouth daily.     No current facility-administered medications for this encounter.    Allergies  Allergen Reactions  . Codeine Other (See Comments) and Nausea Only    Increased work of breathing, diaphoretic  . Mirtazapine     Other reaction(s): Other - See Comments  . Prednisone     Anxiety and paranoia      Social History   Socioeconomic History  . Marital status: Widowed    Spouse name: Not on file  . Number of children: Not on file  . Years of education: Not on file  . Highest education level: Not on file  Occupational History  . Not on file  Tobacco Use  .  Smoking status: Former Smoker    Packs/day: 1.00    Years: 28.00    Pack years: 28.00    Types: Cigarettes    Start date: 1968    Quit date: 1996    Years since quitting: 25.9  . Smokeless tobacco: Never Used  Vaping Use  . Vaping Use:  Never used  Substance and Sexual Activity  . Alcohol use: No  . Drug use: No  . Sexual activity: Not on file  Other Topics Concern  . Not on file  Social History Narrative   Lives in [place]   Attends [employer]   Disabled carpenter   Social Determinants of Health   Financial Resource Strain: Not on file  Food Insecurity: Not on file  Transportation Needs: Not on file  Physical Activity: Not on file  Stress: Not on file  Social Connections: Not on file  Intimate Partner Violence: Not on file      Family History  Problem Relation Age of Onset  . Diabetes Mother   . Heart disease Mother   . Stroke Mother   . Heart attack Mother   . Heart disease Father   . Heart attack Father   . Stroke Father   . COPD Sister   . Congestive Heart Failure Sister   . Hypertension Sister   . Diabetes Sister   . Hypertension Brother   . Diabetes Sister   . Hypertension Sister   . Diabetes Sister   . Hypertension Sister     Vitals:   07/28/20 0940  BP: 110/70  Pulse: 72  SpO2: 98%  Weight: 86.2 kg (190 lb)    Wt Readings from Last 3 Encounters:  07/28/20 86.2 kg (190 lb)  07/07/20 86.9 kg (191 lb 9.6 oz)  06/30/20 86.2 kg (190 lb 0.6 oz)     General:  Well appearing. No resp difficulty HEENT: normal Neck: supple. no JVD. Carotids 2+ bilat; no bruits. No lymphadenopathy or thryomegaly appreciated. Cor: PMI nondisplaced. Regular rate & rhythm. No rubs, gallops or murmurs. Lungs: clear decreased thoughout Abdomen: soft, nontender, nondistended. No hepatosplenomegaly. No bruits or masses. Good bowel sounds. Extremities: no cyanosis, clubbing, rash, edema Neuro: alert & orientedx3, cranial nerves grossly intact. moves all 4  extremities w/o difficulty. Affect pleasant  ICD interrogation.  No VT. Been in NSR since 06/29/20. Activity level ~1hr/day. Volume status up but impedance getting back to baseline. Personally reviewed   ECG: NSR 75 1 AVB IVCD 264m Personally reviewed  ASSESSMENT & PLAN:  1.Chronic systolic CHF: ICM, EF 102-40%June 2018 s/p MDT ICD - Echo 6/19 reviewed personally. EF 20%. LV markedly dilated and spherical. RV ok - Echo 9/21 EF 20% RV ok   - Improved NYHA III - Recent course complicated by recurrent AF - Volume status has been up recently on ICM download. Has been on lasix 20 bid but self adjusts - Continue Entresto to 49/51. BP too soft to titrate - Continue spiro (despite mild gynecomastia - he does not think [facility] will approve Inspra)  - Increase carvedilol 3.125 mg BID.  - Failed Farxiga due to SEs - Digoxin stopped by AF Clinic with amio load in 10/21 - Refuses VAD - Labs today - May benefit from upgrade to CRT but need to keep out of AF first    2. CAD s/p previous MI - Cath 7/1 with severe 3 v CAD with multiple areas of ISR - s/p 2v PCI 02/22/17 by Dr. CBurt Knack -  Anterior wall is non-viable given previous LAD stent thrombosis. 24-hour Thallium viability study showed minimal or no viability in all segments except for septum - No significant s/s ischemia - Off ASA and Brilinta. Continue Eliqiuis 5 mg BID. No bleeding - Continue Crestor.   3. VT - ICD shock on 8/21. No recurrence - Keep  K > 4.0 Mg > 2.0   4. Persistent AF - CHADSVASC = 3 (CHF, HTN, CAD) - s/p  DC-CV 03/09/18. S/P DC-CV 01/2019  - s/p AF ablation 12/20.  - recurrent AF with DC-CV 9/21 & 11/21 (amio restarted in 10/21) - Continue Eliquis 5 mg BID  - Follows with AF Clinic. Remains in NSR. Continue amio 200 daily - Check sleep study  5  Chronic hypoxic respiratory failure with h/o left pleural effusion - S/p thoracentesis in 6/18 - No recurrence. Did not de-sat when walked around office 05/04/18.   6.  COPD - Per PCP and pulmonary. No wheeze on exam.  - PFTs 05/04/18 FEV1 3.03 (87%), FVC 77%, TLC 6.14 (87%), DLCO 14.4 (44%) - Ambulated in clinic and O2 sat remained > 96% for the entirety of walking.   7. Hepatitis C - Hepatitis C Antibody positive during work up labs. He states he underwent therapy with Harvoni in 2015 through the [place].   - No further testing warranted at this time with Harvoni. Pt states he can bring his certificate that says he is "cured" if needed.   8. LBBB/IVCD - QRS ~200 ms. ? If he would benefit from device upgrade.  - Dr. Rayann Heman has seen and planned to assess for CRT upgrade once AF more stable  9. Hypothyroidism  - long-standing. Previously followed by Dr. Renne Crigler - Now being followed by the [facility] - Synthroid adjusted recently   Glori Bickers, MD 07/28/20

## 2020-07-28 ENCOUNTER — Ambulatory Visit (HOSPITAL_COMMUNITY)
Admission: RE | Admit: 2020-07-28 | Discharge: 2020-07-28 | Disposition: A | Discharge status: Home / Self Care | Payer: Medicare Other | Source: Ambulatory Visit | Attending: Internal Medicine | Admitting: Internal Medicine

## 2020-07-28 ENCOUNTER — Encounter (HOSPITAL_COMMUNITY): Payer: Self-pay | Admitting: Internal Medicine

## 2020-07-28 ENCOUNTER — Other Ambulatory Visit: Payer: Self-pay

## 2020-07-28 VITALS — BP 110/70 | HR 72 | Wt 190.0 lb

## 2020-07-28 DIAGNOSIS — Z7901 Long term (current) use of anticoagulants: Secondary | ICD-10-CM | POA: Insufficient documentation

## 2020-07-28 DIAGNOSIS — Z8709 Personal history of other diseases of the respiratory system: Secondary | ICD-10-CM | POA: Diagnosis not present

## 2020-07-28 DIAGNOSIS — Z7982 Long term (current) use of aspirin: Secondary | ICD-10-CM | POA: Insufficient documentation

## 2020-07-28 DIAGNOSIS — Z87891 Personal history of nicotine dependence: Secondary | ICD-10-CM | POA: Insufficient documentation

## 2020-07-28 DIAGNOSIS — I2510 Atherosclerotic heart disease of native coronary artery without angina pectoris: Secondary | ICD-10-CM | POA: Diagnosis not present

## 2020-07-28 DIAGNOSIS — Z79890 Hormone replacement therapy (postmenopausal): Secondary | ICD-10-CM | POA: Insufficient documentation

## 2020-07-28 DIAGNOSIS — Z885 Allergy status to narcotic agent status: Secondary | ICD-10-CM | POA: Diagnosis not present

## 2020-07-28 DIAGNOSIS — Z95810 Presence of automatic (implantable) cardiac defibrillator: Secondary | ICD-10-CM | POA: Diagnosis not present

## 2020-07-28 DIAGNOSIS — E039 Hypothyroidism, unspecified: Secondary | ICD-10-CM | POA: Insufficient documentation

## 2020-07-28 DIAGNOSIS — Z955 Presence of coronary angioplasty implant and graft: Secondary | ICD-10-CM | POA: Diagnosis not present

## 2020-07-28 DIAGNOSIS — I110 Hypertensive heart disease with heart failure: Secondary | ICD-10-CM | POA: Diagnosis not present

## 2020-07-28 DIAGNOSIS — Z79899 Other long term (current) drug therapy: Secondary | ICD-10-CM | POA: Diagnosis not present

## 2020-07-28 DIAGNOSIS — I4819 Other persistent atrial fibrillation: Secondary | ICD-10-CM | POA: Insufficient documentation

## 2020-07-28 DIAGNOSIS — G4733 Obstructive sleep apnea (adult) (pediatric): Secondary | ICD-10-CM | POA: Diagnosis not present

## 2020-07-28 DIAGNOSIS — J449 Chronic obstructive pulmonary disease, unspecified: Secondary | ICD-10-CM | POA: Diagnosis not present

## 2020-07-28 DIAGNOSIS — I447 Left bundle-branch block, unspecified: Secondary | ICD-10-CM | POA: Diagnosis not present

## 2020-07-28 DIAGNOSIS — Z888 Allergy status to other drugs, medicaments and biological substances status: Secondary | ICD-10-CM | POA: Insufficient documentation

## 2020-07-28 DIAGNOSIS — I472 Ventricular tachycardia: Secondary | ICD-10-CM | POA: Diagnosis not present

## 2020-07-28 DIAGNOSIS — J9611 Chronic respiratory failure with hypoxia: Secondary | ICD-10-CM | POA: Diagnosis not present

## 2020-07-28 DIAGNOSIS — I5022 Chronic systolic (congestive) heart failure: Secondary | ICD-10-CM

## 2020-07-28 DIAGNOSIS — Z9989 Dependence on other enabling machines and devices: Secondary | ICD-10-CM | POA: Diagnosis not present

## 2020-07-28 DIAGNOSIS — B1920 Unspecified viral hepatitis C without hepatic coma: Secondary | ICD-10-CM | POA: Insufficient documentation

## 2020-07-28 DIAGNOSIS — I480 Paroxysmal atrial fibrillation: Secondary | ICD-10-CM

## 2020-07-28 DIAGNOSIS — I252 Old myocardial infarction: Secondary | ICD-10-CM | POA: Insufficient documentation

## 2020-07-28 LAB — CBC
HCT: 40.6 % (ref 39.0–52.0)
Hemoglobin: 13.1 g/dL (ref 13.0–17.0)
MCH: 31.6 pg (ref 26.0–34.0)
MCHC: 32.3 g/dL (ref 30.0–36.0)
MCV: 98.1 fL (ref 80.0–100.0)
Platelets: 297 10*3/uL (ref 150–400)
RBC: 4.14 MIL/uL — ABNORMAL LOW (ref 4.22–5.81)
RDW: 18.6 % — ABNORMAL HIGH (ref 11.5–15.5)
WBC: 5.9 10*3/uL (ref 4.0–10.5)
nRBC: 0 % (ref 0.0–0.2)

## 2020-07-28 LAB — COMPREHENSIVE METABOLIC PANEL
ALT: 211 U/L — ABNORMAL HIGH (ref 0–44)
AST: 51 U/L — ABNORMAL HIGH (ref 15–41)
Albumin: 3.4 g/dL — ABNORMAL LOW (ref 3.5–5.0)
Alkaline Phosphatase: 79 U/L (ref 38–126)
Anion gap: 14 (ref 5–15)
BUN: 16 mg/dL (ref 8–23)
CO2: 27 mmol/L (ref 22–32)
Calcium: 9.3 mg/dL (ref 8.9–10.3)
Chloride: 98 mmol/L (ref 98–111)
Creatinine, Ser: 1.31 mg/dL — ABNORMAL HIGH (ref 0.61–1.24)
GFR, Estimated: >60 mL/min (ref >60)
Glucose, Bld: 163 mg/dL — ABNORMAL HIGH (ref 70–99)
Potassium: 3.2 mmol/L — ABNORMAL LOW (ref 3.5–5.1)
Sodium: 139 mmol/L (ref 135–145)
Total Bilirubin: 1.4 mg/dL — ABNORMAL HIGH (ref 0.3–1.2)
Total Protein: 6.8 g/dL (ref 6.5–8.1)

## 2020-07-28 LAB — BRAIN NATRIURETIC PEPTIDE: B Natriuretic Peptide: 2345.7 pg/mL — ABNORMAL HIGH (ref 0.0–100.0)

## 2020-07-28 MED ORDER — FUROSEMIDE 20 MG PO TABS: ORAL_TABLET | ORAL | 3 refills | Status: AC

## 2020-07-28 MED ORDER — CARVEDILOL 3.125 MG PO TABS: 3.1250 mg | ORAL_TABLET | Freq: Two times a day (BID) | ORAL | 3 refills | Status: AC

## 2020-07-28 MED ORDER — CARVEDILOL 3.125 MG PO TABS: 3.1250 mg | ORAL_TABLET | Freq: Two times a day (BID) | ORAL | 3 refills | Status: DC

## 2020-07-28 NOTE — Addendum Note (Signed)
Encounter addended by: Scarlette Calico, RN on: 07/28/2020 10:42 AM  Actions taken: Diagnosis association updated, Pharmacy for encounter modified, Order list changed, Clinical Note Signed, Charge Capture section accepted

## 2020-07-28 NOTE — Addendum Note (Signed)
Encounter addended by: Scarlette Calico, RN on: 07/28/2020 10:47 AM  Actions taken: Order list changed

## 2020-07-28 NOTE — Patient Instructions (Signed)
Increase Furosemide to 40 mg (2 tabs) in AM and 20 mg (1 tab) in PM  Increase Carvedilol to 3.125 mg Twice daily   Labs done today, we will call you for abnormal results  Your physician recommends that you schedule a follow-up appointment in: 3 months  If you have any questions or concerns before your next appointment please send [place] a message through [place] or call our office at [phone].    TO LEAVE A MESSAGE FOR THE NURSE SELECT OPTION 2, PLEASE LEAVE A MESSAGE INCLUDING: . YOUR NAME . DATE OF BIRTH . CALL BACK NUMBER . REASON FOR CALL**this is important as we prioritize the call backs  [place] AS LONG AS YOU CALL BEFORE 4:00 PM  At the [facility], you and your health needs are our priority. As part of our continuing mission to provide you with exceptional heart care, we have created designated Provider Care Teams. These Care Teams include your primary Cardiologist (physician) and Advanced Practice Providers (APPs- Physician Assistants and Nurse Practitioners) who all work together to provide you with the care you need, when you need it.   You may see any of the following providers on your designated Care Team at your next follow up: Marland Kitchen Dr Glori Bickers . Dr Loralie Champagne . Darrick Grinder, NP . Lyda Jester, PA . Audry Riles, PharmD   Please be sure to bring in all your medications bottles to every appointment.

## 2020-07-28 NOTE — Addendum Note (Signed)
Encounter addended by: Scarlette Calico, RN on: 07/28/2020 10:24 AM  Actions taken: Flowsheet accepted, Clinical Note Signed

## 2020-07-28 NOTE — Progress Notes (Signed)
ReDS Vest / Clip - 07/28/20 1000      ReDS Vest / Clip   Station Marker C    Ruler Value 28    ReDS Value Range High volume overload    ReDS Actual Value 43

## 2020-07-29 ENCOUNTER — Ambulatory Visit (INDEPENDENT_AMBULATORY_CARE_PROVIDER_SITE_OTHER): Payer: Medicare Other

## 2020-07-29 DIAGNOSIS — I5022 Chronic systolic (congestive) heart failure: Secondary | ICD-10-CM

## 2020-07-29 DIAGNOSIS — Z95810 Presence of automatic (implantable) cardiac defibrillator: Secondary | ICD-10-CM | POA: Diagnosis not present

## 2020-07-29 DIAGNOSIS — I480 Paroxysmal atrial fibrillation: Secondary | ICD-10-CM

## 2020-07-29 LAB — CUP PACEART REMOTE DEVICE CHECK
Battery Remaining Longevity: 23 mo
Battery Voltage: 2.94 V
Brady Statistic AP VP Percent: 0.01 %
Brady Statistic AP VS Percent: 0.11 %
Brady Statistic AS VP Percent: 0.09 %
Brady Statistic AS VS Percent: 99.8 %
Brady Statistic RA Percent Paced: 0.11 %
Brady Statistic RV Percent Paced: 0.1 %
Date Time Interrogation Session: 20211214022723
HighPow Impedance: 63 Ohm
Implantable Lead Implant Date: 20140813
Implantable Lead Implant Date: 20140813
Implantable Lead Location: 753859
Implantable Lead Location: 753860
Implantable Lead Model: 5076
Implantable Pulse Generator Implant Date: 20140813
Lead Channel Impedance Value: 285 Ohm
Lead Channel Impedance Value: 361 Ohm
Lead Channel Impedance Value: 361 Ohm
Lead Channel Pacing Threshold Amplitude: 0.875 V
Lead Channel Pacing Threshold Amplitude: 1.25 V
Lead Channel Pacing Threshold Pulse Width: 0.4 ms
Lead Channel Pacing Threshold Pulse Width: 0.4 ms
Lead Channel Sensing Intrinsic Amplitude: 20.5 mV
Lead Channel Sensing Intrinsic Amplitude: 20.5 mV
Lead Channel Sensing Intrinsic Amplitude: 3.625 mV
Lead Channel Sensing Intrinsic Amplitude: 3.625 mV
Lead Channel Setting Pacing Amplitude: 1.75 V
Lead Channel Setting Pacing Amplitude: 2.5 V
Lead Channel Setting Pacing Pulse Width: 0.4 ms
Lead Channel Setting Sensing Sensitivity: 0.3 mV

## 2020-08-01 NOTE — Progress Notes (Signed)
EPIC Encounter for ICM Monitoring  Patient Name: Stephen Cardenas is a 64 y.o. male Date: 08/01/2020 Primary Care Physican: Administration, [facility] Primary Cardiologist:Bensimhon Electrophysiologist:Allred 07/28/2020 Office Weight:190lbs  Time in AT/AF (0.0%)  Transmission reviewed.  Patient had office visit with Dr Haroldine Laws 07/28/2020  Optivol thoracic impedance suggesting fluid levelsreturned to normal  Prescribed:  Furosemide20 mgtake2tablets (40 mg total)every morning and 1 tablet (20 mg total) every evening.Per 07/28/20 Dr Haroldine Laws note, he is aware patient self adjusts Furosemide  Potassium10 mEq take1tablettwice a day.  Spironolactone 25 mg take 1 tablet daily  Eliquis 5 mg take 1 tablet twice a day  Labs: 07/28/2020 Creatinine 1.31, BUN 16, Potassium 3.2, Sodium 139, GFR >60 06/19/2020 Creatinine 1.17, BUN 13, Potassium 4.7, Sodium 137  05/26/2020 Creatinine 1.12, BUN 14, Potassium 4.4, Sodium 137, GFR >60  10/08/2019 Creatinine 1.35, BUN 14, Potassium 3.8, Sodium 137, GFR 55->60 07/20/2019 Creatinine 1.28, BUN 20, Potassium 4.6, Sodium 138, GFR 58-67 A complete set of results can be found in Results Review.  Recommendations were given at 07/28/20 office visit.  Follow-up plan: ICM clinic phone appointment on2/04/2021.91 day device clinic remote transmission 10/28/2020.  EP/Cardiology Office Visits:08/22/2020 with Dr Rayann Heman.  10/27/2020 with Dr.Bensimhon.   Copy of ICM check sent to Dr.Allred   3 month ICM trend: 07/29/2020    1 Year ICM trend:       [name], RN 08/01/2020 1:04 PM

## 2020-08-04 ENCOUNTER — Telehealth (HOSPITAL_COMMUNITY): Payer: Self-pay

## 2020-08-04 NOTE — Telephone Encounter (Signed)
Malena Edman, RN  08/04/2020 10:49 AM EST Back to Top     Tried calling patient, no answer, unable to leave message. Letter mailed to address on file   Malena Edman, RN  08/01/2020 9:22 AM EST      Tried calling patient, no answer, unable to leave message. Will call again later   Malena Edman, RN  07/31/2020 2:34 PM EST      Tried calling patient, no answer, unable to leave message. Will call again later

## 2020-08-04 NOTE — Telephone Encounter (Signed)
-----   Message from Jolaine Artist, MD sent at 07/31/2020  2:18 PM EST ----- Please have him double potassium to 20 bid. Take an extra 40 meq today only.

## 2020-08-11 ENCOUNTER — Telehealth (HOSPITAL_COMMUNITY): Payer: Self-pay

## 2020-08-11 MED ORDER — POTASSIUM CHLORIDE ER 20 MEQ PO TBCR: 20.0000 meq | EXTENDED_RELEASE_TABLET | Freq: Two times a day (BID) | ORAL | 3 refills | Status: AC

## 2020-08-11 NOTE — Telephone Encounter (Signed)
-----   Message from Dolores Patty, MD sent at 07/31/2020  2:18 PM EST ----- Please have him double potassium to 20 bid. Take an extra 40 meq today only.    Patient called about lab results. Patient advised and verbalized understanding. Med list updated to reflect changes.

## 2020-08-13 NOTE — Progress Notes (Signed)
Remote ICD transmission.   

## 2020-08-18 ENCOUNTER — Other Ambulatory Visit (HOSPITAL_COMMUNITY): Payer: Self-pay | Admitting: Nurse Practitioner

## 2020-08-18 ENCOUNTER — Other Ambulatory Visit (HOSPITAL_COMMUNITY): Payer: Self-pay | Admitting: *Deleted

## 2020-08-22 ENCOUNTER — Encounter: Payer: Self-pay | Admitting: Internal Medicine

## 2020-08-22 ENCOUNTER — Ambulatory Visit (INDEPENDENT_AMBULATORY_CARE_PROVIDER_SITE_OTHER): Payer: Medicare Other | Admitting: Internal Medicine

## 2020-08-22 VITALS — BP 120/90 | HR 76 | Ht 70.0 in | Wt 191.4 lb

## 2020-08-22 DIAGNOSIS — I480 Paroxysmal atrial fibrillation: Secondary | ICD-10-CM

## 2020-08-22 DIAGNOSIS — I472 Ventricular tachycardia, unspecified: Secondary | ICD-10-CM

## 2020-08-22 DIAGNOSIS — I5022 Chronic systolic (congestive) heart failure: Secondary | ICD-10-CM

## 2020-08-22 DIAGNOSIS — I4819 Other persistent atrial fibrillation: Secondary | ICD-10-CM

## 2020-08-22 DIAGNOSIS — I255 Ischemic cardiomyopathy: Secondary | ICD-10-CM

## 2020-08-22 NOTE — Patient Instructions (Addendum)
Medication Instructions:   Your physician recommends that you continue on your current medications as directed. Please refer to the Current Medication list given to you today.  Labwork:  Your physician recommends that you return for lab work in: as soon as possible at [facility].  Testing/Procedures:  None  Follow-Up:  Your physician recommends that you schedule a follow-up appointment in: 3 months.  Any Other Special Instructions Will Be Listed Below (If Applicable).  If you need a refill on your cardiac medications before your next appointment, please call your pharmacy.

## 2020-08-22 NOTE — Progress Notes (Signed)
PCP: Administration, Veterans Primary Cardiologist: Dr Haroldine Laws Primary EP: Dr Rayann Heman  Stephen Cardenas is a 65 y.o. male who presents today for routine electrophysiology followup.  Since cardioversion, he has done well.  Reports marked improvement in energy and breathing. Today, he denies symptoms of palpitations, chest pain, shortness of breath,  lower extremity edema, dizziness, presyncope, syncope, or ICD shocks.   His primary concern is with back pain today. The patient is otherwise without complaint today.   Past Medical History:  Diagnosis Date  . AICD (automatic cardioverter/defibrillator) present 2014  . Atrial fibrillation ([place])   . CAD (coronary artery disease)    S/p multiple prior MIs/PCIs // amdx in 2014 with acute stent thrombosis c/b CGS // Admx to [facility] in 10/5007 2/2 a/c systolic HF >> [facility] 10/21/16: mLAD 95 ISR, dLAD 30, D2 99; OM2 95 ISR; pRCA 30/50, mRCA 90/80, dRCA 30 EF 15 >> min to no viability on Thallium study (not surgical candidate) - PCI>> 3.5 x 20 mm Promus DES to the LCx 3 x 38 mm Promus DES to the proximal mid RCA  . CHF (congestive heart failure) ([place])   . Chronic systolic heart failure (HCC)    2/2 to ischemic CM - Echo 02/12/17:  Mild LVH, EF 15-20, mod to severe MR, severe LAE, mild RVE, mod reduced RVSF, mild RAE, mod TR, PASP 65  . COPD (chronic obstructive pulmonary disease) ([place])   . History of MI (myocardial infarction)   . History of pneumonia   . HTN (hypertension)   . Hyperlipidemia 03/08/2017  . Hypothyroidism   . OSA (obstructive sleep apnea) 03/08/2017  . Polymorphic ventricular tachycardia ([place]) 04/01/2020   appropriate ICD shock delivered   Past Surgical History:  Procedure Laterality Date  . ATRIAL FIBRILLATION ABLATION N/A 07/24/2019   Procedure: ATRIAL FIBRILLATION ABLATION;  Surgeon: Thompson Grayer, MD;  Location: [place] [facility] LAB;  Service: Cardiovascular;  Laterality: N/A;  . CARDIAC DEFIBRILLATOR PLACEMENT  2014   Medtronic Every XT DR  ICD implanted at the [facility] for primary prevention  . CARDIOVERSION N/A 03/09/2018   Procedure: CARDIOVERSION;  Surgeon: Jolaine Artist, MD;  Location: [facility] ENDOSCOPY;  Service: Cardiovascular;  Laterality: N/A;  . CARDIOVERSION N/A 05/01/2020   Procedure: CARDIOVERSION;  Surgeon: Arnoldo Lenis, MD;  Location: [facility] ENDO SUITE;  Service: Endoscopy;  Laterality: N/A;  . CARDIOVERSION N/A 06/30/2020   Procedure: CARDIOVERSION;  Surgeon: Dorothy Spark, MD;  Location: [facility] ENDOSCOPY;  Service: Cardiovascular;  Laterality: N/A;  . CORONARY STENT INTERVENTION N/A 02/22/2017   Procedure: Coronary Stent Intervention;  Surgeon: Sherren Mocha, MD;  Location: [place] [facility] LAB;  Service: Cardiovascular;  Laterality: N/A;  CFX and RCA  . FACIAL RECONSTRUCTION SURGERY Right 1984  . FEMUR FRACTURE SURGERY Right 1984  . RIGHT/LEFT HEART CATH AND CORONARY ANGIOGRAPHY N/A 02/14/2017   Procedure: Right/Left Heart Cath and Coronary Angiography;  Surgeon: Jolaine Artist, MD;  Location: [place] [facility] LAB;  Service: Cardiovascular;  Laterality: N/A;  . WRIST FRACTURE SURGERY Right 2015    ROS- all systems are reviewed and negative except as per HPI above  Current Outpatient Medications  Medication Sig Dispense Refill  . ALPRAZolam (XANAX) 0.5 MG tablet Take 0.5-1 mg by mouth 3 (three) times daily as needed for anxiety.     Marland Kitchen amiodarone (PACERONE) 200 MG tablet Take 1 tablet (200 mg total) by mouth daily. 30 tablet 3  . apixaban (ELIQUIS) 5 MG TABS tablet Take 1 tablet (5 mg  total) by mouth 2 (two) times daily. 180 tablet 3  . ascorbic acid (VITAMIN C) 500 MG tablet Take 500 mg by mouth daily.    Marland Kitchen aspirin EC 81 MG tablet Take 81 mg by mouth daily.    Marland Kitchen atorvastatin (LIPITOR) 20 MG tablet Take 20 mg by mouth at bedtime.     . carvedilol (COREG) 3.125 MG tablet Take 1 tablet (3.125 mg total) by mouth 2 (two) times daily with a meal. 60 tablet 3  . Cholecalciferol (VITAMIN D3) 2000 units TABS Take  2,000 Units by mouth daily.    . DULoxetine (CYMBALTA) 30 MG capsule Take 90 mg by mouth at bedtime.     . Ferrous Sulfate (IRON) 325 (65 Fe) MG TABS TAKE 1 TABLET BY MOUTH TWICE DAILY WITH A MEAL 60 tablet 3  . furosemide (LASIX) 20 MG tablet Take 2 tablets (40 mg total) by mouth in the morning AND 1 tablet (20 mg total) every evening. 60 tablet 3  . levothyroxine (SYNTHROID) 200 MCG tablet Take 1 tablet (200 mcg total) by mouth daily at 6 (six) AM. 30 tablet 1  . magnesium oxide (MAG-OX) 400 MG tablet Take 1 tablet (400 mg total) by mouth daily. 90 tablet 3  . Multiple Vitamins-Minerals (MULTIVITAMIN PO) Take 1 tablet by mouth daily.    . pantoprazole (PROTONIX) 40 MG tablet Take 1 tablet (40 mg total) by mouth at bedtime. 30 tablet 6  . potassium chloride 20 MEQ TBCR Take 20 mEq by mouth 2 (two) times daily. 180 tablet 3  . sacubitril-valsartan (ENTRESTO) 49-51 MG Take 1 tablet by mouth 2 (two) times daily. 60 tablet 6  . spironolactone (ALDACTONE) 25 MG tablet Take 1 tablet (25 mg total) by mouth daily. 30 tablet 3  . vitamin B-12 (CYANOCOBALAMIN) 1000 MCG tablet Take 2,000 mcg by mouth daily.     No current facility-administered medications for this visit.    Physical Exam: Vitals:   08/22/20 1119  BP: 120/90  Pulse: 76  SpO2: 96%  Weight: 191 lb 6.4 oz (86.8 kg)  Height: 5\' 10"  (1.778 m)    GEN- The patient is well appearing, alert and oriented x 3 today.   Head- normocephalic, atraumatic Eyes-  Sclera clear, conjunctiva pink Ears- hearing intact Oropharynx- clear Lungs-   normal work of breathing Chest- ICD pocket is well healed Heart- Regular rate and rhythm  GI- soft  Extremities- no clubbing, cyanosis, or edema  ICD remotes- reviewed in detail today,  See PACEART report  ekg tracing ordered today is personally reviewed and shows sinus with first degree AV block and LBBB (QRS > 150 msec)  Wt Readings from Last 3 Encounters:  08/22/20 191 lb 6.4 oz (86.8 kg)   07/28/20 190 lb (86.2 kg)  07/07/20 191 lb 9.6 oz (86.9 kg)    Assessment and Plan:  1.  Chronic systolic dysfunction/ ischemic CM/ LBBB euvolemic today Stable on an appropriate medical regimen Normal ICD function by remotes he is not device dependant today I agree with Dr Haroldine Laws that we should consider upgrade to CRT (class I indication).  He has significant back pain at this time and does not think that he could undergo the procedure.  We did discuss at length. Continue medical therapy and return to see me in 3 months for further assessment  2. Persistent afib Improved with amiodarone No changes today Continue eliquis for chads2vasc score of 4  3. Elevated LFTS Recently noted Repeat lfts today  4. HTN  Stable No change required today  5. Polymorphic VT Normal ICD function by remotes (up to date) Controlled with amiodarone    Risks, benefits and potential toxicities for medications prescribed and/or refilled reviewed with patient today.   Return in 3 months for further assessment  Thompson Grayer MD, [facility] 08/22/2020 11:44 AM

## 2020-08-25 DIAGNOSIS — I480 Paroxysmal atrial fibrillation: Secondary | ICD-10-CM | POA: Diagnosis not present

## 2020-09-04 ENCOUNTER — Encounter: Payer: Self-pay | Admitting: *Deleted

## 2020-09-13 ENCOUNTER — Other Ambulatory Visit: Payer: Self-pay | Admitting: Internal Medicine

## 2020-09-24 ENCOUNTER — Ambulatory Visit (INDEPENDENT_AMBULATORY_CARE_PROVIDER_SITE_OTHER): Payer: Medicare Other

## 2020-09-24 ENCOUNTER — Telehealth: Payer: Self-pay

## 2020-09-24 DIAGNOSIS — I5022 Chronic systolic (congestive) heart failure: Secondary | ICD-10-CM | POA: Diagnosis not present

## 2020-09-24 DIAGNOSIS — Z95810 Presence of automatic (implantable) cardiac defibrillator: Secondary | ICD-10-CM

## 2020-09-24 NOTE — Progress Notes (Signed)
EPIC Encounter for ICM Monitoring  Patient Name: Stephen Cardenas is a 65 y.o. male Date: 09/24/2020 Primary Care Physican: Administration, Veterans Primary Cardiologist:Bensimhon Electrophysiologist:Allred 07/28/2020 Office Weight:190lbs  Time in AT/AF <0.1 hr/day (<0.1%)  Attempted call to patient and unable to reach.  Left detailed message per DPR regarding transmission. Transmission reviewed.   Optivol thoracic impedance suggesting possible ongoing fluid accumulation since 07/31/2020.    Prescribed:  Furosemide20 mgtake2tablets (40 mg total)every morning and 1 tablet (20 mg total) every evening.Per 07/28/20 Dr Haroldine Laws note, he is aware patient self adjusts Furosemide  Potassium20 mEq take1tablettwice a day.  Spironolactone 25 mg take 1 tablet daily  Eliquis 5 mg take 1 tablet twice a day  Labs: 07/28/2020 Creatinine 1.31, BUN 16, Potassium 3.2, Sodium 139, GFR >60 06/19/2020 Creatinine 1.17, BUN 13, Potassium 4.7, Sodium 137  05/26/2020 Creatinine 1.12, BUN 14, Potassium 4.4, Sodium 137, GFR >60  10/08/2019 Creatinine 1.35, BUN 14, Potassium 3.8, Sodium 137, GFR 55->60 07/20/2019 Creatinine 1.28, BUN 20, Potassium 4.6, Sodium 138, GFR 58-67 A complete set of results can be found in Results Review.  Recommendations: Left voice mail with ICM number and encouraged to call if experiencing any fluid symptoms.   Patient self adjusts Furosemide as needed.  Follow-up plan: ICM clinic phone appointment on2/28/2022 to recheck fluid levels.91 day device clinic remote transmission 10/28/2020.  EP/Cardiology Office Visits:10/27/2020 with Dr Rayann Heman.  10/27/2020 with Dr.Bensimhon.   Copy of ICM check sent to Dr.Allred    3 month ICM trend: 09/24/2020.    1 Year ICM trend:       [name], RN 09/24/2020 1:05 PM

## 2020-09-24 NOTE — Telephone Encounter (Signed)
Remote ICM transmission received.  Attempted call to patient regarding ICM remote transmission and left detailed message per DPR.  Advised to return call for any fluid symptoms or questions.  

## 2020-10-06 ENCOUNTER — Telehealth: Payer: Self-pay | Admitting: *Deleted

## 2020-10-06 NOTE — Telephone Encounter (Signed)
-----   Message from Thompson Grayer, MD sent at 09/28/2020 12:41 PM EST ----- Results reviewed.  Please inform pt of result.

## 2020-10-06 NOTE — Telephone Encounter (Signed)
Laurine Blazer, LPN  04/28/6858 9:23 PM EST Back to Top     Voice mailbox not set up yet.

## 2020-10-09 NOTE — Telephone Encounter (Signed)
Laurine Blazer, LPN  02/24/9289 9:03 PM EST Back to Top     Notified, copy to pcp.    Via detailed voice message.

## 2020-10-10 ENCOUNTER — Telehealth: Payer: Self-pay | Admitting: Internal Medicine

## 2020-10-10 NOTE — Telephone Encounter (Signed)
Patient returning call to [facility] for Results

## 2020-10-13 ENCOUNTER — Ambulatory Visit (INDEPENDENT_AMBULATORY_CARE_PROVIDER_SITE_OTHER): Payer: Medicare Other

## 2020-10-13 DIAGNOSIS — Z95810 Presence of automatic (implantable) cardiac defibrillator: Secondary | ICD-10-CM

## 2020-10-13 DIAGNOSIS — I5022 Chronic systolic (congestive) heart failure: Secondary | ICD-10-CM

## 2020-10-13 NOTE — Progress Notes (Signed)
EPIC Encounter for ICM Monitoring  Patient Name: Stephen Cardenas is a 65 y.o. male Date: 10/13/2020 Primary Care Physican: Administration, Veterans Primary Cardiologist:Bensimhon Electrophysiologist:Allred 2/28/2022Weight:191lbs  Time in AT/AF  (0.0%)  Spoke with patient and reports feeling very fatigued, shortness when going up and down steps or bringing groceries from the car into the house.    Optivol thoracic impedancesuggesting possible ongoing fluid accumulation since 07/31/2020 with a few days at baseline in the last 2 weeks.    Prescribed:  Furosemide20 mgtake2tablets(40 mg total)every morning and 1 tablet (20 mg total) every evening.  Potassium20 mEq take1tablettwice a day.  Spironolactone 25 mg take 1 tablet daily  Eliquis 5 mg take 1 tablet twice a day  Labs: 07/28/2020 Creatinine1.31, BUN16, Potassium3.2, Sodium139, GFR>60 06/19/2020 Creatinine1.17, BUN13, Potassium4.7, [id number]  05/26/2020 Creatinine1.12, BUN14, Potassium4.4, Sodium137, GFR>60 10/08/2019 Creatinine 1.35, BUN 14, Potassium 3.8, Sodium 137, GFR 55->60 07/20/2019 Creatinine 1.28, BUN 20, Potassium 4.6, Sodium 138, GFR 58-67 A complete set of results can be found in Results Review.  Recommendations:  Copy sent to Dr Haroldine Laws for review and recommendations if needed.  Confirmed patient is taking Furosemide 40mg  AM & 20 mg PM  Follow-up plan: ICM clinic phone appointment on3/02/2021 to recheck fluid levels.91 day device clinic remote transmission3/15/2022.  EP/Cardiology Office Visits:10/27/2020 with Dr Rayann Heman. 3/14/2022with Dr.Bensimhon.   Copy of ICM check sent to Dr.Allred   3 month ICM trend: 10/13/2020.    1 Year ICM trend:       [name], RN 10/13/2020 11:16 AM

## 2020-10-20 ENCOUNTER — Telehealth: Payer: Self-pay

## 2020-10-20 ENCOUNTER — Ambulatory Visit (INDEPENDENT_AMBULATORY_CARE_PROVIDER_SITE_OTHER): Payer: Medicare Other

## 2020-10-20 DIAGNOSIS — Z95810 Presence of automatic (implantable) cardiac defibrillator: Secondary | ICD-10-CM

## 2020-10-20 DIAGNOSIS — I5022 Chronic systolic (congestive) heart failure: Secondary | ICD-10-CM

## 2020-10-20 NOTE — Telephone Encounter (Signed)
Carelink alert received- Alert for RV Lead Integrity warning on 20-Oct-2020 (2 or more criteria met). Review High Rate-NS episodes, Sensing Integrity Counter (Short V-V Intervals) and RV Lead Impedance. The oldest High Rate-NS episode associated with this observation is dated 01-Apr-2020.   No NS episodes on report to review.    Attempted to reach pt to schedule for in-clinic check due to change in RV lead impendence.  No answer, LVM requesting callback.

## 2020-10-22 NOTE — Progress Notes (Signed)
EPIC Encounter for ICM Monitoring  Patient Name: Stephen Cardenas is a 65 y.o. male Date: 10/22/2020 Primary Care Physican: Administration, Veterans Primary Cardiologist:Bensimhon Electrophysiologist:Allred 2/28/2022Weight:191lbs  Time in AT/AF  (0.0%)  Transmission reviewed.   Device clinic nurse attempted to contact patient for Carelink Alert received for RV Lead Integrity warning on 20-Oct-2020   Optivol thoracic impedancesuggestingpossible ongoing fluid accumulation since 07/31/2020 with a few days at baseline in the last 3 weeks.Impedance is starting to trend back toward baseline  Prescribed:  Furosemide20 mgtake2tablets(40 mg total)every morning and 1 tablet (20 mg total) every evening.  Potassium20 mEq take1tablettwice a day.  Spironolactone 25 mg take 1 tablet daily  Eliquis 5 mg take 1 tablet twice a day  Labs: 07/28/2020 Creatinine1.31, BUN16, Potassium3.2, Sodium139, GFR>60 06/19/2020 Creatinine1.17, BUN13, Potassium4.7, [id number]  05/26/2020 Creatinine1.12, BUN14, Potassium4.4, Sodium137, GFR>60 10/08/2019 Creatinine 1.35, BUN 14, Potassium 3.8, Sodium 137, GFR 55->60 07/20/2019 Creatinine 1.28, BUN 20, Potassium 4.6, Sodium 138, GFR 58-67 A complete set of results can be found in Results Review.  Recommendations: No changes and patient has follow up appointment with Dr Haroldine Laws 10/27/2020  Follow-up plan: ICM clinic phone appointment on3/28/2022.91 day device clinic remote transmission3/15/2022.  EP/Cardiology Office Visits:5/6//2022 with Dr Rayann Heman. 3/14/2022with Dr.Bensimhon.   Copy of ICM check sent to Dr.Allred  3 month ICM trend: 10/20/2020.    1 Year ICM trend:       [name], RN 10/22/2020 4:05 PM

## 2020-10-23 NOTE — Telephone Encounter (Signed)
Attempted to contact patient. Patient needs an in clinic appointment for [place] received on 10/20/20 for an RV lead impedence issue. Left message on patient's voicemail. Waiting on return call.

## 2020-10-26 NOTE — Progress Notes (Signed)
   Patient passed on 2020-10-25  Glori Bickers, MD  12:20 AM

## 2020-10-27 ENCOUNTER — Inpatient Hospital Stay (HOSPITAL_COMMUNITY): Admission: RE | Admit: 2020-10-27 | Payer: Medicare Other | Source: Ambulatory Visit | Admitting: Internal Medicine

## 2020-10-27 ENCOUNTER — Telehealth (HOSPITAL_COMMUNITY): Payer: Self-pay | Admitting: *Deleted

## 2020-10-27 NOTE — Telephone Encounter (Signed)
Received call from [facility] stating pt was found deceased at home and they will be faxing death certificate to our office for Dr Haroldine Laws to sign

## 2020-10-29 NOTE — Telephone Encounter (Signed)
Per EMR documentation, patient is noted to be deceased. All remote device check appointments have previously be cancelled. Patient has been removed from [place] and [place]. Spoke with sister Stephen Cardenas to offer condolences. Return kit requested to be sent to sisters home at [address], [place]  [address].

## 2020-11-03 ENCOUNTER — Encounter (HOSPITAL_COMMUNITY): Payer: Self-pay | Admitting: *Deleted

## 2020-11-03 NOTE — Progress Notes (Signed)
Received pt's DC from [facility] in [place], [place]. Form completed and signed by Dr Haroldine Laws, mailed back to [place] per their request

## 2020-11-14 DEATH — deceased

## 2020-12-19 ENCOUNTER — Encounter: Payer: Medicare Other | Admitting: Internal Medicine

## 2023-08-02 NOTE — Telephone Encounter (Signed)
This encounter was created in error - please disregard.
